# Patient Record
Sex: Female | Born: 1935 | Race: White | Hispanic: No | Smoking: Never smoker
Health system: Southern US, Community
[De-identification: ages and names within clinical notes are randomized; demographics above are authoritative.]

## PROBLEM LIST (undated history)

## (undated) DIAGNOSIS — I219 Acute myocardial infarction, unspecified: Secondary | ICD-10-CM

## (undated) DIAGNOSIS — C8583 Other specified types of non-Hodgkin lymphoma, intra-abdominal lymph nodes: Secondary | ICD-10-CM

## (undated) DIAGNOSIS — M1990 Unspecified osteoarthritis, unspecified site: Secondary | ICD-10-CM

## (undated) DIAGNOSIS — R011 Cardiac murmur, unspecified: Secondary | ICD-10-CM

## (undated) DIAGNOSIS — I499 Cardiac arrhythmia, unspecified: Secondary | ICD-10-CM

## (undated) DIAGNOSIS — I10 Essential (primary) hypertension: Secondary | ICD-10-CM

## (undated) HISTORY — PX: EYE SURGERY: SHX253

## (undated) HISTORY — DX: Other specified types of non-hodgkin lymphoma, intra-abdominal lymph nodes: C85.83

## (undated) HISTORY — PX: CARDIAC CATHETERIZATION: SHX172

## (undated) HISTORY — PX: TONSILLECTOMY: SUR1361

---

## 2012-10-22 DIAGNOSIS — K5790 Diverticulosis of intestine, part unspecified, without perforation or abscess without bleeding: Secondary | ICD-10-CM | POA: Insufficient documentation

## 2012-10-22 DIAGNOSIS — D509 Iron deficiency anemia, unspecified: Secondary | ICD-10-CM | POA: Insufficient documentation

## 2012-10-22 DIAGNOSIS — I4891 Unspecified atrial fibrillation: Secondary | ICD-10-CM | POA: Insufficient documentation

## 2012-10-22 DIAGNOSIS — C4491 Basal cell carcinoma of skin, unspecified: Secondary | ICD-10-CM | POA: Insufficient documentation

## 2012-10-22 DIAGNOSIS — I4819 Other persistent atrial fibrillation: Secondary | ICD-10-CM | POA: Insufficient documentation

## 2013-12-09 ENCOUNTER — Encounter: Payer: Self-pay | Admitting: Hematology & Oncology

## 2013-12-11 ENCOUNTER — Ambulatory Visit (HOSPITAL_BASED_OUTPATIENT_CLINIC_OR_DEPARTMENT_OTHER): Payer: PRIVATE HEALTH INSURANCE | Admitting: Family

## 2013-12-11 ENCOUNTER — Other Ambulatory Visit (HOSPITAL_BASED_OUTPATIENT_CLINIC_OR_DEPARTMENT_OTHER): Payer: PRIVATE HEALTH INSURANCE | Admitting: Lab

## 2013-12-11 ENCOUNTER — Encounter: Payer: Self-pay | Admitting: Family

## 2013-12-11 ENCOUNTER — Telehealth: Payer: Self-pay | Admitting: Hematology & Oncology

## 2013-12-11 ENCOUNTER — Ambulatory Visit: Payer: PRIVATE HEALTH INSURANCE

## 2013-12-11 VITALS — BP 101/66 | HR 97 | Temp 97.9°F | Resp 14 | Ht 60.0 in | Wt 109.0 lb

## 2013-12-11 DIAGNOSIS — C8590 Non-Hodgkin lymphoma, unspecified, unspecified site: Secondary | ICD-10-CM

## 2013-12-11 DIAGNOSIS — C8583 Other specified types of non-Hodgkin lymphoma, intra-abdominal lymph nodes: Secondary | ICD-10-CM

## 2013-12-11 DIAGNOSIS — C884 Extranodal marginal zone B-cell lymphoma of mucosa-associated lymphoid tissue [MALT-lymphoma]: Secondary | ICD-10-CM

## 2013-12-11 DIAGNOSIS — R599 Enlarged lymph nodes, unspecified: Secondary | ICD-10-CM

## 2013-12-11 HISTORY — DX: Other specified types of non-hodgkin lymphoma, intra-abdominal lymph nodes: C85.83

## 2013-12-11 LAB — CBC WITH DIFFERENTIAL (CANCER CENTER ONLY)
BASO#: 0 10*3/uL (ref 0.0–0.2)
BASO%: 0.5 % (ref 0.0–2.0)
EOS%: 5.1 % (ref 0.0–7.0)
Eosinophils Absolute: 0.3 10*3/uL (ref 0.0–0.5)
HEMATOCRIT: 45.7 % (ref 34.8–46.6)
HEMOGLOBIN: 14.9 g/dL (ref 11.6–15.9)
LYMPH#: 1.7 10*3/uL (ref 0.9–3.3)
LYMPH%: 27.1 % (ref 14.0–48.0)
MCH: 29.1 pg (ref 26.0–34.0)
MCHC: 32.6 g/dL (ref 32.0–36.0)
MCV: 89 fL (ref 81–101)
MONO#: 0.5 10*3/uL (ref 0.1–0.9)
MONO%: 7.4 % (ref 0.0–13.0)
NEUT#: 3.8 10*3/uL (ref 1.5–6.5)
NEUT%: 59.9 % (ref 39.6–80.0)
Platelets: 308 10*3/uL (ref 145–400)
RBC: 5.12 10*6/uL (ref 3.70–5.32)
RDW: 14.9 % (ref 11.1–15.7)
WBC: 6.3 10*3/uL (ref 3.9–10.0)

## 2013-12-11 LAB — CMP (CANCER CENTER ONLY)
ALBUMIN: 2.8 g/dL — AB (ref 3.3–5.5)
ALT(SGPT): 8 U/L — ABNORMAL LOW (ref 10–47)
AST: 30 U/L (ref 11–38)
Alkaline Phosphatase: 103 U/L — ABNORMAL HIGH (ref 26–84)
BILIRUBIN TOTAL: 0.6 mg/dL (ref 0.20–1.60)
BUN, Bld: 16 mg/dL (ref 7–22)
CO2: 27 mEq/L (ref 18–33)
Calcium: 8.7 mg/dL (ref 8.0–10.3)
Chloride: 100 mEq/L (ref 98–108)
Creat: 0.9 mg/dl (ref 0.6–1.2)
GLUCOSE: 93 mg/dL (ref 73–118)
Potassium: 4 mEq/L (ref 3.3–4.7)
SODIUM: 142 meq/L (ref 128–145)
TOTAL PROTEIN: 6.1 g/dL — AB (ref 6.4–8.1)

## 2013-12-11 MED ORDER — FAMCICLOVIR 500 MG PO TABS: 500.0000 mg | ORAL_TABLET | Freq: Every day | ORAL | Status: DC

## 2013-12-11 MED ORDER — ALLOPURINOL 100 MG PO TABS: 100.0000 mg | ORAL_TABLET | Freq: Every day | ORAL | Status: DC

## 2013-12-11 NOTE — Patient Instructions (Signed)
Rituximab injection What is this medicine? RITUXIMAB (ri TUX i mab) is a monoclonal antibody. This medicine changes the way the body's immune system works. It is used commonly to treat non-Hodgkin's lymphoma and other conditions. In cancer cells, this drug targets a specific protein within cancer cells and stops the cancer cells from growing. It is also used to treat rhuematoid arthritis (RA). In RA, this medicine slow the inflammatory process and help reduce joint pain and swelling. This medicine is often used with other cancer or arthritis medications. This medicine may be used for other purposes; ask your health care provider or pharmacist if you have questions. COMMON BRAND NAME(S): Rituxan What should I tell my health care provider before I take this medicine? They need to know if you have any of these conditions: -blood disorders -heart disease -history of hepatitis B -infection (especially a virus infection such as chickenpox, cold sores, or herpes) -irregular heartbeat -kidney disease -lung or breathing disease, like asthma -lupus -an unusual or allergic reaction to rituximab, mouse proteins, other medicines, foods, dyes, or preservatives -pregnant or trying to get pregnant -breast-feeding How should I use this medicine? This medicine is for infusion into a vein. It is administered in a hospital or clinic by a specially trained health care professional. A special MedGuide will be given to you by the pharmacist with each prescription and refill. Be sure to read this information carefully each time. Talk to your pediatrician regarding the use of this medicine in children. This medicine is not approved for use in children. Overdosage: If you think you have taken too much of this medicine contact a poison control center or emergency room at once. NOTE: This medicine is only for you. Do not share this medicine with others. What if I miss a dose? It is important not to miss a dose. Call  your doctor or health care professional if you are unable to keep an appointment. What may interact with this medicine? -cisplatin -medicines for blood pressure -some other medicines for arthritis -vaccines This list may not describe all possible interactions. Give your health care provider a list of all the medicines, herbs, non-prescription drugs, or dietary supplements you use. Also tell them if you smoke, drink alcohol, or use illegal drugs. Some items may interact with your medicine. What should I watch for while using this medicine? Report any side effects that you notice during your treatment right away, such as changes in your breathing, fever, chills, dizziness or lightheadedness. These effects are more common with the first dose. Visit your prescriber or health care professional for checks on your progress. You will need to have regular blood work. Report any other side effects. The side effects of this medicine can continue after you finish your treatment. Continue your course of treatment even though you feel ill unless your doctor tells you to stop. Call your doctor or health care professional for advice if you get a fever, chills or sore throat, or other symptoms of a cold or flu. Do not treat yourself. This drug decreases your body's ability to fight infections. Try to avoid being around people who are sick. This medicine may increase your risk to bruise or bleed. Call your doctor or health care professional if you notice any unusual bleeding. Be careful brushing and flossing your teeth or using a toothpick because you may get an infection or bleed more easily. If you have any dental work done, tell your dentist you are receiving this medicine. Avoid taking products   that contain aspirin, acetaminophen, ibuprofen, naproxen, or ketoprofen unless instructed by your doctor. These medicines may hide a fever. Do not become pregnant while taking this medicine. Women should inform their doctor  if they wish to become pregnant or think they might be pregnant. There is a potential for serious side effects to an unborn child. Talk to your health care professional or pharmacist for more information. Do not breast-feed an infant while taking this medicine. What side effects may I notice from receiving this medicine? Side effects that you should report to your doctor or health care professional as soon as possible: -allergic reactions like skin rash, itching or hives, swelling of the face, lips, or tongue -low blood counts - this medicine may decrease the number of white blood cells, red blood cells and platelets. You may be at increased risk for infections and bleeding. -signs of infection - fever or chills, cough, sore throat, pain or difficulty passing urine -signs of decreased platelets or bleeding - bruising, pinpoint red spots on the skin, black, tarry stools, blood in the urine -signs of decreased red blood cells - unusually weak or tired, fainting spells, lightheadedness -breathing problems -confused, not responsive -chest pain -fast, irregular heartbeat -feeling faint or lightheaded, falls -mouth sores -redness, blistering, peeling or loosening of the skin, including inside the mouth -stomach pain -swelling of the ankles, feet, or hands -trouble passing urine or change in the amount of urine Side effects that usually do not require medical attention (report to your doctor or other health care professional if they continue or are bothersome): -anxiety -headache -loss of appetite -muscle aches -nausea -night sweats This list may not describe all possible side effects. Call your doctor for medical advice about side effects. You may report side effects to FDA at 1-800-FDA-1088. Where should I keep my medicine? This drug is given in a hospital or clinic and will not be stored at home. NOTE: This sheet is a summary. It may not cover all possible information. If you have questions  about this medicine, talk to your doctor, pharmacist, or health care provider.  2015, Elsevier/Gold Standard. (2007-09-30 14:04:59) Bendamustine Injection What is this medicine? BENDAMUSTINE (BEN da MUS teen) is a chemotherapy drug. It is used to treat chronic lymphocytic leukemia and non-Hodgkin lymphoma. This medicine may be used for other purposes; ask your health care provider or pharmacist if you have questions. COMMON BRAND NAME(S): Treanda What should I tell my health care provider before I take this medicine? They need to know if you have any of these conditions: -kidney disease -liver disease -an unusual or allergic reaction to bendamustine, mannitol, other medicines, foods, dyes, or preservatives -pregnant or trying to get pregnant -breast-feeding How should I use this medicine? This medicine is for infusion into a vein. It is given by a health care professional in a hospital or clinic setting. Talk to your pediatrician regarding the use of this medicine in children. Special care may be needed. Overdosage: If you think you have taken too much of this medicine contact a poison control center or emergency room at once. NOTE: This medicine is only for you. Do not share this medicine with others. What if I miss a dose? It is important not to miss your dose. Call your doctor or health care professional if you are unable to keep an appointment. What may interact with this medicine? Do not take this medicine with any of the following medications: -clozapine This medicine may also interact with the following medications: -  atazanavir -cimetidine -ciprofloxacin -enoxacin -fluvoxamine -medicines for seizures like carbamazepine and phenobarbital -mexiletine -rifampin -tacrine -thiabendazole -zileuton This list may not describe all possible interactions. Give your health care provider a list of all the medicines, herbs, non-prescription drugs, or dietary supplements you use. Also  tell them if you smoke, drink alcohol, or use illegal drugs. Some items may interact with your medicine. What should I watch for while using this medicine? Your condition will be monitored carefully while you are receiving this medicine. This drug may make you feel generally unwell. This is not uncommon, as chemotherapy can affect healthy cells as well as cancer cells. Report any side effects. Continue your course of treatment even though you feel ill unless your doctor tells you to stop. Call your doctor or health care professional for advice if you get a fever, chills or sore throat, or other symptoms of a cold or flu. Do not treat yourself. This drug decreases your body's ability to fight infections. Try to avoid being around people who are sick. This medicine may increase your risk to bruise or bleed. Call your doctor or health care professional if you notice any unusual bleeding. Be careful brushing and flossing your teeth or using a toothpick because you may get an infection or bleed more easily. If you have any dental work done, tell your dentist you are receiving this medicine. Avoid taking products that contain aspirin, acetaminophen, ibuprofen, naproxen, or ketoprofen unless instructed by your doctor. These medicines may hide a fever. Do not become pregnant while taking this medicine. Women should inform their doctor if they wish to become pregnant or think they might be pregnant. There is a potential for serious side effects to an unborn child. Men should inform their doctors if they wish to father a child. This medicine may lower sperm counts. Talk to your health care professional or pharmacist for more information. Do not breast-feed an infant while taking this medicine. What side effects may I notice from receiving this medicine? Side effects that you should report to your doctor or health care professional as soon as possible: -allergic reactions like skin rash, itching or hives, swelling  of the face, lips, or tongue -low blood counts - this medicine may decrease the number of white blood cells, red blood cells and platelets. You may be at increased risk for infections and bleeding. -signs of infection - fever or chills, cough, sore throat, pain or difficulty passing urine -signs of decreased platelets or bleeding - bruising, pinpoint red spots on the skin, black, tarry stools, blood in the urine -signs of decreased red blood cells - unusually weak or tired, fainting spells, lightheadedness -trouble passing urine or change in the amount of urine Side effects that usually do not require medical attention (report to your doctor or health care professional if they continue or are bothersome): -diarrhea This list may not describe all possible side effects. Call your doctor for medical advice about side effects. You may report side effects to FDA at 1-800-FDA-1088. Where should I keep my medicine? This drug is given in a hospital or clinic and will not be stored at home. NOTE: This sheet is a summary. It may not cover all possible information. If you have questions about this medicine, talk to your doctor, pharmacist, or health care provider.  2015, Elsevier/Gold Standard. (2011-04-28 14:15:47)  

## 2013-12-11 NOTE — Telephone Encounter (Addendum)
I spoke w NEW PATIENT today to remind them of their appointment with Dr. Ennever. Also, advised them to bring all medication bottles and insurance card information. ° °

## 2013-12-11 NOTE — Progress Notes (Signed)
Hematology/Oncology Consultation   Name: Kathy Massey      MRN: [id number]    Location: Room/bed info not found  Date: 12/11/2013 Time:3:47 PM   REFERRING PHYSICIAN:  Raelene Bott  REASON FOR CONSULT:  Marginal Zone Lymphoma  DIAGNOSIS: Marginal Zone Non-Hodgkin Lymphoma  HISTORY OF PRESENT ILLNESS: Kathy Massey is a very pleasant 80 yo female with the resent diagnosis of lymphoma. She was at a regular appointment with her PCP and told him she had noticed a little mid abdominal pain. She had an ultrasound and then aCT to confirm abdominal and pelvic adenopathy and right sided hydronephrosis. She had a PET scan and we are waiting on images and report for this from the imaging center. She had a biopsy after this that confirmed the CD20+B Cell non-Hodgkin lymphoma. 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   She denies fever, chills, n/v, cough, rash, headache, SOB, chest pain, palpitations, abdominal pain, constipation, diarrhea, problems urinating, blood in urine or stool. She has hypertension and has dizziness at times related to that. Cardiology is following her for this. She swelling in her feet periodically and takes Lasix PRN which does help. She has no other swelling, pain, numbness or tingling in her extremities. Her appetite is good and she stays hydrated. Her weight is stable. She states that she is up to date on her mammograms and that they have been fine. She does not smoke. She has atrial fib and is on Eliquis for this. 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 She lives in [place]. Her husband passed away 2 years ago. She is here today with two of her children. They are all very sweet.    ROS: All other 10 point review of systems is negative.   PAST MEDICAL HISTORY:   Past Medical History  Diagnosis Date  . 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   No current facility-administered medications on file prior to visit.   PAST SURGICAL HISTORY No past surgical history on file.  FAMILY HISTORY: No family history on file.  SOCIAL HISTORY:  reports that she has never smoked. She has never used smokeless tobacco. Her alcohol and drug histories are not on file.  PERFORMANCE STATUS: The patient's performance status is 0 - Asymptomatic  PHYSICAL EXAM: Most Recent Vital Signs: Blood pressure 101/66, pulse 97, temperature 97.9 F (36.6 C), temperature source Oral, resp. rate 14, height 5' (1.524 m), weight 109 lb (49.442 kg). BP 101/66  Pulse 97  Temp(Src) 97.9 F (36.6 C) (Oral)  Resp 14  Ht 5' (1.524 m)  Wt 109 lb (49.442 kg)  BMI 21.29 kg/m2  General Appearance:    Alert, cooperative, no distress, appears stated age  Head:    Normocephalic, without obvious abnormality, atraumatic  Eyes:    PERRL, conjunctiva/corneas clear, EOM's intact, fundi    benign, both eyes        Throat:   Lips, mucosa, and tongue normal; teeth and gums normal  Neck:   Supple, symmetrical, trachea midline, no adenopathy;    thyroid:  no enlargement/tenderness/nodules; no carotid   bruit or JVD  Back:     Symmetric, no curvature, ROM normal, no CVA tenderness  Lungs:     Clear to auscultation bilaterally, respirations unlabored  Chest Wall:    No tenderness or deformity   Heart:  Regular rate and rhythm, S1 and S2 normal, no murmur, rub   or gallop     Abdomen:     Soft, non-tender, bowel sounds active all four quadrants,    no masses, no organomegaly        Extremities:   Extremities normal, atraumatic, no cyanosis or edema  Pulses:   2+ and symmetric all extremities  Skin:   Skin color, texture, turgor normal, no rashes or lesions  Lymph nodes:   Cervical, supraclavicular nodes normal, my have slight lymphedema of left axillary node  Neurologic:   CNII-XII intact, normal strength, sensation and reflexes    throughout    LABORATORY DATA:  Results for orders placed in visit on 12/11/13 (from the past 48 hour(s))  CBC WITH DIFFERENTIAL ([facility])     Status: None   Collection Time    12/11/13  1:12 PM      Result Value Ref Range   WBC 6.3  3.9 - 10.0 10e3/uL   RBC 5.12  3.70 - 5.32 10e6/uL   HGB 14.9  11.6 - 15.9 g/dL   HCT 45.7  34.8 - 46.6 %   MCV 89  81 - 101 fL   MCH 29.1  26.0 - 34.0 pg   MCHC 32.6  32.0 - 36.0 g/dL   RDW 14.9  11.1 - 15.7 %   Platelets 308  145 - 400 10e3/uL   NEUT# 3.8  1.5 - 6.5 10e3/uL   LYMPH# 1.7  0.9 - 3.3 10e3/uL   MONO# 0.5  0.1 - 0.9 10e3/uL   Eosinophils Absolute 0.3  0.0 - 0.5 10e3/uL   BASO# 0.0  0.0 - 0.2 10e3/uL   NEUT% 59.9  39.6 - 80.0 %   LYMPH% 27.1  14.0 - 48.0 %   MONO% 7.4  0.0 - 13.0 %   EOS% 5.1  0.0 - 7.0 %   BASO% 0.5  0.0 - 2.0 %  COMPREHENSIVE METABOLIC PANEL ([facility] ONLY)     Status: Abnormal   Collection Time    12/11/13  1:12 PM      Result Value Ref Range   Sodium 142  128 - 145 mEq/L   Potassium 4.0  3.3 - 4.7 mEq/L   Chloride 100  98 - 108 mEq/L   CO2 27  18 - 33 mEq/L   Glucose, Bld 93  73 - 118 mg/dL   BUN, Bld 16  7 - 22 mg/dL   Creat 0.9  0.6 - 1.2 mg/dl   Total Bilirubin 0.60  0.20 - 1.60 mg/dl   Alkaline Phosphatase 103 (*) 26 - 84 U/L   AST 30  11 - 38 U/L   ALT(SGPT) 8 (*) 10 - 47 U/L   Total Protein 6.1 (*) 6.4 - 8.1 g/dL   Albumin 2.8 (*) 3.3 - 5.5 g/dL   Calcium 8.7  8.0 - 10.3 mg/dL      RADIOGRAPHY: No results found.     PATHOLOGY: CD20+B Cell Non-Hodgkin Lymphoma (actual report in scanned documents 12/09/2013).   ASSESSMENT/PLAN: MS. Hord is a very pleasant 80 yo female with the resent diagnosis of CD20+B Cell non-Hodgkin lymphoma. She is asymptomatic at this time. She is no longer having the mid abdominal pain.  Her CBC today was normal.  We will set her up to start Treanda/Rituxan next week. She will have a total of 6-8 treatments with a repeat PET scan after her second treatment.    She will  be scheduled for  chemo class.  We will go ahead and start her on Famvir and Allopurinol today. She will get her treatment and appointment schedule.   All questions were answered. She knows to call the clinic with any problems, questions or concerns. We can certainly see her much sooner if necessary. The patient was discussed with and also seen by Dr. Marin Olp and he is in agreement with the aforementioned.   [facility]    Addendum:   I saw and examined the patient with Kierah Goatley. We spent about an hour with them. She came in with her son and daughter. I explained to them her diagnosis. She has marginal zone lymphoma. She has fairly bulky disease in the abdomen. This is a little unusual for marginal zone lymphoma. She apparently had a PET scan done. We do not have these results. From what the family says, there was low-level involvement.  Being that this is a low-grade lymphoma, I think that we can treat her very well with Rituxan/Treanda. I'll the she will need full dose Treanda given her age and the histology of the lymphoma.  I don't think a bone marrow biopsy is necessary as this really will not change our prognosis or management plans.  I explained the side effects of chemotherapy. I went over the hospital the of fatigue, nausea, diarrhea, risk of infection. Her blood counts may go down a little bit. However, I don't think that they should drop significantly that would increase her risk of bacterial infection.  I think that Rituxan/Treanda she have a 90% effectiveness rate. After her second cycle of treatment, I would do a PET scan to see what type of response she has had.  She definitely needs the allopurinol and Famvir. Output are on 100 mg a day of allopurinol for 21 days. I would put her on 500 mg a day of Famvir.  I looked at her blood smear. I do not see anything that looked unusual. There were no nuclear red cells. There were no immature myeloid cells. There were no atypical  lymphocytes.  She has great peripheral veins so she does not need a Port-A-Cath.  On her exam, she has some fullness in the left axilla. There is no cervical or supraclavicular lymph nodes. I cannot palpate any lymph nodes in the right axilla. She had clear lungs. Cardiac exam is regular rate and rhythm. I cannot detect atrial fibrillation. Her abdominal exam was soft. There is no splenomegaly..I cannot palpate her liver tip. Extremities shows some mild, chronic nonpitting edema.  We spent an hour with Mrs. Angus in her family. I answered all their questions. We gave her a prayer blanket.  We will get started with treatment next week.

## 2013-12-12 ENCOUNTER — Encounter: Payer: Self-pay | Admitting: Hematology & Oncology

## 2013-12-15 ENCOUNTER — Other Ambulatory Visit: Payer: PRIVATE HEALTH INSURANCE

## 2013-12-15 LAB — BETA 2 MICROGLOBULIN, SERUM: Beta-2 Microglobulin: 4.25 mg/L — ABNORMAL HIGH (ref <=2.51)

## 2013-12-15 LAB — LACTATE DEHYDROGENASE: LDH: 174 U/L (ref 94–250)

## 2013-12-17 ENCOUNTER — Ambulatory Visit (HOSPITAL_BASED_OUTPATIENT_CLINIC_OR_DEPARTMENT_OTHER): Payer: PRIVATE HEALTH INSURANCE

## 2013-12-17 DIAGNOSIS — Z5111 Encounter for antineoplastic chemotherapy: Secondary | ICD-10-CM

## 2013-12-17 DIAGNOSIS — C8583 Other specified types of non-Hodgkin lymphoma, intra-abdominal lymph nodes: Secondary | ICD-10-CM

## 2013-12-17 DIAGNOSIS — Z5112 Encounter for antineoplastic immunotherapy: Secondary | ICD-10-CM

## 2013-12-17 DIAGNOSIS — C8518 Unspecified B-cell lymphoma, lymph nodes of multiple sites: Secondary | ICD-10-CM

## 2013-12-17 MED ORDER — ACETAMINOPHEN 325 MG PO TABS
ORAL_TABLET | ORAL | Status: AC
  Filled 2013-12-17: qty 2

## 2013-12-17 MED ORDER — SODIUM CHLORIDE 0.9 % IV SOLN
375.0000 mg/m2 | Freq: Once | INTRAVENOUS | Status: AC
  Administered 2013-12-17: 500 mg via INTRAVENOUS
  Filled 2013-12-17: qty 50

## 2013-12-17 MED ORDER — PROCHLORPERAZINE MALEATE 10 MG PO TABS: 10.0000 mg | ORAL_TABLET | Freq: Four times a day (QID) | ORAL | Status: DC | PRN

## 2013-12-17 MED ORDER — DEXAMETHASONE 4 MG PO TABS: 8.0000 mg | ORAL_TABLET | Freq: Two times a day (BID) | ORAL | Status: DC

## 2013-12-17 MED ORDER — DEXAMETHASONE SODIUM PHOSPHATE 10 MG/ML IJ SOLN
INTRAMUSCULAR | Status: AC
  Filled 2013-12-17: qty 1

## 2013-12-17 MED ORDER — DIPHENHYDRAMINE HCL 25 MG PO CAPS
50.0000 mg | ORAL_CAPSULE | Freq: Once | ORAL | Status: AC
  Administered 2013-12-17: 50 mg via ORAL

## 2013-12-17 MED ORDER — SODIUM CHLORIDE 0.9 % IV SOLN
70.0000 mg/m2 | Freq: Once | INTRAVENOUS | Status: AC
  Administered 2013-12-17: 99 mg via INTRAVENOUS
  Filled 2013-12-17: qty 1.1

## 2013-12-17 MED ORDER — DEXAMETHASONE SODIUM PHOSPHATE 10 MG/ML IJ SOLN
10.0000 mg | Freq: Once | INTRAMUSCULAR | Status: AC
  Administered 2013-12-17: 10 mg via INTRAVENOUS

## 2013-12-17 MED ORDER — ONDANSETRON HCL 8 MG PO TABS: 8.0000 mg | ORAL_TABLET | Freq: Two times a day (BID) | ORAL | Status: DC

## 2013-12-17 MED ORDER — ONDANSETRON 8 MG/50ML IVPB (CHCC)
8.0000 mg | Freq: Once | INTRAVENOUS | Status: AC
  Administered 2013-12-17: 8 mg via INTRAVENOUS

## 2013-12-17 MED ORDER — ACETAMINOPHEN 325 MG PO TABS
650.0000 mg | ORAL_TABLET | Freq: Once | ORAL | Status: AC
  Administered 2013-12-17: 650 mg via ORAL

## 2013-12-17 MED ORDER — DIPHENHYDRAMINE HCL 25 MG PO CAPS
ORAL_CAPSULE | ORAL | Status: AC
  Filled 2013-12-17: qty 2

## 2013-12-17 MED ORDER — SODIUM CHLORIDE 0.9 % IV SOLN
Freq: Once | INTRAVENOUS | Status: AC
  Administered 2013-12-17: 09:00:00 via INTRAVENOUS

## 2013-12-17 NOTE — Patient Instructions (Signed)
[facility] Discharge Instructions for Patients Receiving Chemotherapy  Today you received the following chemotherapy agents Rituxan and Treanda.   To help prevent nausea and vomiting after your treatment, we encourage you to take your nausea medications as directed.   If you develop nausea and vomiting that is not controlled by your nausea medication, call the clinic.   BELOW ARE SYMPTOMS THAT SHOULD BE REPORTED IMMEDIATELY:  *FEVER GREATER THAN 100.5 F  *CHILLS WITH OR WITHOUT FEVER  NAUSEA AND VOMITING THAT IS NOT CONTROLLED WITH YOUR NAUSEA MEDICATION  *UNUSUAL SHORTNESS OF BREATH  *UNUSUAL BRUISING OR BLEEDING  TENDERNESS IN MOUTH AND THROAT WITH OR WITHOUT PRESENCE OF ULCERS  *URINARY PROBLEMS  *BOWEL PROBLEMS  UNUSUAL RASH Items with * indicate a potential emergency and should be followed up as soon as possible.  Feel free to call the clinic you have any questions or concerns. The clinic phone number is [phone] Bendamustine Injection What is this medicine? BENDAMUSTINE (BEN da MUS teen) is a chemotherapy drug. It is used to treat chronic lymphocytic leukemia and non-Hodgkin lymphoma. This medicine may be used for other purposes; ask your health care provider or pharmacist if you have questions. COMMON BRAND NAME(S): Treanda What should I tell my health care provider before I take this medicine? They need to know if you have any of these conditions: -kidney disease -liver disease -an unusual or allergic reaction to bendamustine, mannitol, other medicines, foods, dyes, or preservatives -pregnant or trying to get pregnant -breast-feeding How should I use this medicine? This medicine is for infusion into a vein. It is given by a health care professional in a hospital or clinic setting. Talk to your pediatrician regarding the use of this medicine in children. Special care may be needed. Overdosage: If you think you have taken too much of this  medicine contact a poison control center or emergency room at once. NOTE: This medicine is only for you. Do not share this medicine with others. What if I miss a dose? It is important not to miss your dose. Call your doctor or health care professional if you are unable to keep an appointment. What may interact with this medicine? Do not take this medicine with any of the following medications: -clozapine This medicine may also interact with the following medications: -atazanavir -cimetidine -ciprofloxacin -enoxacin -fluvoxamine -medicines for seizures like carbamazepine and phenobarbital -mexiletine -rifampin -tacrine -thiabendazole -zileuton This list may not describe all possible interactions. Give your health care provider a list of all the medicines, herbs, non-prescription drugs, or dietary supplements you use. Also tell them if you smoke, drink alcohol, or use illegal drugs. Some items may interact with your medicine. What should I watch for while using this medicine? Your condition will be monitored carefully while you are receiving this medicine. This drug may make you feel generally unwell. This is not uncommon, as chemotherapy can affect healthy cells as well as cancer cells. Report any side effects. Continue your course of treatment even though you feel ill unless your doctor tells you to stop. Call your doctor or health care professional for advice if you get a fever, chills or sore throat, or other symptoms of a cold or flu. Do not treat yourself. This drug decreases your body's ability to fight infections. Try to avoid being around people who are sick. This medicine may increase your risk to bruise or bleed. Call your doctor or health care professional if you notice any unusual bleeding. Be careful brushing  and flossing your teeth or using a toothpick because you may get an infection or bleed more easily. If you have any dental work done, tell your dentist you are receiving  this medicine. Avoid taking products that contain aspirin, acetaminophen, ibuprofen, naproxen, or ketoprofen unless instructed by your doctor. These medicines may hide a fever. Do not become pregnant while taking this medicine. Women should inform their doctor if they wish to become pregnant or think they might be pregnant. There is a potential for serious side effects to an unborn child. Men should inform their doctors if they wish to father a child. This medicine may lower sperm counts. Talk to your health care professional or pharmacist for more information. Do not breast-feed an infant while taking this medicine. What side effects may I notice from receiving this medicine? Side effects that you should report to your doctor or health care professional as soon as possible: -allergic reactions like skin rash, itching or hives, swelling of the face, lips, or tongue -low blood counts - this medicine may decrease the number of white blood cells, red blood cells and platelets. You may be at increased risk for infections and bleeding. -signs of infection - fever or chills, cough, sore throat, pain or difficulty passing urine -signs of decreased platelets or bleeding - bruising, pinpoint red spots on the skin, black, tarry stools, blood in the urine -signs of decreased red blood cells - unusually weak or tired, fainting spells, lightheadedness -trouble passing urine or change in the amount of urine Side effects that usually do not require medical attention (report to your doctor or health care professional if they continue or are bothersome): -diarrhea This list may not describe all possible side effects. Call your doctor for medical advice about side effects. You may report side effects to FDA at 1-800-FDA-1088. Where should I keep my medicine? This drug is given in a hospital or clinic and will not be stored at home. NOTE: This sheet is a summary. It may not cover all possible information. If you have  questions about this medicine, talk to your doctor, pharmacist, or health care provider.  2015, Elsevier/Gold Standard. (2011-04-28 14:15:47) Rituximab injection What is this medicine? RITUXIMAB (ri TUX i mab) is a monoclonal antibody. This medicine changes the way the body's immune system works. It is used commonly to treat non-Hodgkin's lymphoma and other conditions. In cancer cells, this drug targets a specific protein within cancer cells and stops the cancer cells from growing. It is also used to treat rhuematoid arthritis (RA). In RA, this medicine slow the inflammatory process and help reduce joint pain and swelling. This medicine is often used with other cancer or arthritis medications. This medicine may be used for other purposes; ask your health care provider or pharmacist if you have questions. COMMON BRAND NAME(S): Rituxan What should I tell my health care provider before I take this medicine? They need to know if you have any of these conditions: -blood disorders -heart disease -history of hepatitis B -infection (especially a virus infection such as chickenpox, cold sores, or herpes) -irregular heartbeat -kidney disease -lung or breathing disease, like asthma -lupus -an unusual or allergic reaction to rituximab, mouse proteins, other medicines, foods, dyes, or preservatives -pregnant or trying to get pregnant -breast-feeding How should I use this medicine? This medicine is for infusion into a vein. It is administered in a hospital or clinic by a specially trained health care professional. A special MedGuide will be given to you by the  pharmacist with each prescription and refill. Be sure to read this information carefully each time. Talk to your pediatrician regarding the use of this medicine in children. This medicine is not approved for use in children. Overdosage: If you think you have taken too much of this medicine contact a poison control center or emergency room at  once. NOTE: This medicine is only for you. Do not share this medicine with others. What if I miss a dose? It is important not to miss a dose. Call your doctor or health care professional if you are unable to keep an appointment. What may interact with this medicine? -cisplatin -medicines for blood pressure -some other medicines for arthritis -vaccines This list may not describe all possible interactions. Give your health care provider a list of all the medicines, herbs, non-prescription drugs, or dietary supplements you use. Also tell them if you smoke, drink alcohol, or use illegal drugs. Some items may interact with your medicine. What should I watch for while using this medicine? Report any side effects that you notice during your treatment right away, such as changes in your breathing, fever, chills, dizziness or lightheadedness. These effects are more common with the first dose. Visit your prescriber or health care professional for checks on your progress. You will need to have regular blood work. Report any other side effects. The side effects of this medicine can continue after you finish your treatment. Continue your course of treatment even though you feel ill unless your doctor tells you to stop. Call your doctor or health care professional for advice if you get a fever, chills or sore throat, or other symptoms of a cold or flu. Do not treat yourself. This drug decreases your body's ability to fight infections. Try to avoid being around people who are sick. This medicine may increase your risk to bruise or bleed. Call your doctor or health care professional if you notice any unusual bleeding. Be careful brushing and flossing your teeth or using a toothpick because you may get an infection or bleed more easily. If you have any dental work done, tell your dentist you are receiving this medicine. Avoid taking products that contain aspirin, acetaminophen, ibuprofen, naproxen, or ketoprofen  unless instructed by your doctor. These medicines may hide a fever. Do not become pregnant while taking this medicine. Women should inform their doctor if they wish to become pregnant or think they might be pregnant. There is a potential for serious side effects to an unborn child. Talk to your health care professional or pharmacist for more information. Do not breast-feed an infant while taking this medicine. What side effects may I notice from receiving this medicine? Side effects that you should report to your doctor or health care professional as soon as possible: -allergic reactions like skin rash, itching or hives, swelling of the face, lips, or tongue -low blood counts - this medicine may decrease the number of white blood cells, red blood cells and platelets. You may be at increased risk for infections and bleeding. -signs of infection - fever or chills, cough, sore throat, pain or difficulty passing urine -signs of decreased platelets or bleeding - bruising, pinpoint red spots on the skin, black, tarry stools, blood in the urine -signs of decreased red blood cells - unusually weak or tired, fainting spells, lightheadedness -breathing problems -confused, not responsive -chest pain -fast, irregular heartbeat -feeling faint or lightheaded, falls -mouth sores -redness, blistering, peeling or loosening of the skin, including inside the mouth -stomach pain -  swelling of the ankles, feet, or hands -trouble passing urine or change in the amount of urine Side effects that usually do not require medical attention (report to your doctor or other health care professional if they continue or are bothersome): -anxiety -headache -loss of appetite -muscle aches -nausea -night sweats This list may not describe all possible side effects. Call your doctor for medical advice about side effects. You may report side effects to FDA at 1-800-FDA-1088. Where should I keep my medicine? This drug is given  in a hospital or clinic and will not be stored at home. NOTE: This sheet is a summary. It may not cover all possible information. If you have questions about this medicine, talk to your doctor, pharmacist, or health care provider.  2015, Elsevier/Gold Standard. (2007-09-30 14:04:59) .

## 2013-12-18 ENCOUNTER — Encounter: Payer: Self-pay | Admitting: Hematology & Oncology

## 2013-12-18 ENCOUNTER — Other Ambulatory Visit: Payer: Self-pay | Admitting: *Deleted

## 2013-12-18 ENCOUNTER — Ambulatory Visit (HOSPITAL_BASED_OUTPATIENT_CLINIC_OR_DEPARTMENT_OTHER): Payer: PRIVATE HEALTH INSURANCE

## 2013-12-18 DIAGNOSIS — C8518 Unspecified B-cell lymphoma, lymph nodes of multiple sites: Secondary | ICD-10-CM

## 2013-12-18 DIAGNOSIS — Z5111 Encounter for antineoplastic chemotherapy: Secondary | ICD-10-CM

## 2013-12-18 DIAGNOSIS — C8583 Other specified types of non-Hodgkin lymphoma, intra-abdominal lymph nodes: Secondary | ICD-10-CM

## 2013-12-18 MED ORDER — DEXAMETHASONE SODIUM PHOSPHATE 10 MG/ML IJ SOLN
10.0000 mg | Freq: Once | INTRAMUSCULAR | Status: AC
  Administered 2013-12-18: 10 mg via INTRAVENOUS

## 2013-12-18 MED ORDER — MIRTAZAPINE 30 MG PO TABS: 15.0000 mg | ORAL_TABLET | Freq: Every evening | ORAL | Status: DC | PRN

## 2013-12-18 MED ORDER — SODIUM CHLORIDE 0.9 % IV SOLN
70.0000 mg/m2 | Freq: Once | INTRAVENOUS | Status: AC
  Administered 2013-12-18: 99 mg via INTRAVENOUS
  Filled 2013-12-18: qty 1.1

## 2013-12-18 MED ORDER — SODIUM CHLORIDE 0.9 % IV SOLN
Freq: Once | INTRAVENOUS | Status: AC
  Administered 2013-12-18: 11:00:00 via INTRAVENOUS

## 2013-12-18 MED ORDER — ONDANSETRON 8 MG/50ML IVPB (CHCC)
8.0000 mg | Freq: Once | INTRAVENOUS | Status: AC
  Administered 2013-12-18: 8 mg via INTRAVENOUS

## 2013-12-18 MED ORDER — DEXAMETHASONE SODIUM PHOSPHATE 10 MG/ML IJ SOLN
INTRAMUSCULAR | Status: AC
  Filled 2013-12-18: qty 1

## 2013-12-18 NOTE — Patient Instructions (Signed)
[facility] Discharge Instructions for Patients Receiving Chemotherapy  Today you received the following chemotherapy agents Treanda.  To help prevent nausea and vomiting after your treatment, we encourage you to take your nausea medication.   If you develop nausea and vomiting that is not controlled by your nausea medication, call the clinic.   BELOW ARE SYMPTOMS THAT SHOULD BE REPORTED IMMEDIATELY:  *FEVER GREATER THAN 100.5 F  *CHILLS WITH OR WITHOUT FEVER  NAUSEA AND VOMITING THAT IS NOT CONTROLLED WITH YOUR NAUSEA MEDICATION  *UNUSUAL SHORTNESS OF BREATH  *UNUSUAL BRUISING OR BLEEDING  TENDERNESS IN MOUTH AND THROAT WITH OR WITHOUT PRESENCE OF ULCERS  *URINARY PROBLEMS  *BOWEL PROBLEMS  UNUSUAL RASH Items with * indicate a potential emergency and should be followed up as soon as possible.  Feel free to call the clinic you have any questions or concerns. The clinic phone number is [phone].    

## 2014-01-07 ENCOUNTER — Other Ambulatory Visit: Payer: Self-pay | Admitting: *Deleted

## 2014-01-14 ENCOUNTER — Encounter: Payer: Self-pay | Admitting: Hematology & Oncology

## 2014-01-14 ENCOUNTER — Telehealth: Payer: Self-pay | Admitting: Hematology & Oncology

## 2014-01-14 ENCOUNTER — Ambulatory Visit (HOSPITAL_BASED_OUTPATIENT_CLINIC_OR_DEPARTMENT_OTHER): Payer: PRIVATE HEALTH INSURANCE | Admitting: Hematology & Oncology

## 2014-01-14 ENCOUNTER — Ambulatory Visit (HOSPITAL_BASED_OUTPATIENT_CLINIC_OR_DEPARTMENT_OTHER): Payer: PRIVATE HEALTH INSURANCE

## 2014-01-14 ENCOUNTER — Other Ambulatory Visit (HOSPITAL_BASED_OUTPATIENT_CLINIC_OR_DEPARTMENT_OTHER): Payer: PRIVATE HEALTH INSURANCE | Admitting: Lab

## 2014-01-14 VITALS — BP 120/91 | HR 111 | Temp 97.5°F | Resp 14 | Ht 60.0 in | Wt 110.0 lb

## 2014-01-14 DIAGNOSIS — Z5111 Encounter for antineoplastic chemotherapy: Secondary | ICD-10-CM

## 2014-01-14 DIAGNOSIS — C8583 Other specified types of non-Hodgkin lymphoma, intra-abdominal lymph nodes: Secondary | ICD-10-CM

## 2014-01-14 DIAGNOSIS — C8590 Non-Hodgkin lymphoma, unspecified, unspecified site: Secondary | ICD-10-CM

## 2014-01-14 DIAGNOSIS — Z5112 Encounter for antineoplastic immunotherapy: Secondary | ICD-10-CM

## 2014-01-14 LAB — CMP (CANCER CENTER ONLY)
ALT(SGPT): 9 U/L — ABNORMAL LOW (ref 10–47)
AST: 26 U/L (ref 11–38)
Albumin: 3 g/dL — ABNORMAL LOW (ref 3.3–5.5)
Alkaline Phosphatase: 86 U/L — ABNORMAL HIGH (ref 26–84)
BUN, Bld: 18 mg/dL (ref 7–22)
CHLORIDE: 108 meq/L (ref 98–108)
CO2: 27 meq/L (ref 18–33)
CREATININE: 1.1 mg/dL (ref 0.6–1.2)
Calcium: 8.9 mg/dL (ref 8.0–10.3)
Glucose, Bld: 109 mg/dL (ref 73–118)
Potassium: 4.2 mEq/L (ref 3.3–4.7)
Sodium: 143 mEq/L (ref 128–145)
Total Bilirubin: 0.5 mg/dl (ref 0.20–1.60)
Total Protein: 6.5 g/dL (ref 6.4–8.1)

## 2014-01-14 LAB — CBC WITH DIFFERENTIAL (CANCER CENTER ONLY)
BASO#: 0.1 10*3/uL (ref 0.0–0.2)
BASO%: 1.5 % (ref 0.0–2.0)
EOS ABS: 0.2 10*3/uL (ref 0.0–0.5)
EOS%: 5.2 % (ref 0.0–7.0)
HCT: 44.1 % (ref 34.8–46.6)
HGB: 14.6 g/dL (ref 11.6–15.9)
LYMPH#: 0.8 10*3/uL — ABNORMAL LOW (ref 0.9–3.3)
LYMPH%: 20.1 % (ref 14.0–48.0)
MCH: 29.6 pg (ref 26.0–34.0)
MCHC: 33.1 g/dL (ref 32.0–36.0)
MCV: 90 fL (ref 81–101)
MONO#: 0.4 10*3/uL (ref 0.1–0.9)
MONO%: 9.1 % (ref 0.0–13.0)
NEUT#: 2.6 10*3/uL (ref 1.5–6.5)
NEUT%: 64.1 % (ref 39.6–80.0)
PLATELETS: 299 10*3/uL (ref 145–400)
RBC: 4.93 10*6/uL (ref 3.70–5.32)
RDW: 14.9 % (ref 11.1–15.7)
WBC: 4.1 10*3/uL (ref 3.9–10.0)

## 2014-01-14 LAB — LACTATE DEHYDROGENASE: LDH: 150 U/L (ref 94–250)

## 2014-01-14 LAB — URIC ACID: Uric Acid, Serum: 5.3 mg/dL (ref 2.4–7.0)

## 2014-01-14 MED ORDER — DIPHENHYDRAMINE HCL 25 MG PO CAPS
ORAL_CAPSULE | ORAL | Status: AC
  Filled 2014-01-14: qty 2

## 2014-01-14 MED ORDER — DIPHENHYDRAMINE HCL 25 MG PO CAPS
50.0000 mg | ORAL_CAPSULE | Freq: Once | ORAL | Status: AC
  Administered 2014-01-14: 50 mg via ORAL

## 2014-01-14 MED ORDER — DEXAMETHASONE SODIUM PHOSPHATE 10 MG/ML IJ SOLN
10.0000 mg | Freq: Once | INTRAMUSCULAR | Status: AC
  Administered 2014-01-14: 10 mg via INTRAVENOUS

## 2014-01-14 MED ORDER — ACETAMINOPHEN 325 MG PO TABS
650.0000 mg | ORAL_TABLET | Freq: Once | ORAL | Status: AC
  Administered 2014-01-14: 650 mg via ORAL

## 2014-01-14 MED ORDER — SODIUM CHLORIDE 0.9 % IV SOLN
375.0000 mg/m2 | Freq: Once | INTRAVENOUS | Status: AC
  Administered 2014-01-14: 500 mg via INTRAVENOUS
  Filled 2014-01-14: qty 50

## 2014-01-14 MED ORDER — ACETAMINOPHEN 325 MG PO TABS
ORAL_TABLET | ORAL | Status: AC
  Filled 2014-01-14: qty 2

## 2014-01-14 MED ORDER — SODIUM CHLORIDE 0.9 % IV SOLN
70.0000 mg/m2 | Freq: Once | INTRAVENOUS | Status: AC
  Administered 2014-01-14: 99 mg via INTRAVENOUS
  Filled 2014-01-14: qty 1.1

## 2014-01-14 MED ORDER — ONDANSETRON 8 MG/50ML IVPB (CHCC)
8.0000 mg | Freq: Once | INTRAVENOUS | Status: AC
  Administered 2014-01-14: 8 mg via INTRAVENOUS

## 2014-01-14 MED ORDER — DEXAMETHASONE SODIUM PHOSPHATE 10 MG/ML IJ SOLN
INTRAMUSCULAR | Status: AC
  Filled 2014-01-14: qty 1

## 2014-01-14 MED ORDER — SODIUM CHLORIDE 0.9 % IV SOLN
Freq: Once | INTRAVENOUS | Status: AC
  Administered 2014-01-14: 10:00:00 via INTRAVENOUS

## 2014-01-14 NOTE — Progress Notes (Signed)
Hematology and Oncology Follow Up Visit  Kyisha Fowle [id number] April 24, 1927 80 y.o. 01/14/2014   Principle Diagnosis:   Marginal zone lymphoma  Current Therapy:    Status post cycle 1 of Rituxan/Treanda     Interim History:  Ms.  Liddicoat is back for follow-up. She had he tolerated her first cycle quite well. She still has some abdominal discomfort. I think this might be more related to the treatment then the actual lymphoma. I suspect that her lymphoma should be improving nicely.  She's had very little, if any, of nausea vomiting. She does have atrial fibrillation. She is on ELIQUIS for this. There has been no bleeding. She's had no diarrhea. She's had no leg swelling. She has numerous skin lesions which look like squamous cell or basal cell carcinomas. I told her that she can have these taken off at any time.  There's been no fever. She's had no cough. There is no increased shortness of breath.  Medications: Current outpatient prescriptions: apixaban (ELIQUIS) 2.5 MG TABS tablet, Take 2.5 mg by mouth 2 (two) times daily. , Disp: , Rfl: ;  dexamethasone (DECADRON) 4 MG tablet, Take 2 tablets (8 mg total) by mouth 2 (two) times daily with a meal. Start the day after chemotherapy for 2 days. Take with food., Disp: 30 tablet, Rfl: 1;  famciclovir (FAMVIR) 500 MG tablet, Take 1 tablet (500 mg total) by mouth daily., Disp: 30 tablet, Rfl: 6 metoprolol succinate (TOPROL-XL) 25 MG 24 hr tablet, Take 25 mg by mouth 2 (two) times daily. , Disp: , Rfl: ;  mirtazapine (REMERON) 30 MG tablet, Take 0.5 tablets (15 mg total) by mouth at bedtime as needed., Disp: 5 tablet, Rfl: 0;  Multiple Vitamin (MULTI-VITAMINS) TABS, Take by mouth every morning. , Disp: , Rfl: ;  omeprazole (PRILOSEC) 40 MG capsule, Take 40 mg by mouth daily. PRN, Disp: , Rfl:  ondansetron (ZOFRAN) 8 MG tablet, Take 1 tablet (8 mg total) by mouth 2 (two) times daily. Start the day after chemo for 2 days. Then take as needed for nausea or  vomiting., Disp: 30 tablet, Rfl: 1;  prochlorperazine (COMPAZINE) 10 MG tablet, Take 1 tablet (10 mg total) by mouth every 6 (six) hours as needed (Nausea or vomiting)., Disp: 30 tablet, Rfl: 1 allopurinol (ZYLOPRIM) 100 MG tablet, Take 1 tablet (100 mg total) by mouth daily. (Patient not taking: Reported on 01/14/2014), Disp: 21 tablet, Rfl: 0;  ferrous sulfate 325 (65 FE) MG tablet, Take 325 mg by mouth daily with breakfast. , Disp: , Rfl:   Allergies: No Known Allergies  Past Medical History, Surgical history, Social history, and Family History were reviewed and updated.  Review of Systems: As above  Physical Exam:  height is 5' (1.524 m) and weight is 110 lb (49.896 kg). Her oral temperature is 97.5 F (36.4 C). Her blood pressure is 120/91 and her pulse is 111. Her respiration is 14.   Elderly, petite white female in no obvious distress. Head and neck exam shows no ocular or oral lesions. There are no palpable cervical or supraclavicular lymph nodes. Lungs are clear. Cardiac exam tachycardic and irregular, consistent with atrophic fibrillation. There are no murmurs, rubs or bruits. Abdomen is soft. She has good bowel sounds. There is no fluid wave. There is no palpable abdominal mass. There is no palpable liver or spleen tip. Back exam shows no tenderness over the spine, ribs or hips. She has some slight kyphosis. Extremities shows no clubbing, cyanosis or edema. She  has age related osteo-arthritic changes in her joints. She has decent strength in her extremities. Skin exam shows scattered areas that appear to be consistent with squamous cell or basal cell carcinomas. Neurological exam is nonfocal.  Lab Results  Component Value Date   WBC 4.1 01/14/2014   HGB 14.6 01/14/2014   HCT 44.1 01/14/2014   MCV 90 01/14/2014   PLT 299 01/14/2014     Chemistry      Component Value Date/Time   NA 142 12/11/2013 1312   K 4.0 12/11/2013 1312   CL 100 12/11/2013 1312   CO2 27 12/11/2013 1312    BUN 16 12/11/2013 1312   CREATININE 0.9 12/11/2013 1312      Component Value Date/Time   CALCIUM 8.7 12/11/2013 1312   ALKPHOS 103* 12/11/2013 1312   AST 30 12/11/2013 1312   ALT 8* 12/11/2013 1312   BILITOT 0.60 12/11/2013 1312         Impression and Plan: Ms. Bucks is 80 year old white female. She has marginal zone lymphoma. She has abdominal disease that is fairly extensive.  We will proceed with her second cycle of treatment. I will then set her up with a PET scan. We will do this right after Christmas.  One of her granddaughters came in. She was very nice and very helpful.  We'll have this Haupt come back after New Year for treatment.   I will like to think that if this scan was good, then we can just go with 6 cycles of treatment. She may need maintenance Rituxan but we will decide this later.  [name], MD 12/2/20159:33 AM

## 2014-01-14 NOTE — Telephone Encounter (Signed)
Schedule 12-29 PET at [facility] with Della at 10 am. Pt aware, and to be NPO 6 hrs

## 2014-01-14 NOTE — Patient Instructions (Signed)
[facility] Discharge Instructions for Patients Receiving Chemotherapy  Today you received the following chemotherapy agents Rituxan and Treanda. To help prevent nausea and vomiting after your treatment, we encourage you to take your nausea medication as prescribed.   If you develop nausea and vomiting that is not controlled by your nausea medication, call the clinic.   BELOW ARE SYMPTOMS THAT SHOULD BE REPORTED IMMEDIATELY:  *FEVER GREATER THAN 100.5 F  *CHILLS WITH OR WITHOUT FEVER  NAUSEA AND VOMITING THAT IS NOT CONTROLLED WITH YOUR NAUSEA MEDICATION  *UNUSUAL SHORTNESS OF BREATH  *UNUSUAL BRUISING OR BLEEDING  TENDERNESS IN MOUTH AND THROAT WITH OR WITHOUT PRESENCE OF ULCERS  *URINARY PROBLEMS  *BOWEL PROBLEMS  UNUSUAL RASH Items with * indicate a potential emergency and should be followed up as soon as possible.  Feel free to call the clinic you have any questions or concerns. The clinic phone number is [phone].    

## 2014-01-15 ENCOUNTER — Ambulatory Visit (HOSPITAL_BASED_OUTPATIENT_CLINIC_OR_DEPARTMENT_OTHER): Payer: PRIVATE HEALTH INSURANCE

## 2014-01-15 ENCOUNTER — Ambulatory Visit: Payer: PRIVATE HEALTH INSURANCE

## 2014-01-15 DIAGNOSIS — Z5111 Encounter for antineoplastic chemotherapy: Secondary | ICD-10-CM

## 2014-01-15 DIAGNOSIS — C8518 Unspecified B-cell lymphoma, lymph nodes of multiple sites: Secondary | ICD-10-CM

## 2014-01-15 DIAGNOSIS — C8583 Other specified types of non-Hodgkin lymphoma, intra-abdominal lymph nodes: Secondary | ICD-10-CM

## 2014-01-15 MED ORDER — ONDANSETRON 8 MG/50ML IVPB (CHCC)
8.0000 mg | Freq: Once | INTRAVENOUS | Status: AC
  Administered 2014-01-15: 8 mg via INTRAVENOUS

## 2014-01-15 MED ORDER — SODIUM CHLORIDE 0.9 % IV SOLN
Freq: Once | INTRAVENOUS | Status: AC
  Administered 2014-01-15: 11:00:00 via INTRAVENOUS

## 2014-01-15 MED ORDER — DEXAMETHASONE SODIUM PHOSPHATE 10 MG/ML IJ SOLN
INTRAMUSCULAR | Status: AC
  Filled 2014-01-15: qty 1

## 2014-01-15 MED ORDER — DEXAMETHASONE SODIUM PHOSPHATE 10 MG/ML IJ SOLN
10.0000 mg | Freq: Once | INTRAMUSCULAR | Status: AC
  Administered 2014-01-15: 10 mg via INTRAVENOUS

## 2014-01-15 MED ORDER — SODIUM CHLORIDE 0.9 % IV SOLN
70.0000 mg/m2 | Freq: Once | INTRAVENOUS | Status: AC
  Administered 2014-01-15: 99 mg via INTRAVENOUS
  Filled 2014-01-15: qty 1.1

## 2014-01-15 NOTE — Patient Instructions (Signed)
Bendamustine Injection What is this medicine? BENDAMUSTINE (BEN da MUS teen) is a chemotherapy drug. It is used to treat chronic lymphocytic leukemia and non-Hodgkin lymphoma. This medicine may be used for other purposes; ask your health care provider or pharmacist if you have questions. COMMON BRAND NAME(S): Treanda What should I tell my health care provider before I take this medicine? They need to know if you have any of these conditions: -kidney disease -liver disease -an unusual or allergic reaction to bendamustine, mannitol, other medicines, foods, dyes, or preservatives -pregnant or trying to get pregnant -breast-feeding How should I use this medicine? This medicine is for infusion into a vein. It is given by a health care professional in a hospital or clinic setting. Talk to your pediatrician regarding the use of this medicine in children. Special care may be needed. Overdosage: If you think you have taken too much of this medicine contact a poison control center or emergency room at once. NOTE: This medicine is only for you. Do not share this medicine with others. What if I miss a dose? It is important not to miss your dose. Call your doctor or health care professional if you are unable to keep an appointment. What may interact with this medicine? Do not take this medicine with any of the following medications: -clozapine This medicine may also interact with the following medications: -atazanavir -cimetidine -ciprofloxacin -enoxacin -fluvoxamine -medicines for seizures like carbamazepine and phenobarbital -mexiletine -rifampin -tacrine -thiabendazole -zileuton This list may not describe all possible interactions. Give your health care provider a list of all the medicines, herbs, non-prescription drugs, or dietary supplements you use. Also tell them if you smoke, drink alcohol, or use illegal drugs. Some items may interact with your medicine. What should I watch for while  using this medicine? Your condition will be monitored carefully while you are receiving this medicine. This drug may make you feel generally unwell. This is not uncommon, as chemotherapy can affect healthy cells as well as cancer cells. Report any side effects. Continue your course of treatment even though you feel ill unless your doctor tells you to stop. Call your doctor or health care professional for advice if you get a fever, chills or sore throat, or other symptoms of a cold or flu. Do not treat yourself. This drug decreases your body's ability to fight infections. Try to avoid being around people who are sick. This medicine may increase your risk to bruise or bleed. Call your doctor or health care professional if you notice any unusual bleeding. Be careful brushing and flossing your teeth or using a toothpick because you may get an infection or bleed more easily. If you have any dental work done, tell your dentist you are receiving this medicine. Avoid taking products that contain aspirin, acetaminophen, ibuprofen, naproxen, or ketoprofen unless instructed by your doctor. These medicines may hide a fever. Do not become pregnant while taking this medicine. Women should inform their doctor if they wish to become pregnant or think they might be pregnant. There is a potential for serious side effects to an unborn child. Men should inform their doctors if they wish to father a child. This medicine may lower sperm counts. Talk to your health care professional or pharmacist for more information. Do not breast-feed an infant while taking this medicine. What side effects may I notice from receiving this medicine? Side effects that you should report to your doctor or health care professional as soon as possible: -allergic reactions like skin rash,   itching or hives, swelling of the face, lips, or tongue -low blood counts - this medicine may decrease the number of white blood cells, red blood cells and  platelets. You may be at increased risk for infections and bleeding. -signs of infection - fever or chills, cough, sore throat, pain or difficulty passing urine -signs of decreased platelets or bleeding - bruising, pinpoint red spots on the skin, black, tarry stools, blood in the urine -signs of decreased red blood cells - unusually weak or tired, fainting spells, lightheadedness -trouble passing urine or change in the amount of urine Side effects that usually do not require medical attention (report to your doctor or health care professional if they continue or are bothersome): -diarrhea This list may not describe all possible side effects. Call your doctor for medical advice about side effects. You may report side effects to FDA at 1-800-FDA-1088. Where should I keep my medicine? This drug is given in a hospital or clinic and will not be stored at home. NOTE: This sheet is a summary. It may not cover all possible information. If you have questions about this medicine, talk to your doctor, pharmacist, or health care provider.  2015, Elsevier/Gold Standard. (2011-04-28 14:15:47)  

## 2014-01-16 ENCOUNTER — Ambulatory Visit: Payer: PRIVATE HEALTH INSURANCE

## 2014-01-28 ENCOUNTER — Telehealth: Payer: Self-pay | Admitting: *Deleted

## 2014-01-28 DIAGNOSIS — C8583 Other specified types of non-Hodgkin lymphoma, intra-abdominal lymph nodes: Secondary | ICD-10-CM

## 2014-01-28 MED ORDER — CLINDAMYCIN PHOSPHATE 1 % EX LOTN: TOPICAL_LOTION | Freq: Two times a day (BID) | CUTANEOUS | Status: DC

## 2014-01-28 NOTE — Telephone Encounter (Signed)
Patient's daughter called stating that patient had bright red rash that started on nose, but is now spreading to the entire face. It's raised but not blistered, painful or itchy. Of note, the daughter is relaying this info from what the patient called and told her. She has not seen it. She just received Rituxan and Treanda last week for the first time. Dr Marin Olp notified and he would like patient to come in for an assessment. Told daughter, but she is unsure this would be possible. Patient states she cannot come in today, but she is able tomorrow. Appointment made with [name] NP for 12/17.

## 2014-01-28 NOTE — Telephone Encounter (Signed)
Received picture from patient's cell phone to more clearly show rash. Showed Dr Marin Olp and spoke with Pharmacy. There is a possibility this is a result of the Treanda. Dr Marin Olp gave order to call in cleocin ointment for patient to apply to rash BID. Called patient and let her know. Appointment for tomorrow cancelled. Medication called into pharmacy and patient aware. Told her to call back if rash worsened or spread to more parts of the body.

## 2014-01-29 ENCOUNTER — Ambulatory Visit: Payer: PRIVATE HEALTH INSURANCE | Admitting: Family

## 2014-02-11 ENCOUNTER — Ambulatory Visit: Payer: PRIVATE HEALTH INSURANCE | Admitting: Hematology & Oncology

## 2014-02-11 ENCOUNTER — Ambulatory Visit: Payer: PRIVATE HEALTH INSURANCE

## 2014-02-11 ENCOUNTER — Other Ambulatory Visit: Payer: PRIVATE HEALTH INSURANCE | Admitting: Lab

## 2014-02-12 ENCOUNTER — Encounter: Payer: Self-pay | Admitting: Hematology & Oncology

## 2014-02-12 ENCOUNTER — Ambulatory Visit: Payer: PRIVATE HEALTH INSURANCE

## 2014-02-13 HISTORY — PX: ELBOW BURSA SURGERY: SHX615

## 2014-02-18 ENCOUNTER — Ambulatory Visit (HOSPITAL_BASED_OUTPATIENT_CLINIC_OR_DEPARTMENT_OTHER): Payer: 59 | Admitting: Hematology & Oncology

## 2014-02-18 ENCOUNTER — Telehealth: Payer: Self-pay | Admitting: Hematology & Oncology

## 2014-02-18 ENCOUNTER — Ambulatory Visit (HOSPITAL_BASED_OUTPATIENT_CLINIC_OR_DEPARTMENT_OTHER): Payer: 59

## 2014-02-18 ENCOUNTER — Other Ambulatory Visit (HOSPITAL_BASED_OUTPATIENT_CLINIC_OR_DEPARTMENT_OTHER): Payer: 59 | Admitting: Lab

## 2014-02-18 ENCOUNTER — Encounter: Payer: Self-pay | Admitting: Hematology & Oncology

## 2014-02-18 VITALS — BP 132/63 | HR 80 | Temp 97.4°F | Resp 14 | Ht 60.0 in | Wt 110.0 lb

## 2014-02-18 DIAGNOSIS — C8583 Other specified types of non-Hodgkin lymphoma, intra-abdominal lymph nodes: Secondary | ICD-10-CM

## 2014-02-18 DIAGNOSIS — Z5111 Encounter for antineoplastic chemotherapy: Secondary | ICD-10-CM

## 2014-02-18 DIAGNOSIS — Z5112 Encounter for antineoplastic immunotherapy: Secondary | ICD-10-CM

## 2014-02-18 LAB — CMP (CANCER CENTER ONLY)
ALBUMIN: 3.2 g/dL — AB (ref 3.3–5.5)
ALK PHOS: 89 U/L — AB (ref 26–84)
ALT(SGPT): 6 U/L — ABNORMAL LOW (ref 10–47)
AST: 32 U/L (ref 11–38)
BUN, Bld: 18 mg/dL (ref 7–22)
CALCIUM: 9.5 mg/dL (ref 8.0–10.3)
CHLORIDE: 104 meq/L (ref 98–108)
CO2: 29 mEq/L (ref 18–33)
Creat: 0.8 mg/dl (ref 0.6–1.2)
GLUCOSE: 98 mg/dL (ref 73–118)
Potassium: 4.1 mEq/L (ref 3.3–4.7)
Sodium: 144 mEq/L (ref 128–145)
TOTAL PROTEIN: 6.4 g/dL (ref 6.4–8.1)
Total Bilirubin: 0.6 mg/dl (ref 0.20–1.60)

## 2014-02-18 LAB — CBC WITH DIFFERENTIAL (CANCER CENTER ONLY)
BASO#: 0.1 10*3/uL (ref 0.0–0.2)
BASO%: 1.9 % (ref 0.0–2.0)
EOS%: 7.5 % — ABNORMAL HIGH (ref 0.0–7.0)
Eosinophils Absolute: 0.2 10*3/uL (ref 0.0–0.5)
HCT: 39.8 % (ref 34.8–46.6)
HGB: 12.8 g/dL (ref 11.6–15.9)
LYMPH#: 0.5 10*3/uL — ABNORMAL LOW (ref 0.9–3.3)
LYMPH%: 15 % (ref 14.0–48.0)
MCH: 29.7 pg (ref 26.0–34.0)
MCHC: 32.2 g/dL (ref 32.0–36.0)
MCV: 92 fL (ref 81–101)
MONO#: 0.3 10*3/uL (ref 0.1–0.9)
MONO%: 10 % (ref 0.0–13.0)
NEUT#: 2.1 10*3/uL (ref 1.5–6.5)
NEUT%: 65.6 % (ref 39.6–80.0)
PLATELETS: 245 10*3/uL (ref 145–400)
RBC: 4.31 10*6/uL (ref 3.70–5.32)
RDW: 14.9 % (ref 11.1–15.7)
WBC: 3.2 10*3/uL — AB (ref 3.9–10.0)

## 2014-02-18 MED ORDER — ONDANSETRON 8 MG/50ML IVPB (CHCC)
8.0000 mg | Freq: Once | INTRAVENOUS | Status: AC
  Administered 2014-02-18: 8 mg via INTRAVENOUS

## 2014-02-18 MED ORDER — SODIUM CHLORIDE 0.9 % IV SOLN
Freq: Once | INTRAVENOUS | Status: AC
  Administered 2014-02-18: 09:00:00 via INTRAVENOUS

## 2014-02-18 MED ORDER — ACETAMINOPHEN 325 MG PO TABS
650.0000 mg | ORAL_TABLET | Freq: Once | ORAL | Status: AC
  Administered 2014-02-18: 650 mg via ORAL

## 2014-02-18 MED ORDER — RITUXIMAB CHEMO INJECTION 500 MG/50ML
375.0000 mg/m2 | Freq: Once | INTRAVENOUS | Status: AC
  Administered 2014-02-18: 500 mg via INTRAVENOUS
  Filled 2014-02-18: qty 50

## 2014-02-18 MED ORDER — SODIUM CHLORIDE 0.9 % IV SOLN
70.0000 mg/m2 | Freq: Once | INTRAVENOUS | Status: AC
  Administered 2014-02-18: 99 mg via INTRAVENOUS
  Filled 2014-02-18: qty 1.1

## 2014-02-18 MED ORDER — DEXAMETHASONE SODIUM PHOSPHATE 10 MG/ML IJ SOLN
10.0000 mg | Freq: Once | INTRAMUSCULAR | Status: AC
  Administered 2014-02-18: 10 mg via INTRAVENOUS

## 2014-02-18 MED ORDER — DEXAMETHASONE SODIUM PHOSPHATE 10 MG/ML IJ SOLN
INTRAMUSCULAR | Status: AC
  Filled 2014-02-18: qty 1

## 2014-02-18 MED ORDER — ACETAMINOPHEN 325 MG PO TABS
ORAL_TABLET | ORAL | Status: AC
  Filled 2014-02-18: qty 2

## 2014-02-18 MED ORDER — DIPHENHYDRAMINE HCL 25 MG PO CAPS
ORAL_CAPSULE | ORAL | Status: AC
  Filled 2014-02-18: qty 2

## 2014-02-18 MED ORDER — DIPHENHYDRAMINE HCL 25 MG PO CAPS
50.0000 mg | ORAL_CAPSULE | Freq: Once | ORAL | Status: AC
  Administered 2014-02-18: 50 mg via ORAL

## 2014-02-18 NOTE — Patient Instructions (Signed)
[name] Discharge Instructions for Patients Receiving Chemotherapy  Today you received the following chemotherapy agents Rituxan and Treanda. To help prevent nausea and vomiting after your treatment, we encourage you to take your nausea medication as prescribed.   If you develop nausea and vomiting that is not controlled by your nausea medication, call the clinic.   BELOW ARE SYMPTOMS THAT SHOULD BE REPORTED IMMEDIATELY:  *FEVER GREATER THAN 100.5 F  *CHILLS WITH OR WITHOUT FEVER  NAUSEA AND VOMITING THAT IS NOT CONTROLLED WITH YOUR NAUSEA MEDICATION  *UNUSUAL SHORTNESS OF BREATH  *UNUSUAL BRUISING OR BLEEDING  TENDERNESS IN MOUTH AND THROAT WITH OR WITHOUT PRESENCE OF ULCERS  *URINARY PROBLEMS  *BOWEL PROBLEMS  UNUSUAL RASH Items with * indicate a potential emergency and should be followed up as soon as possible.  Feel free to call the clinic you have any questions or concerns. The clinic phone number is [phone].    

## 2014-02-18 NOTE — Progress Notes (Signed)
Hematology and Oncology Follow Up Visit  Kadija Cruzen [id number] [name] 06, 1928 81 y.o. 02/18/2014   Principle Diagnosis:   Marginal zone lymphoma  Current Therapy:    Status post cycle 2 of Rituxan/Treanda     Interim History:  Ms.  Passe is back for follow-up. She had he tolerated her chemotherapy quite well. She had a good Christmas. She ate well. She had no problems with nausea or vomiting.  We did go ahead and repeat a PET scan on her. She has had a very nice response. She's had marked decrease in lymphadenopathy. Her mesenteric nodes now measure only 3.9 x 1 cm. Previously it measured 2.6 x 7.7 cm. Again there is very little uptake noted. A number area of adenopathy which measured 1.5 x 2.4 cm now only measures 6 x 7 mm.  No new disease is noted in the scans..  She's had very little, if any, of nausea vomiting. She does have atrial fibrillation. She is on ELIQUIS for this. There has been no bleeding.   She's had no diarrhea. She's had no leg swelling. She has numerous skin lesions which look like squamous cell or basal cell carcinomas. I told her that she can have these taken off at any time.  There's been no fever. She's had no cough. There is no increased shortness of breath.  Medications: Current outpatient prescriptions: apixaban (ELIQUIS) 2.5 MG TABS tablet, Take 2.5 mg by mouth 2 (two) times daily. , Disp: , Rfl: ;  clindamycin (CLEOCIN-T) 1 % lotion, Apply topically 2 (two) times daily., Disp: 60 mL, Rfl: 3;  dexamethasone (DECADRON) 4 MG tablet, Take 2 tablets (8 mg total) by mouth 2 (two) times daily with a meal. Start the day after chemotherapy for 2 days. Take with food., Disp: 30 tablet, Rfl: 1 famciclovir (FAMVIR) 500 MG tablet, Take 1 tablet (500 mg total) by mouth daily., Disp: 30 tablet, Rfl: 6;  metoprolol succinate (TOPROL-XL) 25 MG 24 hr tablet, Take 25 mg by mouth 2 (two) times daily. , Disp: , Rfl: ;  mirtazapine (REMERON) 30 MG tablet, Take 0.5 tablets (15 mg  total) by mouth at bedtime as needed., Disp: 5 tablet, Rfl: 0;  Multiple Vitamin (MULTI-VITAMINS) TABS, Take by mouth every morning. , Disp: , Rfl:  ondansetron (ZOFRAN) 8 MG tablet, Take 1 tablet (8 mg total) by mouth 2 (two) times daily. Start the day after chemo for 2 days. Then take as needed for nausea or vomiting., Disp: 30 tablet, Rfl: 1;  omeprazole (PRILOSEC) 40 MG capsule, Take 40 mg by mouth daily. PRN, Disp: , Rfl:  prochlorperazine (COMPAZINE) 10 MG tablet, Take 1 tablet (10 mg total) by mouth every 6 (six) hours as needed (Nausea or vomiting). (Patient not taking: Reported on 02/18/2014), Disp: 30 tablet, Rfl: 1  Allergies: No Known Allergies  Past Medical History, Surgical history, Social history, and Family History were reviewed and updated.  Review of Systems: As above  Physical Exam:  height is 5' (1.524 m) and weight is 110 lb (49.896 kg). Her oral temperature is 97.4 F (36.3 C). Her blood pressure is 132/63 and her pulse is 80. Her respiration is 14.   Elderly, petite white female in no obvious distress. Head and neck exam shows no ocular or oral lesions. There are no palpable cervical or supraclavicular lymph nodes. Lungs are clear. Cardiac exam tachycardic and irregular, consistent with atrial fibrillation. There are no murmurs, rubs or bruits. Abdomen is soft. She has good bowel sounds. There is no  fluid wave. There is no palpable abdominal mass. There is no palpable liver or spleen tip. Back exam shows no tenderness over the spine, ribs or hips. She has some slight kyphosis. Extremities shows no clubbing, cyanosis or edema. She has age related osteo-arthritic changes in her joints. She has decent strength in her extremities. Skin exam shows scattered areas that appear to be consistent with squamous cell or basal cell carcinomas. Neurological exam is nonfocal.  Lab Results  Component Value Date   WBC 3.2* 02/18/2014   HGB 12.8 02/18/2014   HCT 39.8 02/18/2014   MCV 92  02/18/2014   PLT 245 02/18/2014     Chemistry      Component Value Date/Time   NA 144 02/18/2014 0813   K 4.1 02/18/2014 0813   CL 104 02/18/2014 0813   CO2 29 02/18/2014 0813   BUN 18 02/18/2014 0813   CREATININE 0.8 02/18/2014 0813      Component Value Date/Time   CALCIUM 9.5 02/18/2014 0813   ALKPHOS 89* 02/18/2014 0813   AST 32 02/18/2014 0813   ALT 6* 02/18/2014 0813   BILITOT 0.60 02/18/2014 0813         Impression and Plan: Ms. Glaus is 81 year old white female. She has marginal zone lymphoma. She has abdominal disease that is fairly extensive.  She has had a very nice response to treatment. As such, I think only 6 cycles will be necessary.  I reviewed the scans with she and her daughter..  We will plan to have her come back in 4 weeks.   I suspect that we might be able to utilize Rituxan in a maintenance setting after chemotherapy. [name], MD 1/6/20168:55 AM

## 2014-02-18 NOTE — Telephone Encounter (Signed)
This patient does not require prior authorization for outpatient injectable chemotherapy w [employer] commercial.   Dx: C85.83   [id number] B9038 [id number] B3383 J1100 J9310 [id number]

## 2014-02-19 ENCOUNTER — Ambulatory Visit: Payer: 59

## 2014-02-19 ENCOUNTER — Ambulatory Visit (HOSPITAL_BASED_OUTPATIENT_CLINIC_OR_DEPARTMENT_OTHER): Payer: 59

## 2014-02-19 DIAGNOSIS — Z5111 Encounter for antineoplastic chemotherapy: Secondary | ICD-10-CM

## 2014-02-19 DIAGNOSIS — C8583 Other specified types of non-Hodgkin lymphoma, intra-abdominal lymph nodes: Secondary | ICD-10-CM

## 2014-02-19 MED ORDER — SODIUM CHLORIDE 0.9 % IV SOLN
Freq: Once | INTRAVENOUS | Status: AC
  Administered 2014-02-19: 12:00:00 via INTRAVENOUS

## 2014-02-19 MED ORDER — SODIUM CHLORIDE 0.9 % IV SOLN
70.0000 mg/m2 | Freq: Once | INTRAVENOUS | Status: AC
  Administered 2014-02-19: 99 mg via INTRAVENOUS
  Filled 2014-02-19: qty 1.1

## 2014-02-19 MED ORDER — DEXAMETHASONE SODIUM PHOSPHATE 10 MG/ML IJ SOLN
10.0000 mg | Freq: Once | INTRAMUSCULAR | Status: AC
  Administered 2014-02-19: 10 mg via INTRAVENOUS

## 2014-02-19 MED ORDER — ONDANSETRON 8 MG/50ML IVPB (CHCC)
8.0000 mg | Freq: Once | INTRAVENOUS | Status: AC
  Administered 2014-02-19: 8 mg via INTRAVENOUS

## 2014-02-19 MED ORDER — DEXAMETHASONE SODIUM PHOSPHATE 10 MG/ML IJ SOLN
INTRAMUSCULAR | Status: AC
  Filled 2014-02-19: qty 1

## 2014-02-19 NOTE — Patient Instructions (Signed)
[facility] Discharge Instructions for Patients Receiving Chemotherapy  Today you received the following chemotherapy agents Treanda.  To help prevent nausea and vomiting after your treatment, we encourage you to take your nausea medication.   If you develop nausea and vomiting that is not controlled by your nausea medication, call the clinic.   BELOW ARE SYMPTOMS THAT SHOULD BE REPORTED IMMEDIATELY:  *FEVER GREATER THAN 100.5 F  *CHILLS WITH OR WITHOUT FEVER  NAUSEA AND VOMITING THAT IS NOT CONTROLLED WITH YOUR NAUSEA MEDICATION  *UNUSUAL SHORTNESS OF BREATH  *UNUSUAL BRUISING OR BLEEDING  TENDERNESS IN MOUTH AND THROAT WITH OR WITHOUT PRESENCE OF ULCERS  *URINARY PROBLEMS  *BOWEL PROBLEMS  UNUSUAL RASH Items with * indicate a potential emergency and should be followed up as soon as possible.  Feel free to call the clinic you have any questions or concerns. The clinic phone number is [phone].    

## 2014-02-20 LAB — BETA 2 MICROGLOBULIN, SERUM: Beta-2 Microglobulin: 3.21 mg/L — ABNORMAL HIGH (ref <=2.51)

## 2014-02-20 LAB — LACTATE DEHYDROGENASE: LDH: 162 U/L (ref 94–250)

## 2014-03-18 ENCOUNTER — Ambulatory Visit (HOSPITAL_BASED_OUTPATIENT_CLINIC_OR_DEPARTMENT_OTHER): Payer: 59

## 2014-03-18 ENCOUNTER — Ambulatory Visit (HOSPITAL_BASED_OUTPATIENT_CLINIC_OR_DEPARTMENT_OTHER): Payer: 59 | Admitting: Family

## 2014-03-18 ENCOUNTER — Other Ambulatory Visit: Payer: Self-pay | Admitting: Nurse Practitioner

## 2014-03-18 ENCOUNTER — Other Ambulatory Visit (HOSPITAL_BASED_OUTPATIENT_CLINIC_OR_DEPARTMENT_OTHER): Payer: 59 | Admitting: Lab

## 2014-03-18 VITALS — BP 119/68 | HR 52 | Temp 97.4°F | Resp 18 | Ht 60.0 in | Wt 113.6 lb

## 2014-03-18 DIAGNOSIS — C8583 Other specified types of non-Hodgkin lymphoma, intra-abdominal lymph nodes: Secondary | ICD-10-CM

## 2014-03-18 DIAGNOSIS — Z5112 Encounter for antineoplastic immunotherapy: Secondary | ICD-10-CM

## 2014-03-18 DIAGNOSIS — Z5111 Encounter for antineoplastic chemotherapy: Secondary | ICD-10-CM

## 2014-03-18 LAB — CBC WITH DIFFERENTIAL (CANCER CENTER ONLY)
BASO#: 0 10*3/uL (ref 0.0–0.2)
BASO%: 1.1 % (ref 0.0–2.0)
EOS%: 8.6 % — ABNORMAL HIGH (ref 0.0–7.0)
Eosinophils Absolute: 0.2 10*3/uL (ref 0.0–0.5)
HEMATOCRIT: 40.4 % (ref 34.8–46.6)
HGB: 13 g/dL (ref 11.6–15.9)
LYMPH#: 0.3 10*3/uL — ABNORMAL LOW (ref 0.9–3.3)
LYMPH%: 11.9 % — AB (ref 14.0–48.0)
MCH: 30.1 pg (ref 26.0–34.0)
MCHC: 32.2 g/dL (ref 32.0–36.0)
MCV: 94 fL (ref 81–101)
MONO#: 0.3 10*3/uL (ref 0.1–0.9)
MONO%: 10.4 % (ref 0.0–13.0)
NEUT%: 68 % (ref 39.6–80.0)
NEUTROS ABS: 1.8 10*3/uL (ref 1.5–6.5)
Platelets: 223 10*3/uL (ref 145–400)
RBC: 4.32 10*6/uL (ref 3.70–5.32)
RDW: 14.9 % (ref 11.1–15.7)
WBC: 2.7 10*3/uL — ABNORMAL LOW (ref 3.9–10.0)

## 2014-03-18 LAB — CMP (CANCER CENTER ONLY)
ALBUMIN: 3.2 g/dL — AB (ref 3.3–5.5)
ALT: 11 U/L (ref 10–47)
AST: 25 U/L (ref 11–38)
Alkaline Phosphatase: 72 U/L (ref 26–84)
BILIRUBIN TOTAL: 0.7 mg/dL (ref 0.20–1.60)
BUN, Bld: 16 mg/dL (ref 7–22)
CHLORIDE: 105 meq/L (ref 98–108)
CO2: 27 meq/L (ref 18–33)
Calcium: 9.3 mg/dL (ref 8.0–10.3)
Creat: 0.7 mg/dl (ref 0.6–1.2)
Glucose, Bld: 82 mg/dL (ref 73–118)
Potassium: 4 mEq/L (ref 3.3–4.7)
SODIUM: 142 meq/L (ref 128–145)
Total Protein: 6.1 g/dL — ABNORMAL LOW (ref 6.4–8.1)

## 2014-03-18 LAB — LACTATE DEHYDROGENASE: LDH: 157 U/L (ref 94–250)

## 2014-03-18 MED ORDER — DEXAMETHASONE SODIUM PHOSPHATE 10 MG/ML IJ SOLN
INTRAMUSCULAR | Status: AC
  Filled 2014-03-18: qty 1

## 2014-03-18 MED ORDER — SODIUM CHLORIDE 0.9 % IV SOLN
Freq: Once | INTRAVENOUS | Status: AC
  Administered 2014-03-18: 10:00:00 via INTRAVENOUS

## 2014-03-18 MED ORDER — ONDANSETRON 8 MG/50ML IVPB (CHCC)
8.0000 mg | Freq: Once | INTRAVENOUS | Status: AC
Start: 2014-03-18 — End: 2014-03-18
  Administered 2014-03-18: 8 mg via INTRAVENOUS

## 2014-03-18 MED ORDER — DIPHENHYDRAMINE HCL 25 MG PO CAPS
ORAL_CAPSULE | ORAL | Status: AC
  Filled 2014-03-18: qty 2

## 2014-03-18 MED ORDER — DEXAMETHASONE 4 MG PO TABS: 8.0000 mg | ORAL_TABLET | Freq: Two times a day (BID) | ORAL | Status: DC

## 2014-03-18 MED ORDER — SODIUM CHLORIDE 0.9 % IV SOLN
375.0000 mg/m2 | Freq: Once | INTRAVENOUS | Status: AC
  Administered 2014-03-18: 500 mg via INTRAVENOUS
  Filled 2014-03-18: qty 50

## 2014-03-18 MED ORDER — DEXAMETHASONE SODIUM PHOSPHATE 10 MG/ML IJ SOLN
10.0000 mg | Freq: Once | INTRAMUSCULAR | Status: AC
  Administered 2014-03-18: 10 mg via INTRAVENOUS

## 2014-03-18 MED ORDER — FAMCICLOVIR 500 MG PO TABS: 500.0000 mg | ORAL_TABLET | Freq: Every day | ORAL | Status: DC

## 2014-03-18 MED ORDER — ONDANSETRON HCL 8 MG PO TABS: 8.0000 mg | ORAL_TABLET | Freq: Two times a day (BID) | ORAL | Status: DC

## 2014-03-18 MED ORDER — ACETAMINOPHEN 325 MG PO TABS
650.0000 mg | ORAL_TABLET | Freq: Once | ORAL | Status: AC
  Administered 2014-03-18: 650 mg via ORAL

## 2014-03-18 MED ORDER — SODIUM CHLORIDE 0.9 % IV SOLN
70.0000 mg/m2 | Freq: Once | INTRAVENOUS | Status: AC
  Administered 2014-03-18: 99 mg via INTRAVENOUS
  Filled 2014-03-18: qty 1.1

## 2014-03-18 MED ORDER — DIPHENHYDRAMINE HCL 25 MG PO CAPS
50.0000 mg | ORAL_CAPSULE | Freq: Once | ORAL | Status: AC
Start: 2014-03-18 — End: 2014-03-18
  Administered 2014-03-18: 50 mg via ORAL

## 2014-03-18 MED ORDER — ACETAMINOPHEN 325 MG PO TABS
ORAL_TABLET | ORAL | Status: AC
  Filled 2014-03-18: qty 2

## 2014-03-18 NOTE — Patient Instructions (Signed)
[facility] Discharge Instructions for Patients Receiving Chemotherapy  Today you received the following chemotherapy agents Rituxan and Treanda. To help prevent nausea and vomiting after your treatment, we encourage you to take your nausea medication as prescribed.   If you develop nausea and vomiting that is not controlled by your nausea medication, call the clinic.   BELOW ARE SYMPTOMS THAT SHOULD BE REPORTED IMMEDIATELY:  *FEVER GREATER THAN 100.5 F  *CHILLS WITH OR WITHOUT FEVER  NAUSEA AND VOMITING THAT IS NOT CONTROLLED WITH YOUR NAUSEA MEDICATION  *UNUSUAL SHORTNESS OF BREATH  *UNUSUAL BRUISING OR BLEEDING  TENDERNESS IN MOUTH AND THROAT WITH OR WITHOUT PRESENCE OF ULCERS  *URINARY PROBLEMS  *BOWEL PROBLEMS  UNUSUAL RASH Items with * indicate a potential emergency and should be followed up as soon as possible.  Feel free to call the clinic you have any questions or concerns. The clinic phone number is [phone].    

## 2014-03-18 NOTE — Progress Notes (Signed)
[place]  [phone] [phone]  ID: Kathy Massey OB: 19-Aug-1927 MR#: [id number]#:[id number] Patient Care Team: Raelene Bott, MD as PCP - General (Internal Medicine)  DIAGNOSIS: Marginal zone lymphoma  INTERVAL HISTORY: KathyMassey is here today for a follow-up. She is doing well and has tolerated her treatments with minimal side effects. She did have one episode of nausea and vomiting after her last treatment. She has also noticed that she has urinary frequency/urgency for several day after treatment. She is also tired a good bit of the time.  The lesions she had come out on her face have cleared up.  Her PET scan in December showed a nice response to treatment. No new disease. She denies fever, chills, n/v, cough, rash, headaches, dizziness, SOB, chest pain, abdominal pain, constipation, diarrhea, blood in urine or stool.  No swelling, tenderness, numbness or tingling in her extremities.  Her appetite is good and she is staying hydrated. Her weight is stable at 113 lbs.  She had a bout with her atrial fib last month and had some adjustments made to her medications. Her cardiologist is following her closely for this.   CURRENT TREATMENT: Status post cycle 2 of Rituxan/Treanda  REVIEW OF SYSTEMS: All other 10 point review of systems is negative.   PAST MEDICAL HISTORY: Past Medical History  Diagnosis Date  . 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  FAMILY HISTORY No family history on file.  GYNECOLOGIC HISTORY:  No LMP recorded. Patient is postmenopausal.   SOCIAL HISTORY: History   Social History  . Marital Status: Widowed    Spouse Name: N/A    Number of Children: N/A  . Years of Education: N/A   Occupational History  . Not on file.   Social History Main Topics  . Smoking status: Never Smoker   . Smokeless tobacco: Never Used     Comment:  never used tobacco  . Alcohol Use: Not on file  . Drug Use: Not on file  . Sexual Activity: Not on file   Other Topics Concern  . Not on file   Social History Narrative    ADVANCED DIRECTIVES:  <no information>  HEALTH MAINTENANCE: History  Substance Use Topics  . Smoking status: Never Smoker   . Smokeless tobacco: Never Used     Comment: never used tobacco  . Alcohol Use: Not on file   Colonoscopy: PAP: Bone density: Lipid panel:  No Known Allergies  Current Outpatient Prescriptions  Medication Sig Dispense Refill  . apixaban (ELIQUIS) 2.5 MG TABS tablet Take 2.5 mg by mouth 2 (two) times daily.     . clindamycin (CLEOCIN-T) 1 % lotion Apply topically 2 (two) times daily. 60 mL 3  . dexamethasone (DECADRON) 4 MG tablet Take 2 tablets (8 mg total) by mouth 2 (two) times daily with a meal. Start the day after chemotherapy for 2 days. Take with food. 30 tablet 3  . famciclovir (FAMVIR) 500 MG tablet Take 1 tablet (500 mg total) by mouth daily. 30 tablet 6  . metoprolol succinate (TOPROL-XL) 25 MG 24 hr tablet Take 25 mg by mouth 2 (two) times daily.     . mirtazapine (REMERON) 30 MG tablet Take 0.5 tablets (15 mg total) by mouth at bedtime as needed. 5 tablet 0  . Multiple Vitamin (MULTI-VITAMINS) TABS Take by mouth every morning.     Marland Kitchen omeprazole (PRILOSEC) 40 MG capsule Take  40 mg by mouth daily. PRN    . ondansetron (ZOFRAN) 8 MG tablet Take 1 tablet (8 mg total) by mouth 2 (two) times daily. Start the day after chemo for 2 days. Then take as needed for nausea or vomiting. 30 tablet 3  . prochlorperazine (COMPAZINE) 10 MG tablet Take 1 tablet (10 mg total) by mouth every 6 (six) hours as needed (Nausea or vomiting). (Patient not taking: Reported on 02/18/2014) 30 tablet 1   No current facility-administered medications for this visit.    OBJECTIVE: Filed Vitals:   03/18/14 0921  BP: 119/68  Pulse: 52  Temp: 97.4 F (36.3 C)  Resp: 18    Filed Weights   03/18/14  0921  Weight: 113 lb 9.6 oz (51.529 kg)   ECOG FS:1 - Symptomatic but completely ambulatory Ocular: Sclerae unicteric, pupils equal, round and reactive to light Ear-nose-throat: Oropharynx clear, dentition fair Lymphatic: No cervical or supraclavicular adenopathy Lungs no rales or rhonchi, good excursion bilaterally Heart regular rate and rhythm, no murmur appreciated Abd soft, nontender, positive bowel sounds MSK no focal spinal tenderness, no joint edema Neuro: non-focal, well-oriented, appropriate affect Breasts: Deferred  LAB RESULTS: CMP     Component Value Date/Time   NA 144 02/18/2014 0813   K 4.1 02/18/2014 0813   CL 104 02/18/2014 0813   CO2 29 02/18/2014 0813   GLUCOSE 98 02/18/2014 0813   BUN 18 02/18/2014 0813   CREATININE 0.8 02/18/2014 0813   CALCIUM 9.5 02/18/2014 0813   PROT 6.4 02/18/2014 0813   AST 32 02/18/2014 0813   ALT 6* 02/18/2014 0813   ALKPHOS 89* 02/18/2014 0813   BILITOT 0.60 02/18/2014 0813   INo results found for: SPEP, UPEP Lab Results  Component Value Date   WBC 2.7* 03/18/2014   NEUTROABS 1.8 03/18/2014   HGB 13.0 03/18/2014   HCT 40.4 03/18/2014   MCV 94 03/18/2014   PLT 223 03/18/2014   No results found for: LABCA2 No components found for: OIBBC488 No results for input(s): INR in the last 168 hours. Urinalysis No results found for: COLORURINE, APPEARANCEUR, LABSPEC, PHURINE, GLUCOSEU, HGBUR, BILIRUBINUR, KETONESUR, PROTEINUR, UROBILINOGEN, NITRITE, LEUKOCYTESUR STUDIES: No results found.  ASSESSMENT/PLAN: Kathy Massey is 81 year old white female with marginal zone lymphoma. She has abdominal disease that is fairly extensive. She is feeling better today but is still tired.  Her PET scan in December showed a good response to treatment and no new disease.  She will get cycle 4 of Rituxan/Treanda today. We plan on completing 6 cycles of chemotherapy and then using Rituxan as maintenance.  We will give her a new treatment schedule today.   She knows to call here with any questions or concerns and to go to the ED in the event of an emergency. We can certainly see her sooner if needed.   Eliezer Bottom, NP 03/18/2014 9:52 AM

## 2014-03-19 ENCOUNTER — Ambulatory Visit (HOSPITAL_BASED_OUTPATIENT_CLINIC_OR_DEPARTMENT_OTHER): Payer: 59

## 2014-03-19 DIAGNOSIS — C8583 Other specified types of non-Hodgkin lymphoma, intra-abdominal lymph nodes: Secondary | ICD-10-CM

## 2014-03-19 DIAGNOSIS — Z5111 Encounter for antineoplastic chemotherapy: Secondary | ICD-10-CM

## 2014-03-19 MED ORDER — SODIUM CHLORIDE 0.9 % IV SOLN
Freq: Once | INTRAVENOUS | Status: AC
  Administered 2014-03-19: 11:00:00 via INTRAVENOUS

## 2014-03-19 MED ORDER — DEXAMETHASONE SODIUM PHOSPHATE 10 MG/ML IJ SOLN
INTRAMUSCULAR | Status: AC
  Filled 2014-03-19: qty 1

## 2014-03-19 MED ORDER — SODIUM CHLORIDE 0.9 % IV SOLN
70.0000 mg/m2 | Freq: Once | INTRAVENOUS | Status: AC
  Administered 2014-03-19: 99 mg via INTRAVENOUS
  Filled 2014-03-19: qty 1.1

## 2014-03-19 MED ORDER — DEXAMETHASONE SODIUM PHOSPHATE 10 MG/ML IJ SOLN
10.0000 mg | Freq: Once | INTRAMUSCULAR | Status: AC
  Administered 2014-03-19: 10 mg via INTRAVENOUS

## 2014-03-19 MED ORDER — ONDANSETRON 8 MG/50ML IVPB (CHCC)
8.0000 mg | Freq: Once | INTRAVENOUS | Status: AC
  Administered 2014-03-19: 8 mg via INTRAVENOUS

## 2014-03-19 NOTE — Patient Instructions (Signed)
Bendamustine Injection What is this medicine? BENDAMUSTINE (BEN da MUS teen) is a chemotherapy drug. It is used to treat chronic lymphocytic leukemia and non-Hodgkin lymphoma. This medicine may be used for other purposes; ask your health care provider or pharmacist if you have questions. COMMON BRAND NAME(S): Treanda What should I tell my health care provider before I take this medicine? They need to know if you have any of these conditions: -kidney disease -liver disease -an unusual or allergic reaction to bendamustine, mannitol, other medicines, foods, dyes, or preservatives -pregnant or trying to get pregnant -breast-feeding How should I use this medicine? This medicine is for infusion into a vein. It is given by a health care professional in a hospital or clinic setting. Talk to your pediatrician regarding the use of this medicine in children. Special care may be needed. Overdosage: If you think you have taken too much of this medicine contact a poison control center or emergency room at once. NOTE: This medicine is only for you. Do not share this medicine with others. What if I miss a dose? It is important not to miss your dose. Call your doctor or health care professional if you are unable to keep an appointment. What may interact with this medicine? Do not take this medicine with any of the following medications: -clozapine This medicine may also interact with the following medications: -atazanavir -cimetidine -ciprofloxacin -enoxacin -fluvoxamine -medicines for seizures like carbamazepine and phenobarbital -mexiletine -rifampin -tacrine -thiabendazole -zileuton This list may not describe all possible interactions. Give your health care provider a list of all the medicines, herbs, non-prescription drugs, or dietary supplements you use. Also tell them if you smoke, drink alcohol, or use illegal drugs. Some items may interact with your medicine. What should I watch for while  using this medicine? Your condition will be monitored carefully while you are receiving this medicine. This drug may make you feel generally unwell. This is not uncommon, as chemotherapy can affect healthy cells as well as cancer cells. Report any side effects. Continue your course of treatment even though you feel ill unless your doctor tells you to stop. Call your doctor or health care professional for advice if you get a fever, chills or sore throat, or other symptoms of a cold or flu. Do not treat yourself. This drug decreases your body's ability to fight infections. Try to avoid being around people who are sick. This medicine may increase your risk to bruise or bleed. Call your doctor or health care professional if you notice any unusual bleeding. Be careful brushing and flossing your teeth or using a toothpick because you may get an infection or bleed more easily. If you have any dental work done, tell your dentist you are receiving this medicine. Avoid taking products that contain aspirin, acetaminophen, ibuprofen, naproxen, or ketoprofen unless instructed by your doctor. These medicines may hide a fever. Do not become pregnant while taking this medicine. Women should inform their doctor if they wish to become pregnant or think they might be pregnant. There is a potential for serious side effects to an unborn child. Men should inform their doctors if they wish to father a child. This medicine may lower sperm counts. Talk to your health care professional or pharmacist for more information. Do not breast-feed an infant while taking this medicine. What side effects may I notice from receiving this medicine? Side effects that you should report to your doctor or health care professional as soon as possible: -allergic reactions like skin rash,   itching or hives, swelling of the face, lips, or tongue -low blood counts - this medicine may decrease the number of white blood cells, red blood cells and  platelets. You may be at increased risk for infections and bleeding. -signs of infection - fever or chills, cough, sore throat, pain or difficulty passing urine -signs of decreased platelets or bleeding - bruising, pinpoint red spots on the skin, black, tarry stools, blood in the urine -signs of decreased red blood cells - unusually weak or tired, fainting spells, lightheadedness -trouble passing urine or change in the amount of urine Side effects that usually do not require medical attention (report to your doctor or health care professional if they continue or are bothersome): -diarrhea This list may not describe all possible side effects. Call your doctor for medical advice about side effects. You may report side effects to FDA at 1-800-FDA-1088. Where should I keep my medicine? This drug is given in a hospital or clinic and will not be stored at home. NOTE: This sheet is a summary. It may not cover all possible information. If you have questions about this medicine, talk to your doctor, pharmacist, or health care provider.  2015, Elsevier/Gold Standard. (2011-04-28 14:15:47)  

## 2014-04-15 ENCOUNTER — Other Ambulatory Visit: Payer: Self-pay | Admitting: Hematology & Oncology

## 2014-04-15 ENCOUNTER — Encounter: Payer: Self-pay | Admitting: Family

## 2014-04-15 ENCOUNTER — Ambulatory Visit (HOSPITAL_BASED_OUTPATIENT_CLINIC_OR_DEPARTMENT_OTHER): Payer: 59

## 2014-04-15 ENCOUNTER — Ambulatory Visit (HOSPITAL_BASED_OUTPATIENT_CLINIC_OR_DEPARTMENT_OTHER): Payer: 59 | Admitting: Family

## 2014-04-15 ENCOUNTER — Other Ambulatory Visit (HOSPITAL_BASED_OUTPATIENT_CLINIC_OR_DEPARTMENT_OTHER): Payer: 59 | Admitting: Lab

## 2014-04-15 VITALS — Wt 114.0 lb

## 2014-04-15 DIAGNOSIS — Z5112 Encounter for antineoplastic immunotherapy: Secondary | ICD-10-CM

## 2014-04-15 DIAGNOSIS — C8583 Other specified types of non-Hodgkin lymphoma, intra-abdominal lymph nodes: Secondary | ICD-10-CM

## 2014-04-15 DIAGNOSIS — Z5111 Encounter for antineoplastic chemotherapy: Secondary | ICD-10-CM

## 2014-04-15 DIAGNOSIS — I4891 Unspecified atrial fibrillation: Secondary | ICD-10-CM

## 2014-04-15 LAB — CMP (CANCER CENTER ONLY)
ALT(SGPT): 10 U/L (ref 10–47)
AST: 30 U/L (ref 11–38)
Albumin: 3.3 g/dL (ref 3.3–5.5)
Alkaline Phosphatase: 85 U/L — ABNORMAL HIGH (ref 26–84)
BUN: 14 mg/dL (ref 7–22)
CALCIUM: 9.5 mg/dL (ref 8.0–10.3)
CO2: 29 mEq/L (ref 18–33)
Chloride: 103 mEq/L (ref 98–108)
Creat: 1.1 mg/dl (ref 0.6–1.2)
Glucose, Bld: 64 mg/dL — ABNORMAL LOW (ref 73–118)
Potassium: 4.7 mEq/L (ref 3.3–4.7)
Sodium: 146 mEq/L — ABNORMAL HIGH (ref 128–145)
Total Bilirubin: 0.9 mg/dl (ref 0.20–1.60)
Total Protein: 6.3 g/dL — ABNORMAL LOW (ref 6.4–8.1)

## 2014-04-15 LAB — CBC WITH DIFFERENTIAL (CANCER CENTER ONLY)
BASO#: 0 10*3/uL (ref 0.0–0.2)
BASO%: 1.4 % (ref 0.0–2.0)
EOS%: 5.8 % (ref 0.0–7.0)
Eosinophils Absolute: 0.2 10*3/uL (ref 0.0–0.5)
HCT: 41.3 % (ref 34.8–46.6)
HGB: 13.4 g/dL (ref 11.6–15.9)
LYMPH#: 0.4 10*3/uL — AB (ref 0.9–3.3)
LYMPH%: 13 % — ABNORMAL LOW (ref 14.0–48.0)
MCH: 30.5 pg (ref 26.0–34.0)
MCHC: 32.4 g/dL (ref 32.0–36.0)
MCV: 94 fL (ref 81–101)
MONO#: 0.4 10*3/uL (ref 0.1–0.9)
MONO%: 13.7 % — AB (ref 0.0–13.0)
NEUT%: 66.1 % (ref 39.6–80.0)
NEUTROS ABS: 1.8 10*3/uL (ref 1.5–6.5)
Platelets: 235 10*3/uL (ref 145–400)
RBC: 4.39 10*6/uL (ref 3.70–5.32)
RDW: 14.6 % (ref 11.1–15.7)
WBC: 2.8 10*3/uL — ABNORMAL LOW (ref 3.9–10.0)

## 2014-04-15 LAB — LACTATE DEHYDROGENASE: LDH: 150 U/L (ref 94–250)

## 2014-04-15 MED ORDER — SODIUM CHLORIDE 0.9 % IV SOLN
375.0000 mg/m2 | Freq: Once | INTRAVENOUS | Status: AC
  Administered 2014-04-15: 500 mg via INTRAVENOUS
  Filled 2014-04-15: qty 50

## 2014-04-15 MED ORDER — DIPHENHYDRAMINE HCL 25 MG PO CAPS
50.0000 mg | ORAL_CAPSULE | Freq: Once | ORAL | Status: AC
  Administered 2014-04-15: 50 mg via ORAL

## 2014-04-15 MED ORDER — ACETAMINOPHEN 325 MG PO TABS
ORAL_TABLET | ORAL | Status: AC
  Filled 2014-04-15: qty 2

## 2014-04-15 MED ORDER — ACETAMINOPHEN 325 MG PO TABS
650.0000 mg | ORAL_TABLET | Freq: Once | ORAL | Status: AC
  Administered 2014-04-15: 650 mg via ORAL

## 2014-04-15 MED ORDER — DEXAMETHASONE SODIUM PHOSPHATE 10 MG/ML IJ SOLN
10.0000 mg | Freq: Once | INTRAMUSCULAR | Status: AC
  Administered 2014-04-15: 10 mg via INTRAVENOUS

## 2014-04-15 MED ORDER — DIPHENHYDRAMINE HCL 25 MG PO CAPS
ORAL_CAPSULE | ORAL | Status: AC
  Filled 2014-04-15: qty 1

## 2014-04-15 MED ORDER — SODIUM CHLORIDE 0.9 % IV SOLN
Freq: Once | INTRAVENOUS | Status: AC
  Administered 2014-04-15: 10:00:00 via INTRAVENOUS

## 2014-04-15 MED ORDER — DEXAMETHASONE SODIUM PHOSPHATE 10 MG/ML IJ SOLN
INTRAMUSCULAR | Status: AC
  Filled 2014-04-15: qty 1

## 2014-04-15 MED ORDER — SODIUM CHLORIDE 0.9 % IV SOLN
70.0000 mg/m2 | Freq: Once | INTRAVENOUS | Status: AC
  Administered 2014-04-15: 99 mg via INTRAVENOUS
  Filled 2014-04-15: qty 1.1

## 2014-04-15 MED ORDER — ONDANSETRON 8 MG/50ML IVPB (CHCC)
8.0000 mg | Freq: Once | INTRAVENOUS | Status: AC
  Administered 2014-04-15: 8 mg via INTRAVENOUS

## 2014-04-15 MED ORDER — HEPARIN SOD (PORK) LOCK FLUSH 100 UNIT/ML IV SOLN
500.0000 [IU] | Freq: Once | INTRAVENOUS | Status: DC | PRN
  Filled 2014-04-15: qty 5

## 2014-04-15 MED ORDER — SODIUM CHLORIDE 0.9 % IJ SOLN
10.0000 mL | INTRAMUSCULAR | Status: DC | PRN
  Filled 2014-04-15: qty 10

## 2014-04-15 NOTE — Patient Instructions (Signed)
[facility] Discharge Instructions for Patients Receiving Chemotherapy  Today you received the following chemotherapy agents Rituxan and Treanda. To help prevent nausea and vomiting after your treatment, we encourage you to take your nausea medication as prescribed.   If you develop nausea and vomiting that is not controlled by your nausea medication, call the clinic.   BELOW ARE SYMPTOMS THAT SHOULD BE REPORTED IMMEDIATELY:  *FEVER GREATER THAN 100.5 F  *CHILLS WITH OR WITHOUT FEVER  NAUSEA AND VOMITING THAT IS NOT CONTROLLED WITH YOUR NAUSEA MEDICATION  *UNUSUAL SHORTNESS OF BREATH  *UNUSUAL BRUISING OR BLEEDING  TENDERNESS IN MOUTH AND THROAT WITH OR WITHOUT PRESENCE OF ULCERS  *URINARY PROBLEMS  *BOWEL PROBLEMS  UNUSUAL RASH Items with * indicate a potential emergency and should be followed up as soon as possible.  Feel free to call the clinic you have any questions or concerns. The clinic phone number is [phone].    

## 2014-04-15 NOTE — Progress Notes (Signed)
[place]  [phone] [phone]) [phone]  ID: Kathy Massey OB: [name] 19, 1928 MR#: [id number]#:[id number] Patient Care Team: Raelene Bott, MD as PCP - General (Internal Medicine)  DIAGNOSIS: Marginal zone lymphoma  INTERVAL HISTORY: Kathy Massey is here today for a follow-up. She is doing fairly well. She is still having some issues with her atrial fib. Her PCP has increased her Toprolol. Hopefully that will help. She did have one episode of nausea and vomiting on Sunday after her last treatment. She vomited once and it passed.  Her PET scan in December showed a nice response to treatment. No new disease. She denies fever, chills, n/v, cough, rash, headaches, dizziness, SOB, chest pain, abdominal pain, constipation, diarrhea, blood in urine or stool. No episodes of bleeding.  No swelling, tenderness, numbness or tingling in her extremities. No new aches or pains.  Her appetite is good and she is staying hydrated. Her weight is stable at 114 lbs.   CURRENT TREATMENT: Status post cycle 4 of Rituxan/Treanda  REVIEW OF SYSTEMS: All other 10 point review of systems is negative.   PAST MEDICAL HISTORY: Past Medical History  Diagnosis Date  . 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  FAMILY HISTORY No family history on file.  GYNECOLOGIC HISTORY:  No LMP recorded. Patient is postmenopausal.   SOCIAL HISTORY: History   Social History  . Marital Status: Widowed    Spouse Name: N/A  . Number of Children: N/A  . Years of Education: N/A   Occupational History  . Not on file.   Social History Main Topics  . Smoking status: Never Smoker   . Smokeless tobacco: Never Used     Comment: never used tobacco  . Alcohol Use: Not on file  . Drug Use: Not on file  . Sexual Activity: Not on file   Other Topics Concern  . Not on file   Social History Narrative    ADVANCED  DIRECTIVES:  <no information>  HEALTH MAINTENANCE: History  Substance Use Topics  . Smoking status: Never Smoker   . Smokeless tobacco: Never Used     Comment: never used tobacco  . Alcohol Use: Not on file   Colonoscopy: PAP: Bone density: Lipid panel:  No Known Allergies  Current Outpatient Prescriptions  Medication Sig Dispense Refill  . apixaban (ELIQUIS) 2.5 MG TABS tablet Take 2.5 mg by mouth 2 (two) times daily.     . clindamycin (CLEOCIN-T) 1 % lotion Apply topically 2 (two) times daily. 60 mL 3  . dexamethasone (DECADRON) 4 MG tablet Take 2 tablets (8 mg total) by mouth 2 (two) times daily with a meal. Start the day after chemotherapy for 2 days. Take with food. 30 tablet 3  . famciclovir (FAMVIR) 500 MG tablet Take 1 tablet (500 mg total) by mouth daily. 30 tablet 6  . metoprolol succinate (TOPROL-XL) 25 MG 24 hr tablet Take 25 mg by mouth 2 (two) times daily.     . mirtazapine (REMERON) 30 MG tablet Take 0.5 tablets (15 mg total) by mouth at bedtime as needed. 5 tablet 0  . Multiple Vitamin (MULTI-VITAMINS) TABS Take by mouth every morning.     Marland Kitchen omeprazole (PRILOSEC) 40 MG capsule Take 40 mg by mouth daily. PRN    . ondansetron (ZOFRAN) 8 MG tablet Take 1 tablet (8 mg total) by mouth 2 (two) times daily. Start the day after chemo for 2 days. Then  take as needed for nausea or vomiting. 30 tablet 3  . prochlorperazine (COMPAZINE) 10 MG tablet Take 1 tablet (10 mg total) by mouth every 6 (six) hours as needed (Nausea or vomiting). (Patient not taking: Reported on 02/18/2014) 30 tablet 1   No current facility-administered medications for this visit.    OBJECTIVE: There were no vitals filed for this visit.  There were no vitals filed for this visit. ECOG FS:1 - Symptomatic but completely ambulatory Ocular: Sclerae unicteric, pupils equal, round and reactive to light Ear-nose-throat: Oropharynx clear, dentition fair Lymphatic: No cervical or supraclavicular  adenopathy Lungs no rales or rhonchi, good excursion bilaterally Heart regular rate and rhythm, no murmur appreciated Abd soft, nontender, positive bowel sounds MSK no focal spinal tenderness, no joint edema Neuro: non-focal, well-oriented, appropriate affect Breasts: Deferred  LAB RESULTS: CMP     Component Value Date/Time   NA 142 03/18/2014 0853   K 4.0 03/18/2014 0853   CL 105 03/18/2014 0853   CO2 27 03/18/2014 0853   GLUCOSE 82 03/18/2014 0853   BUN 16 03/18/2014 0853   CREATININE 0.7 03/18/2014 0853   CALCIUM 9.3 03/18/2014 0853   PROT 6.1* 03/18/2014 0853   AST 25 03/18/2014 0853   ALT 11 03/18/2014 0853   ALKPHOS 72 03/18/2014 0853   BILITOT 0.70 03/18/2014 0853   INo results found for: SPEP, UPEP Lab Results  Component Value Date   WBC 2.7* 03/18/2014   NEUTROABS 1.8 03/18/2014   HGB 13.0 03/18/2014   HCT 40.4 03/18/2014   MCV 94 03/18/2014   PLT 223 03/18/2014   No results found for: LABCA2 No components found for: MWUXL244 No results for input(s): INR in the last 168 hours. Urinalysis No results found for: COLORURINE, APPEARANCEUR, LABSPEC, PHURINE, GLUCOSEU, HGBUR, BILIRUBINUR, KETONESUR, PROTEINUR, UROBILINOGEN, NITRITE, LEUKOCYTESUR STUDIES: No results found.  ASSESSMENT/PLAN: Kathy Massey is 81 year old white female with marginal zone lymphoma. She has abdominal disease that is fairly extensive. She is feeling ok today and has no complaints.  Her PET scan in December showed a good response to treatment and no new disease. We will repeat her scans after her 6th cycle.  She will get cycle 5 of Rituxan/Treanda today. We plan on completing 6 cycles of chemotherapy and then using Rituxan as maintenance.  She has her appointment and treatment schedule.  She knows to call here with any questions or concerns and to go to the ED in the event of an emergency. We can certainly see her sooner if needed.   Eliezer Bottom, NP 04/15/2014 8:57 AM

## 2014-04-16 ENCOUNTER — Ambulatory Visit (HOSPITAL_BASED_OUTPATIENT_CLINIC_OR_DEPARTMENT_OTHER): Payer: 59

## 2014-04-16 DIAGNOSIS — C8583 Other specified types of non-Hodgkin lymphoma, intra-abdominal lymph nodes: Secondary | ICD-10-CM

## 2014-04-16 DIAGNOSIS — Z5111 Encounter for antineoplastic chemotherapy: Secondary | ICD-10-CM

## 2014-04-16 MED ORDER — ONDANSETRON 8 MG/50ML IVPB (CHCC)
8.0000 mg | Freq: Once | INTRAVENOUS | Status: AC
  Administered 2014-04-16: 8 mg via INTRAVENOUS

## 2014-04-16 MED ORDER — DEXAMETHASONE SODIUM PHOSPHATE 10 MG/ML IJ SOLN
10.0000 mg | Freq: Once | INTRAMUSCULAR | Status: AC
Start: 2014-04-16 — End: 2014-04-16
  Administered 2014-04-16: 10 mg via INTRAVENOUS

## 2014-04-16 MED ORDER — DEXAMETHASONE SODIUM PHOSPHATE 10 MG/ML IJ SOLN
INTRAMUSCULAR | Status: AC
  Filled 2014-04-16: qty 1

## 2014-04-16 MED ORDER — SODIUM CHLORIDE 0.9 % IV SOLN
Freq: Once | INTRAVENOUS | Status: AC
  Administered 2014-04-16: 11:00:00 via INTRAVENOUS

## 2014-04-16 MED ORDER — BENDAMUSTINE HCL CHEMO INJECTION 180 MG/2ML
70.0000 mg/m2 | Freq: Once | INTRAVENOUS | Status: AC
  Administered 2014-04-16: 99 mg via INTRAVENOUS
  Filled 2014-04-16: qty 1.1

## 2014-04-16 NOTE — Patient Instructions (Signed)
[facility] Discharge Instructions for Patients Receiving Chemotherapy  Today you received the following chemotherapy agents: Treanda.  To help prevent nausea and vomiting after your treatment, we encourage you to take your nausea medication as prescribed.   If you develop nausea and vomiting that is not controlled by your nausea medication, call the clinic.   BELOW ARE SYMPTOMS THAT SHOULD BE REPORTED IMMEDIATELY:  *FEVER GREATER THAN 100.5 F  *CHILLS WITH OR WITHOUT FEVER  NAUSEA AND VOMITING THAT IS NOT CONTROLLED WITH YOUR NAUSEA MEDICATION  *UNUSUAL SHORTNESS OF BREATH  *UNUSUAL BRUISING OR BLEEDING  TENDERNESS IN MOUTH AND THROAT WITH OR WITHOUT PRESENCE OF ULCERS  *URINARY PROBLEMS  *BOWEL PROBLEMS  UNUSUAL RASH Items with * indicate a potential emergency and should be followed up as soon as possible.  Feel free to call the clinic you have any questions or concerns. The clinic phone number is [phone].    

## 2014-05-13 ENCOUNTER — Ambulatory Visit: Payer: 59

## 2014-05-13 ENCOUNTER — Other Ambulatory Visit: Payer: 59 | Admitting: Lab

## 2014-05-13 ENCOUNTER — Ambulatory Visit: Payer: 59 | Admitting: Family

## 2014-05-14 ENCOUNTER — Ambulatory Visit: Payer: 59

## 2014-05-18 ENCOUNTER — Other Ambulatory Visit: Payer: Self-pay | Admitting: Hematology & Oncology

## 2014-05-20 ENCOUNTER — Encounter: Payer: Self-pay | Admitting: Family

## 2014-05-20 ENCOUNTER — Other Ambulatory Visit (HOSPITAL_BASED_OUTPATIENT_CLINIC_OR_DEPARTMENT_OTHER): Payer: Medicare Other

## 2014-05-20 ENCOUNTER — Ambulatory Visit (HOSPITAL_BASED_OUTPATIENT_CLINIC_OR_DEPARTMENT_OTHER): Payer: Medicare Other | Admitting: Family

## 2014-05-20 ENCOUNTER — Ambulatory Visit (HOSPITAL_BASED_OUTPATIENT_CLINIC_OR_DEPARTMENT_OTHER): Payer: Medicare Other

## 2014-05-20 VITALS — BP 122/65 | HR 52 | Temp 97.3°F | Resp 14 | Ht 61.0 in | Wt 118.0 lb

## 2014-05-20 DIAGNOSIS — Z5112 Encounter for antineoplastic immunotherapy: Secondary | ICD-10-CM | POA: Diagnosis not present

## 2014-05-20 DIAGNOSIS — C8583 Other specified types of non-Hodgkin lymphoma, intra-abdominal lymph nodes: Secondary | ICD-10-CM

## 2014-05-20 DIAGNOSIS — Z5111 Encounter for antineoplastic chemotherapy: Secondary | ICD-10-CM

## 2014-05-20 LAB — CMP (CANCER CENTER ONLY)
ALT(SGPT): 12 U/L (ref 10–47)
AST: 31 U/L (ref 11–38)
Albumin: 3.3 g/dL (ref 3.3–5.5)
Alkaline Phosphatase: 69 U/L (ref 26–84)
BUN, Bld: 20 mg/dL (ref 7–22)
CO2: 26 meq/L (ref 18–33)
Calcium: 9.2 mg/dL (ref 8.0–10.3)
Chloride: 107 meq/L (ref 98–108)
Creat: 0.8 mg/dL (ref 0.6–1.2)
Glucose, Bld: 108 mg/dL (ref 73–118)
Potassium: 4.4 meq/L (ref 3.3–4.7)
Sodium: 147 meq/L — ABNORMAL HIGH (ref 128–145)
Total Bilirubin: 0.8 mg/dL (ref 0.20–1.60)
Total Protein: 6 g/dL — ABNORMAL LOW (ref 6.4–8.1)

## 2014-05-20 LAB — LACTATE DEHYDROGENASE: LDH: 179 U/L (ref 94–250)

## 2014-05-20 LAB — CBC WITH DIFFERENTIAL (CANCER CENTER ONLY)
BASO#: 0.1 10e3/uL (ref 0.0–0.2)
BASO%: 1.8 % (ref 0.0–2.0)
EOS%: 7.1 % — ABNORMAL HIGH (ref 0.0–7.0)
Eosinophils Absolute: 0.2 10e3/uL (ref 0.0–0.5)
HCT: 37.5 % (ref 34.8–46.6)
HGB: 12.2 g/dL (ref 11.6–15.9)
LYMPH#: 0.4 10e3/uL — ABNORMAL LOW (ref 0.9–3.3)
LYMPH%: 15.6 % (ref 14.0–48.0)
MCH: 31.2 pg (ref 26.0–34.0)
MCHC: 32.5 g/dL (ref 32.0–36.0)
MCV: 96 fL (ref 81–101)
MONO#: 0.3 10e3/uL (ref 0.1–0.9)
MONO%: 10.6 % (ref 0.0–13.0)
NEUT#: 1.8 10e3/uL (ref 1.5–6.5)
NEUT%: 64.9 % (ref 39.6–80.0)
Platelets: 156 10e3/uL (ref 145–400)
RBC: 3.91 10e6/uL (ref 3.70–5.32)
RDW: 14.5 % (ref 11.1–15.7)
WBC: 2.8 10e3/uL — ABNORMAL LOW (ref 3.9–10.0)

## 2014-05-20 MED ORDER — ACETAMINOPHEN 325 MG PO TABS
ORAL_TABLET | ORAL | Status: AC
  Filled 2014-05-20: qty 2

## 2014-05-20 MED ORDER — DIPHENHYDRAMINE HCL 25 MG PO CAPS
ORAL_CAPSULE | ORAL | Status: AC
Start: 2014-05-20 — End: 2014-05-20
  Filled 2014-05-20: qty 2

## 2014-05-20 MED ORDER — DIPHENHYDRAMINE HCL 25 MG PO CAPS
50.0000 mg | ORAL_CAPSULE | Freq: Once | ORAL | Status: AC
  Administered 2014-05-20: 50 mg via ORAL

## 2014-05-20 MED ORDER — SODIUM CHLORIDE 0.9 % IV SOLN
380.0000 mg/m2 | Freq: Once | INTRAVENOUS | Status: AC
  Administered 2014-05-20: 600 mg via INTRAVENOUS
  Filled 2014-05-20: qty 60

## 2014-05-20 MED ORDER — SODIUM CHLORIDE 0.9 % IV SOLN
70.0000 mg/m2 | Freq: Once | INTRAVENOUS | Status: AC
  Administered 2014-05-20: 100 mg via INTRAVENOUS
  Filled 2014-05-20: qty 4

## 2014-05-20 MED ORDER — SODIUM CHLORIDE 0.9 % IV SOLN
Freq: Once | INTRAVENOUS | Status: AC
  Administered 2014-05-20: 10:00:00 via INTRAVENOUS
  Filled 2014-05-20: qty 4

## 2014-05-20 MED ORDER — ACETAMINOPHEN 325 MG PO TABS
650.0000 mg | ORAL_TABLET | Freq: Once | ORAL | Status: AC
  Administered 2014-05-20: 650 mg via ORAL

## 2014-05-20 MED ORDER — SODIUM CHLORIDE 0.9 % IV SOLN
Freq: Once | INTRAVENOUS | Status: AC
  Administered 2014-05-20: 09:00:00 via INTRAVENOUS

## 2014-05-20 NOTE — Progress Notes (Signed)
Hematology and Oncology Follow Up Visit  Malayja Freund [id number] 02-06-1928 81 y.o. 05/20/2014   Principle Diagnosis:  Marginal zone lymphoma  Current Therapy:   Status post cycle 4 of Rituxan/Treanda    Interim History: Ms.Heiberger is here today for a follow-up. She is feeling better. She still has some fatigue at times.  Her PCP increased her Toprol and she has not had any more bouts with atrial fib.  Her PET scan in December showed a nice response to treatment. No new disease. We will schedule another within the next week or so to restage after cycle 6.  No issue with infections. She denies fever, chills, n/v, cough, rash, headaches, dizziness, SOB, chest pain, palpitations, abdominal pain, constipation, diarrhea, blood in urine or stool. No episodes of bleeding.  No swelling, tenderness, numbness or tingling in her extremities. No new aches or pains.  Her appetite is good and she is staying hydrated. Her weight is up 4 lbs since her last visit.    Medications:    Medication List       This list is accurate as of: 05/20/14  9:01 AM.  Always use your most recent med list.               clindamycin 1 % lotion  Commonly known as:  CLEOCIN-T  Apply topically 2 (two) times daily.     dexamethasone 4 MG tablet  Commonly known as:  DECADRON  Take 2 tablets (8 mg total) by mouth 2 (two) times daily with a meal. Start the day after chemotherapy for 2 days. Take with food.     ELIQUIS 2.5 MG Tabs tablet  Generic drug:  apixaban  Take 2.5 mg by mouth 2 (two) times daily.     famciclovir 500 MG tablet  Commonly known as:  FAMVIR  Take 1 tablet (500 mg total) by mouth daily.     metoprolol succinate 25 MG 24 hr tablet  Commonly known as:  TOPROL-XL  Take 25 mg by mouth 2 (two) times daily.     mirtazapine 30 MG tablet  Commonly known as:  REMERON  Take 0.5 tablets (15 mg total) by mouth at bedtime as needed.     MULTI-VITAMINS Tabs  Take by mouth every morning.     ondansetron 8 MG tablet  Commonly known as:  ZOFRAN  Take 1 tablet (8 mg total) by mouth 2 (two) times daily. Start the day after chemo for 2 days. Then take as needed for nausea or vomiting.     PRILOSEC 40 MG capsule  Generic drug:  omeprazole  Take 40 mg by mouth daily. PRN        Allergies: No Known Allergies  Past Medical History, Surgical history, Social history, and Family History were reviewed and updated.  Review of Systems: All other 10 point review of systems is negative.   Physical Exam:  height is 5\' 1"  (1.549 m) and weight is 118 lb (53.524 kg). Her oral temperature is 97.3 F (36.3 C). Her blood pressure is 122/65 and her pulse is 52. Her respiration is 14.   Wt Readings from Last 3 Encounters:  05/20/14 118 lb (53.524 kg)  04/15/14 114 lb (51.71 kg)  03/18/14 113 lb 9.6 oz (51.529 kg)    Ocular: Sclerae unicteric, pupils equal, round and reactive to light Ear-nose-throat: Oropharynx clear, dentition fair Lymphatic: No cervical or supraclavicular adenopathy Lungs no rales or rhonchi, good excursion bilaterally Heart regular rate and rhythm, no murmur appreciated Abd  soft, nontender, positive bowel sounds MSK no focal spinal tenderness, no joint edema Neuro: non-focal, well-oriented, appropriate affect Breasts: Deferred  Lab Results  Component Value Date   WBC 2.8* 05/20/2014   HGB 12.2 05/20/2014   HCT 37.5 05/20/2014   MCV 96 05/20/2014   PLT 156 05/20/2014   No results found for: FERRITIN, IRON, TIBC, UIBC, IRONPCTSAT Lab Results  Component Value Date   RBC 3.91 05/20/2014   No results found for: KPAFRELGTCHN, LAMBDASER, KAPLAMBRATIO No results found for: IGGSERUM, IGA, IGMSERUM No results found for: Odetta Pink, SPEI   Chemistry      Component Value Date/Time   NA 147* 05/20/2014 0814   K 4.4 05/20/2014 0814   CL 107 05/20/2014 0814   CO2 26 05/20/2014 0814   BUN 20 05/20/2014 0814    CREATININE 0.8 05/20/2014 0814      Component Value Date/Time   CALCIUM 9.2 05/20/2014 0814   ALKPHOS 69 05/20/2014 0814   AST 31 05/20/2014 0814   ALT 12 05/20/2014 0814   BILITOT 0.80 05/20/2014 0814     Impression and Plan: Ms. Ruud is 81 year old white female with marginal zone lymphoma. She has abdominal disease that is fairly extensive. She is feeling better today and is asymptomatic at this time.  Her PET scan in December showed a good response to treatment and no new disease. We will repeat her scan in the next week or so to restage.  She will get cycle 6 of Rituxan/Treanda today. We plan on completing 6 cycles of chemotherapy and then using Rituxan as maintenance. We will give her a new appointment schedule today.  She knows to call here with any questions or concerns and to go to the ED in the event of an emergency. We can certainly see her sooner if needed.    Eliezer Bottom, NP 4/6/20169:01 AM

## 2014-05-20 NOTE — Patient Instructions (Signed)
[facility] Discharge Instructions for Patients Receiving Chemotherapy  Today you received the following chemotherapy agents Rituxan and Treanda.  To help prevent nausea and vomiting after your treatment, we encourage you to take your nausea medication as prescribed.    If you develop nausea and vomiting that is not controlled by your nausea medication, call the clinic.   BELOW ARE SYMPTOMS THAT SHOULD BE REPORTED IMMEDIATELY:  *FEVER GREATER THAN 100.5 F  *CHILLS WITH OR WITHOUT FEVER  NAUSEA AND VOMITING THAT IS NOT CONTROLLED WITH YOUR NAUSEA MEDICATION  *UNUSUAL SHORTNESS OF BREATH  *UNUSUAL BRUISING OR BLEEDING  TENDERNESS IN MOUTH AND THROAT WITH OR WITHOUT PRESENCE OF ULCERS  *URINARY PROBLEMS  *BOWEL PROBLEMS  UNUSUAL RASH Items with * indicate a potential emergency and should be followed up as soon as possible.  Feel free to call the clinic you have any questions or concerns. The clinic phone number is [phone].

## 2014-05-21 ENCOUNTER — Ambulatory Visit (HOSPITAL_BASED_OUTPATIENT_CLINIC_OR_DEPARTMENT_OTHER): Payer: Medicare Other

## 2014-05-21 DIAGNOSIS — Z5111 Encounter for antineoplastic chemotherapy: Secondary | ICD-10-CM | POA: Diagnosis not present

## 2014-05-21 DIAGNOSIS — C8583 Other specified types of non-Hodgkin lymphoma, intra-abdominal lymph nodes: Secondary | ICD-10-CM

## 2014-05-21 MED ORDER — SODIUM CHLORIDE 0.9 % IV SOLN
70.0000 mg/m2 | Freq: Once | INTRAVENOUS | Status: AC
  Administered 2014-05-21: 100 mg via INTRAVENOUS
  Filled 2014-05-21: qty 4

## 2014-05-21 MED ORDER — SODIUM CHLORIDE 0.9 % IV SOLN
Freq: Once | INTRAVENOUS | Status: AC
  Administered 2014-05-21: 11:00:00 via INTRAVENOUS
  Filled 2014-05-21: qty 4

## 2014-05-21 MED ORDER — SODIUM CHLORIDE 0.9 % IV SOLN: 70.0000 mg/m2 | Freq: Once | INTRAVENOUS | Status: DC

## 2014-05-21 MED ORDER — SODIUM CHLORIDE 0.9 % IV SOLN
Freq: Once | INTRAVENOUS | Status: AC
  Administered 2014-05-21: 10:00:00 via INTRAVENOUS

## 2014-05-21 NOTE — Patient Instructions (Signed)
Bendamustine Injection What is this medicine? BENDAMUSTINE (BEN da MUS teen) is a chemotherapy drug. It is used to treat chronic lymphocytic leukemia and non-Hodgkin lymphoma. This medicine may be used for other purposes; ask your health care provider or pharmacist if you have questions. COMMON BRAND NAME(S): Treanda What should I tell my health care provider before I take this medicine? They need to know if you have any of these conditions: -kidney disease -liver disease -an unusual or allergic reaction to bendamustine, mannitol, other medicines, foods, dyes, or preservatives -pregnant or trying to get pregnant -breast-feeding How should I use this medicine? This medicine is for infusion into a vein. It is given by a health care professional in a hospital or clinic setting. Talk to your pediatrician regarding the use of this medicine in children. Special care may be needed. Overdosage: If you think you have taken too much of this medicine contact a poison control center or emergency room at once. NOTE: This medicine is only for you. Do not share this medicine with others. What if I miss a dose? It is important not to miss your dose. Call your doctor or health care professional if you are unable to keep an appointment. What may interact with this medicine? Do not take this medicine with any of the following medications: -clozapine This medicine may also interact with the following medications: -atazanavir -cimetidine -ciprofloxacin -enoxacin -fluvoxamine -medicines for seizures like carbamazepine and phenobarbital -mexiletine -rifampin -tacrine -thiabendazole -zileuton This list may not describe all possible interactions. Give your health care provider a list of all the medicines, herbs, non-prescription drugs, or dietary supplements you use. Also tell them if you smoke, drink alcohol, or use illegal drugs. Some items may interact with your medicine. What should I watch for while  using this medicine? Your condition will be monitored carefully while you are receiving this medicine. This drug may make you feel generally unwell. This is not uncommon, as chemotherapy can affect healthy cells as well as cancer cells. Report any side effects. Continue your course of treatment even though you feel ill unless your doctor tells you to stop. Call your doctor or health care professional for advice if you get a fever, chills or sore throat, or other symptoms of a cold or flu. Do not treat yourself. This drug decreases your body's ability to fight infections. Try to avoid being around people who are sick. This medicine may increase your risk to bruise or bleed. Call your doctor or health care professional if you notice any unusual bleeding. Be careful brushing and flossing your teeth or using a toothpick because you may get an infection or bleed more easily. If you have any dental work done, tell your dentist you are receiving this medicine. Avoid taking products that contain aspirin, acetaminophen, ibuprofen, naproxen, or ketoprofen unless instructed by your doctor. These medicines may hide a fever. Do not become pregnant while taking this medicine. Women should inform their doctor if they wish to become pregnant or think they might be pregnant. There is a potential for serious side effects to an unborn child. Men should inform their doctors if they wish to father a child. This medicine may lower sperm counts. Talk to your health care professional or pharmacist for more information. Do not breast-feed an infant while taking this medicine. What side effects may I notice from receiving this medicine? Side effects that you should report to your doctor or health care professional as soon as possible: -allergic reactions like skin rash,   itching or hives, swelling of the face, lips, or tongue -low blood counts - this medicine may decrease the number of white blood cells, red blood cells and  platelets. You may be at increased risk for infections and bleeding. -signs of infection - fever or chills, cough, sore throat, pain or difficulty passing urine -signs of decreased platelets or bleeding - bruising, pinpoint red spots on the skin, black, tarry stools, blood in the urine -signs of decreased red blood cells - unusually weak or tired, fainting spells, lightheadedness -trouble passing urine or change in the amount of urine Side effects that usually do not require medical attention (report to your doctor or health care professional if they continue or are bothersome): -diarrhea This list may not describe all possible side effects. Call your doctor for medical advice about side effects. You may report side effects to FDA at 1-800-FDA-1088. Where should I keep my medicine? This drug is given in a hospital or clinic and will not be stored at home. NOTE: This sheet is a summary. It may not cover all possible information. If you have questions about this medicine, talk to your doctor, pharmacist, or health care provider.  2015, Elsevier/Gold Standard. (2011-04-28 14:15:47)  

## 2014-06-01 ENCOUNTER — Ambulatory Visit (HOSPITAL_COMMUNITY): Payer: Medicare Other

## 2014-06-02 ENCOUNTER — Ambulatory Visit (HOSPITAL_COMMUNITY)
Admission: RE | Admit: 2014-06-02 | Discharge: 2014-06-02 | Disposition: A | Discharge status: Home / Self Care | Payer: Medicare Other | Source: Ambulatory Visit | Attending: Family | Admitting: Family

## 2014-06-02 DIAGNOSIS — C8583 Other specified types of non-Hodgkin lymphoma, intra-abdominal lymph nodes: Secondary | ICD-10-CM | POA: Insufficient documentation

## 2014-06-02 LAB — GLUCOSE, CAPILLARY: Glucose-Capillary: 82 mg/dL (ref 70–99)

## 2014-06-02 MED ORDER — FLUDEOXYGLUCOSE F - 18 (FDG) INJECTION
5.8300 | Freq: Once | INTRAVENOUS | Status: AC | PRN
  Administered 2014-06-02: 5.83 via INTRAVENOUS

## 2014-06-04 ENCOUNTER — Telehealth: Payer: Self-pay

## 2014-06-04 NOTE — Telephone Encounter (Deleted)
Re: previous note -   Spoke to Dr Marin Olp about [name]'s questions. After reviewing pt's notes, Dr Marin Olp states pt can being maintenance Rituxan at next appt in May. [name] verbalizes appreciation for the call back. dph

## 2014-06-04 NOTE — Telephone Encounter (Addendum)
-----   Message from [name], MD sent at 06/03/2014  7:02 AM EDT ----- Please call and tell her that the PET scan is normal now. There is no obvious active lymphoma thanks  Above message given to pt's daughter [name]. [name] questions if pt will have to continue with chemo as scheduled if scan was normal.   Per Dr Marin Olp, pt will begin maintenance Rituxan at next appt. dph

## 2014-06-17 ENCOUNTER — Encounter: Payer: Self-pay | Admitting: Hematology & Oncology

## 2014-06-17 ENCOUNTER — Ambulatory Visit (HOSPITAL_BASED_OUTPATIENT_CLINIC_OR_DEPARTMENT_OTHER): Payer: Medicare Other

## 2014-06-17 ENCOUNTER — Other Ambulatory Visit (HOSPITAL_BASED_OUTPATIENT_CLINIC_OR_DEPARTMENT_OTHER): Payer: Medicare Other

## 2014-06-17 ENCOUNTER — Ambulatory Visit (HOSPITAL_BASED_OUTPATIENT_CLINIC_OR_DEPARTMENT_OTHER): Payer: Medicare Other | Admitting: Hematology & Oncology

## 2014-06-17 VITALS — BP 131/92 | HR 76 | Temp 98.2°F | Resp 18

## 2014-06-17 VITALS — BP 132/80 | HR 90 | Temp 97.4°F | Resp 16 | Ht 61.0 in | Wt 113.0 lb

## 2014-06-17 DIAGNOSIS — Z5112 Encounter for antineoplastic immunotherapy: Secondary | ICD-10-CM

## 2014-06-17 DIAGNOSIS — I4891 Unspecified atrial fibrillation: Secondary | ICD-10-CM

## 2014-06-17 DIAGNOSIS — C8583 Other specified types of non-Hodgkin lymphoma, intra-abdominal lymph nodes: Secondary | ICD-10-CM | POA: Diagnosis not present

## 2014-06-17 LAB — CBC WITH DIFFERENTIAL (CANCER CENTER ONLY)
BASO#: 0 10*3/uL (ref 0.0–0.2)
BASO%: 1.1 % (ref 0.0–2.0)
EOS%: 7.8 % — AB (ref 0.0–7.0)
Eosinophils Absolute: 0.3 10*3/uL (ref 0.0–0.5)
HCT: 43.2 % (ref 34.8–46.6)
HGB: 14 g/dL (ref 11.6–15.9)
LYMPH#: 0.5 10*3/uL — AB (ref 0.9–3.3)
LYMPH%: 14 % (ref 14.0–48.0)
MCH: 31 pg (ref 26.0–34.0)
MCHC: 32.4 g/dL (ref 32.0–36.0)
MCV: 96 fL (ref 81–101)
MONO#: 0.4 10*3/uL (ref 0.1–0.9)
MONO%: 12.3 % (ref 0.0–13.0)
NEUT%: 64.8 % (ref 39.6–80.0)
NEUTROS ABS: 2.3 10*3/uL (ref 1.5–6.5)
PLATELETS: 204 10*3/uL (ref 145–400)
RBC: 4.52 10*6/uL (ref 3.70–5.32)
RDW: 14 % (ref 11.1–15.7)
WBC: 3.6 10*3/uL — ABNORMAL LOW (ref 3.9–10.0)

## 2014-06-17 LAB — CMP (CANCER CENTER ONLY)
ALBUMIN: 3.4 g/dL (ref 3.3–5.5)
ALK PHOS: 92 U/L — AB (ref 26–84)
ALT: 15 U/L (ref 10–47)
AST: 32 U/L (ref 11–38)
BILIRUBIN TOTAL: 0.9 mg/dL (ref 0.20–1.60)
BUN: 15 mg/dL (ref 7–22)
CO2: 28 meq/L (ref 18–33)
Calcium: 9.7 mg/dL (ref 8.0–10.3)
Chloride: 108 mEq/L (ref 98–108)
Creat: 1 mg/dl (ref 0.6–1.2)
GLUCOSE: 96 mg/dL (ref 73–118)
Potassium: 4.2 mEq/L (ref 3.3–4.7)
SODIUM: 143 meq/L (ref 128–145)
TOTAL PROTEIN: 6 g/dL — AB (ref 6.4–8.1)

## 2014-06-17 LAB — LACTATE DEHYDROGENASE: LDH: 165 U/L (ref 94–250)

## 2014-06-17 MED ORDER — SODIUM CHLORIDE 0.9 % IV SOLN
375.0000 mg/m2 | Freq: Once | INTRAVENOUS | Status: AC
Start: 2014-06-17 — End: 2014-06-17
  Administered 2014-06-17: 600 mg via INTRAVENOUS
  Filled 2014-06-17: qty 60

## 2014-06-17 MED ORDER — ACETAMINOPHEN 325 MG PO TABS
ORAL_TABLET | ORAL | Status: AC
  Filled 2014-06-17: qty 2

## 2014-06-17 MED ORDER — ACETAMINOPHEN 325 MG PO TABS
650.0000 mg | ORAL_TABLET | Freq: Once | ORAL | Status: AC
  Administered 2014-06-17: 650 mg via ORAL

## 2014-06-17 MED ORDER — SODIUM CHLORIDE 0.9 % IV SOLN
375.0000 mg/m2 | Freq: Once | INTRAVENOUS | Status: DC
  Filled 2014-06-17: qty 60

## 2014-06-17 MED ORDER — SODIUM CHLORIDE 0.9 % IV SOLN
Freq: Once | INTRAVENOUS | Status: AC
  Administered 2014-06-17: 09:00:00 via INTRAVENOUS

## 2014-06-17 MED ORDER — DIPHENHYDRAMINE HCL 25 MG PO CAPS
ORAL_CAPSULE | ORAL | Status: AC
  Filled 2014-06-17: qty 2

## 2014-06-17 MED ORDER — DIPHENHYDRAMINE HCL 25 MG PO CAPS
50.0000 mg | ORAL_CAPSULE | Freq: Once | ORAL | Status: AC
Start: 2014-06-17 — End: 2014-06-17
  Administered 2014-06-17: 50 mg via ORAL

## 2014-06-17 NOTE — Patient Instructions (Signed)

## 2014-06-17 NOTE — Progress Notes (Signed)
Hematology and Oncology Follow Up Visit  Kathy Massey [id number] 05-04-1927 81 y.o. 06/17/2014   Principle Diagnosis:   Marginal zone lymphoma  Current Therapy:    Status post cycle 6 of Rituxan/Treanda  Patient to start maintenance Rituxan every 3 months     Interim History:  [name] is back for follow-up. She had he tolerated her chemotherapy quite well. She has done quite well with treatment. Her last scans which were done recently, showed no evidence of residual lymphoma.  Her main problem has been her heart. She has atrial fibrillation. She is on ELIQUIS for this.  There's been no bleeding.  She feels well. Her appetite is good. She's had no problems with bowels or bladder. She's had no leg swelling. She's had no rashes.   Overall, her performance status is ECOG 1.   Medications:  Current outpatient prescriptions:  .  apixaban (ELIQUIS) 2.5 MG TABS tablet, Take 2.5 mg by mouth 2 (two) times daily. , Disp: , Rfl:  .  clindamycin (CLEOCIN-T) 1 % lotion, Apply topically 2 (two) times daily., Disp: 60 mL, Rfl: 3 .  metoprolol succinate (TOPROL-XL) 25 MG 24 hr tablet, Take 25 mg by mouth 2 (two) times daily. , Disp: , Rfl:  .  mirtazapine (REMERON) 30 MG tablet, Take 0.5 tablets (15 mg total) by mouth at bedtime as needed., Disp: 5 tablet, Rfl: 0 .  Multiple Vitamin (MULTI-VITAMINS) TABS, Take by mouth every morning. , Disp: , Rfl:  .  omeprazole (PRILOSEC) 40 MG capsule, Take 40 mg by mouth daily. PRN, Disp: , Rfl:  .  dexamethasone (DECADRON) 4 MG tablet, Take 2 tablets (8 mg total) by mouth 2 (two) times daily with a meal. Start the day after chemotherapy for 2 days. Take with food. (Patient not taking: Reported on 06/17/2014), Disp: 30 tablet, Rfl: 3 .  famciclovir (FAMVIR) 500 MG tablet, Take 1 tablet (500 mg total) by mouth daily. (Patient not taking: Reported on 06/17/2014), Disp: 30 tablet, Rfl: 6 .  ondansetron (ZOFRAN) 8 MG tablet, Take 1 tablet (8 mg total) by mouth 2  (two) times daily. Start the day after chemo for 2 days. Then take as needed for nausea or vomiting. (Patient not taking: Reported on 06/17/2014), Disp: 30 tablet, Rfl: 3  Allergies: No Known Allergies  Past Medical History, Surgical history, Social history, and Family History were reviewed and updated.  Review of Systems: As above  Physical Exam:  height is 5\' 1"  (1.549 m) and weight is 113 lb (51.256 kg). Her oral temperature is 97.4 F (36.3 C). Her blood pressure is 132/80 and her pulse is 90. Her respiration is 16.   Elderly, petite white female in no obvious distress. Head and neck exam shows no ocular or oral lesions. There are no palpable cervical or supraclavicular lymph nodes. Lungs are clear. Cardiac exam tachycardic and irregular, consistent with atrial fibrillation. There are no murmurs, rubs or bruits. Abdomen is soft. She has good bowel sounds. There is no fluid wave. There is no palpable abdominal mass. There is no palpable liver or spleen tip. Back exam shows no tenderness over the spine, ribs or hips. She has some slight kyphosis. Extremities shows no clubbing, cyanosis or edema. She has age related osteo-arthritic changes in her joints. She has decent strength in her extremities. Skin exam shows scattered areas that appear to be consistent with squamous cell or basal cell carcinomas. Neurological exam is nonfocal.  Lab Results  Component Value Date  WBC 3.6* 06/17/2014   HGB 14.0 06/17/2014   HCT 43.2 06/17/2014   MCV 96 06/17/2014   PLT 204 06/17/2014     Chemistry      Component Value Date/Time   NA 147* 05/20/2014 0814   K 4.4 05/20/2014 0814   CL 107 05/20/2014 0814   CO2 26 05/20/2014 0814   BUN 20 05/20/2014 0814   CREATININE 0.8 05/20/2014 0814      Component Value Date/Time   CALCIUM 9.2 05/20/2014 0814   ALKPHOS 69 05/20/2014 0814   AST 31 05/20/2014 0814   ALT 12 05/20/2014 0814   BILITOT 0.80 05/20/2014 0814         Impression and Plan: [name] is 81 year old white female. She has marginal zone lymphoma. She has abdominal disease that is fairly extensive.  She has had a very nice response to treatment. As such, I think only 6 cycles would be needed.  We now will draw maintenance Rituxan. I think this will be Idea for her. She will be able to tolerate this. Should not affect her age or fibrillation.  We will plan for management [place] every 3 months. F we will plan to get her back in 3 months.  Cassell Smiles, MD 5/4/20168:58 AM

## 2014-06-18 ENCOUNTER — Ambulatory Visit: Payer: Medicare Other

## 2014-09-09 ENCOUNTER — Other Ambulatory Visit (HOSPITAL_BASED_OUTPATIENT_CLINIC_OR_DEPARTMENT_OTHER): Payer: Medicare Other

## 2014-09-09 ENCOUNTER — Encounter: Payer: Self-pay | Admitting: Hematology & Oncology

## 2014-09-09 ENCOUNTER — Ambulatory Visit (HOSPITAL_BASED_OUTPATIENT_CLINIC_OR_DEPARTMENT_OTHER): Payer: Medicare Other | Admitting: Hematology & Oncology

## 2014-09-09 ENCOUNTER — Ambulatory Visit (HOSPITAL_BASED_OUTPATIENT_CLINIC_OR_DEPARTMENT_OTHER): Payer: Medicare Other

## 2014-09-09 VITALS — BP 112/80 | HR 76 | Temp 98.4°F | Resp 18

## 2014-09-09 VITALS — BP 118/70 | HR 81 | Resp 16 | Ht 61.0 in | Wt 114.0 lb

## 2014-09-09 DIAGNOSIS — C8583 Other specified types of non-Hodgkin lymphoma, intra-abdominal lymph nodes: Secondary | ICD-10-CM | POA: Diagnosis not present

## 2014-09-09 DIAGNOSIS — Z5112 Encounter for antineoplastic immunotherapy: Secondary | ICD-10-CM

## 2014-09-09 DIAGNOSIS — M109 Gout, unspecified: Secondary | ICD-10-CM | POA: Insufficient documentation

## 2014-09-09 LAB — CMP (CANCER CENTER ONLY)
ALT(SGPT): 11 U/L (ref 10–47)
AST: 29 U/L (ref 11–38)
Albumin: 3.7 g/dL (ref 3.3–5.5)
Alkaline Phosphatase: 102 U/L — ABNORMAL HIGH (ref 26–84)
BUN, Bld: 18 mg/dL (ref 7–22)
CO2: 25 mEq/L (ref 18–33)
Calcium: 9.5 mg/dL (ref 8.0–10.3)
Chloride: 109 mEq/L — ABNORMAL HIGH (ref 98–108)
Creat: 1.1 mg/dl (ref 0.6–1.2)
Glucose, Bld: 96 mg/dL (ref 73–118)
POTASSIUM: 4.2 meq/L (ref 3.3–4.7)
Sodium: 142 mEq/L (ref 128–145)
TOTAL PROTEIN: 6.9 g/dL (ref 6.4–8.1)
Total Bilirubin: 1.1 mg/dl (ref 0.20–1.60)

## 2014-09-09 LAB — CBC WITH DIFFERENTIAL (CANCER CENTER ONLY)
BASO#: 0 10*3/uL (ref 0.0–0.2)
BASO%: 0.7 % (ref 0.0–2.0)
EOS ABS: 0.2 10*3/uL (ref 0.0–0.5)
EOS%: 4.3 % (ref 0.0–7.0)
HEMATOCRIT: 45.2 % (ref 34.8–46.6)
HGB: 14.9 g/dL (ref 11.6–15.9)
LYMPH#: 0.4 10*3/uL — ABNORMAL LOW (ref 0.9–3.3)
LYMPH%: 8.9 % — ABNORMAL LOW (ref 14.0–48.0)
MCH: 30.5 pg (ref 26.0–34.0)
MCHC: 33 g/dL (ref 32.0–36.0)
MCV: 93 fL (ref 81–101)
MONO#: 0.3 10*3/uL (ref 0.1–0.9)
MONO%: 7.4 % (ref 0.0–13.0)
NEUT#: 3.3 10*3/uL (ref 1.5–6.5)
NEUT%: 78.7 % (ref 39.6–80.0)
Platelets: 196 10*3/uL (ref 145–400)
RBC: 4.88 10*6/uL (ref 3.70–5.32)
RDW: 13.6 % (ref 11.1–15.7)
WBC: 4.2 10*3/uL (ref 3.9–10.0)

## 2014-09-09 LAB — LACTATE DEHYDROGENASE: LDH: 159 U/L (ref 94–250)

## 2014-09-09 MED ORDER — DIPHENHYDRAMINE HCL 25 MG PO CAPS
ORAL_CAPSULE | ORAL | Status: AC
  Filled 2014-09-09: qty 2

## 2014-09-09 MED ORDER — ACETAMINOPHEN 325 MG PO TABS
650.0000 mg | ORAL_TABLET | Freq: Once | ORAL | Status: AC
  Administered 2014-09-09: 650 mg via ORAL

## 2014-09-09 MED ORDER — DIPHENHYDRAMINE HCL 25 MG PO CAPS
50.0000 mg | ORAL_CAPSULE | Freq: Once | ORAL | Status: AC
  Administered 2014-09-09: 50 mg via ORAL

## 2014-09-09 MED ORDER — HEPARIN SOD (PORK) LOCK FLUSH 100 UNIT/ML IV SOLN
500.0000 [IU] | Freq: Once | INTRAVENOUS | Status: DC | PRN
  Filled 2014-09-09: qty 5

## 2014-09-09 MED ORDER — SODIUM CHLORIDE 0.9 % IJ SOLN
10.0000 mL | INTRAMUSCULAR | Status: DC | PRN
  Filled 2014-09-09: qty 10

## 2014-09-09 MED ORDER — SODIUM CHLORIDE 0.9 % IV SOLN
Freq: Once | INTRAVENOUS | Status: AC
  Administered 2014-09-09: 10:00:00 via INTRAVENOUS

## 2014-09-09 MED ORDER — ACETAMINOPHEN 325 MG PO TABS
ORAL_TABLET | ORAL | Status: AC
  Filled 2014-09-09: qty 2

## 2014-09-09 MED ORDER — SODIUM CHLORIDE 0.9 % IV SOLN
375.0000 mg/m2 | Freq: Once | INTRAVENOUS | Status: AC
  Administered 2014-09-09: 600 mg via INTRAVENOUS
  Filled 2014-09-09: qty 60

## 2014-09-09 NOTE — Patient Instructions (Signed)

## 2014-09-09 NOTE — Progress Notes (Signed)
Hematology and Oncology Follow Up Visit  Kathy Massey [id number] 21-Sep-1927 81 y.o. 09/09/2014   Principle Diagnosis:   Marginal zone lymphoma  Current Therapy:    Status post cycle 6 of Rituxan/Treanda  Patient status post cycle 1 of maintenance Rituxan every 3 months     Interim History:  [name] is back for follow-up. She had tolerated the Rituxan well.  The problem that she now has is that she has basal cell carcinomas have been taken off. She has a wrap on her left lower leg. She apparently had a Mohs surgery for a basal cell carcinoma that became infected. This was a week ago.  She gets around a little bit more feebly. Patient also has what looks like a cyst on the left olecranon bursa. We did try to drain this but was not successful.  Otherwise, she is doing okay. She is eating okay. She's had no nausea or vomiting.  Her atrial fibrillation has not been out of control. She is on low-dose ELIQUIS for this.    Overall, her performance status is ECOG 1.   Medications:  Current outpatient prescriptions:  .  apixaban (ELIQUIS) 2.5 MG TABS tablet, Take 2.5 mg by mouth 2 (two) times daily. , Disp: , Rfl:  .  clindamycin (CLEOCIN T) 1 % lotion, Apply topically., Disp: , Rfl:  .  metoprolol succinate (TOPROL XL) 25 MG 24 hr tablet, TAKE 1 TO 2 TABLETS TWICE A DAY, Disp: , Rfl:  .  mirtazapine (REMERON) 30 MG tablet, Take 0.5 tablets (15 mg total) by mouth at bedtime as needed., Disp: 5 tablet, Rfl: 0 .  Multiple Vitamin (MULTI-VITAMINS) TABS, Take by mouth every morning. , Disp: , Rfl:  .  omeprazole (PRILOSEC) 40 MG capsule, Take 40 mg by mouth daily. PRN, Disp: , Rfl:  No current facility-administered medications for this visit.  Facility-Administered Medications Ordered in Other Visits:  .  0.9 %  sodium chloride infusion, , Intravenous, Once, [name], MD .  heparin lock flush 100 unit/mL, 500 Units, Intracatheter, Once PRN, [name], MD .  riTUXimab  (RITUXAN) 600 mg in sodium chloride 0.9 % 190 mL chemo infusion, 375 mg/m2 (Treatment Plan Actual), Intravenous, Once, [name], MD .  sodium chloride 0.9 % injection 10 mL, 10 mL, Intracatheter, PRN, [name], MD  Allergies: No Known Allergies  Past Medical History, Surgical history, Social history, and Family History were reviewed and updated.  Review of Systems: As above  Physical Exam:  height is 5\' 1"  (1.549 m) and weight is 114 lb (51.71 kg). Her blood pressure is 118/70 and her pulse is 81. Her respiration is 16.   Elderly, petite white female in no obvious distress. Head and neck exam shows no ocular or oral lesions. There are no palpable cervical or supraclavicular lymph nodes. Lungs are clear. Cardiac exam tachycardic and irregular, consistent with atrial fibrillation. There are no murmurs, rubs or bruits. Abdomen is soft. She has good bowel sounds. There is no fluid wave. There is no palpable abdominal mass. There is no palpable liver or spleen tip. Back exam shows no tenderness over the spine, ribs or hips. She has some slight kyphosis. Extremities shows no clubbing, cyanosis or edema. She has a dressing on the left lower leg. She does have a cyst on the left olecranon bursa. This is soft. It is fluctuant. It is non-tender. She has age related osteo-arthritic changes in her joints. She has decent strength in  her extremities. Skin exam shows scattered areas that appear to be consistent with squamous cell or basal cell carcinomas. Neurological exam is nonfocal.  Lab Results  Component Value Date   WBC 4.2 09/09/2014   HGB 14.9 09/09/2014   HCT 45.2 09/09/2014   MCV 93 09/09/2014   PLT 196 09/09/2014     Chemistry      Component Value Date/Time   NA 142 09/09/2014 0835   K 4.2 09/09/2014 0835   CL 109* 09/09/2014 0835   CO2 25 09/09/2014 0835   BUN 18 09/09/2014 0835   CREATININE 1.1 09/09/2014 0835      Component Value Date/Time   CALCIUM 9.5 09/09/2014 0835     ALKPHOS 102* 09/09/2014 0835   AST 29 09/09/2014 0835   ALT 11 09/09/2014 0835   BILITOT 1.10 09/09/2014 0835         Impression and Plan: Kathy Massey is 81 year old white female. She has marginal zone lymphoma. She had abdominal disease that is fairly extensive.  She has had a very nice response to treatment. As such, I thought that only 6 cycles would be needed.  We will continue her on maintenance Rituxan. I think this will be Idea for her. She has tolerated this well. This has not affected her atrial fibrillation   We will plan for follow-up in  3 months.  Cassell Smiles, MD 7/27/20169:51 AM

## 2014-09-16 ENCOUNTER — Other Ambulatory Visit: Payer: Medicare Other

## 2014-09-16 ENCOUNTER — Ambulatory Visit: Payer: Medicare Other

## 2014-09-16 ENCOUNTER — Ambulatory Visit: Payer: Medicare Other | Admitting: Hematology & Oncology

## 2014-12-07 ENCOUNTER — Other Ambulatory Visit: Payer: Medicare Other

## 2014-12-07 ENCOUNTER — Ambulatory Visit: Payer: Medicare Other | Admitting: Hematology & Oncology

## 2014-12-07 ENCOUNTER — Ambulatory Visit: Payer: Medicare Other

## 2014-12-14 ENCOUNTER — Other Ambulatory Visit: Payer: Medicare Other

## 2014-12-14 ENCOUNTER — Ambulatory Visit: Payer: Medicare Other

## 2014-12-14 ENCOUNTER — Ambulatory Visit: Payer: Medicare Other | Admitting: Hematology & Oncology

## 2014-12-15 ENCOUNTER — Ambulatory Visit (HOSPITAL_BASED_OUTPATIENT_CLINIC_OR_DEPARTMENT_OTHER): Payer: Medicare Other

## 2014-12-15 ENCOUNTER — Other Ambulatory Visit (HOSPITAL_BASED_OUTPATIENT_CLINIC_OR_DEPARTMENT_OTHER): Payer: Medicare Other

## 2014-12-15 ENCOUNTER — Encounter: Payer: Self-pay | Admitting: Hematology & Oncology

## 2014-12-15 ENCOUNTER — Ambulatory Visit (HOSPITAL_BASED_OUTPATIENT_CLINIC_OR_DEPARTMENT_OTHER): Payer: Medicare Other | Admitting: Hematology & Oncology

## 2014-12-15 VITALS — BP 115/76 | HR 82 | Temp 98.2°F | Resp 18

## 2014-12-15 VITALS — BP 95/58 | HR 81 | Temp 97.3°F | Resp 16 | Ht 61.0 in | Wt 110.0 lb

## 2014-12-15 DIAGNOSIS — C8580 Other specified types of non-Hodgkin lymphoma, unspecified site: Secondary | ICD-10-CM | POA: Diagnosis not present

## 2014-12-15 DIAGNOSIS — Z5112 Encounter for antineoplastic immunotherapy: Secondary | ICD-10-CM

## 2014-12-15 DIAGNOSIS — C8583 Other specified types of non-Hodgkin lymphoma, intra-abdominal lymph nodes: Secondary | ICD-10-CM

## 2014-12-15 DIAGNOSIS — E559 Vitamin D deficiency, unspecified: Secondary | ICD-10-CM

## 2014-12-15 LAB — CMP (CANCER CENTER ONLY)
ALT(SGPT): 8 U/L — ABNORMAL LOW (ref 10–47)
AST: 26 U/L (ref 11–38)
Albumin: 3.1 g/dL — ABNORMAL LOW (ref 3.3–5.5)
Alkaline Phosphatase: 130 U/L — ABNORMAL HIGH (ref 26–84)
BUN: 18 mg/dL (ref 7–22)
CHLORIDE: 106 meq/L (ref 98–108)
CO2: 26 mEq/L (ref 18–33)
CREATININE: 1.1 mg/dL (ref 0.6–1.2)
Calcium: 9.6 mg/dL (ref 8.0–10.3)
GLUCOSE: 95 mg/dL (ref 73–118)
Potassium: 4.1 mEq/L (ref 3.3–4.7)
SODIUM: 145 meq/L (ref 128–145)
Total Bilirubin: 0.6 mg/dl (ref 0.20–1.60)
Total Protein: 6.9 g/dL (ref 6.4–8.1)

## 2014-12-15 LAB — CBC WITH DIFFERENTIAL (CANCER CENTER ONLY)
BASO#: 0.1 10*3/uL (ref 0.0–0.2)
BASO%: 2 % (ref 0.0–2.0)
EOS ABS: 0.2 10*3/uL (ref 0.0–0.5)
EOS%: 5.2 % (ref 0.0–7.0)
HCT: 40 % (ref 34.8–46.6)
HEMOGLOBIN: 12.5 g/dL (ref 11.6–15.9)
LYMPH#: 0.4 10*3/uL — ABNORMAL LOW (ref 0.9–3.3)
LYMPH%: 10.1 % — AB (ref 14.0–48.0)
MCH: 29.1 pg (ref 26.0–34.0)
MCHC: 31.3 g/dL — ABNORMAL LOW (ref 32.0–36.0)
MCV: 93 fL (ref 81–101)
MONO#: 0.3 10*3/uL (ref 0.1–0.9)
MONO%: 9 % (ref 0.0–13.0)
NEUT%: 73.7 % (ref 39.6–80.0)
NEUTROS ABS: 2.6 10*3/uL (ref 1.5–6.5)
PLATELETS: 418 10*3/uL — AB (ref 145–400)
RBC: 4.3 10*6/uL (ref 3.70–5.32)
RDW: 14.3 % (ref 11.1–15.7)
WBC: 3.5 10*3/uL — ABNORMAL LOW (ref 3.9–10.0)

## 2014-12-15 LAB — LACTATE DEHYDROGENASE: LDH: 124 U/L (ref 94–250)

## 2014-12-15 MED ORDER — SODIUM CHLORIDE 0.9 % IV SOLN
Freq: Once | INTRAVENOUS | Status: AC
  Administered 2014-12-15: 12:00:00 via INTRAVENOUS

## 2014-12-15 MED ORDER — DIPHENHYDRAMINE HCL 25 MG PO CAPS
50.0000 mg | ORAL_CAPSULE | Freq: Once | ORAL | Status: AC
  Administered 2014-12-15: 50 mg via ORAL

## 2014-12-15 MED ORDER — ACETAMINOPHEN 325 MG PO TABS
ORAL_TABLET | ORAL | Status: AC
  Filled 2014-12-15: qty 2

## 2014-12-15 MED ORDER — ACETAMINOPHEN 325 MG PO TABS
650.0000 mg | ORAL_TABLET | Freq: Once | ORAL | Status: AC
  Administered 2014-12-15: 650 mg via ORAL

## 2014-12-15 MED ORDER — SODIUM CHLORIDE 0.9 % IV SOLN
375.0000 mg/m2 | Freq: Once | INTRAVENOUS | Status: AC
  Administered 2014-12-15: 600 mg via INTRAVENOUS
  Filled 2014-12-15: qty 10

## 2014-12-15 MED ORDER — DIPHENHYDRAMINE HCL 25 MG PO CAPS
ORAL_CAPSULE | ORAL | Status: AC
  Filled 2014-12-15: qty 2

## 2014-12-15 NOTE — Patient Instructions (Signed)

## 2014-12-15 NOTE — Progress Notes (Signed)
Hematology and Oncology Follow Up Visit  Kanani Mowbray [id number] May 29, 1927 81 y.o. 12/15/2014   Principle Diagnosis:   Marginal zone lymphoma  Current Therapy:    Status post cycle 6 of Rituxan/Treanda  Patient status post cycle 2 of maintenance Rituxan every 3 months     Interim History:  Ms.  Knoche is back for follow-up. She has tolerated the Rituxan well.  She had a good summer. Thank you, nothing adverse happened to her. Surprisingly enough, her daughter had breast cancer. I think that this may have been either ductal carcinoma in situ or a very small invasive cancer. She did not require any chemotherapy. She was put on Arimidex and had radiation.  She still is on blood thinner for the atrial fibrillation. Her atrial fibrillation has not caused any problems for her. She's not been hospitalized.  There is not been any issues with bleeding.  She's had no leg swelling.  His been no change in bowel or bladder habits.  She's had no cough.  His been no fever.   Overall, her performance status is ECOG 1.   Medications:  Current outpatient prescriptions:  .  apixaban (ELIQUIS) 2.5 MG TABS tablet, Take 2.5 mg by mouth 2 (two) times daily. , Disp: , Rfl:  .  clindamycin (CLEOCIN T) 1 % lotion, Apply topically., Disp: , Rfl:  .  escitalopram (LEXAPRO) 10 MG tablet, Take 10 mg by mouth., Disp: , Rfl:  .  metoprolol succinate (TOPROL XL) 25 MG 24 hr tablet, TAKE 1 TO 2 TABLETS TWICE A DAY, Disp: , Rfl:  .  mirtazapine (REMERON) 30 MG tablet, Take 0.5 tablets (15 mg total) by mouth at bedtime as needed., Disp: 5 tablet, Rfl: 0 .  Multiple Vitamin (MULTI-VITAMINS) TABS, Take by mouth every morning. , Disp: , Rfl:  .  omeprazole (PRILOSEC) 40 MG capsule, Take 40 mg by mouth daily. PRN, Disp: , Rfl:   Allergies: No Known Allergies  Past Medical History, Surgical history, Social history, and Family History were reviewed and updated.  Review of Systems: As above  Physical  Exam:  height is 5\' 1"  (1.549 m) and weight is 110 lb (49.896 kg). Her oral temperature is 97.3 F (36.3 C). Her blood pressure is 95/58 and her pulse is 81. Her respiration is 16.   Elderly, petite white female in no obvious distress. Head and neck exam shows no ocular or oral lesions. There are no palpable cervical or supraclavicular lymph nodes. Lungs are clear. Cardiac exam tachycardic and irregular, consistent with atrial fibrillation. There are no murmurs, rubs or bruits. Abdomen is soft. She has good bowel sounds. There is no fluid wave. There is no palpable abdominal mass. There is no palpable liver or spleen tip. Back exam shows no tenderness over the spine, ribs or hips. She has some slight kyphosis. Extremities shows no clubbing, cyanosis or edema. She has a dressing on the left lower leg. She does have a cyst on the left olecranon bursa. This is soft. It is fluctuant. It is non-tender. She has age related osteo-arthritic changes in her joints. She has decent strength in her extremities. Skin exam shows scattered areas that appear to be consistent with squamous cell or basal cell carcinomas. Neurological exam is nonfocal.  Lab Results  Component Value Date   WBC 3.5* 12/15/2014   HGB 12.5 12/15/2014   HCT 40.0 12/15/2014   MCV 93 12/15/2014   PLT 418* 12/15/2014     Chemistry  Component Value Date/Time   NA 145 12/15/2014 1000   K 4.1 12/15/2014 1000   CL 106 12/15/2014 1000   CO2 26 12/15/2014 1000   BUN 18 12/15/2014 1000   CREATININE 1.1 12/15/2014 1000      Component Value Date/Time   CALCIUM 9.6 12/15/2014 1000   ALKPHOS 130* 12/15/2014 1000   AST 26 12/15/2014 1000   ALT 8* 12/15/2014 1000   BILITOT 0.60 12/15/2014 1000         Impression and Plan: Ms. Stallings is 81 year old white female. She has marginal zone lymphoma. She had abdominal disease that is fairly extensive.  She has had a very nice response to treatment. As such, I thought that only 6 cycles  would be needed.  We will continue her on maintenance Rituxan. I think this will be i deal for her. She has tolerated this well. This has not affected her atrial fibrillation   I'm little worried about her liver function test below bit elevated. As such, I will get her set up with a PET scan before we see her back. We'll get this set up in December.  We will plan for follow-up in  3 months.  I [name], MD 11/1/201611:13 AM

## 2015-02-18 ENCOUNTER — Ambulatory Visit (HOSPITAL_COMMUNITY): Payer: Medicare Other

## 2015-02-18 ENCOUNTER — Encounter (HOSPITAL_COMMUNITY)
Admission: RE | Admit: 2015-02-18 | Discharge: 2015-02-18 | Disposition: A | Discharge status: Home / Self Care | Payer: Medicare Other | Source: Ambulatory Visit | Attending: Hematology & Oncology | Admitting: Hematology & Oncology

## 2015-02-18 DIAGNOSIS — E559 Vitamin D deficiency, unspecified: Secondary | ICD-10-CM | POA: Diagnosis present

## 2015-02-18 DIAGNOSIS — C8583 Other specified types of non-Hodgkin lymphoma, intra-abdominal lymph nodes: Secondary | ICD-10-CM | POA: Insufficient documentation

## 2015-02-18 LAB — GLUCOSE, CAPILLARY: Glucose-Capillary: 83 mg/dL (ref 65–99)

## 2015-02-18 MED ORDER — FLUDEOXYGLUCOSE F - 18 (FDG) INJECTION
5.4500 | Freq: Once | INTRAVENOUS | Status: AC | PRN
  Administered 2015-02-18: 5.45 via INTRAVENOUS

## 2015-02-19 ENCOUNTER — Telehealth: Payer: Self-pay | Admitting: *Deleted

## 2015-02-19 NOTE — Telephone Encounter (Signed)
-----   Message from [name], MD sent at 02/18/2015  8:00 PM EST ----- Call - No active lymphoma !!!  Happy New Year!!! pete

## 2015-02-22 ENCOUNTER — Other Ambulatory Visit: Payer: Medicare Other

## 2015-02-22 ENCOUNTER — Ambulatory Visit: Payer: Medicare Other | Admitting: Hematology & Oncology

## 2015-02-22 ENCOUNTER — Ambulatory Visit: Payer: Medicare Other

## 2015-03-15 ENCOUNTER — Other Ambulatory Visit: Payer: Medicare Other

## 2015-03-15 ENCOUNTER — Ambulatory Visit: Payer: Medicare Other | Admitting: Hematology & Oncology

## 2015-03-15 ENCOUNTER — Ambulatory Visit: Payer: Medicare Other

## 2015-03-16 ENCOUNTER — Ambulatory Visit (HOSPITAL_BASED_OUTPATIENT_CLINIC_OR_DEPARTMENT_OTHER): Payer: Medicare Other

## 2015-03-16 ENCOUNTER — Ambulatory Visit (HOSPITAL_BASED_OUTPATIENT_CLINIC_OR_DEPARTMENT_OTHER): Payer: Medicare Other | Admitting: Hematology & Oncology

## 2015-03-16 ENCOUNTER — Encounter: Payer: Self-pay | Admitting: Hematology & Oncology

## 2015-03-16 ENCOUNTER — Other Ambulatory Visit (HOSPITAL_BASED_OUTPATIENT_CLINIC_OR_DEPARTMENT_OTHER): Payer: Medicare Other

## 2015-03-16 VITALS — BP 98/49 | HR 63 | Temp 97.8°F | Resp 16

## 2015-03-16 VITALS — BP 106/66 | HR 90 | Temp 97.3°F | Resp 16 | Ht 61.0 in | Wt 115.0 lb

## 2015-03-16 DIAGNOSIS — E559 Vitamin D deficiency, unspecified: Secondary | ICD-10-CM

## 2015-03-16 DIAGNOSIS — Z5112 Encounter for antineoplastic immunotherapy: Secondary | ICD-10-CM | POA: Diagnosis not present

## 2015-03-16 DIAGNOSIS — C8583 Other specified types of non-Hodgkin lymphoma, intra-abdominal lymph nodes: Secondary | ICD-10-CM

## 2015-03-16 DIAGNOSIS — I4891 Unspecified atrial fibrillation: Secondary | ICD-10-CM | POA: Diagnosis not present

## 2015-03-16 DIAGNOSIS — M71322 Other bursal cyst, left elbow: Secondary | ICD-10-CM | POA: Diagnosis not present

## 2015-03-16 LAB — CMP (CANCER CENTER ONLY)
ALK PHOS: 99 U/L — AB (ref 26–84)
ALT(SGPT): 14 U/L (ref 10–47)
AST: 32 U/L (ref 11–38)
Albumin: 3.5 g/dL (ref 3.3–5.5)
BUN: 24 mg/dL — AB (ref 7–22)
CALCIUM: 9.5 mg/dL (ref 8.0–10.3)
CHLORIDE: 106 meq/L (ref 98–108)
CO2: 28 mEq/L (ref 18–33)
Creat: 1.1 mg/dl (ref 0.6–1.2)
Glucose, Bld: 83 mg/dL (ref 73–118)
POTASSIUM: 4.1 meq/L (ref 3.3–4.7)
Sodium: 146 mEq/L — ABNORMAL HIGH (ref 128–145)
TOTAL PROTEIN: 6.5 g/dL (ref 6.4–8.1)
Total Bilirubin: 0.7 mg/dl (ref 0.20–1.60)

## 2015-03-16 LAB — CBC WITH DIFFERENTIAL (CANCER CENTER ONLY)
BASO#: 0 10*3/uL (ref 0.0–0.2)
BASO%: 1.1 % (ref 0.0–2.0)
EOS ABS: 0.3 10*3/uL (ref 0.0–0.5)
EOS%: 6.9 % (ref 0.0–7.0)
HCT: 41.2 % (ref 34.8–46.6)
HEMOGLOBIN: 13.3 g/dL (ref 11.6–15.9)
LYMPH#: 0.4 10*3/uL — ABNORMAL LOW (ref 0.9–3.3)
LYMPH%: 11.7 % — AB (ref 14.0–48.0)
MCH: 28.7 pg (ref 26.0–34.0)
MCHC: 32.3 g/dL (ref 32.0–36.0)
MCV: 89 fL (ref 81–101)
MONO#: 0.3 10*3/uL (ref 0.1–0.9)
MONO%: 8.9 % (ref 0.0–13.0)
NEUT#: 2.6 10*3/uL (ref 1.5–6.5)
NEUT%: 71.4 % (ref 39.6–80.0)
Platelets: 187 10*3/uL (ref 145–400)
RBC: 4.63 10*6/uL (ref 3.70–5.32)
RDW: 15.8 % — ABNORMAL HIGH (ref 11.1–15.7)
WBC: 3.6 10*3/uL — ABNORMAL LOW (ref 3.9–10.0)

## 2015-03-16 LAB — LACTATE DEHYDROGENASE: LDH: 159 U/L (ref 125–245)

## 2015-03-16 MED ORDER — DIPHENHYDRAMINE HCL 25 MG PO CAPS
50.0000 mg | ORAL_CAPSULE | Freq: Once | ORAL | Status: AC
  Administered 2015-03-16: 50 mg via ORAL

## 2015-03-16 MED ORDER — DIPHENHYDRAMINE HCL 25 MG PO CAPS
ORAL_CAPSULE | ORAL | Status: AC
  Filled 2015-03-16: qty 2

## 2015-03-16 MED ORDER — SODIUM CHLORIDE 0.9 % IV SOLN
Freq: Once | INTRAVENOUS | Status: AC
  Administered 2015-03-16: 10:00:00 via INTRAVENOUS

## 2015-03-16 MED ORDER — ACETAMINOPHEN 325 MG PO TABS
ORAL_TABLET | ORAL | Status: AC
  Filled 2015-03-16: qty 2

## 2015-03-16 MED ORDER — SODIUM CHLORIDE 0.9 % IV SOLN
375.0000 mg/m2 | Freq: Once | INTRAVENOUS | Status: AC
  Administered 2015-03-16: 600 mg via INTRAVENOUS
  Filled 2015-03-16: qty 50

## 2015-03-16 MED ORDER — ACETAMINOPHEN 325 MG PO TABS
650.0000 mg | ORAL_TABLET | Freq: Once | ORAL | Status: AC
  Administered 2015-03-16: 650 mg via ORAL

## 2015-03-16 NOTE — Patient Instructions (Signed)

## 2015-03-16 NOTE — Progress Notes (Addendum)
Hematology and Oncology Follow Up Visit  Kathy Massey [id number] November 26, 1927 82 y.o. 03/16/2015   Principle Diagnosis:   Marginal zone lymphoma  Current Therapy:    Status post cycle 6 of Rituxan/Treanda  Patient status post cycle 3 of maintenance Rituxan every 3 months     Interim History:  [name] is back for follow-up. She has tolerated the Rituxan well.  She is in well over the Christmas holiday. She had no problems at all. She ate well. She had no change in bowel or bladder habits. She's had no rashes. She's had no further skin lesions taken off.  There's been no cough or shortness of breath. She's had no mouth sores. She's had no headache.  She is on ELIQUIS. She's a well with ELIQUIS. She has atrial fibrillation. is not been any issues with bleeding.  Overall, her performance status is ECOG 2   Medications:  Current outpatient prescriptions:  .  apixaban (ELIQUIS) 2.5 MG TABS tablet, Take 2.5 mg by mouth 2 (two) times daily. , Disp: , Rfl:  .  clindamycin (CLEOCIN T) 1 % lotion, Apply topically., Disp: , Rfl:  .  mirtazapine (REMERON) 30 MG tablet, Take 0.5 tablets (15 mg total) by mouth at bedtime as needed., Disp: 5 tablet, Rfl: 0 .  metoprolol succinate (TOPROL XL) 25 MG 24 hr tablet, TAKE 1 TO 2 TABLETS TWICE A DAY, Disp: , Rfl:  .  omeprazole (PRILOSEC) 40 MG capsule, Take 40 mg by mouth daily. PRN, Disp: , Rfl:   Allergies: No Known Allergies  Past Medical History, Surgical history, Social history, and Family History were reviewed and updated.  Review of Systems: As above  Physical Exam:  height is 5\' 1"  (1.549 m) and weight is 115 lb (52.164 kg). Her oral temperature is 97.3 F (36.3 C). Her blood pressure is 106/66 and her pulse is 90. Her respiration is 16.   Elderly, petite white female in no obvious distress. Head and neck exam shows no ocular or oral lesions. There are no palpable cervical or supraclavicular lymph nodes. Lungs are clear. Cardiac  exam tachycardic and irregular, consistent with atrial fibrillation. There are no murmurs, rubs or bruits. Abdomen is soft. She has good bowel sounds. There is no fluid wave. There is no palpable abdominal mass. There is no palpable liver or spleen tip. Back exam shows no tenderness over the spine, ribs or hips. She has some slight kyphosis. Extremities shows no clubbing, cyanosis or edema. She has a dressing on the left lower leg. She does have a cyst on the left olecranon bursa. This is soft. It is fluctuant. It is non-tender. She has age related osteo-arthritic changes in her joints. She has decent strength in her extremities. Skin exam shows scattered areas that appear to be consistent with squamous cell or basal cell carcinomas. Neurological exam is nonfocal.  Lab Results  Component Value Date   WBC 3.6* 03/16/2015   HGB 13.3 03/16/2015   HCT 41.2 03/16/2015   MCV 89 03/16/2015   PLT 187 03/16/2015     Chemistry      Component Value Date/Time   NA 146* 03/16/2015 0842   K 4.1 03/16/2015 0842   CL 106 03/16/2015 0842   CO2 28 03/16/2015 0842   BUN 24* 03/16/2015 0842   CREATININE 1.1 03/16/2015 0842      Component Value Date/Time   CALCIUM 9.5 03/16/2015 0842   ALKPHOS 99* 03/16/2015 0842   AST 32 03/16/2015 [id number]  ALT 14 03/16/2015 0842   BILITOT 0.70 03/16/2015 0842         Impression and Plan: Kathy Massey is 82 year old white female. She has marginal zone lymphoma. She had abdominal disease that is fairly extensive.  She has had a very nice response to treatment. As such, I thought that only 6 cycles would be needed.  We will continue her on maintenance Rituxan. I think this will be a good idea for her. She has tolerated this well. This has not affected her atrial fibrillation   We will see if with the surgery can't see her for this left elbow cyst. I don't know has to be removed or just drained. It has been drained previously and seems to have recurred.  I told her  daughter that there is no indication that we have to do routine PET scans. I would only do a PET scan if she had changes in her physical exam or if her labs looked abnormal.  We will plan for follow-up in  3 months.  Cassell Smiles, MD 1/31/20179:26 AM

## 2015-03-17 LAB — VITAMIN D 25 HYDROXY (VIT D DEFICIENCY, FRACTURES): VIT D 25 HYDROXY: 34.6 ng/mL (ref 30.0–100.0)

## 2015-04-07 ENCOUNTER — Other Ambulatory Visit: Payer: Self-pay | Admitting: *Deleted

## 2015-04-07 DIAGNOSIS — C8583 Other specified types of non-Hodgkin lymphoma, intra-abdominal lymph nodes: Secondary | ICD-10-CM

## 2015-04-07 MED ORDER — CLINDAMYCIN PHOSPHATE 1 % EX LOTN: TOPICAL_LOTION | Freq: Two times a day (BID) | CUTANEOUS | Status: DC

## 2015-06-09 ENCOUNTER — Other Ambulatory Visit: Payer: Medicare Other

## 2015-06-09 ENCOUNTER — Ambulatory Visit: Payer: Medicare Other

## 2015-06-09 ENCOUNTER — Ambulatory Visit: Payer: Medicare Other | Admitting: Family

## 2015-06-11 ENCOUNTER — Ambulatory Visit: Payer: Medicare Other | Admitting: Family

## 2015-06-11 ENCOUNTER — Ambulatory Visit: Payer: Medicare Other

## 2015-06-11 ENCOUNTER — Other Ambulatory Visit: Payer: Medicare Other

## 2015-06-15 ENCOUNTER — Telehealth: Payer: Self-pay | Admitting: *Deleted

## 2015-06-15 NOTE — Telephone Encounter (Signed)
Patient daughter notifying office that patient has been admitted to [facility] for an MI and will not be at her scheduled appointment this Thursday. She would also like to know how to go about rescheduling the missed appointment.   Dr Marin Olp notified of events and states patient can reschedule for 6 weeks from now. [name] aware. Message sent to scheduler.

## 2015-06-17 ENCOUNTER — Other Ambulatory Visit: Payer: Medicare Other

## 2015-06-17 ENCOUNTER — Ambulatory Visit: Payer: Medicare Other | Admitting: Family

## 2015-06-17 ENCOUNTER — Ambulatory Visit: Payer: Medicare Other

## 2015-06-30 ENCOUNTER — Telehealth: Payer: Self-pay | Admitting: *Deleted

## 2015-06-30 NOTE — Telephone Encounter (Signed)
Patient's daughter notifying office that patient DID NOT have an MI like previously thought. She had a cardiac cath which showed open vessels. The patient is now at home. [name] wants to know whether or not they should still wait the 6 weeks to be seen in this office, or if she should come in sooner.   Spkoe to Dr Marin Olp and he wants patient to come in three weeks sooner than scheduled. [name], daughter, aware and message sent to scheduler to adjust appointments.

## 2015-07-14 ENCOUNTER — Ambulatory Visit (HOSPITAL_BASED_OUTPATIENT_CLINIC_OR_DEPARTMENT_OTHER): Payer: Medicare Other | Admitting: Family

## 2015-07-14 ENCOUNTER — Ambulatory Visit (HOSPITAL_BASED_OUTPATIENT_CLINIC_OR_DEPARTMENT_OTHER): Payer: Medicare Other

## 2015-07-14 ENCOUNTER — Encounter: Payer: Self-pay | Admitting: Family

## 2015-07-14 ENCOUNTER — Other Ambulatory Visit (HOSPITAL_BASED_OUTPATIENT_CLINIC_OR_DEPARTMENT_OTHER): Payer: Medicare Other

## 2015-07-14 VITALS — BP 106/63 | HR 72 | Temp 97.3°F | Resp 16 | Ht 61.0 in | Wt 115.0 lb

## 2015-07-14 DIAGNOSIS — C8583 Other specified types of non-Hodgkin lymphoma, intra-abdominal lymph nodes: Secondary | ICD-10-CM | POA: Diagnosis not present

## 2015-07-14 DIAGNOSIS — R5383 Other fatigue: Secondary | ICD-10-CM

## 2015-07-14 DIAGNOSIS — R0609 Other forms of dyspnea: Secondary | ICD-10-CM

## 2015-07-14 DIAGNOSIS — Z5112 Encounter for antineoplastic immunotherapy: Secondary | ICD-10-CM | POA: Diagnosis not present

## 2015-07-14 LAB — CMP (CANCER CENTER ONLY)
ALK PHOS: 102 U/L — AB (ref 26–84)
ALT: 25 U/L (ref 10–47)
AST: 44 U/L — AB (ref 11–38)
Albumin: 3.5 g/dL (ref 3.3–5.5)
BILIRUBIN TOTAL: 1 mg/dL (ref 0.20–1.60)
BUN, Bld: 17 mg/dL (ref 7–22)
CALCIUM: 9.2 mg/dL (ref 8.0–10.3)
CO2: 26 meq/L (ref 18–33)
CREATININE: 1.1 mg/dL (ref 0.6–1.2)
Chloride: 108 mEq/L (ref 98–108)
GLUCOSE: 93 mg/dL (ref 73–118)
Potassium: 4.2 mEq/L (ref 3.3–4.7)
SODIUM: 143 meq/L (ref 128–145)
Total Protein: 6.2 g/dL — ABNORMAL LOW (ref 6.4–8.1)

## 2015-07-14 LAB — CBC WITH DIFFERENTIAL (CANCER CENTER ONLY)
BASO#: 0.1 10*3/uL (ref 0.0–0.2)
BASO%: 1.4 % (ref 0.0–2.0)
EOS ABS: 0.2 10*3/uL (ref 0.0–0.5)
EOS%: 5.1 % (ref 0.0–7.0)
HCT: 40.8 % (ref 34.8–46.6)
HEMOGLOBIN: 13.6 g/dL (ref 11.6–15.9)
LYMPH#: 0.4 10*3/uL — ABNORMAL LOW (ref 0.9–3.3)
LYMPH%: 10 % — ABNORMAL LOW (ref 14.0–48.0)
MCH: 31.5 pg (ref 26.0–34.0)
MCHC: 33.3 g/dL (ref 32.0–36.0)
MCV: 94 fL (ref 81–101)
MONO#: 0.4 10*3/uL (ref 0.1–0.9)
MONO%: 8.2 % (ref 0.0–13.0)
NEUT#: 3.2 10*3/uL (ref 1.5–6.5)
NEUT%: 75.3 % (ref 39.6–80.0)
Platelets: 197 10*3/uL (ref 145–400)
RBC: 4.32 10*6/uL (ref 3.70–5.32)
RDW: 14.1 % (ref 11.1–15.7)
WBC: 4.3 10*3/uL (ref 3.9–10.0)

## 2015-07-14 LAB — LACTATE DEHYDROGENASE: LDH: 176 U/L (ref 125–245)

## 2015-07-14 LAB — CHCC SATELLITE - SMEAR

## 2015-07-14 MED ORDER — ACETAMINOPHEN 325 MG PO TABS
ORAL_TABLET | ORAL | Status: AC
  Filled 2015-07-14: qty 2

## 2015-07-14 MED ORDER — DIPHENHYDRAMINE HCL 25 MG PO CAPS
50.0000 mg | ORAL_CAPSULE | Freq: Once | ORAL | Status: AC
  Administered 2015-07-14: 50 mg via ORAL

## 2015-07-14 MED ORDER — DIPHENHYDRAMINE HCL 25 MG PO CAPS
ORAL_CAPSULE | ORAL | Status: AC
  Filled 2015-07-14: qty 2

## 2015-07-14 MED ORDER — SODIUM CHLORIDE 0.9 % IV SOLN
Freq: Once | INTRAVENOUS | Status: AC
  Administered 2015-07-14: 12:00:00 via INTRAVENOUS

## 2015-07-14 MED ORDER — RITUXIMAB CHEMO INJECTION 500 MG/50ML
375.0000 mg/m2 | Freq: Once | INTRAVENOUS | Status: AC
  Administered 2015-07-14: 600 mg via INTRAVENOUS
  Filled 2015-07-14: qty 50

## 2015-07-14 MED ORDER — ACETAMINOPHEN 325 MG PO TABS
650.0000 mg | ORAL_TABLET | Freq: Once | ORAL | Status: AC
  Administered 2015-07-14: 650 mg via ORAL

## 2015-07-14 NOTE — Patient Instructions (Signed)

## 2015-07-14 NOTE — Progress Notes (Signed)
Hematology and Oncology Follow Up Visit  Kathy Massey [id number] [birth year] 82 y.o. 07/14/2015   Principle Diagnosis:  Marginal zone lymphoma  Current Therapy:   Status post 6 cycles of Rituxan/Treanda Maintenance Rituxan every 3 months    Interim History: Kathy Massey is here today for a follow-up. She is feeling much better. She had been hospitalized at [facility] earlier this month for what was originally thought to be an MI. She ended up actually having Tokatsubo syndrome. She still has some fatigue and SOB with exertion. She is trying to stay active. She is walking 1 mile each day. She will also be starting cardiac rehab in the next week or so.  She follows up with cardiology on June 5th.  No fever, chills, n/v, cough, rash, headaches, dizziness, SOB, chest pain, palpitations, abdominal pain or changes in bowel or bladder habits. She is doing well on Eliquis and has had no episodes of bleeding. No bruising or petechiae.  No swelling, tenderness, numbness or tingling in her extremities. No c/o joint aches or bone pain.  Her appetite is good and she is staying hydrated. She supplements with Ensure as needed. Her weight is up 4 lbs since her last visit.    Medications:    Medication List       This list is accurate as of: 07/14/15 10:45 AM.  Always use your most recent med list.               clindamycin 1 % lotion  Commonly known as:  CLEOCIN-T  Apply topically 2 (two) times daily.     ELIQUIS 2.5 MG Tabs tablet  Generic drug:  apixaban  Take 2.5 mg by mouth 2 (two) times daily.     mirtazapine 30 MG tablet  Commonly known as:  REMERON  Take 0.5 tablets (15 mg total) by mouth at bedtime as needed.     PRILOSEC 40 MG capsule  Generic drug:  omeprazole  Take 40 mg by mouth daily. PRN     TOPROL XL 25 MG 24 hr tablet  Generic drug:  metoprolol succinate  TAKE 1 TO 2 TABLETS TWICE A DAY        Allergies: No Known Allergies  Past Medical History, Surgical  history, Social history, and Family History were reviewed and updated.  Review of Systems: All other 10 point review of systems is negative.   Physical Exam:  vitals were not taken for this visit.  Wt Readings from Last 3 Encounters:  03/16/15 115 lb (52.164 kg)  12/15/14 110 lb (49.896 kg)  09/09/14 114 lb (51.71 kg)    Ocular: Sclerae unicteric, pupils equal, round and reactive to light Ear-nose-throat: Oropharynx clear, dentition fair Lymphatic: No cervical supraclavicular or axillary adenopathy Lungs no rales or rhonchi, good excursion bilaterally Heart controlled atrial fib Abd soft, nontender, positive bowel sounds, no liver or spleen tip palpated on exam, no fluid wave MSK no focal spinal tenderness, age related kyphosis, no joint edema Neuro: non-focal, well-oriented, appropriate affect Breasts: Deferred  Lab Results  Component Value Date   WBC 3.6* 03/16/2015   HGB 13.3 03/16/2015   HCT 41.2 03/16/2015   MCV 89 03/16/2015   PLT 187 03/16/2015   No results found for: FERRITIN, IRON, TIBC, UIBC, IRONPCTSAT Lab Results  Component Value Date   RBC 4.63 03/16/2015   No results found for: KPAFRELGTCHN, LAMBDASER, KAPLAMBRATIO No results found for: IGGSERUM, IGA, IGMSERUM No results found for: TOTALPROTELP, ALBUMINELP, A1GS, A2GS, BETS, BETA2SER, [place], MSPIKE, SPEI  Chemistry      Component Value Date/Time   NA 146* 03/16/2015 0842   K 4.1 03/16/2015 0842   CL 106 03/16/2015 0842   CO2 28 03/16/2015 0842   BUN 24* 03/16/2015 0842   CREATININE 1.1 03/16/2015 0842      Component Value Date/Time   CALCIUM 9.5 03/16/2015 0842   ALKPHOS 99* 03/16/2015 0842   AST 32 03/16/2015 0842   ALT 14 03/16/2015 0842   BILITOT 0.70 03/16/2015 0842     Impression and Plan: Ms. Kay is 82 year old white female with marginal zone lymphoma. So far, she has done well receiving maintenance Rituxan. She has mild fatigue and SOB with exertion at times.  She has recuperated  nicely from her Tokatsubo syndrome earlier this month. She will follow-up with cardiology next week and start cardiac rehab in the next week or so.  Her exam today was negative. CBC and LDH look good today as well. No scan needed at this time.  We will plan to see her back in 3 months for repeat lab work, follow-up and treatment.  She will contact us with any questions or concerns. We can certainly see her sooner if needed.   Eliezer Bottom, NP 5/31/201710:45 AM

## 2015-07-29 ENCOUNTER — Ambulatory Visit: Payer: Medicare Other

## 2015-07-29 ENCOUNTER — Other Ambulatory Visit: Payer: Medicare Other

## 2015-07-29 ENCOUNTER — Ambulatory Visit: Payer: Medicare Other | Admitting: Family

## 2015-08-16 ENCOUNTER — Other Ambulatory Visit: Payer: Self-pay | Admitting: Nurse Practitioner

## 2015-09-24 ENCOUNTER — Ambulatory Visit (HOSPITAL_BASED_OUTPATIENT_CLINIC_OR_DEPARTMENT_OTHER): Payer: Medicare Other | Admitting: Family

## 2015-09-24 ENCOUNTER — Other Ambulatory Visit (HOSPITAL_BASED_OUTPATIENT_CLINIC_OR_DEPARTMENT_OTHER): Payer: Medicare Other

## 2015-09-24 ENCOUNTER — Ambulatory Visit (HOSPITAL_BASED_OUTPATIENT_CLINIC_OR_DEPARTMENT_OTHER): Payer: Medicare Other

## 2015-09-24 ENCOUNTER — Encounter: Payer: Self-pay | Admitting: Family

## 2015-09-24 VITALS — BP 124/60 | HR 69 | Temp 97.2°F | Resp 20

## 2015-09-24 VITALS — BP 105/63 | HR 78 | Temp 97.2°F | Resp 16 | Ht 61.0 in | Wt 111.0 lb

## 2015-09-24 DIAGNOSIS — Z5112 Encounter for antineoplastic immunotherapy: Secondary | ICD-10-CM | POA: Diagnosis not present

## 2015-09-24 DIAGNOSIS — C8583 Other specified types of non-Hodgkin lymphoma, intra-abdominal lymph nodes: Secondary | ICD-10-CM

## 2015-09-24 LAB — CBC WITH DIFFERENTIAL (CANCER CENTER ONLY)
BASO#: 0.1 10*3/uL (ref 0.0–0.2)
BASO%: 1.5 % (ref 0.0–2.0)
EOS ABS: 0.2 10*3/uL (ref 0.0–0.5)
EOS%: 5.3 % (ref 0.0–7.0)
HEMATOCRIT: 44.7 % (ref 34.8–46.6)
HEMOGLOBIN: 14.8 g/dL (ref 11.6–15.9)
LYMPH#: 0.5 10*3/uL — AB (ref 0.9–3.3)
LYMPH%: 12.8 % — ABNORMAL LOW (ref 14.0–48.0)
MCH: 31 pg (ref 26.0–34.0)
MCHC: 33.1 g/dL (ref 32.0–36.0)
MCV: 94 fL (ref 81–101)
MONO#: 0.3 10*3/uL (ref 0.1–0.9)
MONO%: 7 % (ref 0.0–13.0)
NEUT%: 73.4 % (ref 39.6–80.0)
NEUTROS ABS: 2.9 10*3/uL (ref 1.5–6.5)
PLATELETS: 234 10*3/uL (ref 145–400)
RBC: 4.77 10*6/uL (ref 3.70–5.32)
RDW: 13.4 % (ref 11.1–15.7)
WBC: 4 10*3/uL (ref 3.9–10.0)

## 2015-09-24 LAB — CMP (CANCER CENTER ONLY)
ALBUMIN: 3.9 g/dL (ref 3.3–5.5)
ALK PHOS: 105 U/L — AB (ref 26–84)
ALT: 12 U/L (ref 10–47)
AST: 35 U/L (ref 11–38)
BILIRUBIN TOTAL: 1.2 mg/dL (ref 0.20–1.60)
BUN, Bld: 16 mg/dL (ref 7–22)
CO2: 29 meq/L (ref 18–33)
CREATININE: 1.1 mg/dL (ref 0.6–1.2)
Calcium: 9.8 mg/dL (ref 8.0–10.3)
Chloride: 109 mEq/L — ABNORMAL HIGH (ref 98–108)
Glucose, Bld: 95 mg/dL (ref 73–118)
Potassium: 4.1 mEq/L (ref 3.3–4.7)
Sodium: 139 mEq/L (ref 128–145)
TOTAL PROTEIN: 6.9 g/dL (ref 6.4–8.1)

## 2015-09-24 LAB — LACTATE DEHYDROGENASE: LDH: 176 U/L (ref 125–245)

## 2015-09-24 LAB — CHCC SATELLITE - SMEAR

## 2015-09-24 MED ORDER — ACETAMINOPHEN 325 MG PO TABS
650.0000 mg | ORAL_TABLET | Freq: Once | ORAL | Status: AC
  Administered 2015-09-24: 650 mg via ORAL

## 2015-09-24 MED ORDER — DIPHENHYDRAMINE HCL 25 MG PO CAPS
ORAL_CAPSULE | ORAL | Status: AC
  Filled 2015-09-24: qty 2

## 2015-09-24 MED ORDER — SODIUM CHLORIDE 0.9 % IV SOLN
375.0000 mg/m2 | Freq: Once | INTRAVENOUS | Status: AC
  Administered 2015-09-24: 600 mg via INTRAVENOUS
  Filled 2015-09-24: qty 50

## 2015-09-24 MED ORDER — SODIUM CHLORIDE 0.9 % IV SOLN
Freq: Once | INTRAVENOUS | Status: AC
  Administered 2015-09-24: 10:00:00 via INTRAVENOUS

## 2015-09-24 MED ORDER — DIPHENHYDRAMINE HCL 25 MG PO CAPS
50.0000 mg | ORAL_CAPSULE | Freq: Once | ORAL | Status: AC
  Administered 2015-09-24: 50 mg via ORAL

## 2015-09-24 MED ORDER — ACETAMINOPHEN 325 MG PO TABS
ORAL_TABLET | ORAL | Status: AC
  Filled 2015-09-24: qty 2

## 2015-09-24 NOTE — Patient Instructions (Signed)
Rituximab injection What is this medicine? RITUXIMAB (ri TUX i mab) is a monoclonal antibody. It is used commonly to treat non-Hodgkin lymphoma and other conditions. It is also used to treat rheumatoid arthritis (RA). In RA, this medicine slows the inflammatory process and help reduce joint pain and swelling. This medicine is often used with other cancer or arthritis medications. This medicine may be used for other purposes; ask your health care provider or pharmacist if you have questions. What should I tell my health care provider before I take this medicine? They need to know if you have any of these conditions: -blood disorders -heart disease -history of hepatitis B -infection (especially a virus infection such as chickenpox, cold sores, or herpes) -irregular heartbeat -kidney disease -lung or breathing disease, like asthma -lupus -an unusual or allergic reaction to rituximab, mouse proteins, other medicines, foods, dyes, or preservatives -pregnant or trying to get pregnant -breast-feeding How should I use this medicine? This medicine is for infusion into a vein. It is administered in a hospital or clinic by a specially trained health care professional. A special MedGuide will be given to you by the pharmacist with each prescription and refill. Be sure to read this information carefully each time. Talk to your pediatrician regarding the use of this medicine in children. This medicine is not approved for use in children. Overdosage: If you think you have taken too much of this medicine contact a poison control center or emergency room at once. NOTE: This medicine is only for you. Do not share this medicine with others. What if I miss a dose? It is important not to miss a dose. Call your doctor or health care professional if you are unable to keep an appointment. What may interact with this medicine? -cisplatin -medicines for blood pressure -some other medicines for  arthritis -vaccines This list may not describe all possible interactions. Give your health care provider a list of all the medicines, herbs, non-prescription drugs, or dietary supplements you use. Also tell them if you smoke, drink alcohol, or use illegal drugs. Some items may interact with your medicine. What should I watch for while using this medicine? Report any side effects that you notice during your treatment right away, such as changes in your breathing, fever, chills, dizziness or lightheadedness. These effects are more common with the first dose. Visit your prescriber or health care professional for checks on your progress. You will need to have regular blood work. Report any other side effects. The side effects of this medicine can continue after you finish your treatment. Continue your course of treatment even though you feel ill unless your doctor tells you to stop. Call your doctor or health care professional for advice if you get a fever, chills or sore throat, or other symptoms of a cold or flu. Do not treat yourself. This drug decreases your body's ability to fight infections. Try to avoid being around people who are sick. This medicine may increase your risk to bruise or bleed. Call your doctor or health care professional if you notice any unusual bleeding. Be careful brushing and flossing your teeth or using a toothpick because you may get an infection or bleed more easily. If you have any dental work done, tell your dentist you are receiving this medicine. Avoid taking products that contain aspirin, acetaminophen, ibuprofen, naproxen, or ketoprofen unless instructed by your doctor. These medicines may hide a fever. Do not become pregnant while taking this medicine. Women should inform their doctor if   they wish to become pregnant or think they might be pregnant. There is a potential for serious side effects to an unborn child. Talk to your health care professional or pharmacist for more  information. Do not breast-feed an infant while taking this medicine. What side effects may I notice from receiving this medicine? Side effects that you should report to your doctor or health care professional as soon as possible: -allergic reactions like skin rash, itching or hives, swelling of the face, lips, or tongue -low blood counts - this medicine may decrease the number of white blood cells, red blood cells and platelets. You may be at increased risk for infections and bleeding. -signs of infection - fever or chills, cough, sore throat, pain or difficulty passing urine -signs of decreased platelets or bleeding - bruising, pinpoint red spots on the skin, black, tarry stools, blood in the urine -signs of decreased red blood cells - unusually weak or tired, fainting spells, lightheadedness -breathing problems -confused, not responsive -chest pain -fast, irregular heartbeat -feeling faint or lightheaded, falls -mouth sores -redness, blistering, peeling or loosening of the skin, including inside the mouth -stomach pain -swelling of the ankles, feet, or hands -trouble passing urine or change in the amount of urine Side effects that usually do not require medical attention (report to your doctor or other health care professional if they continue or are bothersome): -anxiety -headache -loss of appetite -muscle aches -nausea -night sweats This list may not describe all possible side effects. Call your doctor for medical advice about side effects. You may report side effects to FDA at 1-800-FDA-1088. Where should I keep my medicine? This drug is given in a hospital or clinic and will not be stored at home. NOTE: This sheet is a summary. It may not cover all possible information. If you have questions about this medicine, talk to your doctor, pharmacist, or health care provider.    2016, Elsevier/Gold Standard. (2014-04-08 22:30:56)  

## 2015-09-24 NOTE — Progress Notes (Signed)
Hematology and Oncology Follow Up Visit  Kathy Massey [id number] May 31, 1927 82 y.o. 09/24/2015   Principle Diagnosis:  Marginal zone lymphoma  Current Therapy:   Status post 6 cycles of Rituxan/Treanda Maintenance Rituxan every 3 months    Interim History: Kathy Massey is here today for a follow-up. She is doing fairly well. She has had some issues with bradycardia and tachycardia and is being followed closely by cardiology. She has had some adjustments made to her medications and is trying to find the right regimen. She is currently in cardiac rehab and enjoying the activities. She is also walking her dog in the evening.  She has occasional SOB with exertion and will take a moment to rest until this resolves.  She has also had some dizziness with her BP fluctuating but thankfully has had no syncopal episodes or falls.  No fever, chills, n/v, cough, rash, headaches, chest pain, abdominal pain or changes in bowel or bladder habits. 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 The incision site has healed nicely. No s/s of infection. She also has a spot on her head that her dermatologist plans to remove soon.  She is doing well on Eliquis and has had no episodes of bleeding. No bruising or petechiae.  No swelling, tenderness, numbness or tingling in her extremities. No c/o joint aches or bone pain.  She has maintained a good appetite and is staying well hydrated. She still supplements with Ensure as needed. Her weight is unchanged.   Medications:    Medication List       Accurate as of 09/24/15  8:54 AM. Always use your most recent med list.          clindamycin 1 % lotion Commonly known as:  CLEOCIN-T Apply topically 2 (two) times daily.   ELIQUIS 2.5 MG Tabs tablet Generic drug:  apixaban Take 2.5 mg by mouth 2 (two) times daily.   lisinopril 5 MG tablet Commonly known as:  PRINIVIL,ZESTRIL Take 5 mg by mouth.   mirtazapine 30 MG tablet Commonly  known as:  REMERON Take 0.5 tablets (15 mg total) by mouth at bedtime as needed.   PRILOSEC 40 MG capsule Generic drug:  omeprazole Take 40 mg by mouth daily. PRN   TOPROL XL 25 MG 24 hr tablet Generic drug:  metoprolol succinate TAKE 1 TO 2 TABLETS TWICE A DAY       Allergies: No Known Allergies  Past Medical History, Surgical history, Social history, and Family History were reviewed and updated.  Review of Systems: All other 10 point review of systems is negative.   Physical Exam:  vitals were not taken for this visit.  Wt Readings from Last 3 Encounters:  07/14/15 115 lb (52.2 kg)  03/16/15 115 lb (52.2 kg)  12/15/14 110 lb (49.9 kg)    Ocular: Sclerae unicteric, pupils equal, round and reactive to light Ear-nose-throat: Oropharynx clear, dentition fair Lymphatic: No cervical supraclavicular or axillary adenopathy Lungs no rales or rhonchi, good excursion bilaterally Heart controlled atrial fib Abd soft, nontender, positive bowel sounds, no liver or spleen tip palpated on exam, no fluid wave MSK no focal spinal tenderness, age related kyphosis, no joint edema Neuro: non-focal, well-oriented, appropriate affect Breasts: Deferred  Lab Results  Component Value Date   WBC 4.3 07/14/2015   HGB 13.6 07/14/2015   HCT 40.8 07/14/2015   MCV 94 07/14/2015   PLT 197 07/14/2015   No results found for: FERRITIN, IRON, TIBC, UIBC, IRONPCTSAT Lab Results  Component Value Date   RBC 4.32 07/14/2015   No results found for: KPAFRELGTCHN, LAMBDASER, KAPLAMBRATIO No results found for: IGGSERUM, IGA, IGMSERUM No results found for: Kathy Massey, SPEI   Chemistry      Component Value Date/Time   NA 143 07/14/2015 1036   K 4.2 07/14/2015 1036   CL 108 07/14/2015 1036   CO2 26 07/14/2015 1036   BUN 17 07/14/2015 1036   CREATININE 1.1 07/14/2015 1036      Component Value Date/Time   CALCIUM 9.2 07/14/2015 1036    ALKPHOS 102 (H) 07/14/2015 1036   AST 44 (H) 07/14/2015 1036   ALT 25 07/14/2015 1036   BILITOT 1.00 07/14/2015 1036     Impression and Plan: Kathy Massey is 82 year old white female with marginal zone lymphoma. So far, she has done well receiving maintenance Rituxan. He main issue at this time appears to be heart related and she is working with her cardiologist to find the correct medication regimen for her. She is enjoying cardiac rehab and has no complaints at this time.  We will proceed with Rituxan treatment at this time per Dr. Marin Olp.  We will plan to see her back in 3 months for repeat lab work, follow-up and treatment.  Both she and her daughter know to contact our office with any questions or concerns. We can certainly see her sooner if needed.   Eliezer Bottom, NP 8/11/20178:54 AM

## 2015-09-29 ENCOUNTER — Other Ambulatory Visit: Payer: Medicare Other

## 2015-09-29 ENCOUNTER — Ambulatory Visit: Payer: Medicare Other

## 2015-09-29 ENCOUNTER — Ambulatory Visit: Payer: Medicare Other | Admitting: Family

## 2015-09-30 ENCOUNTER — Other Ambulatory Visit: Payer: Medicare Other

## 2015-09-30 ENCOUNTER — Ambulatory Visit: Payer: Medicare Other | Admitting: Family

## 2015-09-30 ENCOUNTER — Ambulatory Visit: Payer: Medicare Other

## 2015-10-08 ENCOUNTER — Ambulatory Visit: Payer: Medicare Other | Admitting: Family

## 2015-10-08 ENCOUNTER — Other Ambulatory Visit: Payer: Medicare Other

## 2015-10-08 ENCOUNTER — Ambulatory Visit: Payer: Medicare Other

## 2015-12-24 ENCOUNTER — Ambulatory Visit (HOSPITAL_BASED_OUTPATIENT_CLINIC_OR_DEPARTMENT_OTHER): Payer: Medicare Other

## 2015-12-24 ENCOUNTER — Other Ambulatory Visit (HOSPITAL_BASED_OUTPATIENT_CLINIC_OR_DEPARTMENT_OTHER): Payer: Medicare Other

## 2015-12-24 ENCOUNTER — Ambulatory Visit (HOSPITAL_BASED_OUTPATIENT_CLINIC_OR_DEPARTMENT_OTHER): Payer: Medicare Other | Admitting: Family

## 2015-12-24 ENCOUNTER — Encounter: Payer: Self-pay | Admitting: Family

## 2015-12-24 VITALS — BP 129/78 | HR 72 | Temp 98.0°F | Wt 112.8 lb

## 2015-12-24 DIAGNOSIS — C8583 Other specified types of non-Hodgkin lymphoma, intra-abdominal lymph nodes: Secondary | ICD-10-CM

## 2015-12-24 DIAGNOSIS — Z5112 Encounter for antineoplastic immunotherapy: Secondary | ICD-10-CM

## 2015-12-24 LAB — CMP (CANCER CENTER ONLY)
ALK PHOS: 108 U/L — AB (ref 26–84)
ALT: 26 U/L (ref 10–47)
AST: 47 U/L — ABNORMAL HIGH (ref 11–38)
Albumin: 3.9 g/dL (ref 3.3–5.5)
BUN: 17 mg/dL (ref 7–22)
CALCIUM: 9.4 mg/dL (ref 8.0–10.3)
CO2: 28 meq/L (ref 18–33)
Chloride: 106 mEq/L (ref 98–108)
Creat: 1.2 mg/dl (ref 0.6–1.2)
GLUCOSE: 90 mg/dL (ref 73–118)
POTASSIUM: 4.4 meq/L (ref 3.3–4.7)
Sodium: 145 mEq/L (ref 128–145)
Total Bilirubin: 1 mg/dl (ref 0.20–1.60)
Total Protein: 6.7 g/dL (ref 6.4–8.1)

## 2015-12-24 LAB — CBC WITH DIFFERENTIAL (CANCER CENTER ONLY)
BASO#: 0.1 10*3/uL (ref 0.0–0.2)
BASO%: 1.4 % (ref 0.0–2.0)
EOS%: 5.4 % (ref 0.0–7.0)
Eosinophils Absolute: 0.2 10*3/uL (ref 0.0–0.5)
HCT: 41.8 % (ref 34.8–46.6)
HGB: 13.8 g/dL (ref 11.6–15.9)
LYMPH#: 0.5 10*3/uL — ABNORMAL LOW (ref 0.9–3.3)
LYMPH%: 14.7 % (ref 14.0–48.0)
MCH: 31.3 pg (ref 26.0–34.0)
MCHC: 33 g/dL (ref 32.0–36.0)
MCV: 95 fL (ref 81–101)
MONO#: 0.3 10*3/uL (ref 0.1–0.9)
MONO%: 6.8 % (ref 0.0–13.0)
NEUT#: 2.6 10*3/uL (ref 1.5–6.5)
NEUT%: 71.7 % (ref 39.6–80.0)
PLATELETS: 221 10*3/uL (ref 145–400)
RBC: 4.41 10*6/uL (ref 3.70–5.32)
RDW: 13.7 % (ref 11.1–15.7)
WBC: 3.7 10*3/uL — ABNORMAL LOW (ref 3.9–10.0)

## 2015-12-24 LAB — LACTATE DEHYDROGENASE: LDH: 186 U/L (ref 125–245)

## 2015-12-24 LAB — CHCC SATELLITE - SMEAR

## 2015-12-24 MED ORDER — DIPHENHYDRAMINE HCL 25 MG PO CAPS
50.0000 mg | ORAL_CAPSULE | Freq: Once | ORAL | Status: AC
  Administered 2015-12-24: 25 mg via ORAL

## 2015-12-24 MED ORDER — SODIUM CHLORIDE 0.9 % IV SOLN
Freq: Once | INTRAVENOUS | Status: AC
  Administered 2015-12-24: 10:00:00 via INTRAVENOUS

## 2015-12-24 MED ORDER — ACETAMINOPHEN 325 MG PO TABS
650.0000 mg | ORAL_TABLET | Freq: Once | ORAL | Status: AC
  Administered 2015-12-24: 650 mg via ORAL

## 2015-12-24 MED ORDER — DIPHENHYDRAMINE HCL 25 MG PO CAPS
ORAL_CAPSULE | ORAL | Status: AC
  Filled 2015-12-24: qty 1

## 2015-12-24 MED ORDER — ACETAMINOPHEN 325 MG PO TABS
ORAL_TABLET | ORAL | Status: AC
  Filled 2015-12-24: qty 2

## 2015-12-24 MED ORDER — SODIUM CHLORIDE 0.9 % IV SOLN
375.0000 mg/m2 | Freq: Once | INTRAVENOUS | Status: AC
  Administered 2015-12-24: 600 mg via INTRAVENOUS
  Filled 2015-12-24: qty 10

## 2015-12-24 NOTE — Progress Notes (Signed)
Hematology and Oncology Follow Up Visit  Kathy Massey [id number] July 26, 1927 82 y.o. 12/24/2015   Principle Diagnosis:  Marginal zone lymphoma  Current Therapy:   Status post 6 cycles of Rituxan/Treanda Maintenance Rituxan every 3 months    Interim History: Kathy Massey is here today for a follow-up. She is doing well but having some fatigue with exertion. She has finished cardiac rehab and would like to get back to the fitness center with her silver sneakers group. She is still walking her little dog several times a day.  She is contemplating moving closer to her children and has been looking retirement communities in [place]. She has found several that she likes but is not quite ready to make the commitment.  She still has occasional SOB with exertion and will take a moment to rest until this resolves.  No issue with infections. No fever, chills, n/v, cough, rash, headaches, chest pain, palpitations, abdominal pain or changes in bowel or bladder habits. She still has occasional dizziness if she gets up too quickly. This resolves on its own. She is followed closely by cardiology and takes her Toprol XL once daily. She has had 3 Mohs procedures done, one to the back one the left leg and one to the top of her head. She states that these were all squamous cell carcinoma. No melanoma.  She is doing well on Eliquis and has had no episodes of bleeding. No bruising or petechiae.  No swelling, tenderness, numbness or tingling in her extremities. No c/o joint aches or bone pain.  She is ambulating well and has had no falls or syncope.  She has maintained a good appetite and is staying well hydrated. She still supplements with Ensure as needed. Her weight is unchanged.   Medications:    Medication List       Accurate as of 12/24/15 10:54 AM. Always use your most recent med list.          clindamycin 1 % lotion Commonly known as:  CLEOCIN-T Apply topically 2 (two) times daily.     dronedarone 400 MG tablet Commonly known as:  MULTAQ Take 400 mg by mouth 2 (two) times daily.   ELIQUIS 2.5 MG Tabs tablet Generic drug:  apixaban Take 2.5 mg by mouth 2 (two) times daily.   mirtazapine 30 MG tablet Commonly known as:  REMERON Take 0.5 tablets (15 mg total) by mouth at bedtime as needed.   PRILOSEC 40 MG capsule Generic drug:  omeprazole Take 40 mg by mouth daily. PRN   THERA Tabs Take 1 tablet by mouth every morning.   TOPROL XL 25 MG 24 hr tablet Generic drug:  metoprolol succinate TAKE 1 TO 2 TABLETS TWICE A DAY       Allergies: No Known Allergies  Past Medical History, Surgical history, Social history, and Family History were reviewed and updated.  Review of Systems: All other 10 point review of systems is negative.   Physical Exam:  weight is 112 lb 12.8 oz (51.2 kg). Her oral temperature is 98 F (36.7 C). Her blood pressure is 129/78 and her pulse is 72.   Wt Readings from Last 3 Encounters:  12/24/15 112 lb 12.8 oz (51.2 kg)  09/24/15 111 lb (50.3 kg)  07/14/15 115 lb (52.2 kg)    Ocular: Sclerae unicteric, pupils equal, round and reactive to light Ear-nose-throat: Oropharynx clear, dentition fair Lymphatic: No cervical supraclavicular or axillary adenopathy Lungs no rales or rhonchi, good excursion bilaterally Heart controlled atrial fib Abd  soft, nontender, positive bowel sounds, no liver or spleen tip palpated on exam, no fluid wave MSK no focal spinal tenderness, age related kyphosis, no joint edema Neuro: non-focal, well-oriented, appropriate affect Breasts: Deferred  Lab Results  Component Value Date   WBC 3.7 (L) 12/24/2015   HGB 13.8 12/24/2015   HCT 41.8 12/24/2015   MCV 95 12/24/2015   PLT 221 12/24/2015   No results found for: FERRITIN, IRON, TIBC, UIBC, IRONPCTSAT Lab Results  Component Value Date   RBC 4.41 12/24/2015   No results found for: KPAFRELGTCHN, LAMBDASER, KAPLAMBRATIO No results found for: IGGSERUM,  IGA, IGMSERUM No results found for: Odetta Pink, SPEI   Chemistry      Component Value Date/Time   NA 145 12/24/2015 0837   K 4.4 12/24/2015 0837   CL 106 12/24/2015 0837   CO2 28 12/24/2015 0837   BUN 17 12/24/2015 0837   CREATININE 1.2 12/24/2015 0837      Component Value Date/Time   CALCIUM 9.4 12/24/2015 0837   ALKPHOS 108 (H) 12/24/2015 0837   AST 47 (H) 12/24/2015 0837   ALT 26 12/24/2015 0837   BILITOT 1.00 12/24/2015 0837     Impression and Plan: Kathy Massey is 82 yo white female with marginal zone lymphoma. So far, she has done well on maintenance Rituxan. She has intermittent fatigue and dizziness but these are mild and tolerable at this time. She continues to see cardiology regularly.  CBC and CMP are stable.  We will proceed with Rituxan today as planned per Dr. Marin Olp.  We will plan to see her back in 3 months for repeat lab work, follow-up and treatment.  Both she and her daughter know to contact our office with any questions or concerns. We can certainly see her sooner if needed.   Eliezer Bottom, NP 11/10/201710:54 AM

## 2015-12-24 NOTE — Patient Instructions (Signed)
[facility] Discharge Instructions for Patients Receiving Chemotherapy  Today you received the following chemotherapy agents Rituxan.  To help prevent nausea and vomiting after your treatment, we encourage you to take your nausea medication as prescribesd.   If you develop nausea and vomiting that is not controlled by your nausea medication, call the clinic.   BELOW ARE SYMPTOMS THAT SHOULD BE REPORTED IMMEDIATELY:  *FEVER GREATER THAN 100.5 F  *CHILLS WITH OR WITHOUT FEVER  NAUSEA AND VOMITING THAT IS NOT CONTROLLED WITH YOUR NAUSEA MEDICATION  *UNUSUAL SHORTNESS OF BREATH  *UNUSUAL BRUISING OR BLEEDING  TENDERNESS IN MOUTH AND THROAT WITH OR WITHOUT PRESENCE OF ULCERS  *URINARY PROBLEMS  *BOWEL PROBLEMS  UNUSUAL RASH Items with * indicate a potential emergency and should be followed up as soon as possible.  Feel free to call the clinic you have any questions or concerns. The clinic phone number is [phone].  Please show the [place] at check-in to the Emergency Department and triage nurse.

## 2016-03-22 ENCOUNTER — Ambulatory Visit (HOSPITAL_BASED_OUTPATIENT_CLINIC_OR_DEPARTMENT_OTHER): Payer: Medicare Other

## 2016-03-22 ENCOUNTER — Other Ambulatory Visit (HOSPITAL_BASED_OUTPATIENT_CLINIC_OR_DEPARTMENT_OTHER): Payer: Medicare Other

## 2016-03-22 ENCOUNTER — Ambulatory Visit (HOSPITAL_BASED_OUTPATIENT_CLINIC_OR_DEPARTMENT_OTHER): Payer: Medicare Other | Admitting: Hematology & Oncology

## 2016-03-22 VITALS — BP 127/67 | HR 68 | Temp 97.4°F | Resp 20 | Wt 113.0 lb

## 2016-03-22 VITALS — BP 111/72 | HR 69 | Temp 98.1°F | Resp 17

## 2016-03-22 DIAGNOSIS — I4891 Unspecified atrial fibrillation: Secondary | ICD-10-CM | POA: Diagnosis not present

## 2016-03-22 DIAGNOSIS — C8583 Other specified types of non-Hodgkin lymphoma, intra-abdominal lymph nodes: Secondary | ICD-10-CM

## 2016-03-22 DIAGNOSIS — Z5112 Encounter for antineoplastic immunotherapy: Secondary | ICD-10-CM | POA: Diagnosis not present

## 2016-03-22 LAB — CMP (CANCER CENTER ONLY)
ALT(SGPT): 29 U/L (ref 10–47)
AST: 48 U/L — ABNORMAL HIGH (ref 11–38)
Albumin: 3.7 g/dL (ref 3.3–5.5)
Alkaline Phosphatase: 141 U/L — ABNORMAL HIGH (ref 26–84)
BUN: 18 mg/dL (ref 7–22)
CHLORIDE: 105 meq/L (ref 98–108)
CO2: 28 meq/L (ref 18–33)
CREATININE: 1.2 mg/dL (ref 0.6–1.2)
Calcium: 9.6 mg/dL (ref 8.0–10.3)
GLUCOSE: 97 mg/dL (ref 73–118)
POTASSIUM: 4 meq/L (ref 3.3–4.7)
SODIUM: 145 meq/L (ref 128–145)
Total Bilirubin: 1 mg/dl (ref 0.20–1.60)
Total Protein: 6.5 g/dL (ref 6.4–8.1)

## 2016-03-22 LAB — CBC WITH DIFFERENTIAL (CANCER CENTER ONLY)
BASO#: 0 10*3/uL (ref 0.0–0.2)
BASO%: 0.8 % (ref 0.0–2.0)
EOS ABS: 0.2 10*3/uL (ref 0.0–0.5)
EOS%: 6.6 % (ref 0.0–7.0)
HCT: 42.2 % (ref 34.8–46.6)
HGB: 13.9 g/dL (ref 11.6–15.9)
LYMPH#: 0.5 10*3/uL — ABNORMAL LOW (ref 0.9–3.3)
LYMPH%: 13.4 % — AB (ref 14.0–48.0)
MCH: 30.8 pg (ref 26.0–34.0)
MCHC: 32.9 g/dL (ref 32.0–36.0)
MCV: 94 fL (ref 81–101)
MONO#: 0.2 10*3/uL (ref 0.1–0.9)
MONO%: 6.3 % (ref 0.0–13.0)
NEUT#: 2.7 10*3/uL (ref 1.5–6.5)
NEUT%: 72.9 % (ref 39.6–80.0)
PLATELETS: 189 10*3/uL (ref 145–400)
RBC: 4.51 10*6/uL (ref 3.70–5.32)
RDW: 14 % (ref 11.1–15.7)
WBC: 3.7 10*3/uL — AB (ref 3.9–10.0)

## 2016-03-22 LAB — LACTATE DEHYDROGENASE: LDH: 168 U/L (ref 125–245)

## 2016-03-22 LAB — CHCC SATELLITE - SMEAR

## 2016-03-22 MED ORDER — SODIUM CHLORIDE 0.9 % IV SOLN
375.0000 mg/m2 | Freq: Once | INTRAVENOUS | Status: AC
  Administered 2016-03-22: 600 mg via INTRAVENOUS
  Filled 2016-03-22: qty 50

## 2016-03-22 MED ORDER — SODIUM CHLORIDE 0.9 % IV SOLN
Freq: Once | INTRAVENOUS | Status: AC
Start: 2016-03-22 — End: 2016-03-22
  Administered 2016-03-22: 10:00:00 via INTRAVENOUS

## 2016-03-22 MED ORDER — DIPHENHYDRAMINE HCL 25 MG PO CAPS
ORAL_CAPSULE | ORAL | Status: AC
  Filled 2016-03-22: qty 2

## 2016-03-22 MED ORDER — ACETAMINOPHEN 325 MG PO TABS
ORAL_TABLET | ORAL | Status: AC
  Filled 2016-03-22: qty 2

## 2016-03-22 MED ORDER — DIPHENHYDRAMINE HCL 25 MG PO CAPS
50.0000 mg | ORAL_CAPSULE | Freq: Once | ORAL | Status: AC
  Administered 2016-03-22: 50 mg via ORAL

## 2016-03-22 MED ORDER — ACETAMINOPHEN 325 MG PO TABS
650.0000 mg | ORAL_TABLET | Freq: Once | ORAL | Status: AC
  Administered 2016-03-22: 650 mg via ORAL

## 2016-03-22 NOTE — Patient Instructions (Signed)
[facility] Discharge Instructions for Patients Receiving Chemotherapy  Today you received the following chemotherapy agents Rituxan.  To help prevent nausea and vomiting after your treatment, we encourage you to take your nausea medication as prescribesd.   If you develop nausea and vomiting that is not controlled by your nausea medication, call the clinic.   BELOW ARE SYMPTOMS THAT SHOULD BE REPORTED IMMEDIATELY:  *FEVER GREATER THAN 100.5 F  *CHILLS WITH OR WITHOUT FEVER  NAUSEA AND VOMITING THAT IS NOT CONTROLLED WITH YOUR NAUSEA MEDICATION  *UNUSUAL SHORTNESS OF BREATH  *UNUSUAL BRUISING OR BLEEDING  TENDERNESS IN MOUTH AND THROAT WITH OR WITHOUT PRESENCE OF ULCERS  *URINARY PROBLEMS  *BOWEL PROBLEMS  UNUSUAL RASH Items with * indicate a potential emergency and should be followed up as soon as possible.  Feel free to call the clinic you have any questions or concerns. The clinic phone number is [phone].  Please show the [place] at check-in to the Emergency Department and triage nurse.

## 2016-03-22 NOTE — Progress Notes (Signed)
Hematology and Oncology Follow Up Visit  Nilynn Ariza [id number] May 07, 1927 83 y.o. 03/22/2016   Principle Diagnosis:   Marginal zone lymphoma  Current Therapy:    Status post cycle 6 of Rituxan/Treanda  Patient to complete maintenance Rituxan - every 3 months -  today     Interim History:  Ms.  Fertitta is back for follow-up. She has tolerated the Rituxan well. This will be her last dose of Rituxan. It has been about 2 years that she has received Rituxan.   Her biggest issues have been with her heart. She's had atrial fibrillation. She went to [facility] earlier last year and this was taken care of. She's had some adjustments with her medications. She does not feel as fatigued.   She's had no problem with infections. She's got through the flu season without any problems.   She's had no problems with nausea or vomiting. She's had no bleeding. She is on ELIQUIS.   Overall, I see her performance status is ECOG 1-2.   Medications:  Current Outpatient Prescriptions:  .  apixaban (ELIQUIS) 2.5 MG TABS tablet, Take 2.5 mg by mouth 2 (two) times daily. , Disp: , Rfl:  .  clindamycin (CLEOCIN-T) 1 % lotion, Apply topically 2 (two) times daily., Disp: 60 mL, Rfl: 3 .  dronedarone (MULTAQ) 400 MG tablet, Take 400 mg by mouth 2 (two) times daily., Disp: , Rfl:  .  metoprolol succinate (TOPROL-XL) 25 MG 24 hr tablet, Take 25 mg by mouth daily., Disp: , Rfl:  .  mirtazapine (REMERON) 30 MG tablet, Take 0.5 tablets (15 mg total) by mouth at bedtime as needed., Disp: 5 tablet, Rfl: 0 .  Multiple Vitamin (THERA) TABS, Take 1 tablet by mouth every morning., Disp: , Rfl:  .  omeprazole (PRILOSEC) 40 MG capsule, Take 40 mg by mouth daily. PRN, Disp: , Rfl:   Allergies: No Known Allergies  Past Medical History, Surgical history, Social history, and Family History were reviewed and updated.  Review of Systems: As above  Physical Exam:  weight is 113 lb (51.3 kg). Her oral temperature is 97.4  F (36.3 C). Her blood pressure is 127/67 and her pulse is 68. Her respiration is 20.   Elderly, petite white female in no obvious distress. Head and neck exam shows no ocular or oral lesions. There are no palpable cervical or supraclavicular lymph nodes. Lungs are clear. Cardiac exam tachycardic and irregular, consistent with atrial fibrillation. There are no murmurs, rubs or bruits. Abdomen is soft. She has good bowel sounds. There is no fluid wave. There is no palpable abdominal mass. There is no palpable liver or spleen tip. Back exam shows no tenderness over the spine, ribs or hips. She has some slight kyphosis. Extremities shows no clubbing, cyanosis or edema. She has a dressing on the left lower leg. She does have a cyst on the left olecranon bursa. This is soft. It is fluctuant. It is non-tender. She has age related osteo-arthritic changes in her joints. She has decent strength in her extremities. Skin exam shows scattered areas that appear to be consistent with squamous cell or basal cell carcinomas. Neurological exam is nonfocal.  Lab Results  Component Value Date   WBC 3.7 (L) 03/22/2016   HGB 13.9 03/22/2016   HCT 42.2 03/22/2016   MCV 94 03/22/2016   PLT 189 03/22/2016     Chemistry      Component Value Date/Time   NA 145 12/24/2015 0837   K 4.4 12/24/2015  0837   CL 106 12/24/2015 0837   CO2 28 12/24/2015 0837   BUN 17 12/24/2015 0837   CREATININE 1.2 12/24/2015 0837      Component Value Date/Time   CALCIUM 9.4 12/24/2015 0837   ALKPHOS 108 (H) 12/24/2015 0837   AST 47 (H) 12/24/2015 0837   ALT 26 12/24/2015 0837   BILITOT 1.00 12/24/2015 0837         Impression and Plan: Ms. Alabi is 83 year old white female. She has marginal zone lymphoma. She had abdominal disease that is fairly extensive.  She has had a very nice response to treatment. As such, I thought that only 6 cycles of Rituxan/Treanda would be needed.  Again, we'll finish up her Rituxan today. We will  not need any kind of follow-up scans.  We will follow her up in 3 months. I think this would be reasonable. I think if he get her through this year with every 3 month follow-up, then we probably can go every 6 months.   I spent about 25 minutes with she and her daughter. I'm just very happy that she is done so well.    [name], MD 2/7/20189:26 AM

## 2016-06-21 ENCOUNTER — Other Ambulatory Visit: Payer: Medicare Other

## 2016-06-21 ENCOUNTER — Ambulatory Visit: Payer: Medicare Other | Admitting: Hematology & Oncology

## 2016-06-26 ENCOUNTER — Ambulatory Visit: Payer: Medicare Other | Admitting: Hematology & Oncology

## 2016-06-26 ENCOUNTER — Other Ambulatory Visit: Payer: Medicare Other

## 2016-07-14 ENCOUNTER — Ambulatory Visit (HOSPITAL_BASED_OUTPATIENT_CLINIC_OR_DEPARTMENT_OTHER): Payer: Medicare Other | Admitting: Hematology & Oncology

## 2016-07-14 ENCOUNTER — Other Ambulatory Visit (HOSPITAL_BASED_OUTPATIENT_CLINIC_OR_DEPARTMENT_OTHER): Payer: Medicare Other

## 2016-07-14 VITALS — BP 116/63 | HR 48 | Temp 97.4°F | Resp 16 | Wt 112.5 lb

## 2016-07-14 DIAGNOSIS — C8583 Other specified types of non-Hodgkin lymphoma, intra-abdominal lymph nodes: Secondary | ICD-10-CM

## 2016-07-14 LAB — CBC WITH DIFFERENTIAL (CANCER CENTER ONLY)
BASO#: 0.1 10*3/uL (ref 0.0–0.2)
BASO%: 1.2 % (ref 0.0–2.0)
EOS ABS: 0.2 10*3/uL (ref 0.0–0.5)
EOS%: 5.6 % (ref 0.0–7.0)
HEMATOCRIT: 42.7 % (ref 34.8–46.6)
HGB: 14 g/dL (ref 11.6–15.9)
LYMPH#: 0.6 10*3/uL — AB (ref 0.9–3.3)
LYMPH%: 14.9 % (ref 14.0–48.0)
MCH: 31.1 pg (ref 26.0–34.0)
MCHC: 32.8 g/dL (ref 32.0–36.0)
MCV: 95 fL (ref 81–101)
MONO#: 0.3 10*3/uL (ref 0.1–0.9)
MONO%: 7 % (ref 0.0–13.0)
NEUT#: 3.1 10*3/uL (ref 1.5–6.5)
NEUT%: 71.3 % (ref 39.6–80.0)
PLATELETS: 224 10*3/uL (ref 145–400)
RBC: 4.5 10*6/uL (ref 3.70–5.32)
RDW: 14 % (ref 11.1–15.7)
WBC: 4.3 10*3/uL (ref 3.9–10.0)

## 2016-07-14 LAB — CMP (CANCER CENTER ONLY)
ALK PHOS: 110 U/L — AB (ref 26–84)
ALT: 22 U/L (ref 10–47)
AST: 39 U/L — ABNORMAL HIGH (ref 11–38)
Albumin: 3.7 g/dL (ref 3.3–5.5)
BUN, Bld: 16 mg/dL (ref 7–22)
CALCIUM: 9.7 mg/dL (ref 8.0–10.3)
CHLORIDE: 107 meq/L (ref 98–108)
CO2: 30 mEq/L (ref 18–33)
Creat: 1.2 mg/dl (ref 0.6–1.2)
GLUCOSE: 94 mg/dL (ref 73–118)
POTASSIUM: 4.1 meq/L (ref 3.3–4.7)
Sodium: 144 mEq/L (ref 128–145)
Total Bilirubin: 1.1 mg/dl (ref 0.20–1.60)
Total Protein: 6.8 g/dL (ref 6.4–8.1)

## 2016-07-14 LAB — LACTATE DEHYDROGENASE: LDH: 170 U/L (ref 125–245)

## 2016-07-14 NOTE — Progress Notes (Signed)
Hematology and Oncology Follow Up Visit  Kathy Massey [id number] 1927/08/16 83 y.o. 07/14/2016   Principle Diagnosis:   Marginal zone lymphoma  Current Therapy:    Status post cycle 6 of Rituxan/Treanda  Patient to complete maintenance Rituxan - every 3 months -  today     Interim History:  [name] is back for follow-up.  She is doing quite well. She did have some skin lesions removed. One was from her forehead. The other was from her right lower leg. Her daughter thinks that one was basal cell and the other was squamous cell. She sees a very good dermatologist.  It sounds like she is now on the waiting list for a retirement center in town. Hopefully she will be able to move in later this year.  She is on ELIQUIS. She does have a heart monitor on. She has not had any cardiac issues.  There's been no fever. She's had no cough. She's had no shortness of breath.  There's been no change in bowel or bladder habits.   Overall, I see her performance status is ECOG 1-2.   Medications:  Current Outpatient Prescriptions:  .  metoprolol succinate (TOPROL-XL) 25 MG 24 hr tablet, Take 25 mg by mouth., Disp: , Rfl:  .  apixaban (ELIQUIS) 2.5 MG TABS tablet, Take 2.5 mg by mouth 2 (two) times daily. , Disp: , Rfl:  .  clindamycin (CLEOCIN-T) 1 % lotion, Apply topically 2 (two) times daily., Disp: 60 mL, Rfl: 3 .  dronedarone (MULTAQ) 400 MG tablet, Take 400 mg by mouth 2 (two) times daily., Disp: , Rfl:  .  mirtazapine (REMERON) 30 MG tablet, Take 0.5 tablets (15 mg total) by mouth at bedtime as needed., Disp: 5 tablet, Rfl: 0 .  Multiple Vitamin (THERA) TABS, Take 1 tablet by mouth every morning., Disp: , Rfl:  .  omeprazole (PRILOSEC) 40 MG capsule, Take 40 mg by mouth daily. PRN, Disp: , Rfl:   Allergies: No Known Allergies  Past Medical History, Surgical history, Social history, and Family History were reviewed and updated.  Review of Systems: As above  Physical Exam:  weight  is 112 lb 8 oz (51 kg). Her oral temperature is 97.4 F (36.3 C). Her blood pressure is 116/63 and her pulse is 48 (abnormal). Her respiration is 16 and oxygen saturation is 99%.   Elderly, petite white female in no obvious distress. Head and neck exam shows no ocular or oral lesions. There are no palpable cervical or supraclavicular lymph nodes. Lungs are clear. Cardiac exam tachycardic and irregular, consistent with atrial fibrillation. There are no murmurs, rubs or bruits. Abdomen is soft. She has good bowel sounds. There is no fluid wave. There is no palpable abdominal mass. There is no palpable liver or spleen tip. Back exam shows no tenderness over the spine, ribs or hips. She has some slight kyphosis. Extremities shows no clubbing, cyanosis or edema. She has a dressing on the left lower leg. She does have a cyst on the left olecranon bursa. This is soft. It is fluctuant. It is non-tender. She has age related osteo-arthritic changes in her joints. She has decent strength in her extremities. Skin exam shows scattered areas that appear to be consistent with squamous cell or basal cell carcinomas. Neurological exam is nonfocal.  Lab Results  Component Value Date   WBC 4.3 07/14/2016   HGB 14.0 07/14/2016   HCT 42.7 07/14/2016   MCV 95 07/14/2016   PLT 224 07/14/2016  Chemistry      Component Value Date/Time   NA 145 03/22/2016 0831   K 4.0 03/22/2016 0831   CL 105 03/22/2016 0831   CO2 28 03/22/2016 0831   BUN 18 03/22/2016 0831   CREATININE 1.2 03/22/2016 0831      Component Value Date/Time   CALCIUM 9.6 03/22/2016 0831   ALKPHOS 141 (H) 03/22/2016 0831   AST 48 (H) 03/22/2016 0831   ALT 29 03/22/2016 0831   BILITOT 1.00 03/22/2016 0831         Impression and Plan: Kathy Massey is 83 year old white female. She has marginal zone lymphoma. She had abdominal disease that is fairly extensive.  At this point, we will just follow her along. She is doing great. I don't see any  problems with respect to recurrent disease.  I think that her prognosis is clearly will be dictated by non--oncologic issues.   We will plan to get her back in 3 months.    [name], MD 6/1/201811:21 AM

## 2016-07-17 LAB — BETA 2 MICROGLOBULIN, SERUM: Beta-2: 2.5 mg/L — ABNORMAL HIGH (ref 0.6–2.4)

## 2016-09-06 ENCOUNTER — Other Ambulatory Visit: Payer: Self-pay | Admitting: Hematology & Oncology

## 2016-09-06 DIAGNOSIS — C8583 Other specified types of non-Hodgkin lymphoma, intra-abdominal lymph nodes: Secondary | ICD-10-CM

## 2016-10-17 ENCOUNTER — Other Ambulatory Visit (HOSPITAL_BASED_OUTPATIENT_CLINIC_OR_DEPARTMENT_OTHER): Payer: Medicare Other

## 2016-10-17 ENCOUNTER — Ambulatory Visit (HOSPITAL_BASED_OUTPATIENT_CLINIC_OR_DEPARTMENT_OTHER): Payer: Medicare Other | Admitting: Hematology & Oncology

## 2016-10-17 VITALS — BP 115/62 | HR 54 | Temp 97.9°F | Resp 15 | Wt 113.0 lb

## 2016-10-17 DIAGNOSIS — C8583 Other specified types of non-Hodgkin lymphoma, intra-abdominal lymph nodes: Secondary | ICD-10-CM | POA: Diagnosis not present

## 2016-10-17 LAB — CMP (CANCER CENTER ONLY)
ALT(SGPT): 21 U/L (ref 10–47)
AST: 44 U/L — ABNORMAL HIGH (ref 11–38)
Albumin: 3.7 g/dL (ref 3.3–5.5)
Alkaline Phosphatase: 101 U/L — ABNORMAL HIGH (ref 26–84)
BILIRUBIN TOTAL: 1 mg/dL (ref 0.20–1.60)
BUN, Bld: 18 mg/dL (ref 7–22)
CO2: 30 mEq/L (ref 18–33)
CREATININE: 1.6 mg/dL — AB (ref 0.6–1.2)
Calcium: 9.4 mg/dL (ref 8.0–10.3)
Chloride: 106 mEq/L (ref 98–108)
Glucose, Bld: 135 mg/dL — ABNORMAL HIGH (ref 73–118)
Potassium: 4.5 mEq/L (ref 3.3–4.7)
SODIUM: 144 meq/L (ref 128–145)
TOTAL PROTEIN: 6.8 g/dL (ref 6.4–8.1)

## 2016-10-17 LAB — CBC WITH DIFFERENTIAL (CANCER CENTER ONLY)
BASO#: 0.1 10*3/uL (ref 0.0–0.2)
BASO%: 1 % (ref 0.0–2.0)
EOS ABS: 0.2 10*3/uL (ref 0.0–0.5)
EOS%: 4.9 % (ref 0.0–7.0)
HEMATOCRIT: 42.2 % (ref 34.8–46.6)
HEMOGLOBIN: 13.9 g/dL (ref 11.6–15.9)
LYMPH#: 0.7 10*3/uL — AB (ref 0.9–3.3)
LYMPH%: 15 % (ref 14.0–48.0)
MCH: 31.5 pg (ref 26.0–34.0)
MCHC: 32.9 g/dL (ref 32.0–36.0)
MCV: 96 fL (ref 81–101)
MONO#: 0.3 10*3/uL (ref 0.1–0.9)
MONO%: 6.3 % (ref 0.0–13.0)
NEUT%: 72.8 % (ref 39.6–80.0)
NEUTROS ABS: 3.6 10*3/uL (ref 1.5–6.5)
Platelets: 240 10*3/uL (ref 145–400)
RBC: 4.41 10*6/uL (ref 3.70–5.32)
RDW: 13.6 % (ref 11.1–15.7)
WBC: 4.9 10*3/uL (ref 3.9–10.0)

## 2016-10-17 LAB — LACTATE DEHYDROGENASE: LDH: 176 U/L (ref 125–245)

## 2016-10-17 NOTE — Progress Notes (Signed)
Hematology and Oncology Follow Up Visit  Kathy Massey [id number] [birth year] 83 y.o. 10/17/2016   Principle Diagnosis:   Marginal zone lymphoma  Current Therapy:    Status post cycle 6 of Rituxan/Treanda  Patient to complete maintenance Rituxan - every 3 months -  today     Interim History:  [name] is back for follow-up.  She is doing okay. She's had a pretty decent summer. The big news is that she will be moving to assisted living. This probably will be within the month or so. She is a little apprehensive about this.  She's had no nausea or vomiting. She's had no cough. She's had no cardiac issues. She does have atrial fibrillation. She is on ELIQUIS. There's been no bleeding.  She's had no change in bowel or bladder habits.  She does have bad skin issues. She has had basal cell carcinomas removed.  She and Kathy Massey as she just got back from the beach. There were at [facility]. They had a wonderful time.  She's had no headache. There is been no leg swelling.  Overall, Kathy performance status is ECOG 2.   Medications:  Current Outpatient Prescriptions:  .  metoprolol tartrate (LOPRESSOR) 25 MG tablet, Take 25 mg by mouth., Disp: , Rfl:  .  apixaban (ELIQUIS) 2.5 MG TABS tablet, Take 2.5 mg by mouth 2 (two) times daily. , Disp: , Rfl:  .  clindamycin (CLEOCIN T) 1 % lotion, APPLY TOPICALLY 2 (TWO) TIMES DAILY., Disp: 60 mL, Rfl: 1 .  dronedarone (MULTAQ) 400 MG tablet, Take 400 mg by mouth 2 (two) times daily., Disp: , Rfl:  .  mirtazapine (REMERON) 30 MG tablet, Take 0.5 tablets (15 mg total) by mouth at bedtime as needed., Disp: 5 tablet, Rfl: 0 .  Multiple Vitamin (THERA) TABS, Take 1 tablet by mouth every morning., Disp: , Rfl:  .  omeprazole (PRILOSEC) 40 MG capsule, Take 40 mg by mouth daily. PRN, Disp: , Rfl:   Allergies: No Known Allergies  Past Medical History, Surgical history, Social history, and Family History were reviewed and updated.  Review of  Systems: As stated in the interim history  Physical Exam:  weight is 113 lb (51.3 kg). Kathy oral temperature is 97.9 F (36.6 C). Kathy blood pressure is 115/62 and Kathy pulse is 54 (abnormal). Kathy respiration is 15 and oxygen saturation is 99%.   Physical Exam  Constitutional: She is oriented to person, place, and time.  HENT:  Head: Normocephalic and atraumatic.  Mouth/Throat: Oropharynx is clear and moist.  Eyes: Pupils are equal, round, and reactive to light. EOM are normal.  Neck: Normal range of motion.  Cardiovascular:  Irregular rate and rhythm consistent with atrial fibrillation. The rate is very well controlled. I hear no murmurs rubs or bruits.  Pulmonary/Chest: Effort normal and breath sounds normal.  Abdominal: Soft. Bowel sounds are normal.  Musculoskeletal: Normal range of motion. She exhibits no edema, tenderness or deformity.  Lymphadenopathy:    She has no cervical adenopathy.  Neurological: She is alert and oriented to person, place, and time.  Skin: Skin is warm and dry. No rash noted. No erythema.  Psychiatric: She has a normal mood and affect. Kathy behavior is normal. Judgment and thought content normal.  Vitals reviewed.  cal.  Lab Results  Component Value Date   WBC 4.9 10/17/2016   HGB 13.9 10/17/2016   HCT 42.2 10/17/2016   MCV 96 10/17/2016   PLT 240 10/17/2016  Chemistry      Component Value Date/Time   NA 144 10/17/2016 1000   K 4.5 10/17/2016 1000   CL 106 10/17/2016 1000   CO2 30 10/17/2016 1000   BUN 18 10/17/2016 1000   CREATININE 1.6 (H) 10/17/2016 1000      Component Value Date/Time   CALCIUM 9.4 10/17/2016 1000   ALKPHOS 101 (H) 10/17/2016 1000   AST 44 (H) 10/17/2016 1000   ALT 21 10/17/2016 1000   BILITOT 1.00 10/17/2016 1000         Impression and Plan: Kathy Massey is 83 year old white female. She has marginal zone lymphoma. She had abdominal disease that is fairly extensive.  From my point, everything looks okay. I  still believe that Kathy prognosis will be based upon Kathy heart issues and not from Kathy marginal zone lymphoma.  She is going to a nursing home. She is a little apprehensive about this. I think that this probably will be a very good idea for Kathy. I know that she will enjoy this and have comfort and knowing that there will be assistance for Kathy 24 hours a day.  I will see Kathy back in 3 more months.  I did recommend to Kathy Massey that Kathy Massey see one of the new cardiologist in our building. This be a lot easier for Kathy than having to go to [facility] or [facility]. She appreciated my suggestion.   [name], MD 9/4/201810:42 AM

## 2016-10-18 LAB — BETA 2 MICROGLOBULIN, SERUM: BETA 2: 2.2 mg/L (ref 0.6–2.4)

## 2016-10-20 ENCOUNTER — Other Ambulatory Visit: Payer: Medicare Other

## 2016-10-20 ENCOUNTER — Ambulatory Visit: Payer: Medicare Other | Admitting: Hematology & Oncology

## 2016-11-08 ENCOUNTER — Telehealth: Payer: Self-pay | Admitting: *Deleted

## 2016-11-08 NOTE — Telephone Encounter (Signed)
NOTES SENT TO SCHEDULING.  °

## 2016-11-10 ENCOUNTER — Encounter: Payer: Self-pay | Admitting: Cardiology

## 2016-11-21 ENCOUNTER — Institutional Professional Consult (permissible substitution): Payer: Medicare Other | Admitting: Cardiology

## 2016-12-04 ENCOUNTER — Encounter: Payer: Self-pay | Admitting: *Deleted

## 2016-12-04 ENCOUNTER — Encounter: Payer: Self-pay | Admitting: Cardiology

## 2016-12-04 ENCOUNTER — Ambulatory Visit (INDEPENDENT_AMBULATORY_CARE_PROVIDER_SITE_OTHER): Payer: Medicare Other | Admitting: Cardiology

## 2016-12-04 VITALS — BP 114/82 | HR 66 | Ht 60.0 in | Wt 113.2 lb

## 2016-12-04 DIAGNOSIS — I5181 Takotsubo syndrome: Secondary | ICD-10-CM

## 2016-12-04 DIAGNOSIS — I480 Paroxysmal atrial fibrillation: Secondary | ICD-10-CM

## 2016-12-04 NOTE — Progress Notes (Signed)
Electrophysiology Office Note   Date:  12/04/2016   ID:  Kathy Massey, DOB 1928/02/03, MRN [id number]  PCP:  Raelene Bott, MD  Cardiologist:   Primary Electrophysiologist:  Adonias Demore Meredith Leeds, MD    Chief Complaint  Patient presents with  . Advice Only    PAF     History of Present Illness: Kathy Massey is a 83 y.o. female who is being seen today for the evaluation of atrial fibrillation at the request of Raelene Bott, MD. Presenting today for electrophysiology evaluation. She has a history of paroxysmal atrial fibrillation on Eliquis as well as stress-induced cardiomyopathy. She currently takes Multaq and amiodarone. Occasionally, she has heart rates in the low 40s. 2-D is awake she has taken as needed metoprolol for palpitations. She feels fatigued but is improved by the late morning and afternoon.    Today, she denies symptoms of palpitations, chest pain, shortness of breath, orthopnea, PND, lower extremity edema, claudication, presyncope, syncope, bleeding, or neurologic sequela. The patient is tolerating medications without difficulties. She does feel dizzy today. She also has occasional dyspnea on exertion and weakness. There are some days where she feels just fine. She feels like the time that she feels poorly are due to her atrial fibrillation.   Past Medical History:  Diagnosis Date  . 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 ([place]) 12/11/2013   History reviewed. No pertinent surgical history.   Current Outpatient Prescriptions  Medication Sig Dispense Refill  . apixaban (ELIQUIS) 2.5 MG TABS tablet Take 2.5 mg by mouth 2 (two) times daily.     . clindamycin (CLEOCIN T) 1 % lotion APPLY TOPICALLY 2 (TWO) TIMES DAILY. 60 mL 1  . dronedarone (MULTAQ) 400 MG tablet Take 400 mg by mouth 2 (two) times daily.    . metoprolol tartrate (LOPRESSOR) 25 MG tablet Take 25 mg by mouth.    . mirtazapine (REMERON) 30 MG tablet Take 0.5 tablets (15 mg total) by  mouth at bedtime as needed. 5 tablet 0  . Multiple Vitamin (THERA) TABS Take 1 tablet by mouth every morning.     No current facility-administered medications for this visit.     Allergies:   Patient has no known allergies.   Social History:  The patient  reports that she has never smoked. She has never used smokeless tobacco. She reports that she does not drink alcohol or use drugs.   Family History:  The patient's family history includes Heart disease in her father and mother.    ROS:  Please see the history of present illness.   Otherwise, review of systems is positive for dyspnea on exertion, diarrhea, back pain, balance problems, dizziness, easy bruising.   All other systems are reviewed and negative.    PHYSICAL EXAM: VS:  BP 114/82   Pulse 66   Ht 5' (1.524 m)   Wt 113 lb 3.2 oz (51.3 kg)   SpO2 98%   BMI 22.11 kg/m  , BMI Body mass index is 22.11 kg/m. GEN: Well nourished, well developed, in no acute distress  HEENT: normal  Neck: no JVD, carotid bruits, or masses Cardiac: RRR; no murmurs, rubs, or gallops,no edema  Respiratory:  clear to auscultation bilaterally, normal work of breathing GI: soft, nontender, nondistended, + BS MS: no deformity or atrophy  Skin: warm and dry Neuro:  Strength and sensation are intact Psych: euthymic mood, full affect  EKG:  EKG is ordered today. Personal review of the ekg ordered shows atrial fibrillation, rate  66  Recent Labs: 10/17/2016: ALT(SGPT) 21; BUN, Bld 18; Creat 1.6; HGB 13.9; Platelets 240; Potassium 4.5; Sodium 144    Lipid Panel  No results found for: CHOL, TRIG, HDL, CHOLHDL, VLDL, LDLCALC, LDLDIRECT   Wt Readings from Last 3 Encounters:  12/04/16 113 lb 3.2 oz (51.3 kg)  10/17/16 113 lb (51.3 kg)  07/14/16 112 lb 8 oz (51 kg)      Other studies Reviewed: Additional studies/ records that were reviewed today include: TTE 06/16/15  Review of the above records today demonstrates:   Normal left ventricular  systolic function, ejection fraction > 63%  Diastolic dysfunction - grade I (normal filling pressures)  Dilated left atrium - mild  Degenerative mitral valve disease  Tricuspid regurgitation - mild  Pulmonic regurgitation - mild  Normal right ventricular systolic function  Dilated right atrium - mild  Cardiac cath 06/15/15 1. No angiographically apparent coronary artery disease 2. Normal LV filling pressures  ASSESSMENT AND PLAN:  1.  Paroxysmal atrial fibrillation: Rate controlled with metoprolol. Currently on Eliquis. She also takes Multaq. She feels very well controlled with Multaq, though she does not know how often she is in atrial fibrillation. I have asked her to keep track of her atrial fibrillation burden, and we Quianna Avery potentially make medication changes based on that.  This patients CHA2DS2-VASc Score and unadjusted Ischemic Stroke Rate (% per year) is equal to 3.2 % stroke rate/year from a score of 3  Above score calculated as 1 point each if present [CHF, HTN, DM, Vascular=MI/PAD/Aortic Plaque, Age if 65-74, or Female] Above score calculated as 2 points each if present [Age > 75, or Stroke/TIA/TE]  2. Stress induced myopathy: Ejection fraction has apparently normalized. Continue metoprolol.    Current medicines are reviewed at length with the patient today.   The patient does not have concerns regarding her medicines.  The following changes were made today:  none  Labs/ tests ordered today include:  Orders Placed This Encounter  Procedures  . EKG 12-Lead     Disposition:   FU with Sachiko Methot 1 month  Signed, Imran Nuon Meredith Leeds, MD  12/04/2016 2:47 PM     [address] [place] [address] [phone] (office) [phone] (fax)

## 2016-12-04 NOTE — Patient Instructions (Addendum)
Medication Instructions:  Your physician recommends that you continue on your current medications as directed. Please refer to the Current Medication list given to you today.  Labwork: None ordered  Testing/Procedures: None ordered  Follow-Up: Your physician recommends that you schedule a follow-up appointment in: 1 month, in [facility], with Dr. Curt Bears  -- If you need a refill on your cardiac medications before your next appointment, please call your pharmacy. --  Thank you for choosing [facility]!!   Trinidad Curet, RN [phone]

## 2017-01-02 ENCOUNTER — Ambulatory Visit: Payer: Medicare Other | Admitting: Cardiology

## 2017-01-09 ENCOUNTER — Encounter: Payer: Self-pay | Admitting: Cardiology

## 2017-01-09 ENCOUNTER — Encounter (INDEPENDENT_AMBULATORY_CARE_PROVIDER_SITE_OTHER): Payer: Self-pay

## 2017-01-09 ENCOUNTER — Ambulatory Visit: Payer: Medicare Other | Admitting: Cardiology

## 2017-01-09 VITALS — BP 120/66 | HR 47 | Ht 61.0 in | Wt 114.2 lb

## 2017-01-09 DIAGNOSIS — I5181 Takotsubo syndrome: Secondary | ICD-10-CM

## 2017-01-09 DIAGNOSIS — I480 Paroxysmal atrial fibrillation: Secondary | ICD-10-CM

## 2017-01-09 NOTE — Patient Instructions (Addendum)
Medication Instructions:  Your physician has recommended you make the following change in your medication: 1. STOP METOPROLOL TARTRATE (LOPRESSOR)  * If you need a refill on your cardiac medications before your next appointment, please call your pharmacy. *  Labwork: None ordered  Testing/Procedures: Your physician has recommended that you wear a 48 hour holter monitor. Holter monitors are medical devices that record the heart's electrical activity. Doctors most often use these monitors to diagnose arrhythmias. Arrhythmias are problems with the speed or rhythm of the heartbeat. The monitor is a small, portable device. You can wear one while you do your normal daily activities. This is usually used to diagnose what is causing palpitations/syncope (passing out).  Follow-Up: Your physician recommends that you schedule a follow-up appointment in: 3 months with Dr. Curt Bears.  Thank you for choosing [facility]!!   Trinidad Curet, RN [phone]  Any Other Special Instructions Will Be Listed Below (If Applicable).   Holter Monitoring A Holter monitor is a small device that is used to detect abnormal heart rhythms. It clips to your clothing and is connected by wires to flat, sticky disks (electrodes) that attach to your chest. It is worn continuously for 24-48 hours. Follow these instructions at home:  Wear your Holter monitor at all times, even while exercising and sleeping, for as long as directed by your health care provider.  Make sure that the Holter monitor is safely clipped to your clothing or close to your body as recommended by your health care provider.  Do not get the monitor or wires wet.  Do not put body lotion or moisturizer on your chest.  Keep your skin clean.  Keep a diary of your daily activities, such as walking and doing chores. If you feel that your heartbeat is abnormal or that your heart is fluttering or skipping a beat: ? Record what you are doing when it  happens. ? Record what time of day the symptoms occur.  Return your Holter monitor as directed by your health care provider.  Keep all follow-up visits as directed by your health care provider. This is important. Get help right away if:  You feel lightheaded or you faint.  You have trouble breathing.  You feel pain in your chest, upper arm, or jaw.  You feel sick to your stomach and your skin is pale, cool, or damp.  You heartbeat feels unusual or abnormal. This information is not intended to replace advice given to you by your health care provider. Make sure you discuss any questions you have with your health care provider. Document Released: 10/29/2003 Document Revised: 07/08/2015 Document Reviewed: 09/08/2013 Elsevier Interactive Patient Education  [employer].

## 2017-01-09 NOTE — Progress Notes (Signed)
Electrophysiology Office Note   Date:  01/09/2017   ID:  Kathy Massey, DOB September 09, 1927, MRN [id number]  PCP:  Kathy Bott, MD  Cardiologist:   Primary Electrophysiologist:  Kathy Kennard Meredith Leeds, MD    Chief Complaint  Patient presents with  . Follow-up    PAF     History of Present Illness: Kathy Massey is a 83 y.o. female who is being seen today for the evaluation of atrial fibrillation at the request of Kathy Bott, MD. Presenting today for electrophysiology evaluation. She has a history of paroxysmal atrial fibrillation on Eliquis as well as stress-induced cardiomyopathy. She currently takes Multaq and amiodarone. Occasionally, she has heart rates in the low 40s.   Today, denies symptoms of palpitations, chest pain, shortness of breath, orthopnea, PND, lower extremity edema, claudication, dizziness, presyncope, syncope, bleeding, or neurologic sequela. The patient is tolerating medications without difficulties. She continues to have good days and bad days. Her main symptoms are of fatigue and weakness. She otherwise has felt well.     Past Medical History:  Diagnosis Date  . 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   Current Outpatient Medications  Medication Sig Dispense Refill  . apixaban (ELIQUIS) 2.5 MG TABS tablet Take 2.5 mg by mouth 2 (two) times daily.     . clindamycin (CLEOCIN T) 1 % lotion APPLY TOPICALLY 2 (TWO) TIMES DAILY. 60 mL 1  . dronedarone (MULTAQ) 400 MG tablet Take 400 mg by mouth 2 (two) times daily.    . mirtazapine (REMERON) 30 MG tablet Take 0.5 tablets (15 mg total) by mouth at bedtime as needed. 5 tablet 0  . Multiple Vitamin (THERA) TABS Take 1 tablet by mouth every morning.     No current facility-administered medications for this visit.     Allergies:   Patient has no known allergies.   Social History:  The patient  reports that  has never smoked. she has never used  smokeless tobacco. She reports that she does not drink alcohol or use drugs.   Family History:  The patient's family history includes Heart disease in her father and mother.    ROS:  Please see the history of present illness.   Otherwise, review of systems is positive for palpitations, DOE, hearing loss, back pain, easy bruising.   All other systems are reviewed and negative .   PHYSICAL EXAM: VS:  BP 120/66   Pulse (!) 47   Ht 5\' 1"  (1.549 m)   Wt 114 lb 3.2 oz (51.8 kg)   SpO2 99%   BMI 21.58 kg/m  , BMI Body mass index is 21.58 kg/m. GEN: Well nourished, well developed, in no acute distress  HEENT: normal  Neck: no JVD, carotid bruits, or masses Cardiac: RRR; no murmurs, rubs, or gallops,no edema  Respiratory:  clear to auscultation bilaterally, normal work of breathing GI: soft, nontender, nondistended, + BS MS: no deformity or atrophy  Skin: warm and dry Neuro:  Strength and sensation are intact Psych: euthymic mood, full affect  EKG:  EKG is not ordered today. Personal review of the ekg ordered 12/04/16 shows atrial fibrillation, rate 66  Recent Labs: 10/17/2016: ALT(SGPT) 21; BUN, Bld 18; Creat 1.6; HGB 13.9; Platelets 240; Potassium 4.5; Sodium 144    Lipid Panel  No results found for: CHOL, TRIG, HDL, CHOLHDL, VLDL, LDLCALC, LDLDIRECT   Wt Readings from Last 3 Encounters:  01/09/17 114 lb 3.2 oz (51.8 kg)  12/04/16 113 lb 3.2 oz (51.3 kg)  10/17/16 113 lb (51.3 kg)      Other studies Reviewed: Additional studies/ records that were reviewed today include: TTE 06/16/15  Review of the above records today demonstrates:   Normal left ventricular systolic function, ejection fraction > 57%  Diastolic dysfunction - grade I (normal filling pressures)  Dilated left atrium - mild  Degenerative mitral valve disease  Tricuspid regurgitation - mild  Pulmonic regurgitation - mild  Normal right ventricular systolic function  Dilated right atrium - mild  Cardiac  cath 06/15/15 1. No angiographically apparent coronary artery disease 2. Normal LV filling pressures  ASSESSMENT AND PLAN:  1.  Paroxysmal atrial fibrillation: On Eliquis, Multaq, and metoprolol. HR is slow today and she is feeling fatigued and dizzy. Kathy Massey stop metoprolol. To get a better idea of her AF burden, Kathy Massey order a 48 hour monitor. She may require pacemaker implant if her fatigue continues for sick sinus syndrome.   This patients CHA2DS2-VASc Score and unadjusted Ischemic Stroke Rate (% per year) is equal to 3.2 % stroke rate/year from a score of 3  Above score calculated as 1 point each if present [CHF, HTN, DM, Vascular=MI/PAD/Aortic Plaque, Age if 65-74, or Female] Above score calculated as 2 points each if present [Age > 75, or Stroke/TIA/TE]   2. Stress induced myopathy: EF has normalized per prior report. No changes.    Current medicines are reviewed at length with the patient today.   The patient does not have concerns regarding her medicines.  The following changes were made today:  none  Labs/ tests ordered today include:  Orders Placed This Encounter  Procedures  . Holter monitor - 48 hour     Disposition:   FU with Kathy Massey 3 month  Signed, Kathy Towson Meredith Leeds, MD  01/09/2017 1:21 PM     [place] [address] [phone] (office) [phone] (fax)

## 2017-01-16 ENCOUNTER — Ambulatory Visit: Payer: Medicare Other | Admitting: Hematology & Oncology

## 2017-01-16 ENCOUNTER — Other Ambulatory Visit: Payer: Medicare Other

## 2017-01-22 ENCOUNTER — Other Ambulatory Visit: Payer: Medicare Other

## 2017-01-22 ENCOUNTER — Ambulatory Visit: Payer: Medicare Other | Admitting: Hematology & Oncology

## 2017-02-05 ENCOUNTER — Ambulatory Visit (INDEPENDENT_AMBULATORY_CARE_PROVIDER_SITE_OTHER): Payer: Medicare Other

## 2017-02-05 DIAGNOSIS — I480 Paroxysmal atrial fibrillation: Secondary | ICD-10-CM | POA: Diagnosis not present

## 2017-02-15 ENCOUNTER — Encounter: Payer: Self-pay | Admitting: Cardiology

## 2017-02-15 NOTE — Telephone Encounter (Signed)
Dtr tells me that she really isn't sure if pt is different/improved.  "Mom has been like this for so long she isn't sure if this isn't the way its supposed to be". Reports pt still has no energy, especially "when HR goes real low". Dtr reports having a hard time judging if mom is the same/better.  Mom will tell her one thing and tell doctor something different. Pt currently still walks the dog 1 mile/day and still goes on with normal life.   Pt next in line to move into [facility] in [place].  Dtr would like to know the risks involved with someone her age having so much AFib. Which is best course of action for pt -- PPM or DCCV/antiarrythmics? She understands I will discuss w/ Dr. Ileana Ladd next week, when he returns to the office, and call her w/ recommendation/s.

## 2017-02-22 ENCOUNTER — Encounter: Payer: Self-pay | Admitting: *Deleted

## 2017-02-26 ENCOUNTER — Ambulatory Visit: Payer: Medicare Other | Admitting: Hematology & Oncology

## 2017-02-26 ENCOUNTER — Other Ambulatory Visit: Payer: Medicare Other

## 2017-02-27 ENCOUNTER — Inpatient Hospital Stay (HOSPITAL_BASED_OUTPATIENT_CLINIC_OR_DEPARTMENT_OTHER): Payer: Medicare Other | Admitting: Hematology & Oncology

## 2017-02-27 ENCOUNTER — Other Ambulatory Visit: Payer: Self-pay

## 2017-02-27 ENCOUNTER — Inpatient Hospital Stay: Payer: Medicare Other | Attending: Hematology & Oncology

## 2017-02-27 VITALS — BP 118/70 | HR 62 | Temp 97.4°F | Resp 20 | Wt 114.5 lb

## 2017-02-27 DIAGNOSIS — I4891 Unspecified atrial fibrillation: Secondary | ICD-10-CM | POA: Insufficient documentation

## 2017-02-27 DIAGNOSIS — Z9221 Personal history of antineoplastic chemotherapy: Secondary | ICD-10-CM

## 2017-02-27 DIAGNOSIS — M7989 Other specified soft tissue disorders: Secondary | ICD-10-CM | POA: Diagnosis not present

## 2017-02-27 DIAGNOSIS — Z7901 Long term (current) use of anticoagulants: Secondary | ICD-10-CM | POA: Diagnosis not present

## 2017-02-27 DIAGNOSIS — R197 Diarrhea, unspecified: Secondary | ICD-10-CM | POA: Diagnosis not present

## 2017-02-27 DIAGNOSIS — C8583 Other specified types of non-Hodgkin lymphoma, intra-abdominal lymph nodes: Secondary | ICD-10-CM

## 2017-02-27 DIAGNOSIS — C8300 Small cell B-cell lymphoma, unspecified site: Secondary | ICD-10-CM | POA: Diagnosis not present

## 2017-02-27 DIAGNOSIS — R0602 Shortness of breath: Secondary | ICD-10-CM

## 2017-02-27 DIAGNOSIS — K5900 Constipation, unspecified: Secondary | ICD-10-CM | POA: Insufficient documentation

## 2017-02-27 DIAGNOSIS — Z79899 Other long term (current) drug therapy: Secondary | ICD-10-CM | POA: Insufficient documentation

## 2017-02-27 LAB — CBC WITH DIFFERENTIAL (CANCER CENTER ONLY)
BASOS PCT: 1 %
Basophils Absolute: 0.1 10*3/uL (ref 0.0–0.1)
EOS ABS: 0.1 10*3/uL (ref 0.0–0.5)
EOS PCT: 3 %
HCT: 42.9 % (ref 34.8–46.6)
HEMOGLOBIN: 13.8 g/dL (ref 11.6–15.9)
LYMPHS ABS: 0.7 10*3/uL — AB (ref 0.9–3.3)
Lymphocytes Relative: 12 %
MCH: 31 pg (ref 26.0–34.0)
MCHC: 32.2 g/dL (ref 32.0–36.0)
MCV: 96.4 fL (ref 81.0–101.0)
Monocytes Absolute: 0.4 10*3/uL (ref 0.1–0.9)
Monocytes Relative: 7 %
NEUTROS PCT: 77 %
Neutro Abs: 4.3 10*3/uL (ref 1.5–6.5)
PLATELETS: 252 10*3/uL (ref 145–400)
RBC: 4.45 MIL/uL (ref 3.70–5.32)
RDW: 13.6 % (ref 11.1–15.7)
WBC: 5.6 10*3/uL (ref 3.9–10.3)

## 2017-02-27 LAB — CMP (CANCER CENTER ONLY)
ALK PHOS: 127 U/L (ref 40–150)
ALT: 17 U/L (ref 0–55)
AST: 30 U/L (ref 5–34)
Albumin: 3.9 g/dL (ref 3.5–5.0)
Anion gap: 7 (ref 3–11)
BUN: 23 mg/dL (ref 7–26)
CALCIUM: 9.6 mg/dL (ref 8.4–10.4)
CHLORIDE: 108 mmol/L (ref 98–109)
CO2: 27 mmol/L (ref 22–29)
CREATININE: 1.37 mg/dL — AB (ref 0.60–1.10)
GFR, Est AFR Am: 39 mL/min — ABNORMAL LOW (ref >60)
GFR, Estimated: 34 mL/min — ABNORMAL LOW (ref >60)
Glucose, Bld: 100 mg/dL (ref 70–140)
Potassium: 4.6 mmol/L (ref 3.3–4.7)
SODIUM: 142 mmol/L (ref 136–145)
Total Bilirubin: 0.7 mg/dL (ref 0.2–1.2)
Total Protein: 6.6 g/dL (ref 6.4–8.3)

## 2017-02-27 NOTE — Progress Notes (Signed)
Hematology and Oncology Follow Up Visit  Kathy Massey [id number] 01/25/1928 84 y.o. 02/27/2017   Principle Diagnosis:   Marginal zone lymphoma  Current Therapy:    Status post cycle 6 of Rituxan/Treanda  Patient to complete maintenance Rituxan - every 3 months -  today     Interim History:  Kathy Massey is back for follow-up.  The big news now is that she may need to have a change in her medications for her atrial fibrillation.  She has seen a new cardiologist.  He wants to put her on amiodarone.  She has had a Holter monitor.  This is only for 3 days.  It sounds like, according to her daughter, that this might need to be done for 2 or 3 weeks.  She has had no bleeding.  She has had no fever.  She has occasional shortness of breath.  Currently, she is on Multaq.  She has had no problems with bowels or bladder.  She has occasional diarrhea and also occasional constipation.  She has had no headache.  She has had no blurred vision.  She has had occasional leg swelling.  She still not be able to move into assisted living.  She is still waiting for an apartment to open up.  Overall, her performance status is ECOG 2.   Medications:  Current Outpatient Medications:  .  apixaban (ELIQUIS) 2.5 MG TABS tablet, Take 2.5 mg by mouth 2 (two) times daily. , Disp: , Rfl:  .  clindamycin (CLEOCIN T) 1 % lotion, APPLY TOPICALLY 2 (TWO) TIMES DAILY., Disp: 60 mL, Rfl: 1 .  dronedarone (MULTAQ) 400 MG tablet, Take 400 mg by mouth 2 (two) times daily., Disp: , Rfl:  .  mirtazapine (REMERON) 30 MG tablet, Take 0.5 tablets (15 mg total) by mouth at bedtime as needed., Disp: 5 tablet, Rfl: 0 .  Multiple Vitamin (THERA) TABS, Take 1 tablet by mouth every morning., Disp: , Rfl:   Allergies: No Known Allergies  Past Medical History, Surgical history, Social history, and Family History were reviewed and updated.  Review of Systems: As stated in the interim history  Physical Exam:  weight is  114 lb 8 oz (51.9 kg). Her oral temperature is 97.4 F (36.3 C) (abnormal). Her blood pressure is 118/70 and her pulse is 62. Her respiration is 20 and oxygen saturation is 100%.   Physical Exam  Constitutional: She is oriented to person, place, and time.  HENT:  Head: Normocephalic and atraumatic.  Mouth/Throat: Oropharynx is clear and moist.  Eyes: EOM are normal. Pupils are equal, round, and reactive to light.  Neck: Normal range of motion.  Cardiovascular:  Irregular rate and rhythm consistent with atrial fibrillation. The rate is very well controlled. I hear no murmurs rubs or bruits.  Pulmonary/Chest: Effort normal and breath sounds normal.  Abdominal: Soft. Bowel sounds are normal.  Musculoskeletal: Normal range of motion. She exhibits no edema, tenderness or deformity.  Lymphadenopathy:    She has no cervical adenopathy.  Neurological: She is alert and oriented to person, place, and time.  Skin: Skin is warm and dry. No rash noted. No erythema.  Psychiatric: She has a normal mood and affect. Her behavior is normal. Judgment and thought content normal.  Vitals reviewed.  cal.  Lab Results  Component Value Date   WBC 4.9 10/17/2016   HGB 13.9 10/17/2016   HCT 42.9 02/27/2017   MCV 96.4 02/27/2017   PLT 240 10/17/2016     Chemistry  Component Value Date/Time   NA 144 10/17/2016 1000   K 4.5 10/17/2016 1000   CL 106 10/17/2016 1000   CO2 30 10/17/2016 1000   BUN 18 10/17/2016 1000   CREATININE 1.6 (H) 10/17/2016 1000      Component Value Date/Time   CALCIUM 9.4 10/17/2016 1000   ALKPHOS 101 (H) 10/17/2016 1000   AST 44 (H) 10/17/2016 1000   ALT 21 10/17/2016 1000   BILITOT 1.00 10/17/2016 1000         Impression and Plan: Kathy Massey is 84 year old white female. She has marginal zone lymphoma. She had abdominal disease that is fairly extensive.  From my point, everything looks okay. I still believe that her prognosis will be based upon her heart issues  and not from her marginal zone lymphoma.  From my perspective, I do not see any problem with her being placed onto amiodarone.  I know that we do have to watch out for blood counts.  I would like to see her back in 4 more months.  We will have to be cautious and watch her blood counts closely.   [name], MD 1/15/201912:42 PM

## 2017-02-28 LAB — LACTATE DEHYDROGENASE: LDH: 173 U/L (ref 125–245)

## 2017-02-28 LAB — BETA 2 MICROGLOBULIN, SERUM: Beta-2 Microglobulin: 2.1 mg/L (ref 0.6–2.4)

## 2017-03-02 NOTE — Telephone Encounter (Signed)
dtr and pt would like to discuss with Dr. Curt Bears further/more detail. Made aware office will call next week to arrange sooner OV. [name] is agreeable and thanks me for calling.

## 2017-03-21 ENCOUNTER — Ambulatory Visit: Payer: Medicare Other | Admitting: Cardiology

## 2017-03-21 ENCOUNTER — Encounter: Payer: Self-pay | Admitting: Cardiology

## 2017-03-21 VITALS — BP 107/71 | HR 75 | Ht 61.5 in | Wt 114.0 lb

## 2017-03-21 DIAGNOSIS — I5181 Takotsubo syndrome: Secondary | ICD-10-CM | POA: Diagnosis not present

## 2017-03-21 DIAGNOSIS — I4819 Other persistent atrial fibrillation: Secondary | ICD-10-CM

## 2017-03-21 DIAGNOSIS — I481 Persistent atrial fibrillation: Secondary | ICD-10-CM | POA: Diagnosis not present

## 2017-03-21 MED ORDER — DILTIAZEM HCL ER COATED BEADS 120 MG PO CP24: 120.0000 mg | ORAL_CAPSULE | Freq: Every day | ORAL | 6 refills | Status: DC

## 2017-03-21 NOTE — Progress Notes (Signed)
Electrophysiology Office Note   Date:  03/21/2017   ID:  Kathy Massey, DOB 1927-10-02, MRN [id number]  PCP:  Raelene Bott, MD  Cardiologist:   Primary Electrophysiologist:  Soleia Badolato Meredith Leeds, MD    Chief Complaint  Patient presents with  . Follow-up    PAF     History of Present Illness: Kathy Massey is a 84 y.o. female who is being seen today for the evaluation of atrial fibrillation at the request of Raelene Bott, MD. Presenting today for electrophysiology evaluation. She has a history of paroxysmal atrial fibrillation on Eliquis as well as stress-induced cardiomyopathy. She currently takes Multaq and amiodarone. Occasionally, she has heart rates in the low 40s.   Today, denies symptoms of palpitations, chest pain, shortness of breath, orthopnea, PND, lower extremity edema, claudication, dizziness, presyncope, syncope, bleeding, or neurologic sequela. The patient is tolerating medications without difficulties.  Currently she is feeling well.  She at times feels fatigued and swimmy headed.  She has not noted excess symptoms due to atrial fibrillation.   Past Medical History:  Diagnosis Date  . 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 No pertinent surgical history.   Current Outpatient Medications  Medication Sig Dispense Refill  . apixaban (ELIQUIS) 2.5 MG TABS tablet Take 2.5 mg by mouth 2 (two) times daily.     . clindamycin (CLEOCIN T) 1 % lotion APPLY TOPICALLY 2 (TWO) TIMES DAILY. 60 mL 1  . dronedarone (MULTAQ) 400 MG tablet Take 400 mg by mouth 2 (two) times daily.    . mirtazapine (REMERON) 30 MG tablet Take 0.5 tablets (15 mg total) by mouth at bedtime as needed. 5 tablet 0  . Multiple Vitamin (THERA) TABS Take 1 tablet by mouth every morning.     No current facility-administered medications for this visit.     Allergies:   Patient has no known allergies.   Social History:  The patient  reports that  has  never smoked. she has never used smokeless tobacco. She reports that she does not drink alcohol or use drugs.   Family History:  The patient's family history includes Heart disease in her father and mother.   ROS:  Please see the history of present illness.   Otherwise, review of systems is positive for fatigue.   All other systems are reviewed and negative.   PHYSICAL EXAM: VS:  BP 107/71   Pulse 75   Ht 5' 1.5" (1.562 m)   Wt 114 lb (51.7 kg)   BMI 21.19 kg/m  , BMI Body mass index is 21.19 kg/m. GEN: Well nourished, well developed, in no acute distress  HEENT: normal  Neck: no JVD, carotid bruits, or masses Cardiac: iRRR; no murmurs, rubs, or gallops,no edema  Respiratory:  clear to auscultation bilaterally, normal work of breathing GI: soft, nontender, nondistended, + BS MS: no deformity or atrophy  Skin: warm and dry Neuro:  Strength and sensation are intact Psych: euthymic mood, full affect  EKG:  EKG is ordered today. Personal review of the ekg ordered shows atrial fibrillation, rate 75  Recent Labs: 10/17/2016: HGB 13.9 02/27/2017: ALT 17; BUN 23; Creatinine 1.37; Platelet Count 252; Potassium 4.6; Sodium 142    Lipid Panel  No results found for: CHOL, TRIG, HDL, CHOLHDL, VLDL, LDLCALC, LDLDIRECT   Wt Readings from Last 3 Encounters:  03/21/17 114 lb (51.7 kg)  02/27/17 114 lb 8 oz (51.9 kg)  01/09/17 114 lb 3.2 oz (51.8 kg)  Other studies Reviewed: Additional studies/ records that were reviewed today include: TTE 06/16/15  Review of the above records today demonstrates:   Normal left ventricular systolic function, ejection fraction > 30%  Diastolic dysfunction - grade I (normal filling pressures)  Dilated left atrium - mild  Degenerative mitral valve disease  Tricuspid regurgitation - mild  Pulmonic regurgitation - mild  Normal right ventricular systolic function  Dilated right atrium - mild  Cardiac cath 06/15/15 1. No angiographically apparent  coronary artery disease 2. Normal LV filling pressures  Holter 02/09/17 - personally reviewed Minimum HR: 48 BPM at 4:09:12 PM Maximum HR: 144 BPM at 9:53:16 PM(2) Average HR: 75 BPM <1% PVCs Predominant rhythm atrial fibrillation  ASSESSMENT AND PLAN:  1.  Persistent atrial fibrillation: Currently on Eliquis and Multitak.  She has been in atrial fibrillation persistently.  She is having minimal symptoms.  We Nickole Adamek therefore plan for a rate control strategy.  We Jennie Bolar stop Multitak today and start diltiazem 120 mg.  This patients CHA2DS2-VASc Score and unadjusted Ischemic Stroke Rate (% per year) is equal to 3.2 % stroke rate/year from a score of 3  Above score calculated as 1 point each if present [CHF, HTN, DM, Vascular=MI/PAD/Aortic Plaque, Age if 65-74, or Female] Above score calculated as 2 points each if present [Age > 75, or Stroke/TIA/TE]   2. Stress induced myopathy: EF normalized.  No changes.  Current medicines are reviewed at length with the patient today.   The patient does not have concerns regarding her medicines.  The following changes were made today: Stop Multitak, start diltiazem  Labs/ tests ordered today include:  Orders Placed This Encounter  Procedures  . EKG 12-Lead     Disposition:   FU with Francy Mcilvaine 6 month  Signed, Serene Kopf Meredith Leeds, MD  03/21/2017 2:23 PM     [place] [place] [address] [phone] (office) [phone] (fax)

## 2017-03-21 NOTE — Addendum Note (Signed)
Addended by: Stanton Kidney on: 03/21/2017 02:26 PM   Modules accepted: Orders

## 2017-03-21 NOTE — Patient Instructions (Addendum)
Medication Instructions:  Your physician has recommended you make the following change in your medication:  1. STOP Multaq 2. START Diltiazem 120 mg once daily  * If you need a refill on your cardiac medications before your next appointment, please call your pharmacy. *  Labwork: None ordered  Testing/Procedures: None ordered  Follow-Up: Your physician wants you to follow-up in: 6 months with Dr. Curt Bears.  You will receive a reminder letter in the mail two months in advance. If you don't receive a letter, please call our office to schedule the follow-up appointment.  Thank you for choosing [facility]!!   Trinidad Curet, RN [phone]  Any Other Special Instructions Will Be Listed Below (If Applicable).  Diltiazem extended-release capsules or tablets What is this medicine? DILTIAZEM (dil TYE a zem) is a calcium-channel blocker. It affects the amount of calcium found in your heart and muscle cells. This relaxes your blood vessels, which can reduce the amount of work the heart has to do. This medicine is used to treat high blood pressure and chest pain caused by angina. This medicine may be used for other purposes; ask your health care provider or pharmacist if you have questions. COMMON BRAND NAME(S): Cardizem CD, Cardizem LA, Cardizem SR, Cartia XT, Dilacor XR, Dilt-CD, Diltia XT, Diltzac, Matzim LA, Rema Fendt, Tiamate, Tiazac What should I tell my health care provider before I take this medicine? They need to know if you have any of these conditions: -heart problems, low blood pressure, irregular heartbeat -liver disease -previous heart attack -an unusual or allergic reaction to diltiazem, other medicines, foods, dyes, or preservatives -pregnant or trying to get pregnant -breast-feeding How should I use this medicine? Take this medicine by mouth with a glass of water. Follow the directions on the prescription label. Swallow whole, do not crush or chew. Ask your doctor or  pharmacist if your should take this medicine with food. Take your doses at regular intervals. Do not take your medicine more often then directed. Do not stop taking except on the advice of your doctor or health care professional. Ask your doctor or health care professional how to gradually reduce the dose. Talk to your pediatrician regarding the use of this medicine in children. Special care may be needed. Overdosage: If you think you have taken too much of this medicine contact a poison control center or emergency room at once. NOTE: This medicine is only for you. Do not share this medicine with others. What if I miss a dose? If you miss a dose, take it as soon as you can. If it is almost time for your next dose, take only that dose. Do not take double or extra doses. What may interact with this medicine? Do not take this medicine with any of the following medications: -cisapride -hawthorn -pimozide -ranolazine -red yeast rice This medicine may also interact with the following medications: -buspirone -carbamazepine -cimetidine -cyclosporine -digoxin -local anesthetics or general anesthetics -lovastatin -medicines for anxiety or difficulty sleeping like midazolam and triazolam -medicines for high blood pressure or heart problems -quinidine -rifampin, rifabutin, or rifapentine This list may not describe all possible interactions. Give your health care provider a list of all the medicines, herbs, non-prescription drugs, or dietary supplements you use. Also tell them if you smoke, drink alcohol, or use illegal drugs. Some items may interact with your medicine. What should I watch for while using this medicine? Check your blood pressure and pulse rate regularly. Ask your doctor or health care professional what  your blood pressure and pulse rate should be and when you should contact him or her. You may feel dizzy or lightheaded. Do not drive, use machinery, or do anything that needs mental  alertness until you know how this medicine affects you. To reduce the risk of dizzy or fainting spells, do not sit or stand up quickly, especially if you are an older patient. Alcohol can make you more dizzy or increase flushing and rapid heartbeats. Avoid alcoholic drinks. What side effects may I notice from receiving this medicine? Side effects that you should report to your doctor or health care professional as soon as possible: -allergic reactions like skin rash, itching or hives, swelling of the face, lips, or tongue -confusion, mental depression -feeling faint or lightheaded, falls -redness, blistering, peeling or loosening of the skin, including inside the mouth -slow, irregular heartbeat -swelling of the feet and ankles -unusual bleeding or bruising, pinpoint red spots on the skin Side effects that usually do not require medical attention (report to your doctor or health care professional if they continue or are bothersome): -constipation or diarrhea -difficulty sleeping -facial flushing -headache -nausea, vomiting -sexual dysfunction -weak or tired This list may not describe all possible side effects. Call your doctor for medical advice about side effects. You may report side effects to FDA at 1-800-FDA-1088. Where should I keep my medicine? Keep out of the reach of children. Store at room temperature between 15 and 30 degrees C (59 and 86 degrees F). Protect from humidity. Throw away any unused medicine after the expiration date. NOTE: This sheet is a summary. It may not cover all possible information. If you have questions about this medicine, talk to your doctor, pharmacist, or health care provider.  2018 Elsevier/Gold Standard (2007-05-23 14:35:47)

## 2017-03-28 DIAGNOSIS — D044 Carcinoma in situ of skin of scalp and neck: Secondary | ICD-10-CM | POA: Insufficient documentation

## 2017-04-18 ENCOUNTER — Ambulatory Visit: Payer: Medicare Other | Admitting: Cardiology

## 2017-06-26 ENCOUNTER — Ambulatory Visit: Payer: Medicare Other | Admitting: Hematology & Oncology

## 2017-06-26 ENCOUNTER — Other Ambulatory Visit: Payer: Medicare Other

## 2017-07-04 DIAGNOSIS — D6869 Other thrombophilia: Secondary | ICD-10-CM | POA: Insufficient documentation

## 2017-07-16 ENCOUNTER — Ambulatory Visit: Payer: Medicare Other | Admitting: Cardiology

## 2017-07-18 ENCOUNTER — Encounter: Payer: Self-pay | Admitting: Cardiology

## 2017-07-18 ENCOUNTER — Encounter: Payer: Self-pay | Admitting: Family

## 2017-07-18 ENCOUNTER — Ambulatory Visit: Payer: Medicare Other | Admitting: Cardiology

## 2017-07-18 ENCOUNTER — Inpatient Hospital Stay: Payer: Medicare Other | Attending: Hematology & Oncology

## 2017-07-18 ENCOUNTER — Inpatient Hospital Stay: Payer: Medicare Other | Admitting: Family

## 2017-07-18 ENCOUNTER — Other Ambulatory Visit: Payer: Self-pay

## 2017-07-18 VITALS — BP 98/66 | HR 78 | Ht 61.5 in | Wt 114.0 lb

## 2017-07-18 VITALS — BP 113/65 | HR 84 | Temp 97.7°F | Resp 20 | Wt 114.4 lb

## 2017-07-18 DIAGNOSIS — I5181 Takotsubo syndrome: Secondary | ICD-10-CM

## 2017-07-18 DIAGNOSIS — C8583 Other specified types of non-Hodgkin lymphoma, intra-abdominal lymph nodes: Secondary | ICD-10-CM

## 2017-07-18 DIAGNOSIS — I481 Persistent atrial fibrillation: Secondary | ICD-10-CM | POA: Diagnosis not present

## 2017-07-18 DIAGNOSIS — R5383 Other fatigue: Secondary | ICD-10-CM | POA: Diagnosis not present

## 2017-07-18 DIAGNOSIS — C8300 Small cell B-cell lymphoma, unspecified site: Secondary | ICD-10-CM

## 2017-07-18 DIAGNOSIS — Z79899 Other long term (current) drug therapy: Secondary | ICD-10-CM

## 2017-07-18 DIAGNOSIS — R42 Dizziness and giddiness: Secondary | ICD-10-CM

## 2017-07-18 DIAGNOSIS — D508 Other iron deficiency anemias: Secondary | ICD-10-CM

## 2017-07-18 DIAGNOSIS — I4819 Other persistent atrial fibrillation: Secondary | ICD-10-CM

## 2017-07-18 LAB — CBC WITH DIFFERENTIAL (CANCER CENTER ONLY)
Basophils Absolute: 0.1 10*3/uL (ref 0.0–0.1)
Basophils Relative: 1 %
EOS ABS: 0.2 10*3/uL (ref 0.0–0.5)
EOS PCT: 3 %
HCT: 44 % (ref 34.8–46.6)
Hemoglobin: 14.5 g/dL (ref 11.6–15.9)
LYMPHS ABS: 0.8 10*3/uL — AB (ref 0.9–3.3)
LYMPHS PCT: 14 %
MCH: 31.1 pg (ref 26.0–34.0)
MCHC: 33 g/dL (ref 32.0–36.0)
MCV: 94.4 fL (ref 81.0–101.0)
MONO ABS: 0.3 10*3/uL (ref 0.1–0.9)
Monocytes Relative: 5 %
Neutro Abs: 4.6 10*3/uL (ref 1.5–6.5)
Neutrophils Relative %: 77 %
Platelet Count: 242 10*3/uL (ref 145–400)
RBC: 4.66 MIL/uL (ref 3.70–5.32)
RDW: 13.7 % (ref 11.1–15.7)
WBC Count: 6 10*3/uL (ref 3.9–10.0)

## 2017-07-18 LAB — CMP (CANCER CENTER ONLY)
ALT: 7 U/L (ref 0–55)
ANION GAP: 10 (ref 3–11)
AST: 28 U/L (ref 5–34)
Albumin: 4.2 g/dL (ref 3.5–5.0)
Alkaline Phosphatase: 127 U/L (ref 40–150)
BUN: 21 mg/dL (ref 7–26)
CHLORIDE: 102 mmol/L (ref 98–109)
CO2: 28 mmol/L (ref 22–29)
CREATININE: 1.24 mg/dL — AB (ref 0.60–1.10)
Calcium: 9.7 mg/dL (ref 8.4–10.4)
GFR, EST AFRICAN AMERICAN: 44 mL/min — AB (ref >60)
GFR, EST NON AFRICAN AMERICAN: 38 mL/min — AB (ref >60)
Glucose, Bld: 169 mg/dL — ABNORMAL HIGH (ref 70–140)
POTASSIUM: 4.5 mmol/L (ref 3.5–5.1)
SODIUM: 140 mmol/L (ref 136–145)
Total Bilirubin: 0.7 mg/dL (ref 0.2–1.2)
Total Protein: 7 g/dL (ref 6.4–8.3)

## 2017-07-18 LAB — TECHNOLOGIST SMEAR REVIEW

## 2017-07-18 NOTE — Progress Notes (Signed)
Electrophysiology Office Note   Date:  07/18/2017   ID:  Kathy Massey, DOB November 23, 1927, MRN [id number]  PCP:  Kathy Massey., MD  Cardiologist:   Primary Electrophysiologist:  Kathy Widener Meredith Leeds, MD    Chief Complaint  Patient presents with  . Follow-up    Persistent Afib  . Dizziness     History of Present Illness: Kathy Massey is a 84 y.o. female who is being seen today for the evaluation of atrial fibrillation at the request of Kathy Massey.,*. Presenting today for electrophysiology evaluation. She has a history of paroxysmal atrial fibrillation on Eliquis as well as stress-induced cardiomyopathy.  Her last visit, she was persistently in atrial fibrillation.  A rate control strategy was adopted.     Today, denies symptoms of palpitations, chest pain, shortness of breath, orthopnea, PND, lower extremity edema, claudication, presyncope, syncope, bleeding, or neurologic sequela. The patient is tolerating medications without difficulties.  Her main complaint today is of dizziness.  It occurs mainly in the mornings when she wakes up.  It lasts a few hours.  It does not occur on a daily basis.  It is not associated with room spinning or changing position.  Past Medical History:  Diagnosis Date  . 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   Current Outpatient Medications  Medication Sig Dispense Refill  . apixaban (ELIQUIS) 2.5 MG TABS tablet Take 2.5 mg by mouth 2 (two) times daily.     . clindamycin (CLEOCIN T) 1 % lotion APPLY TOPICALLY 2 (TWO) TIMES DAILY. 60 mL 1  . diltiazem (CARDIZEM CD) 120 MG 24 hr capsule Take 1 capsule (120 mg total) by mouth daily. 30 capsule 6  . mirtazapine (REMERON) 30 MG tablet Take 0.5 tablets (15 mg total) by mouth at bedtime as needed. 5 tablet 0  . Multiple Vitamin (THERA) TABS Take 1 tablet by mouth every morning.     No current facility-administered medications  for this visit.     Allergies:   Patient has no known allergies.   Social History:  The patient  reports that she has never smoked. She has never used smokeless tobacco. She reports that she does not drink alcohol or use drugs.   Family History:  The patient's family history includes Heart disease in her father and mother.   ROS:  Please see the history of present illness.   Otherwise, review of systems is positive for none.   All other systems are reviewed and negative.   PHYSICAL EXAM: VS:  BP 98/66   Pulse 78   Ht 5' 1.5" (1.562 m)   Wt 114 lb (51.7 kg)   SpO2 96%   BMI 21.19 kg/m  , BMI Body mass index is 21.19 kg/m. GEN: Well nourished, well developed, in no acute distress  HEENT: normal  Neck: no JVD, carotid bruits, or masses Cardiac: RRR; no murmurs, rubs, or gallops,no edema  Respiratory:  clear to auscultation bilaterally, normal work of breathing GI: soft, nontender, nondistended, + BS MS: no deformity or atrophy  Skin: warm and dry Neuro:  Strength and sensation are intact Psych: euthymic mood, full affect  EKG:  EKG is ordered today. Personal review of the ekg ordered shows atrial fibrillation, rate 78  Recent Labs: 02/27/2017: ALT 17; BUN 23; Creatinine 1.37; Hemoglobin 13.8; Platelet Count 252; Potassium 4.6; Sodium 142    Lipid Panel  No results found for: CHOL, TRIG, HDL, CHOLHDL, VLDL,  [place], LDLDIRECT   Wt Readings from Last 3 Encounters:  07/18/17 114 lb (51.7 kg)  03/21/17 114 lb (51.7 kg)  02/27/17 114 lb 8 oz (51.9 kg)      Other studies Reviewed: Additional studies/ records that were reviewed today include: TTE 06/16/15  Review of the above records today demonstrates:   Normal left ventricular systolic function, ejection fraction > 41%  Diastolic dysfunction - grade I (normal filling pressures)  Dilated left atrium - mild  Degenerative mitral valve disease  Tricuspid regurgitation - mild  Pulmonic regurgitation - mild  Normal  right ventricular systolic function  Dilated right atrium - mild  Cardiac cath 06/15/15 1. No angiographically apparent coronary artery disease 2. Normal LV filling pressures  Holter 02/09/17 - personally reviewed Minimum HR: 48 BPM at 4:09:12 PM Maximum HR: 144 BPM at 9:53:16 PM(2) Average HR: 75 BPM <1% PVCs Predominant rhythm atrial fibrillation  ASSESSMENT AND PLAN:  1.  Persistent atrial fibrillation: Currently on Eliquis.  We have decided on a rate control strategy with diltiazem 120 mg.  She does present complaining of dizziness.  The dizziness is not associated with any positional change.  She is not feeling like she is going to blackout.  Her blood pressure is mildly low, but it is not to the point where I would expect this level of dizziness to occur.  I Kathy Massey continue her diltiazem.  She has plans to meet with a primary physician coming up.  We Kathy Massey await that discussion.  I did tell them that if she continues to have dizziness, weakness, or fatigue, she would potentially benefit from cardioversion.  This patients CHA2DS2-VASc Score and unadjusted Ischemic Stroke Rate (% per year) is equal to 3.2 % stroke rate/year from a score of 3  Above score calculated as 1 point each if present [CHF, HTN, DM, Vascular=MI/PAD/Aortic Plaque, Age if 65-74, or Female] Above score calculated as 2 points each if present [Age > 75, or Stroke/TIA/TE]   2. Stress induced myopathy: EF normalized.  No changes.  Current medicines are reviewed at length with the patient today.   The patient does not have concerns regarding her medicines.  The following changes were made today: None  Labs/ tests ordered today include:  Orders Placed This Encounter  Procedures  . EKG 12-Lead     Disposition:   FU with Kathy Massey 3 month  Signed, Kathy Christopher Meredith Leeds, MD  07/18/2017 11:41 AM     [facility] [address] [place] [address] [phone] (office) [phone]  (fax)

## 2017-07-18 NOTE — Progress Notes (Signed)
Hematology and Oncology Follow Up Visit  Kathy Massey [id number] [birth year] 84 y.o. 07/18/2017   Principle Diagnosis:  Marginal zone lymphoma  Past Therapy:   Status post cycle 6 of Rituxan/Treanda Maintenance Rituxan every 3 months - completed February 2018  Current Therapy: Observation   Interim History:  Kathy Massey is here today with her daughter for follow-up. She is doing fairly well but is still having fatigue at times and episodes of dizziness. This typically occurs in the morning and sometimes lasts into the afternoon. She saw her cardiologist this morning for a routine follow-up.  She has had no syncopal episodes or falls.  No lymphadenopathy noted on exam.  No fever, chills, n/v, cough, rash, dizziness, chest pain, palpitations, abdominal pain or changes in bowel or bladder habits.  She has SOB with over exertion due to COPD.  She has constipation off and on. She will try Mirilax as needed.  No episodes of bleeding, no bruising or petechiae.  No swelling, tenderness, numbness or tingling in her extremities. No c/o pain.  She has a good appetite and is staying well hydrated. Her weight is stable.   ECOG Performance Status: 1 - Symptomatic but completely ambulatory  Medications:  Allergies as of 07/18/2017   No Known Allergies     Medication List        Accurate as of 07/18/17  2:42 PM. Always use your most recent med list.          clindamycin 1 % lotion Commonly known as:  CLEOCIN T APPLY TOPICALLY 2 (TWO) TIMES DAILY.   diltiazem 120 MG 24 hr capsule Commonly known as:  CARDIZEM CD Take 1 capsule (120 mg total) by mouth daily.   ELIQUIS 2.5 MG Tabs tablet Generic drug:  apixaban Take 2.5 mg by mouth 2 (two) times daily.   mirtazapine 30 MG tablet Commonly known as:  REMERON Take 0.5 tablets (15 mg total) by mouth at bedtime as needed.   THERA Tabs Take 1 tablet by mouth every morning.       Allergies: No Known Allergies  Past Medical History,  Surgical history, Social history, and Family History were reviewed and updated.  Review of Systems: All other 10 point review of systems is negative.   Physical Exam:  weight is 114 lb 6.4 oz (51.9 kg). Her oral temperature is 97.7 F (36.5 C). Her blood pressure is 113/65 and her pulse is 84. Her respiration is 20 and oxygen saturation is 97%.   Wt Readings from Last 3 Encounters:  07/18/17 114 lb 6.4 oz (51.9 kg)  07/18/17 114 lb (51.7 kg)  03/21/17 114 lb (51.7 kg)    Ocular: Sclerae unicteric, pupils equal, round and reactive to light Ear-nose-throat: Oropharynx clear, dentition fair Lymphatic: No cervical, supraclavicular or axillary adenopathy Lungs no rales or rhonchi, good excursion bilaterally Heart regular rate and rhythm, no murmur appreciated Abd soft, nontender, positive bowel sounds, no liver or spleen tip palpated on exam, no fluid wave  MSK no focal spinal tenderness, no joint edema Neuro: non-focal, well-oriented, appropriate affect Breasts: Deferred   Lab Results  Component Value Date   WBC 6.0 07/18/2017   HGB 14.5 07/18/2017   HCT 44.0 07/18/2017   MCV 94.4 07/18/2017   PLT 242 07/18/2017   No results found for: FERRITIN, IRON, TIBC, UIBC, IRONPCTSAT Lab Results  Component Value Date   RBC 4.66 07/18/2017   No results found for: KPAFRELGTCHN, LAMBDASER, KAPLAMBRATIO No results found for: IGGSERUM, IGA, IGMSERUM No results  found for: Odetta Pink, SPEI   Chemistry      Component Value Date/Time   NA 142 02/27/2017 1148   NA 144 10/17/2016 1000   K 4.6 02/27/2017 1148   K 4.5 10/17/2016 1000   CL 108 02/27/2017 1148   CL 106 10/17/2016 1000   CO2 27 02/27/2017 1148   CO2 30 10/17/2016 1000   BUN 23 02/27/2017 1148   BUN 18 10/17/2016 1000   CREATININE 1.37 (H) 02/27/2017 1148   CREATININE 1.6 (H) 10/17/2016 1000      Component Value Date/Time   CALCIUM 9.6 02/27/2017 1148   CALCIUM 9.4  10/17/2016 1000   ALKPHOS 127 02/27/2017 1148   ALKPHOS 101 (H) 10/17/2016 1000   AST 30 02/27/2017 1148   ALT 17 02/27/2017 1148   ALT 21 10/17/2016 1000   BILITOT 0.7 02/27/2017 1148      Impression and Plan: Kathy Massey is a very pleasant 84 yo caucasian female with marginal zone lymphoma. She is doing well but still having episodes of fatigue and dizziness.  Her CBC and CMP are stable.  We will see what her iron studies show and bring her back in for an infusion if needed.  We will plan to see her back in another 4 months for follow-up.  They will contact our office with any questions or concerns. We can certainly see her sooner if need be.   [name], NP 6/5/20192:42 PM

## 2017-07-18 NOTE — Patient Instructions (Signed)
Medication Instructions:  Your physician recommends that you continue on your current medications as directed. Please refer to the Current Medication list given to you today.  Labwork: None ordered  Testing/Procedures: None ordered  Follow-Up: Your physician recommends that you schedule a follow-up appointment in: 3 months with Dr. Camnitz.  * If you need a refill on your cardiac medications before your next appointment, please call your pharmacy.   *Please note that any paperwork needing to be filled out by the provider will need to be addressed at the front desk prior to seeing the provider. Please note that any FMLA, disability or other documents regarding health condition is subject to a $25.00 charge that must be received prior to completion of paperwork in the form of a money order or check.  Thank you for choosing [facility]!!   Aviraj Kentner, RN [phone]        

## 2017-07-19 ENCOUNTER — Ambulatory Visit: Payer: Medicare Other | Admitting: Hematology & Oncology

## 2017-07-19 ENCOUNTER — Other Ambulatory Visit: Payer: Medicare Other

## 2017-07-19 LAB — FERRITIN: Ferritin: 50 ng/mL (ref 9–269)

## 2017-07-19 LAB — IRON AND TIBC
Iron: 156 ug/dL — ABNORMAL HIGH (ref 41–142)
Saturation Ratios: 48 % (ref 21–57)
TIBC: 328 ug/dL (ref 236–444)
UIBC: 172 ug/dL

## 2017-07-20 DIAGNOSIS — N1831 Chronic kidney disease, stage 3a: Secondary | ICD-10-CM | POA: Insufficient documentation

## 2017-07-20 DIAGNOSIS — N183 Chronic kidney disease, stage 3 (moderate): Secondary | ICD-10-CM

## 2017-07-20 DIAGNOSIS — R42 Dizziness and giddiness: Secondary | ICD-10-CM | POA: Insufficient documentation

## 2017-07-20 DIAGNOSIS — Z7901 Long term (current) use of anticoagulants: Secondary | ICD-10-CM | POA: Insufficient documentation

## 2017-10-17 ENCOUNTER — Ambulatory Visit: Payer: Medicare Other | Admitting: Cardiology

## 2017-10-25 ENCOUNTER — Telehealth (HOSPITAL_COMMUNITY): Payer: Self-pay | Admitting: *Deleted

## 2017-10-25 NOTE — Telephone Encounter (Signed)
Patient's daughter called in concerned her mother was not feeling well today. Pt endorses dizziness and some weakness. Her HR is in the 70s BP 130/73 her oxygen level was in the low 90s. Denies fever or shortness of breath. Daughter states she hooked her mother up to apple watch ekg confirmed afib. Explained to daughter per Dr. Curt Bears last note pt was living in AF rate controlled and weakness/dizziness were symptoms at that visit as well. If weakness is exaggerated today make sure well hydrated if symptoms persistent encouraged PCP visit to ensure no UTI or other infection. Offered appt for early next week if symptoms persist but daughter was going to be out of town starting tomorrow. Encouraged daughter to take a log of her symptoms/HR/BP to upcoming appt with Dr. Curt Bears on the 95th. Daughter verbalizes understanding.

## 2017-11-07 ENCOUNTER — Encounter: Payer: Self-pay | Admitting: Cardiology

## 2017-11-07 ENCOUNTER — Ambulatory Visit: Payer: Medicare Other | Admitting: Cardiology

## 2017-11-07 VITALS — BP 112/62 | HR 93 | Ht 61.5 in | Wt 120.0 lb

## 2017-11-07 DIAGNOSIS — I482 Chronic atrial fibrillation: Secondary | ICD-10-CM

## 2017-11-07 DIAGNOSIS — I4821 Permanent atrial fibrillation: Secondary | ICD-10-CM

## 2017-11-07 NOTE — Patient Instructions (Signed)
Medication Instructions:  Your physician recommends that you continue on your current medications as directed. Please refer to the Current Medication list given to you today.  * If you need a refill on your cardiac medications before your next appointment, please call your pharmacy.   Labwork: None ordered  Testing/Procedures: None ordered  Follow-Up: Your physician wants you to follow-up in: 6 months with Dr. Curt Bears in [facility].  You will receive a reminder letter in the mail two months in advance. If you don't receive a letter, please call our office to schedule the follow-up appointment.  *Please note that any paperwork needing to be filled out by the provider will need to be addressed at the front desk prior to seeing the provider. Please note that any FMLA, disability or other documents regarding health condition is subject to a $25.00 charge that must be received prior to completion of paperwork in the form of a money order or check.  Thank you for choosing [facility]!!   Trinidad Curet, RN [phone]

## 2017-11-07 NOTE — Progress Notes (Signed)
Electrophysiology Office Note   Date:  11/07/2017   ID:  Kathy Massey, DOB October 04, 1927, MRN [id number]  PCP:  [name]., MD  Cardiologist:   Primary Electrophysiologist:  Kathy Fors Meredith Leeds, MD    No chief complaint on file.    History of Present Illness: Kathy Massey is a 84 y.o. female who is being seen today for the evaluation of atrial fibrillation at the request of [name].,*. Presenting today for electrophysiology evaluation. She has a history of paroxysmal atrial fibrillation on Eliquis as well as stress-induced cardiomyopathy.  Her last visit, she was persistently in atrial fibrillation.  A rate control strategy was adopted.     Today, denies symptoms of palpitations, chest pain, shortness of breath, orthopnea, PND, lower extremity edema, claudication, dizziness, presyncope, syncope, bleeding, or neurologic sequela. The patient is tolerating medications without difficulties.  Overall she is feeling well.  She has no complaints today.  2 to 3 weeks ago she had an episode where she was quite dizzy.  She had her heart rate checked and it was persistently in the high 40s to 50s.  It would at times go into the 90s.  This lasted for 4 to 5 days.  Past Medical History:  Diagnosis Date  . 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   Current Outpatient Medications  Medication Sig Dispense Refill  . apixaban (ELIQUIS) 2.5 MG TABS tablet Take 2.5 mg by mouth 2 (two) times daily.     Marland Kitchen diltiazem (CARDIZEM SR) 60 MG 12 hr capsule Take 60 mg by mouth 2 (two) times daily.  3  . mirtazapine (REMERON) 30 MG tablet Take 0.5 tablets (15 mg total) by mouth at bedtime as needed. 5 tablet 0  . Multiple Vitamin (THERA) TABS Take 1 tablet by mouth every morning.     No current facility-administered medications for this visit.     Allergies:   Patient has no known allergies.   Social History:  The  patient  reports that she has never smoked. She has never used smokeless tobacco. She reports that she does not drink alcohol or use drugs.   Family History:  The patient's family history includes Heart disease in her father and mother.   ROS:  Please see the history of present illness.   Otherwise, review of systems is positive for none.   All other systems are reviewed and negative.   PHYSICAL EXAM: VS:  BP 112/62   Pulse 93   Ht 5' 1.5" (1.562 m)   Wt 120 lb (54.4 kg)   SpO2 96%   BMI 22.31 kg/m  , BMI Body mass index is 22.31 kg/m. GEN: Well nourished, well developed, in no acute distress  HEENT: normal  Neck: no JVD, carotid bruits, or masses Cardiac: iRRR; no murmurs, rubs, or gallops,no edema  Respiratory:  clear to auscultation bilaterally, normal work of breathing GI: soft, nontender, nondistended, + BS MS: no deformity or atrophy  Skin: warm and dry Neuro:  Strength and sensation are intact Psych: euthymic mood, full affect  EKG:  EKG is not ordered today. Personal review of the ekg ordered 07/18/17 shows AF, rate 78   Recent Labs: 07/18/2017: ALT 7; BUN 21; Creatinine 1.24; Hemoglobin 14.5; Platelet Count 242; Potassium 4.5; Sodium 140    Lipid Panel  No results found for: CHOL, TRIG, HDL, CHOLHDL, VLDL, LDLCALC, LDLDIRECT   Wt Readings from Last 3 Encounters:  11/07/17  120 lb (54.4 kg)  07/18/17 114 lb 6.4 oz (51.9 kg)  07/18/17 114 lb (51.7 kg)      Other studies Reviewed: Additional studies/ records that were reviewed today include: TTE 06/16/15  Review of the above records today demonstrates:   Normal left ventricular systolic function, ejection fraction > 07%  Diastolic dysfunction - grade I (normal filling pressures)  Dilated left atrium - mild  Degenerative mitral valve disease  Tricuspid regurgitation - mild  Pulmonic regurgitation - mild  Normal right ventricular systolic function  Dilated right atrium - mild  Cardiac cath 06/15/15 1. No  angiographically apparent coronary artery disease 2. Normal LV filling pressures  Holter 02/09/17 - personally reviewed Minimum HR: 48 BPM at 4:09:12 PM Maximum HR: 144 BPM at 9:53:16 PM(2) Average HR: 75 BPM <1% PVCs Predominant rhythm atrial fibrillation  ASSESSMENT AND PLAN:  1.  Persistent atrial fibrillation: Eliquis and diltiazem.  Have elected for a rate control strategy.  She is feeling well today.  A few weeks ago she did have an episode of atrial fibrillation with either fast or slow rates that made her feel weak and dizzy.  This lasted 5 days.  I told her to get in touch with [place] if this happens again.  No changes.  This patients CHA2DS2-VASc Score and unadjusted Ischemic Stroke Rate (% per year) is equal to 3.2 % stroke rate/year from a score of 3  Above score calculated as 1 point each if present [CHF, HTN, DM, Vascular=MI/PAD/Aortic Plaque, Age if 65-74, or Female] Above score calculated as 2 points each if present [Age > 75, or Stroke/TIA/TE]   2. Stress induced myopathy: Ejection fraction normalized.  No changes.  Current medicines are reviewed at length with the patient today.   The patient does not have concerns regarding her medicines.  The following changes were made today: None  Labs/ tests ordered today include:  No orders of the defined types were placed in this encounter.    Disposition:   FU with Marsheila Alejo 6 month  Signed, Carol Theys Meredith Leeds, MD  11/07/2017 2:24 PM     [facility] [address] [place] [address] [phone] (office) [phone] (fax)

## 2017-11-21 ENCOUNTER — Other Ambulatory Visit: Payer: Medicare Other

## 2017-11-21 ENCOUNTER — Ambulatory Visit: Payer: Medicare Other | Admitting: Family

## 2017-11-28 ENCOUNTER — Inpatient Hospital Stay (HOSPITAL_BASED_OUTPATIENT_CLINIC_OR_DEPARTMENT_OTHER): Payer: Medicare Other | Admitting: Family

## 2017-11-28 ENCOUNTER — Other Ambulatory Visit: Payer: Self-pay

## 2017-11-28 ENCOUNTER — Inpatient Hospital Stay: Payer: Medicare Other | Attending: Hematology & Oncology

## 2017-11-28 VITALS — BP 111/72 | HR 77 | Temp 97.6°F | Resp 20 | Wt 120.0 lb

## 2017-11-28 DIAGNOSIS — D508 Other iron deficiency anemias: Secondary | ICD-10-CM

## 2017-11-28 DIAGNOSIS — R350 Frequency of micturition: Secondary | ICD-10-CM | POA: Diagnosis not present

## 2017-11-28 DIAGNOSIS — C8583 Other specified types of non-Hodgkin lymphoma, intra-abdominal lymph nodes: Secondary | ICD-10-CM

## 2017-11-28 DIAGNOSIS — R140 Abdominal distension (gaseous): Secondary | ICD-10-CM

## 2017-11-28 DIAGNOSIS — Z79899 Other long term (current) drug therapy: Secondary | ICD-10-CM | POA: Insufficient documentation

## 2017-11-28 DIAGNOSIS — R635 Abnormal weight gain: Secondary | ICD-10-CM | POA: Diagnosis not present

## 2017-11-28 DIAGNOSIS — Z7901 Long term (current) use of anticoagulants: Secondary | ICD-10-CM | POA: Diagnosis not present

## 2017-11-28 LAB — CBC WITH DIFFERENTIAL (CANCER CENTER ONLY)
ABS IMMATURE GRANULOCYTES: 0.02 10*3/uL (ref 0.00–0.07)
BASOS PCT: 1 %
Basophils Absolute: 0.1 10*3/uL (ref 0.0–0.1)
Eosinophils Absolute: 0.3 10*3/uL (ref 0.0–0.5)
Eosinophils Relative: 5 %
HEMATOCRIT: 45.5 % (ref 36.0–46.0)
Hemoglobin: 14.1 g/dL (ref 12.0–15.0)
IMMATURE GRANULOCYTES: 0 %
LYMPHS ABS: 0.9 10*3/uL (ref 0.7–4.0)
Lymphocytes Relative: 17 %
MCH: 29.1 pg (ref 26.0–34.0)
MCHC: 31 g/dL (ref 30.0–36.0)
MCV: 94 fL (ref 80.0–100.0)
MONO ABS: 0.4 10*3/uL (ref 0.1–1.0)
Monocytes Relative: 8 %
NEUTROS ABS: 3.9 10*3/uL (ref 1.7–7.7)
NEUTROS PCT: 69 %
Platelet Count: 252 10*3/uL (ref 150–400)
RBC: 4.84 MIL/uL (ref 3.87–5.11)
RDW: 13.5 % (ref 11.5–15.5)
WBC Count: 5.6 10*3/uL (ref 4.0–10.5)
nRBC: 0 % (ref 0.0–0.2)

## 2017-11-28 LAB — CMP (CANCER CENTER ONLY)
ALK PHOS: 138 U/L — AB (ref 26–84)
ALT: 13 U/L (ref 10–47)
ANION GAP: 2 — AB (ref 5–15)
AST: 35 U/L (ref 11–38)
Albumin: 3.8 g/dL (ref 3.5–5.0)
BILIRUBIN TOTAL: 0.9 mg/dL (ref 0.2–1.6)
BUN: 20 mg/dL (ref 7–22)
CALCIUM: 9.6 mg/dL (ref 8.0–10.3)
CO2: 31 mmol/L (ref 18–33)
Chloride: 110 mmol/L — ABNORMAL HIGH (ref 98–108)
Creatinine: 1.4 mg/dL — ABNORMAL HIGH (ref 0.60–1.20)
Glucose, Bld: 77 mg/dL (ref 73–118)
POTASSIUM: 5 mmol/L — AB (ref 3.3–4.7)
Sodium: 143 mmol/L (ref 128–145)
TOTAL PROTEIN: 6.8 g/dL (ref 6.4–8.1)

## 2017-11-28 LAB — SAVE SMEAR(SSMR), FOR PROVIDER SLIDE REVIEW

## 2017-11-28 NOTE — Progress Notes (Signed)
Hematology and Oncology Follow Up Visit  Kathy Massey [id number] [birth year] 84 y.o. 11/28/2017   Principle Diagnosis:  Marginal zone lymphoma  Past Therapy:             Status post cycle 6 of Rituxan/Treanda Maintenance Rituxan every 3 months - completed February 2018  Current Therapy:   Observation   Interim History:  Kathy Massey is here today for follow-up. She is doing fairly well but states that she has had some bloating and weight gain. Her weight is up 6 lbs since her last visit.  She also states that she is having urinary frequency. She denies urgency, pain or burning on urination. Her states that she had this same symptom when she was initially diagnosed with lymphoma.   No hot flashes or night sweats.  She has some puffiness in her lower extremities.  On exam, she had one palpable left inguinal lymph node. No other lymphadenopathy noted.   No numbness or tingling in her extremities.  She is staying active exercising and walking regularly.  No fever, chills, n/v, cough, rash, dizziness, SOB, chest pain, palpitations, abdominal pain or changes in bowel habits.  She states that she has diarrhea once ever week or so due to certain foods.  She has had no episodes of bleeding on Eliquis. She does bruise easily but not in excess.  She recently had some issues with atrial fib and hypotension leading to some dizziness. She is followed closely by cardiology.  She is eating well and is staying well hydrated.   ECOG Performance Status: 1 - Symptomatic but completely ambulatory  Medications:  Allergies as of 11/28/2017   No Known Allergies     Medication List        Accurate as of 11/28/17  2:10 PM. Always use your most recent med list.          diltiazem 60 MG 12 hr capsule Commonly known as:  CARDIZEM SR Take 60 mg by mouth 2 (two) times daily.   ELIQUIS 2.5 MG Tabs tablet Generic drug:  apixaban Take 2.5 mg by mouth 2 (two) times daily.   fluocinonide cream 0.05  % Commonly known as:  LIDEX APPLY A THIN LAYER TO THE AFFECTED AREA TWICE A DAY FOR 3 WEEKS, STOP 1 WEEK, REPEAT FOR FLARES.   mirtazapine 30 MG tablet Commonly known as:  REMERON Take 0.5 tablets (15 mg total) by mouth at bedtime as needed.   THERA Tabs Take 1 tablet by mouth every morning.       Allergies: No Known Allergies  Past Medical History, Surgical history, Social history, and Family History were reviewed and updated.  Review of Systems: All other 10 point review of systems is negative.   Physical Exam:  weight is 120 lb (54.4 kg). Her oral temperature is 97.6 F (36.4 C). Her blood pressure is 111/72 and her pulse is 77. Her respiration is 20 and oxygen saturation is 99%.   Wt Readings from Last 3 Encounters:  11/28/17 120 lb (54.4 kg)  11/07/17 120 lb (54.4 kg)  07/18/17 114 lb 6.4 oz (51.9 kg)    Ocular: Sclerae unicteric, pupils equal, round and reactive to light Ear-nose-throat: Oropharynx clear, dentition fair Lymphatic: No cervical, supraclavicular or axillary adenopathy Lungs no rales or rhonchi, good excursion bilaterally Heart regular rate and rhythm, no murmur appreciated Abd soft, nontender, positive bowel sounds, no liver or spleen tip palpated on exam, no fluid wave  MSK no focal spinal tenderness, no joint edema  Neuro: non-focal, well-oriented, appropriate affect Breasts: Deferred   Lab Results  Component Value Date   WBC 6.0 07/18/2017   HGB 14.5 07/18/2017   HCT 44.0 07/18/2017   MCV 94.4 07/18/2017   PLT 242 07/18/2017   Lab Results  Component Value Date   FERRITIN 50 07/18/2017   IRON 156 (H) 07/18/2017   TIBC 328 07/18/2017   UIBC 172 07/18/2017   IRONPCTSAT 48 07/18/2017   Lab Results  Component Value Date   RBC 4.66 07/18/2017   No results found for: KPAFRELGTCHN, LAMBDASER, KAPLAMBRATIO No results found for: IGGSERUM, IGA, IGMSERUM No results found for: Odetta Pink,  SPEI   Chemistry      Component Value Date/Time   NA 140 07/18/2017 1413   NA 144 10/17/2016 1000   K 4.5 07/18/2017 1413   K 4.5 10/17/2016 1000   CL 102 07/18/2017 1413   CL 106 10/17/2016 1000   CO2 28 07/18/2017 1413   CO2 30 10/17/2016 1000   BUN 21 07/18/2017 1413   BUN 18 10/17/2016 1000   CREATININE 1.24 (H) 07/18/2017 1413   CREATININE 1.6 (H) 10/17/2016 1000      Component Value Date/Time   CALCIUM 9.7 07/18/2017 1413   CALCIUM 9.4 10/17/2016 1000   ALKPHOS 127 07/18/2017 1413   ALKPHOS 101 (H) 10/17/2016 1000   AST 28 07/18/2017 1413   ALT 7 07/18/2017 1413   ALT 21 10/17/2016 1000   BILITOT 0.7 07/18/2017 1413      Impression and Plan: Kathy Massey is a very pleasant 84 yo caucasian female with history of marginal zone lymphoma. She is here today with several symptoms as mentioned above along with a palpable left inguinal lymph node.  We will get CT scans in the next few days to further evaluate.  CBC is stable. Iron studies are pending.   We will go ahead and plan to see her back in another 3 months.  They will contact our office with any questions or concerns. We can certainly see her sooner if need be.  [name], NP 10/16/20192:10 PM

## 2017-11-29 LAB — IRON AND TIBC
Iron: 143 ug/dL — ABNORMAL HIGH (ref 41–142)
Saturation Ratios: 43 % (ref 21–57)
TIBC: 331 ug/dL (ref 236–444)
UIBC: 188 ug/dL

## 2017-11-29 LAB — FERRITIN: Ferritin: 61 ng/mL (ref 11–307)

## 2017-12-04 ENCOUNTER — Ambulatory Visit (HOSPITAL_BASED_OUTPATIENT_CLINIC_OR_DEPARTMENT_OTHER)
Admission: RE | Admit: 2017-12-04 | Discharge: 2017-12-04 | Disposition: A | Discharge status: Home / Self Care | Payer: Medicare Other | Source: Ambulatory Visit | Attending: Family | Admitting: Family

## 2017-12-04 ENCOUNTER — Encounter (HOSPITAL_BASED_OUTPATIENT_CLINIC_OR_DEPARTMENT_OTHER): Payer: Self-pay

## 2017-12-04 DIAGNOSIS — C8583 Other specified types of non-Hodgkin lymphoma, intra-abdominal lymph nodes: Secondary | ICD-10-CM

## 2017-12-04 DIAGNOSIS — I700 Atherosclerosis of aorta: Secondary | ICD-10-CM | POA: Insufficient documentation

## 2017-12-04 DIAGNOSIS — I728 Aneurysm of other specified arteries: Secondary | ICD-10-CM | POA: Diagnosis not present

## 2017-12-04 DIAGNOSIS — R590 Localized enlarged lymph nodes: Secondary | ICD-10-CM | POA: Diagnosis not present

## 2017-12-04 MED ORDER — IOPAMIDOL (ISOVUE-300) INJECTION 61%
100.0000 mL | Freq: Once | INTRAVENOUS | Status: AC | PRN
  Administered 2017-12-04: 80 mL via INTRAVENOUS

## 2017-12-10 ENCOUNTER — Other Ambulatory Visit: Payer: Self-pay | Admitting: Family

## 2017-12-10 ENCOUNTER — Telehealth: Payer: Self-pay | Admitting: Family

## 2017-12-10 DIAGNOSIS — C8583 Other specified types of non-Hodgkin lymphoma, intra-abdominal lymph nodes: Secondary | ICD-10-CM

## 2017-12-10 NOTE — Telephone Encounter (Signed)
I spoke with Ms. Weiand's daughter, per her request, and went over her CT scan results in detail. All questions at this time were answered and she verbalized understanding. We will now get her set up for excision and biopsy of the axillary lymph node. She plans to call her mother today and relay this information. We will follow-up once we have results.

## 2017-12-12 ENCOUNTER — Other Ambulatory Visit: Payer: Self-pay | Admitting: Family

## 2017-12-12 DIAGNOSIS — C8583 Other specified types of non-Hodgkin lymphoma, intra-abdominal lymph nodes: Secondary | ICD-10-CM

## 2017-12-21 ENCOUNTER — Other Ambulatory Visit: Payer: Self-pay | Admitting: General Surgery

## 2017-12-24 ENCOUNTER — Telehealth: Payer: Self-pay | Admitting: *Deleted

## 2017-12-24 NOTE — Telephone Encounter (Signed)
Patient with diagnosis of atrial fibrillation on Eliquis for anticoagulation.    Procedure: excision of deep left inguinal lymph node Date of procedure: TBD  CHADS2-VASc score of  3 (, AGE, , AGE, female)  CrCl 23.9 Platelet count 252  Per office protocol, patient can hold Eliquis for 3 days prior to procedure.    Patient should restart Eliquis on the evening of procedure or day after, at discretion of procedure MD

## 2017-12-24 NOTE — Telephone Encounter (Signed)
   [facility] Pre-operative Risk Assessment    Request for surgical clearance:  1. What type of surgery is being performed? EXCISION OF DEEP LEFT INGUINAL LYMPH NODE   2. When is this surgery scheduled? TBD   3. Are there any medications that need to be held prior to surgery and how long? ELIQUIS   4. Practice name and name of physician performing surgery? [facility]; DR. Fanny Skates ; ATT: JACQUELINE HAGGETT, RMA   5. What is your office phone and fax number? PH# [phone]; FAX# [phone]   1. Anesthesia type (None, local, MAC, general) ? GENERAL   Julaine Hua 12/24/2017, 2:43 PM  _________________________________________________________________   (provider comments below)

## 2018-01-01 NOTE — Pre-Procedure Instructions (Signed)
Rhetta Cleek  01/01/2018      [place], [place] [address] Phone: [phone] Fax: [phone]  [id number] [address], [place] [address] Phone: [phone] Fax: [phone]    Your procedure is scheduled on Monday December 2.  Report to [facility] Admitting at 7:30 A.M.  Call this number if you have problems the morning of surgery:  [phone]   Remember:  Do not eat or drink after midnight.    Take these medicines the morning of surgery with A SIP OF WATER: Diltiazem (Cardizem)  7 days prior to surgery STOP taking any Aspirin(unless otherwise instructed by your surgeon), Aleve, Naproxen, Ibuprofen, Motrin, Advil, Goody's, BC's, all herbal medications, fish oil, and all vitamins  FOLLOW YOUR surgeon's instructions on stopping Eliquis (Apizaban)    Do not wear jewelry, make-up or nail polish.  Do not wear lotions, powders, or perfumes, or deodorant.  Do not shave 48 hours prior to surgery.  Men may shave face and neck.  Do not bring valuables to the hospital.  [facility] is not responsible for any belongings or valuables.  Contacts, dentures or bridgework may not be worn into surgery.  Leave your suitcase in the car.  After surgery it may be brought to your room.  For patients admitted to the hospital, discharge time will be determined by your treatment team.  Patients discharged the day of surgery will not be allowed to drive home.   Special instructions:    [facility]- Preparing For Surgery  Before surgery, you can play an important role. Because skin is not sterile, your skin needs to be as free of germs as possible. You can reduce the number of germs on your skin by washing with CHG (chlorahexidine gluconate) Soap before surgery.  CHG is an antiseptic cleaner which kills germs and  bonds with the skin to continue killing germs even after washing.    Oral Hygiene is also important to reduce your risk of infection.  Remember - BRUSH YOUR TEETH THE MORNING OF SURGERY WITH YOUR REGULAR TOOTHPASTE  Please do not use if you have an allergy to CHG or antibacterial soaps. If your skin becomes reddened/irritated stop using the CHG.  Do not shave (including legs and underarms) for at least 48 hours prior to first CHG shower. It is OK to shave your face.  Please follow these instructions carefully.   1. Shower the NIGHT BEFORE SURGERY and the MORNING OF SURGERY with CHG.   2. If you chose to wash your hair, wash your hair first as usual with your normal shampoo.  3. After you shampoo, rinse your hair and body thoroughly to remove the shampoo.  4. Use CHG as you would any other liquid soap. You can apply CHG directly to the skin and wash gently with a scrungie or a clean washcloth.   5. Apply the CHG Soap to your body ONLY FROM THE NECK DOWN.  Do not use on open wounds or open sores. Avoid contact with your eyes, ears, mouth and genitals (private parts). Wash Face and genitals (private parts)  with your normal soap.  6. Wash thoroughly, paying special attention to the area where your surgery will be performed.  7. Thoroughly rinse your body with warm water from the neck down.  8. DO NOT shower/wash with your normal  soap after using and rinsing off the CHG Soap.  9. Pat yourself dry with a CLEAN TOWEL.  10. Wear CLEAN PAJAMAS to bed the night before surgery, wear comfortable clothes the morning of surgery  11. Place CLEAN SHEETS on your bed the night of your first shower and DO NOT SLEEP WITH PETS.    Day of Surgery:  Do not apply any deodorants/lotions.  Please wear clean clothes to the hospital/surgery center.   Remember to brush your teeth WITH YOUR REGULAR TOOTHPASTE.    Please read over the following fact sheets that you were given. Coughing and Deep Breathing  and Surgical Site Infection Prevention

## 2018-01-02 ENCOUNTER — Other Ambulatory Visit: Payer: Self-pay

## 2018-01-02 ENCOUNTER — Encounter (HOSPITAL_COMMUNITY)
Admission: RE | Admit: 2018-01-02 | Discharge: 2018-01-02 | Disposition: A | Discharge status: Home / Self Care | Payer: Medicare Other | Source: Ambulatory Visit | Attending: General Surgery | Admitting: General Surgery

## 2018-01-02 ENCOUNTER — Encounter (HOSPITAL_COMMUNITY): Payer: Self-pay

## 2018-01-02 DIAGNOSIS — Z01812 Encounter for preprocedural laboratory examination: Secondary | ICD-10-CM | POA: Insufficient documentation

## 2018-01-02 DIAGNOSIS — C8515 Unspecified B-cell lymphoma, lymph nodes of inguinal region and lower limb: Secondary | ICD-10-CM | POA: Insufficient documentation

## 2018-01-02 HISTORY — DX: Unspecified osteoarthritis, unspecified site: M19.90

## 2018-01-02 HISTORY — DX: Cardiac murmur, unspecified: R01.1

## 2018-01-02 HISTORY — DX: Essential (primary) hypertension: I10

## 2018-01-02 HISTORY — DX: Acute myocardial infarction, unspecified: I21.9

## 2018-01-02 HISTORY — DX: Cardiac arrhythmia, unspecified: I49.9

## 2018-01-02 LAB — COMPREHENSIVE METABOLIC PANEL
ALBUMIN: 4.1 g/dL (ref 3.5–5.0)
ALK PHOS: 107 U/L (ref 38–126)
ALT: 10 U/L (ref 0–44)
AST: 27 U/L (ref 15–41)
Anion gap: 7 (ref 5–15)
BUN: 20 mg/dL (ref 8–23)
CALCIUM: 9.7 mg/dL (ref 8.9–10.3)
CO2: 28 mmol/L (ref 22–32)
Chloride: 105 mmol/L (ref 98–111)
Creatinine, Ser: 1.09 mg/dL — ABNORMAL HIGH (ref 0.44–1.00)
GFR calc Af Amer: 51 mL/min — ABNORMAL LOW (ref >60)
GFR calc non Af Amer: 44 mL/min — ABNORMAL LOW (ref >60)
GLUCOSE: 82 mg/dL (ref 70–99)
Potassium: 4.3 mmol/L (ref 3.5–5.1)
SODIUM: 140 mmol/L (ref 135–145)
TOTAL PROTEIN: 6.7 g/dL (ref 6.5–8.1)
Total Bilirubin: 0.6 mg/dL (ref 0.3–1.2)

## 2018-01-02 LAB — CBC WITH DIFFERENTIAL/PLATELET
Abs Immature Granulocytes: 0.01 10*3/uL (ref 0.00–0.07)
BASOS ABS: 0.1 10*3/uL (ref 0.0–0.1)
Basophils Relative: 1 %
EOS ABS: 0.2 10*3/uL (ref 0.0–0.5)
EOS PCT: 3 %
HEMATOCRIT: 46.5 % — AB (ref 36.0–46.0)
HEMOGLOBIN: 14.4 g/dL (ref 12.0–15.0)
IMMATURE GRANULOCYTES: 0 %
LYMPHS ABS: 0.9 10*3/uL (ref 0.7–4.0)
LYMPHS PCT: 13 %
MCH: 29.4 pg (ref 26.0–34.0)
MCHC: 31 g/dL (ref 30.0–36.0)
MCV: 95.1 fL (ref 80.0–100.0)
MONO ABS: 0.5 10*3/uL (ref 0.1–1.0)
MONOS PCT: 7 %
NRBC: 0 % (ref 0.0–0.2)
Neutro Abs: 5.3 10*3/uL (ref 1.7–7.7)
Neutrophils Relative %: 76 %
Platelets: 238 10*3/uL (ref 150–400)
RBC: 4.89 MIL/uL (ref 3.87–5.11)
RDW: 13.9 % (ref 11.5–15.5)
WBC: 7 10*3/uL (ref 4.0–10.5)

## 2018-01-02 LAB — PROTIME-INR
INR: 1.01
Prothrombin Time: 13.2 seconds (ref 11.4–15.2)

## 2018-01-02 NOTE — Progress Notes (Signed)
Pt. Reports that she had an event identified as "broken heart" MI, Takotsubo cardiomyelopathy in the past ; L heart cath. at the time showed clear results. Pt. Being followed by Dr. Curt Bears for afib .  Pt. Has not been given instructions on  change  of Eliquis in preparation of surgery. A called was placed to [facility]- spoke with Sunday Spillers & she is aware that after there is clearance & direction on Eliquis that the pt. Should be notified.  Pt.'s daughter is with her today & is equally aware. Pt. Denies chest concerns.

## 2018-01-03 ENCOUNTER — Encounter (HOSPITAL_COMMUNITY): Payer: Self-pay

## 2018-01-03 NOTE — Progress Notes (Signed)
Anesthesia Chart Review:  Case:  [id number] Date/Time:  01/14/18 0915   Procedure:  EXCISION DEEP LEFT INGUINAL LYMPH NODE BIOPSYERAS PATHWAY (Left )   Anesthesia type:  General   Pre-op diagnosis:  lymphoma   Location:  [facility] ROOM 02 / [facility]   Surgeon:  Fanny Skates, MD      DISCUSSION: Patient is an 84 year old female scheduled for the above procedure.   History includes never smoker, marginal zone lymphoma of intra-abdominal (diagnosed 2015, s/p Rituxan/Treanda; palpable left inguinal LN 11/2017), HTN, murmur, afib, MI (NSTEMI 06/15/15 peak troponin 7.5, LHC showed normal coronaries, stress-induced cardiomyopathy, borderline apical akinesis, EF >55% by echo).   She had recent cardiology follow-up. Per [facility] pharmacist, patient can hold Eliquis for three days prior to surgery.  If no acute changes then I would anticipate that she can proceed as planned.    VS: BP 118/88   Pulse 91   Temp (!) 36.4 C   Resp 18   Ht 5' (1.524 m)   Wt 55.4 kg   SpO2 99%   BMI 23.85 kg/m    PROVIDERS: [name]., MD is PCP ([place]) Burney Gauze, MD is HEM-ONC. Last visit with [name], NP on 11/28/17.  Allegra Lai, MD is EP cardiologist. Last visit 11/07/17. Six month follow-up recommended, sooner if further episodes of bradycardia with dizziness. (Previously her cardiologist was with [facility]; Rica Records, MD).   LABS: Labs reviewed: Acceptable for surgery. (all labs ordered are listed, but only abnormal results are displayed)  Labs Reviewed  CBC WITH DIFFERENTIAL/PLATELET - Abnormal; Notable for the following components:      Result Value   HCT 46.5 (*)    All other components within normal limits  COMPREHENSIVE METABOLIC PANEL - Abnormal; Notable for the following components:   Creatinine, Ser 1.09 (*)    GFR calc non Af Amer 44 (*)    GFR calc Af Amer 51 (*)    All other components within normal limits  PROTIME-INR     IMAGES: CT chest/abd/pelvis 12/04/17: IMPRESSION: 1. New left axillary lymph node and enlarging inguinal lymph nodes, worrisome for lymphoma recurrence. 2. 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 3.  Aortic atherosclerosis (ICD10-170.0). 4. Calcified splenic artery aneurysms.   EKG: 07/18/17: Afib at 78 bpm   CV: 48 Hour Holter monitor 02/05/17: Minimum HR: 48 BPM at 4:09:12 PM Maximum HR: 144 BPM at 9:53:16 PM(2) Average HR: 75 BPM <1% PVCs Predominant rhythm atrial fibrillation No pauses  Cardiac cath 06/17/15 ([facility]): Findings: 1. No angiographically apparent coronary artery disease. 2. Normal left ventricular filling pressures (LVEDP = 9 mm Hg). 3. Borderline left ventricular contraction with apical akinesis,  suggestive of stress-induced cardiomyopathy. Recommendations: 1. Cardiovascular risk reduction. 2. Follow up with primary cardiologist.  Echo 06/16/15 ([facility]):  Normal left ventricular systolic function, ejection fraction > 04%  Diastolic dysfunction - grade I (normal filling pressures)  Dilated left atrium - mild  Degenerative mitral valve disease  Tricuspid regurgitation - mild  Pulmonic regurgitation - mild  Normal right ventricular systolic function  Dilated right atrium - mild   Past Medical History:  Diagnosis Date  . Arthritis    hands -oa  . Dysrhythmia    afib  . Heart murmur   . Hypertension   . 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 ([place]) 12/11/2013  . Myocardial infarction ([place])    06/15/15: stress induced cardiomyopathy    Past  Surgical History:  Procedure Laterality Date  . CARDIAC CATHETERIZATION     06/17/15 [facility]): No CAD, LVEDP 9 mmHg, borderline LV contraction with apical akinesis, suggestive of stress-induced CM. EF > 55% by 06/16/15 echo.  . ELBOW BURSA SURGERY Right 2016  . EYE SURGERY     cataracts removed ,both eyes, /w IOL  . TONSILLECTOMY      MEDICATIONS: . apixaban  (ELIQUIS) 2.5 MG TABS tablet  . diltiazem (CARDIZEM SR) 60 MG 12 hr capsule  . mirtazapine (REMERON) 30 MG tablet   No current facility-administered medications for this encounter.     George Hugh [facility] Short Stay Center/Anesthesiology Phone [phone] 01/03/2018 11:13 AM

## 2018-01-12 NOTE — H&P (Signed)
Kathy Massey Location: [facility] Patient #: [id number] DOB: [birth year] Widowed / Language: Cleophus Molt / Race: White Female      History of Present Illness   This is a very pleasant 84 year old female. She is referred by Dr. Burney Gauze for consultation regarding new adenopathy in the setting of known marginal zone lymphoma.    her son and daughter are with her throughout the encounter. Her primary doctor is Dr. Idamae Schuller. Her cardiologist is [facility] [employer]  She is doing well at 84 y.o.  . Lives at [facility] independent living. Drives her own car. Children live nearby. She was treated with Rituxan and Treanda for her marginal zone lymphoma. This was completed February 2018. Recent CT scan shows new left axillary lymph node and enlarging inguinal lymph nodes worrisome for lymphoma recurrence. Chronic right UPJ obstruction. Calcified splenic artery aneurysms. She denies weight loss, fever, night sweats, pulmonary or cardiac or GI symptoms.     Past history significant for stress induced cardiomyopathy but last ultrasound showed normal ejection fraction. Chronic atrial fibrillation on Eliquis. Marginal Zone lymphoma. Family history mother and father had coronary artery disease Socially she lives at [facility] in independent living. Drives her car. Daughter and son are with her today      Exam of the axilla was fairly negative but she has bilateral inguinal lymph nodes. I chose to recommend excisional biopsy of deep left inguinal lymph node. She agrees with this. I discussed the indications, details, techniques, numerous risk of the surgery with her and her children. She is aware the risks of bleeding, infection, nerve damage with chronic pain or numbness, vascular injury, seroma, hematoma and other unforeseen problems. She understands all these issues. All of her questions were answered. She agrees with this plan.  We will ask her cardiologist for  cardiac risk assessment and clearance for surgery and permission to stop her Eliquis 3 days preop   Past Surgical History  Sentinel Lymph Node Biopsy   Diagnostic Studies History  Colonoscopy  >10 years ago Mammogram  >3 years ago Pap Smear  >5 years ago  Allergies  No Known Drug Allergies : Allergies Reconciled   Medication History  Eliquis (2.5MG  Tablet, Oral) Active. Fluocinonide (0.05% Cream, External) Active. Multi-Vitamin (Oral) Active. Medications Reconciled  Pregnancy / Birth History  Gravida  2 Length (months) of breastfeeding  3-6 Maternal age  59-25 Para  3  Other Problems  Atrial Fibrillation  Melanoma     Review of Systems  General Not Present- Appetite Loss, Chills, Fatigue, Fever, Night Sweats, Weight Gain and Weight Loss. Skin Present- Non-Healing Wounds. Not Present- Change in Wart/Mole, Dryness, Hives, Jaundice, New Lesions, Rash and Ulcer. HEENT Not Present- Earache, Hearing Loss, Hoarseness, Nose Bleed, Oral Ulcers, Ringing in the Ears, Seasonal Allergies, Sinus Pain, Sore Throat, Visual Disturbances, Wears glasses/contact lenses and Yellow Eyes. Respiratory Not Present- Bloody sputum, Chronic Cough, Difficulty Breathing, Snoring and Wheezing. Breast Not Present- Breast Mass, Breast Pain, Nipple Discharge and Skin Changes. Cardiovascular Not Present- Chest Pain, Difficulty Breathing Lying Down, Leg Cramps, Palpitations, Rapid Heart Rate, Shortness of Breath and Swelling of Extremities. Gastrointestinal Not Present- Abdominal Pain, Bloating, Bloody Stool, Change in Bowel Habits, Chronic diarrhea, Constipation, Difficulty Swallowing, Excessive gas, Gets full quickly at meals, Hemorrhoids, Indigestion, Nausea, Rectal Pain and Vomiting. Female Genitourinary Present- Urgency. Not Present- Frequency, Nocturia, Painful Urination and Pelvic Pain. Musculoskeletal Not Present- Back Pain, Joint Pain, Joint Stiffness, Muscle Pain, Muscle Weakness and  Swelling of Extremities. Neurological  Present- Decreased Memory. Not Present- Fainting, Headaches, Numbness, Seizures, Tingling, Tremor, Trouble walking and Weakness. Psychiatric Not Present- Anxiety, Bipolar, Change in Sleep Pattern, Depression, Fearful and Frequent crying. Endocrine Not Present- Cold Intolerance, Excessive Hunger, Hair Changes, Heat Intolerance, Hot flashes and New Diabetes. Hematology Present- Blood Thinners and Easy Bruising. Not Present- Excessive bleeding, Gland problems, HIV and Persistent Infections.  Vitals  Weight: 122.6 lb Height: 61in Body Surface Area: 1.53 m Body Mass Index: 23.16 kg/m  Temp.: 96.76F(Temporal)  Pulse: 74 (Regular)  P.OX: 99% (Room air) BP: 120/78 (Sitting, Left Arm, Standard)     Physical Exam General Mental Status-Alert. General Appearance-Consistent with stated age. Hydration-Well hydrated. Voice-Normal.  Head and Neck Head-normocephalic, atraumatic with no lesions or palpable masses. Trachea-midline. Thyroid Gland Characteristics - normal size and consistency.  Eye Eyeball - Bilateral-Extraocular movements intact. Sclera/Conjunctiva - Bilateral-No scleral icterus.  Chest and Lung Exam Chest and lung exam reveals -quiet, even and easy respiratory effort with no use of accessory muscles and on auscultation, normal breath sounds, no adventitious sounds and normal vocal resonance. Inspection Chest Wall - Normal. Back - normal.  Cardiovascular Cardiovascular examination reveals -normal heart sounds, regular rate and rhythm with no murmurs and normal pedal pulses bilaterally.  Abdomen Inspection Inspection of the abdomen reveals - No Hernias. Skin - Scar - no surgical scars. Palpation/Percussion Palpation and Percussion of the abdomen reveal - Soft, Non Tender, No Rebound tenderness, No Rigidity (guarding) and No hepatosplenomegaly. Auscultation Auscultation of the abdomen reveals - Bowel  sounds normal.  Neurologic Neurologic evaluation reveals -alert and oriented x 3 with no impairment of recent or remote memory. Mental Status-Normal.  Musculoskeletal Normal Exam - Left-Upper Extremity Strength Normal and Lower Extremity Strength Normal. Normal Exam - Right-Upper Extremity Strength Normal and Lower Extremity Strength Normal.  Lymphatic Note: Cervical and supraclavicular lymph node areas are negative No pathologic axillary adenopathy, despite the CT finding report Obvious palpable lymph nodes, left greater than right inguinal areas. CT scan it looks like there are 2 nodes on the left and one on the right     Assessment & Plan  INGUINAL ADENOPATHY (R59.0)  You have been under treatment for marginal zone lymphoma Dr. Marin Olp called me to consider lymph node biopsy Recent CT scan shows enlarged bilateral inguinal lymph nodes and small left axillary lymph node.  I do not feel any abnormal lymph nodes in your axilla You have palpable lymph nodes in both groins You'll be scheduled for excisional biopsy deep left inguinal lymph node to clarify whether your lymphoma has changed I have discussed the indications, techniques and risks of the surgery with you and your children in detail  We will ask your cardiologist for a risk assessment and permission to stop the Eliquis 3 days preop We must stop the Eliquis 3 days preop you will need to stay with one of your children for 24 hours after the surgery  MARGINAL ZONE LYMPHOMA (C85.80) ATRIAL FIBRILLATION, CHRONIC (I48.20) ANTICOAGULATED (Z79.01) Impression: eliquis STRESS-INDUCED CARDIOMYOPATHY (I51.81)    Santrice Muzio M. Dalbert Batman, M.D., [facility] General and Minimally invasive Surgery Breast and Colorectal Surgery Office:   [phone] Pager:   [phone]

## 2018-01-14 ENCOUNTER — Ambulatory Visit (HOSPITAL_COMMUNITY)
Admission: RE | Admit: 2018-01-14 | Discharge: 2018-01-14 | Disposition: A | Discharge status: Home / Self Care | Payer: Medicare Other | Source: Ambulatory Visit | Attending: General Surgery | Admitting: General Surgery

## 2018-01-14 ENCOUNTER — Ambulatory Visit (HOSPITAL_COMMUNITY): Payer: Medicare Other | Admitting: Anesthesiology

## 2018-01-14 ENCOUNTER — Encounter (HOSPITAL_COMMUNITY): Payer: Self-pay | Admitting: Registered Nurse

## 2018-01-14 ENCOUNTER — Encounter (HOSPITAL_COMMUNITY)
Admission: RE | Disposition: A | Discharge status: Home / Self Care | Payer: Self-pay | Source: Ambulatory Visit | Attending: General Surgery

## 2018-01-14 ENCOUNTER — Ambulatory Visit (HOSPITAL_COMMUNITY): Payer: Medicare Other | Admitting: Vascular Surgery

## 2018-01-14 ENCOUNTER — Other Ambulatory Visit: Payer: Self-pay

## 2018-01-14 DIAGNOSIS — Z7901 Long term (current) use of anticoagulants: Secondary | ICD-10-CM | POA: Insufficient documentation

## 2018-01-14 DIAGNOSIS — Z7952 Long term (current) use of systemic steroids: Secondary | ICD-10-CM | POA: Diagnosis not present

## 2018-01-14 DIAGNOSIS — I252 Old myocardial infarction: Secondary | ICD-10-CM | POA: Diagnosis not present

## 2018-01-14 DIAGNOSIS — I482 Chronic atrial fibrillation, unspecified: Secondary | ICD-10-CM | POA: Diagnosis not present

## 2018-01-14 DIAGNOSIS — C8583 Other specified types of non-Hodgkin lymphoma, intra-abdominal lymph nodes: Secondary | ICD-10-CM | POA: Diagnosis present

## 2018-01-14 DIAGNOSIS — Z8572 Personal history of non-Hodgkin lymphomas: Secondary | ICD-10-CM | POA: Insufficient documentation

## 2018-01-14 DIAGNOSIS — C8285 Other types of follicular lymphoma, lymph nodes of inguinal region and lower limb: Secondary | ICD-10-CM | POA: Insufficient documentation

## 2018-01-14 DIAGNOSIS — Z8249 Family history of ischemic heart disease and other diseases of the circulatory system: Secondary | ICD-10-CM | POA: Diagnosis not present

## 2018-01-14 DIAGNOSIS — R590 Localized enlarged lymph nodes: Secondary | ICD-10-CM | POA: Diagnosis present

## 2018-01-14 HISTORY — PX: LYMPH NODE BIOPSY: SHX201

## 2018-01-14 SURGERY — LYMPH NODE BIOPSY
Anesthesia: General | Site: Groin | Laterality: Left

## 2018-01-14 MED ORDER — ONDANSETRON HCL 4 MG/2ML IJ SOLN: 4.0000 mg | Freq: Once | INTRAMUSCULAR | Status: DC | PRN

## 2018-01-14 MED ORDER — FENTANYL CITRATE (PF) 250 MCG/5ML IJ SOLN
INTRAMUSCULAR | Status: AC
  Filled 2018-01-14: qty 5

## 2018-01-14 MED ORDER — BUPIVACAINE-EPINEPHRINE 0.25% -1:200000 IJ SOLN
INTRAMUSCULAR | Status: DC | PRN
  Administered 2018-01-14: 8 mL

## 2018-01-14 MED ORDER — CEFAZOLIN SODIUM-DEXTROSE 2-4 GM/100ML-% IV SOLN
2.0000 g | INTRAVENOUS | Status: AC
  Administered 2018-01-14: 2 g via INTRAVENOUS
  Filled 2018-01-14: qty 100

## 2018-01-14 MED ORDER — DEXAMETHASONE SODIUM PHOSPHATE 10 MG/ML IJ SOLN
INTRAMUSCULAR | Status: DC | PRN
  Administered 2018-01-14: 4 mg via INTRAVENOUS

## 2018-01-14 MED ORDER — FENTANYL CITRATE (PF) 250 MCG/5ML IJ SOLN
INTRAMUSCULAR | Status: DC | PRN
  Administered 2018-01-14 (×2): 25 ug via INTRAVENOUS

## 2018-01-14 MED ORDER — ONDANSETRON HCL 4 MG/2ML IJ SOLN
INTRAMUSCULAR | Status: DC | PRN
  Administered 2018-01-14: 4 mg via INTRAVENOUS

## 2018-01-14 MED ORDER — PHENYLEPHRINE HCL 10 MG/ML IJ SOLN
INTRAMUSCULAR | Status: DC | PRN
  Administered 2018-01-14 (×4): 80 ug via INTRAVENOUS

## 2018-01-14 MED ORDER — LACTATED RINGERS IV SOLN
INTRAVENOUS | Status: DC
  Administered 2018-01-14: 09:00:00 via INTRAVENOUS

## 2018-01-14 MED ORDER — BUPIVACAINE-EPINEPHRINE (PF) 0.25% -1:200000 IJ SOLN
INTRAMUSCULAR | Status: AC
  Filled 2018-01-14: qty 30

## 2018-01-14 MED ORDER — LIDOCAINE 2% (20 MG/ML) 5 ML SYRINGE
INTRAMUSCULAR | Status: DC | PRN
  Administered 2018-01-14: 80 mg via INTRAVENOUS

## 2018-01-14 MED ORDER — GABAPENTIN 300 MG PO CAPS
300.0000 mg | ORAL_CAPSULE | ORAL | Status: AC
  Administered 2018-01-14: 300 mg via ORAL
  Filled 2018-01-14: qty 1

## 2018-01-14 MED ORDER — PROPOFOL 10 MG/ML IV BOLUS
INTRAVENOUS | Status: AC
  Filled 2018-01-14: qty 20

## 2018-01-14 MED ORDER — PROPOFOL 10 MG/ML IV BOLUS
INTRAVENOUS | Status: DC | PRN
  Administered 2018-01-14: 100 mg via INTRAVENOUS

## 2018-01-14 MED ORDER — CHLORHEXIDINE GLUCONATE CLOTH 2 % EX PADS: 6.0000 | MEDICATED_PAD | Freq: Once | CUTANEOUS | Status: DC

## 2018-01-14 MED ORDER — 0.9 % SODIUM CHLORIDE (POUR BTL) OPTIME
TOPICAL | Status: DC | PRN
  Administered 2018-01-14: 1000 mL

## 2018-01-14 MED ORDER — MEPERIDINE HCL 50 MG/ML IJ SOLN: 6.2500 mg | INTRAMUSCULAR | Status: DC | PRN

## 2018-01-14 MED ORDER — ACETAMINOPHEN 500 MG PO TABS
1000.0000 mg | ORAL_TABLET | ORAL | Status: AC
  Administered 2018-01-14: 1000 mg via ORAL
  Filled 2018-01-14: qty 2

## 2018-01-14 MED ORDER — FENTANYL CITRATE (PF) 100 MCG/2ML IJ SOLN: 25.0000 ug | INTRAMUSCULAR | Status: DC | PRN

## 2018-01-14 SURGICAL SUPPLY — 37 items
APPLIER CLIP 9.375 MED OPEN (MISCELLANEOUS) ×3
CHLORAPREP W/TINT 26ML (MISCELLANEOUS) ×3 IMPLANT
CLIP APPLIE 9.375 MED OPEN (MISCELLANEOUS) ×1 IMPLANT
CONT SPEC 4OZ CLIKSEAL STRL BL (MISCELLANEOUS) ×3 IMPLANT
COVER SURGICAL LIGHT HANDLE (MISCELLANEOUS) ×3 IMPLANT
COVER WAND RF STERILE (DRAPES) ×3 IMPLANT
DECANTER SPIKE VIAL GLASS SM (MISCELLANEOUS) IMPLANT
DERMABOND ADVANCED (GAUZE/BANDAGES/DRESSINGS) ×2
DERMABOND ADVANCED .7 DNX12 (GAUZE/BANDAGES/DRESSINGS) ×1 IMPLANT
DRAPE LAPAROTOMY 100X72 PEDS (DRAPES) ×3 IMPLANT
DRAPE UTILITY XL STRL (DRAPES) IMPLANT
ELECT CAUTERY BLADE 6.4 (BLADE) ×3 IMPLANT
ELECT REM PT RETURN 9FT ADLT (ELECTROSURGICAL) ×3
ELECTRODE REM PT RTRN 9FT ADLT (ELECTROSURGICAL) ×1 IMPLANT
GLOVE EUDERMIC 7 POWDERFREE (GLOVE) ×3 IMPLANT
GOWN STRL REUS W/ TWL LRG LVL3 (GOWN DISPOSABLE) ×1 IMPLANT
GOWN STRL REUS W/ TWL XL LVL3 (GOWN DISPOSABLE) ×1 IMPLANT
GOWN STRL REUS W/TWL LRG LVL3 (GOWN DISPOSABLE) ×2
GOWN STRL REUS W/TWL XL LVL3 (GOWN DISPOSABLE) ×2
KIT BASIN OR (CUSTOM PROCEDURE TRAY) ×3 IMPLANT
KIT TURNOVER KIT B (KITS) ×3 IMPLANT
NEEDLE HYPO 25GX1X1/2 BEV (NEEDLE) ×3 IMPLANT
NS IRRIG 1000ML POUR BTL (IV SOLUTION) ×3 IMPLANT
PACK GENERAL/GYN (CUSTOM PROCEDURE TRAY) ×3 IMPLANT
PACK SURGICAL SETUP 50X90 (CUSTOM PROCEDURE TRAY) ×3 IMPLANT
PAD ARMBOARD 7.5X6 YLW CONV (MISCELLANEOUS) IMPLANT
PENCIL BUTTON HOLSTER BLD 10FT (ELECTRODE) IMPLANT
PENCIL SMOKE EVACUATOR (MISCELLANEOUS) ×3 IMPLANT
SPONGE LAP 4X18 RFD (DISPOSABLE) IMPLANT
SUT MON AB 4-0 PC3 18 (SUTURE) ×3 IMPLANT
SUT VIC AB 3-0 54X BRD REEL (SUTURE) ×1 IMPLANT
SUT VIC AB 3-0 BRD 54 (SUTURE) ×2
SUT VIC AB 3-0 SH 18 (SUTURE) ×3 IMPLANT
SYR BULB 3OZ (MISCELLANEOUS) IMPLANT
SYR CONTROL 10ML LL (SYRINGE) ×3 IMPLANT
TOWEL OR 17X24 6PK STRL BLUE (TOWEL DISPOSABLE) ×3 IMPLANT
TOWEL OR 17X26 10 PK STRL BLUE (TOWEL DISPOSABLE) ×3 IMPLANT

## 2018-01-14 NOTE — Anesthesia Procedure Notes (Addendum)
Procedure Name: LMA Insertion Date/Time: 01/14/2018 9:32 AM Performed by: Jearld Pies, CRNA Pre-anesthesia Checklist: Patient identified, Emergency Drugs available, Suction available and Patient being monitored Patient Re-evaluated:Patient Re-evaluated prior to induction Oxygen Delivery Method: Circle System Utilized Preoxygenation: Pre-oxygenation with 100% oxygen Induction Type: IV induction Ventilation: Mask ventilation without difficulty LMA: LMA inserted LMA Size: 3.0 Number of attempts: 1 Airway Equipment and Method: Bite block Placement Confirmation: positive ETCO2 Tube secured with: Tape Dental Injury: Teeth and Oropharynx as per pre-operative assessment

## 2018-01-14 NOTE — Anesthesia Postprocedure Evaluation (Signed)
Anesthesia Post Note  Patient: Kathy Massey  Procedure(s) Performed: EXCISION DEEP LEFT INGUINAL LYMPH NODE BIOPSY ERAS PATHWAY (Left Groin)     Patient location during evaluation: PACU Anesthesia Type: General Level of consciousness: awake Pain management: pain level controlled Vital Signs Assessment: post-procedure vital signs reviewed and stable Respiratory status: spontaneous breathing Cardiovascular status: stable Postop Assessment: no apparent nausea or vomiting Anesthetic complications: no    Last Vitals:  Vitals:   01/14/18 1016 01/14/18 1017  BP:  113/69  Pulse:  77  Resp:  10  Temp: 36.5 C   SpO2:  98%    Last Pain:  Vitals:   01/14/18 1045  PainSc: 0-No pain   Pain Goal:                 Huston Foley

## 2018-01-14 NOTE — Discharge Instructions (Signed)
Keep the wound clean and dry for the most part.  Ice pack intermittently for 36 hours while awake 10 minutes at a time  You may shower starting tomorrow No tub baths or hot tubs for 3 weeks  You may drive a car in 1 week when you are completely comfortable  For pain use the ice pack and take Tylenol 1000 mg every 6 hours for 36 hours, then as needed  Call if there is any unusual pain or swelling

## 2018-01-14 NOTE — Interval H&P Note (Signed)
History and Physical Interval Note:  01/14/2018 8:41 AM  Kathy Massey  has presented today for surgery, with the diagnosis of lymphoma  The various methods of treatment have been discussed with the patient and family. After consideration of risks, benefits and other options for treatment, the patient has consented to  Procedure(s): EXCISION DEEP LEFT INGUINAL LYMPH NODE BIOPSY ERAS PATHWAY (Left) as a surgical intervention .  The patient's history has been reviewed, patient examined, no change in status, stable for surgery.  I have reviewed the patient's chart and labs.  Questions were answered to the patient's satisfaction.     [name]

## 2018-01-14 NOTE — Transfer of Care (Signed)
Immediate Anesthesia Transfer of Care Note  Patient: Kathy Massey  Procedure(s) Performed: EXCISION DEEP LEFT INGUINAL LYMPH NODE BIOPSY ERAS PATHWAY (Left Groin)  Patient Location: PACU  Anesthesia Type:General  Level of Consciousness: awake, alert  and oriented  Airway & Oxygen Therapy: Patient Spontanous Breathing and Patient connected to nasal cannula oxygen  Post-op Assessment: Report given to RN and Post -op Vital signs reviewed and stable  Post vital signs: Reviewed and stable  Last Vitals:  Vitals Value Taken Time  BP 113/69 01/14/2018 10:17 AM  Temp 36.5 C 01/14/2018 10:16 AM  Pulse 76 01/14/2018 10:18 AM  Resp 10 01/14/2018 10:18 AM  SpO2 97 % 01/14/2018 10:18 AM  Vitals shown include unvalidated device data.  Last Pain:  Vitals:   01/14/18 1016  PainSc: 0-No pain         Complications: No apparent anesthesia complications

## 2018-01-14 NOTE — Op Note (Signed)
Patient Name:           Kathy Massey   Date of Surgery:        01/14/2018  Pre op Diagnosis:      Left inguinal adenopathy                                       History marginal zone lymphoma  Post op Diagnosis:    Same  Procedure:                 Excision deep left inguinal lymph node  Surgeon:                     Edsel Petrin. Dalbert Batman, M.D., FACS  Assistant:                      OR staff  Operative Indications:   This is a very pleasant 84 year old female. She is referred by Dr. Burney Gauze for consultation regarding new adenopathy in the setting of known marginal zone lymphoma.    Her primary doctor is Dr. Idamae Schuller. Her cardiologist is [facility] [employer]       She is doing well at 84 y.o.  . Lives at [facility] independent living. Drives her own car. Children live nearby. She was treated with Rituxan and Treanda for her marginal zone lymphoma. This was completed February 2018. Recent CT scan shows new left axillary lymph node and enlarging inguinal lymph nodes worrisome for lymphoma recurrence. Chronic right UPJ obstruction. Calcified splenic artery aneurysms.     Past history significant for stress induced cardiomyopathy but last ultrasound showed normal ejection fraction. Chronic atrial fibrillation on Eliquis. Marginal Zone lymphoma. I discussed the indications, details, techniques, numerous risk of left inguinal lymph node excision with her and her children. She agrees with this plan. She stopped her Eliquis 3 days preop  Operative Findings:       There was a large left inguinal lymph node.  Very deep and just anterior and inferior to the left inguinal ligament.  This was about 2-1/2 cm long by about 1-1/2 cm wide.  Abnormally enlarged.  Numerous lymphatics and vascular tributaries which were all controlled with metal clips.  Wound was clean.  Procedure in Detail:          Following the induction of general LMA anesthesia the patient's lower abdomen groins  thigh and genitalia were prepped and draped in a sterile fashion.  Intravenous antibiotics were given.  Surgical timeout was performed.  0.25% Marcaine with epinephrine was used as a local infiltration anesthetic.  A linear oblique incision was made in the left groin overlying the palpable lymph node.  This was above the inguinal crease.  Dissection was carried down through Scarpa's fascia.  We dissected out the lymph node slowly and and numerous small steps controlling lymphatic and vascular tributaries with metal clips.  The lymph node was removed intact and sent fresh to the lab with the history attached.  The wound was irrigated with saline.  Hemostasis was excellent.  Subcutaneous tissue and Scarpa's fascia was closed with 3-0 Vicryl sutures and the skin closed with a running subcuticular 4-0 Monocryl and Dermabond.  The patient tolerated the procedure well and was taken to PACU in stable condition.  EBL 10 cc.  Counts correct.  Complications none.   Addendum: I logged onto the [facility]  website and reviewed her prescription medication history     Cannan Beeck M. Dalbert Batman, M.D., FACS General and Minimally Invasive Surgery Breast and Colorectal Surgery  01/14/2018 10:20 AM

## 2018-01-14 NOTE — Anesthesia Preprocedure Evaluation (Signed)
Anesthesia Evaluation  Patient identified by MRN, date of birth, ID band Patient awake    Reviewed: Allergy & Precautions, NPO status , Patient's Chart, lab work & pertinent test results  Airway Mallampati: II       Dental   Pulmonary neg pulmonary ROS,    Pulmonary exam normal breath sounds clear to auscultation       Cardiovascular hypertension, Pt. on medications + Past MI  Normal cardiovascular exam+ dysrhythmias Atrial Fibrillation  Rhythm:Irregular Rate:Normal  Chronic AF   Neuro/Psych negative neurological ROS  negative psych ROS   GI/Hepatic negative GI ROS, Neg liver ROS,   Endo/Other    Renal/GU      Musculoskeletal   Abdominal Normal abdominal exam  (+)   Peds  Hematology   Anesthesia Other Findings See note from Glynn Octave for details  Reproductive/Obstetrics                             Anesthesia Physical Anesthesia Plan  ASA: III  Anesthesia Plan: General   Post-op Pain Management:    Induction: Intravenous  PONV Risk Score and Plan: 3 and Treatment may vary due to age or medical condition  Airway Management Planned: LMA  Additional Equipment:   Intra-op Plan:   Post-operative Plan: Extubation in OR  Informed Consent: I have reviewed the patients History and Physical, chart, labs and discussed the procedure including the risks, benefits and alternatives for the proposed anesthesia with the patient or authorized representative who has indicated his/her understanding and acceptance.   Dental advisory given  Plan Discussed with: CRNA  Anesthesia Plan Comments:         Anesthesia Quick Evaluation

## 2018-01-15 ENCOUNTER — Encounter (HOSPITAL_COMMUNITY): Payer: Self-pay | Admitting: General Surgery

## 2018-01-18 ENCOUNTER — Other Ambulatory Visit: Payer: Self-pay | Admitting: Hematology & Oncology

## 2018-01-18 DIAGNOSIS — C8583 Other specified types of non-Hodgkin lymphoma, intra-abdominal lymph nodes: Secondary | ICD-10-CM

## 2018-02-01 ENCOUNTER — Ambulatory Visit (HOSPITAL_COMMUNITY)
Admission: RE | Admit: 2018-02-01 | Discharge: 2018-02-01 | Disposition: A | Discharge status: Home / Self Care | Payer: Medicare Other | Source: Ambulatory Visit | Attending: Hematology & Oncology | Admitting: Hematology & Oncology

## 2018-02-01 DIAGNOSIS — C8583 Other specified types of non-Hodgkin lymphoma, intra-abdominal lymph nodes: Secondary | ICD-10-CM | POA: Insufficient documentation

## 2018-02-12 ENCOUNTER — Encounter (HOSPITAL_COMMUNITY)
Admission: RE | Admit: 2018-02-12 | Discharge: 2018-02-12 | Disposition: A | Discharge status: Home / Self Care | Payer: Medicare Other | Source: Ambulatory Visit | Attending: Hematology & Oncology | Admitting: Hematology & Oncology

## 2018-02-12 DIAGNOSIS — C8583 Other specified types of non-Hodgkin lymphoma, intra-abdominal lymph nodes: Secondary | ICD-10-CM | POA: Insufficient documentation

## 2018-02-12 LAB — GLUCOSE, CAPILLARY: Glucose-Capillary: 88 mg/dL (ref 70–99)

## 2018-02-12 MED ORDER — FLUDEOXYGLUCOSE F - 18 (FDG) INJECTION
6.3700 | Freq: Once | INTRAVENOUS | Status: AC | PRN
  Administered 2018-02-12: 6.37 via INTRAVENOUS

## 2018-02-14 ENCOUNTER — Encounter: Payer: Self-pay | Admitting: Hematology & Oncology

## 2018-02-14 ENCOUNTER — Inpatient Hospital Stay: Payer: Medicare Other | Attending: Hematology & Oncology

## 2018-02-14 ENCOUNTER — Other Ambulatory Visit: Payer: Self-pay

## 2018-02-14 ENCOUNTER — Inpatient Hospital Stay (HOSPITAL_BASED_OUTPATIENT_CLINIC_OR_DEPARTMENT_OTHER): Payer: Medicare Other | Admitting: Hematology & Oncology

## 2018-02-14 VITALS — BP 113/71 | HR 75 | Temp 98.3°F | Resp 18 | Wt 121.0 lb

## 2018-02-14 DIAGNOSIS — Z79899 Other long term (current) drug therapy: Secondary | ICD-10-CM | POA: Diagnosis not present

## 2018-02-14 DIAGNOSIS — R109 Unspecified abdominal pain: Secondary | ICD-10-CM

## 2018-02-14 DIAGNOSIS — Z9221 Personal history of antineoplastic chemotherapy: Secondary | ICD-10-CM | POA: Insufficient documentation

## 2018-02-14 DIAGNOSIS — C8583 Other specified types of non-Hodgkin lymphoma, intra-abdominal lymph nodes: Secondary | ICD-10-CM | POA: Insufficient documentation

## 2018-02-14 DIAGNOSIS — Z7901 Long term (current) use of anticoagulants: Secondary | ICD-10-CM | POA: Diagnosis not present

## 2018-02-14 DIAGNOSIS — F419 Anxiety disorder, unspecified: Secondary | ICD-10-CM | POA: Insufficient documentation

## 2018-02-14 DIAGNOSIS — D508 Other iron deficiency anemias: Secondary | ICD-10-CM

## 2018-02-14 LAB — CBC WITH DIFFERENTIAL (CANCER CENTER ONLY)
Abs Immature Granulocytes: 0.01 10*3/uL (ref 0.00–0.07)
BASOS ABS: 0 10*3/uL (ref 0.0–0.1)
Basophils Relative: 1 %
Eosinophils Absolute: 0.2 10*3/uL (ref 0.0–0.5)
Eosinophils Relative: 4 %
HCT: 45.8 % (ref 36.0–46.0)
Hemoglobin: 14.3 g/dL (ref 12.0–15.0)
Immature Granulocytes: 0 %
Lymphocytes Relative: 17 %
Lymphs Abs: 0.9 10*3/uL (ref 0.7–4.0)
MCH: 30 pg (ref 26.0–34.0)
MCHC: 31.2 g/dL (ref 30.0–36.0)
MCV: 96.2 fL (ref 80.0–100.0)
Monocytes Absolute: 0.4 10*3/uL (ref 0.1–1.0)
Monocytes Relative: 8 %
NRBC: 0 % (ref 0.0–0.2)
Neutro Abs: 3.5 10*3/uL (ref 1.7–7.7)
Neutrophils Relative %: 70 %
Platelet Count: 251 10*3/uL (ref 150–400)
RBC: 4.76 MIL/uL (ref 3.87–5.11)
RDW: 14.1 % (ref 11.5–15.5)
WBC Count: 5 10*3/uL (ref 4.0–10.5)

## 2018-02-14 LAB — CMP (CANCER CENTER ONLY)
ALBUMIN: 4.3 g/dL (ref 3.5–5.0)
ALT: 7 U/L (ref 0–44)
AST: 21 U/L (ref 15–41)
Alkaline Phosphatase: 123 U/L (ref 38–126)
Anion gap: 7 (ref 5–15)
BUN: 25 mg/dL — ABNORMAL HIGH (ref 8–23)
CO2: 28 mmol/L (ref 22–32)
Calcium: 9.4 mg/dL (ref 8.9–10.3)
Chloride: 104 mmol/L (ref 98–111)
Creatinine: 1.18 mg/dL — ABNORMAL HIGH (ref 0.44–1.00)
GFR, Est AFR Am: 48 mL/min — ABNORMAL LOW (ref >60)
GFR, Estimated: 41 mL/min — ABNORMAL LOW (ref >60)
GLUCOSE: 87 mg/dL (ref 70–99)
POTASSIUM: 4.3 mmol/L (ref 3.5–5.1)
Sodium: 139 mmol/L (ref 135–145)
Total Bilirubin: 0.7 mg/dL (ref 0.3–1.2)
Total Protein: 6.9 g/dL (ref 6.5–8.1)

## 2018-02-14 LAB — SAVE SMEAR(SSMR), FOR PROVIDER SLIDE REVIEW

## 2018-02-14 NOTE — Progress Notes (Signed)
Hematology and Oncology Follow Up Visit  Kathy Massey [id number] 1927-06-12 85 y.o. 02/14/2018   Principle Diagnosis:   Recurrent small B-cell follicular NHL  Current Therapy:   Acalabrutinib 100 mg po q day -- start on 02/25/2018     Interim History:  [name] is back for follow-up.  We now have conclusive proof of recurrent disease.  I am quite amazed at this actually.  She began to complain of some abdominal discomfort.  As always, Judson Roch, our fantastic nurse practitioner, was on top of this.  She referred Kathy Massey over to Dr. Dalbert Batman.  Dr. Dalbert Batman did a lymph node biopsy in the left inguinal region.  This was done on 01/14/2018.  The pathology report ([id number]) showed low-grade follicular B cell lymphoma.  We then did a PET scan on her.  This was actually done on 02/12/2018.  The PET scan showed hypermetabolic left axillary and bilateral inguinal lymph nodes.  There is no intrathoracic or intra-abdominal lymph nodes.  There is no splenomegaly or splenic activity.  She is 85 years old.  She has other health issues.  As such, we really need to be cautious with respect to being aggressive with our intervention.  I do think that it would be reasonable to treat her with oral therapy.  I think that acalabrutinib would be a very good option for her.  Her family came with her.  I reviewed her PET scan with him.  I went over the pathology report.  She has a performance status of ECOG 2, at best.  As such, I think acalabrutinib would be a good option for her.  I would treat her with half dose of acalabrutinib which would be 100 mg p.o. daily.  Kathy Massey is eating okay.  She is a little bit nervous and anxious.  She thinks that there is fluid collecting at the site of her biopsy.  She has had no bleeding.  She has had no cough.  She is had no shortness of breath.  Overall, she enjoyed the Thanksgiving and Christmas holidays.     Medications:  Current Outpatient Medications:  .   apixaban (ELIQUIS) 2.5 MG TABS tablet, Take 2.5 mg by mouth 2 (two) times daily. , Disp: , Rfl:  .  diltiazem (CARDIZEM SR) 60 MG 12 hr capsule, Take 60 mg by mouth 2 (two) times daily., Disp: , Rfl: 3  Allergies:  Allergies  Allergen Reactions  . Shellfish Allergy     Shrimp    Past Medical History, Surgical history, Social history, and Family History were reviewed and updated.  Review of Systems: Review of Systems  Constitutional: Negative.   HENT: Negative.   Eyes: Negative.   Respiratory: Negative.   Cardiovascular: Negative.   Gastrointestinal: Negative.   Genitourinary: Negative.   Musculoskeletal: Negative.   Skin: Negative.   Neurological: Negative.   Endo/Heme/Allergies: Negative.   Psychiatric/Behavioral: The patient is nervous/anxious.      Physical Exam:  weight is 121 lb (54.9 kg). Her oral temperature is 98.3 F (36.8 C). Her blood pressure is 113/71 and her pulse is 75. Her respiration is 18 and oxygen saturation is 98%.   Physical Exam Vitals signs reviewed.  HENT:     Head: Normocephalic and atraumatic.  Eyes:     Pupils: Pupils are equal, round, and reactive to light.  Neck:     Musculoskeletal: Normal range of motion.  Cardiovascular:     Comments: Irregular rate and rhythm consistent with atrial  fibrillation. The rate is very well controlled. I hear no murmurs rubs or bruits. Pulmonary:     Effort: Pulmonary effort is normal.     Breath sounds: Normal breath sounds.  Abdominal:     General: Bowel sounds are normal.     Palpations: Abdomen is soft.  Musculoskeletal: Normal range of motion.        General: No tenderness or deformity.  Lymphadenopathy:     Cervical: No cervical adenopathy.  Skin:    General: Skin is warm and dry.     Findings: No erythema or rash.  Neurological:     Mental Status: She is alert and oriented to person, place, and time.  Psychiatric:        Behavior: Behavior normal.        Thought Content: Thought content  normal.        Judgment: Judgment normal.    See the  Lab Results  Component Value Date   WBC 5.0 02/14/2018   HGB 14.3 02/14/2018   HCT 45.8 02/14/2018   MCV 96.2 02/14/2018   PLT 251 02/14/2018     Chemistry      Component Value Date/Time   NA 140 01/02/2018 1400   NA 144 10/17/2016 1000   K 4.3 01/02/2018 1400   K 4.5 10/17/2016 1000   CL 105 01/02/2018 1400   CL 106 10/17/2016 1000   CO2 28 01/02/2018 1400   CO2 30 10/17/2016 1000   BUN 20 01/02/2018 1400   BUN 18 10/17/2016 1000   CREATININE 1.09 (H) 01/02/2018 1400   CREATININE 1.40 (H) 11/28/2017 1320   CREATININE 1.6 (H) 10/17/2016 1000      Component Value Date/Time   CALCIUM 9.7 01/02/2018 1400   CALCIUM 9.4 10/17/2016 1000   ALKPHOS 107 01/02/2018 1400   ALKPHOS 101 (H) 10/17/2016 1000   AST 27 01/02/2018 1400   AST 35 11/28/2017 1320   ALT 10 01/02/2018 1400   ALT 13 11/28/2017 1320   ALT 21 10/17/2016 1000   BILITOT 0.6 01/02/2018 1400   BILITOT 0.9 11/28/2017 1320         Impression and Plan: Kathy Massey is 85 year old white female. She has marginal zone lymphoma. She had abdominal disease that is fairly extensive.  She was treated with systemic chemotherapy with Rituxan/Treanda.  She completed all of her treatments including maintenance Rituxan in February 2018.  We now have recurrent disease.  Again, I think that acalabrutinib would be a good idea.  I gave her family information about the acalabrutinib.  Hopefully, we will get her started on acalabrutinib within a week.  She will start off at 100 mg p.o. nightly.  I would like to see her back in another 3 weeks.  I would not do any scans on her probably for about 2-3 months so that she would have a good amount of time being on the acalabrutinib.  I spent about an hour with she and her family.    [name], MD 1/2/20202:40 PM

## 2018-02-15 LAB — IRON AND TIBC
Iron: 170 ug/dL — ABNORMAL HIGH (ref 41–142)
Saturation Ratios: 55 % (ref 21–57)
TIBC: 308 ug/dL (ref 236–444)
UIBC: 138 ug/dL (ref 120–384)

## 2018-02-15 LAB — LACTATE DEHYDROGENASE: LDH: 194 U/L — ABNORMAL HIGH (ref 98–192)

## 2018-02-15 LAB — FERRITIN: Ferritin: 68 ng/mL (ref 11–307)

## 2018-02-21 ENCOUNTER — Telehealth: Payer: Self-pay | Admitting: Cardiology

## 2018-02-21 ENCOUNTER — Encounter: Payer: Self-pay | Admitting: Family

## 2018-02-21 ENCOUNTER — Telehealth: Payer: Self-pay | Admitting: Pharmacist

## 2018-02-21 DIAGNOSIS — C8583 Other specified types of non-Hodgkin lymphoma, intra-abdominal lymph nodes: Secondary | ICD-10-CM

## 2018-02-21 MED ORDER — ACALABRUTINIB 100 MG PO CAPS: 100.0000 mg | ORAL_CAPSULE | Freq: Every day | ORAL | 6 refills | Status: DC

## 2018-02-21 NOTE — Telephone Encounter (Signed)
Spoke to dtr about her MyChart message this morning. dtr is concerned for mom.  Pt been out of rhythm for several days now.  Symptoms experienced include dizziness, shakiness, unsteady, weak. DBP improved today, it is in the 70s. Advised that I would speak w/ AFib clinic in the morning to see if they can see pt tomorrow morning. dtr states she will get her there if they can. [name] aware I will call her in the morning.

## 2018-02-21 NOTE — Addendum Note (Signed)
Addended by: Burney Gauze R on: 02/21/2018 03:03 PM   Modules accepted: Orders

## 2018-02-21 NOTE — Telephone Encounter (Signed)
Daughter [name] sent detailed message via the portal. She was calling to make you aware she sent message.

## 2018-02-21 NOTE — Telephone Encounter (Signed)
Oral Oncology Pharmacist Encounter  Received new prescription for Calquence (acalabrutinib) for the treatment of recurrent small B-cell follicular NHL in conjunction, planned duration until disease progression or unacceptable drug toxicity. Discussed with MD, aware of off label indication and that is may take a while to obtain medication access.  CBC from 02/14/2018 assessed, no relevant lab abnormalities. Prescription dose and frequency assessed. Starting her at a reduce dose due to age.  Current medication list in Epic reviewed, a few DDIs with acalabrutinib identified: - Diltiazem may increase the serum concentration of acalabrutinib. Ms. Hubner will be started on reduced dose, recommend monitoring this interaction. - Acalabrutinib may enhance the anticoagulant effect of anticoagulants, like apixaban. Monitor Ms. Oquinn for signs/symptoms of bleeding.  Prescription has been e-scribed to the [facility] for benefits analysis and approval.  Oral Oncology Clinic will continue to follow for insurance authorization, copayment issues, initial counseling and start date.  Darl Pikes, PharmD, BCPS, [facility] Hematology/Oncology Clinical Pharmacist [facility] [phone]  02/21/2018 4:08 PM

## 2018-02-22 ENCOUNTER — Telehealth: Payer: Self-pay | Admitting: Pharmacy Technician

## 2018-02-22 NOTE — Telephone Encounter (Signed)
Spoke to Roderic Palau, NP in the AFib clinic.  They are unable to see pt today in clinic.  She will have her nurse, Marzetta Board, call and see if they can advise via telephone.  They will determine if OV w/ [facility] next week is needed.  Made them aware we can see pt Mon/Tu if needed. Will forward to [place], RN to call pt

## 2018-02-22 NOTE — Telephone Encounter (Signed)
Attempted to reach dtr.  No answer.  Will try again

## 2018-02-22 NOTE — Telephone Encounter (Signed)
Daughter called back stating her mother called her back saying she felt terrible her BP was 60/50 and was very weak. Instructed daughter if her BP is in the 32Z systolic she should call EMS for transport to ER. Daughter very hesitant to this but agreed if that was the best course of treatment she would call them. Asking for appointment with Dr. Curt Bears on Monday -- will forward to [facility] RN to help with this portion.

## 2018-02-22 NOTE — Telephone Encounter (Signed)
Spoke to dtr to follow up.  She tells me that pt was evaluated by nurse at nursing facility and  BP has normalized.  They are going to continue to monitor for now.   Dtr is going to have pt seen by PCP next week for uti check to ensure issue resolved. She will call back if afib issues arise.  We agreed no appt necessary w/ [facility] at this time.

## 2018-02-22 NOTE — Telephone Encounter (Signed)
Oral Oncology Patient Advocate Encounter  Received notification from [employer] that prior authorization for Calquence is required.  PA submitted on CoverMyMeds Key: [id number] Status is pending  Oral Oncology Clinic will continue to follow.  [place] Patient [place] Phone [phone] Fax [phone] 02/22/2018 10:02 AM

## 2018-02-22 NOTE — Telephone Encounter (Signed)
Talked with patient's daughter - pt is feeling much better today per phone call this morning. BP has normalized. We did talk about the correlation between how patient's feel with UTI with advanced age and her current symptoms were more than likely not related to afib which is permanent for her. Patient reports her HR has been controlled in the 70s when she did have symptoms.  Encouraged staying well hydrated and when she has diastolic blood pressure readings in the 40s to drink extra water to increase blood volume which will hopefully decrease her sensation of dizziness. We agreed an appointment wasn't warranted at this time as pt is back to baseline but she will call when needed as mychart messaging is for non-urgent needs only.

## 2018-02-25 ENCOUNTER — Telehealth: Payer: Self-pay | Admitting: *Deleted

## 2018-02-25 NOTE — Telephone Encounter (Signed)
Message received from patient's daughter, Mary Sella stating that she received a phone call from pt.'s insurance company on Saturday morning stating that [place] has been denied.  Call placed to [facility] and she states that Calquence is now in the appeal process per Nuala Alpha NPH.  Call placed back to patient's daughter [name] to inform her of appeal process in place.  [name] appreciative of call back and has no further questions at this time.

## 2018-02-25 NOTE — Telephone Encounter (Signed)
Oral Oncology Patient Advocate Encounter  Received notification from [employer] that the request for prior authorization for Calquence has been denied due to the medication not being FDA approved for the diagnosis of Follicular lymphoma.    Appeal letter being drafted and will be faxed upon completion to [phone].  This encounter will continue to be updated until final determination.    [place] Patient Kathy Massey Phone [phone] Fax [phone] 02/25/2018 11:14 AM

## 2018-02-26 ENCOUNTER — Encounter: Payer: Self-pay | Admitting: *Deleted

## 2018-02-27 NOTE — Telephone Encounter (Signed)
Oral Chemotherapy Pharmacist Encounter   Appeal letter and supportive information faxed to [employer] for appeal review. Called patient's daughter [name] to give her an update.  Darl Pikes, PharmD, BCPS, [facility] Hematology/Oncology Clinical Pharmacist [facility] Oral [place] [facility] [phone]  02/27/2018 2:18 PM

## 2018-02-28 ENCOUNTER — Ambulatory Visit: Payer: Medicare Other | Admitting: Hematology & Oncology

## 2018-02-28 ENCOUNTER — Other Ambulatory Visit: Payer: Medicare Other

## 2018-03-01 DIAGNOSIS — S0100XA Unspecified open wound of scalp, initial encounter: Secondary | ICD-10-CM | POA: Insufficient documentation

## 2018-03-01 NOTE — Telephone Encounter (Signed)
Oral Chemotherapy Pharmacist Encounter   Received notification that the appeal for Calquence (acalabrutinib) was approved. Called [name], Ms. Heinz's daughter to give her an update.  Copay $100, once we received fully approval from the [place] for  copay assistance we will be able to fill and ship the medication. Likely in 1-2 days.  Darl Pikes, PharmD, BCPS, [facility] Hematology/Oncology Clinical Pharmacist [facility] Oral [facility] [phone]  03/01/2018 2:51 PM

## 2018-03-05 ENCOUNTER — Encounter: Payer: Self-pay | Admitting: Cardiology

## 2018-03-05 ENCOUNTER — Ambulatory Visit: Payer: Medicare Other | Admitting: Cardiology

## 2018-03-05 VITALS — BP 110/64 | HR 88 | Ht 60.0 in | Wt 123.0 lb

## 2018-03-05 DIAGNOSIS — I4819 Other persistent atrial fibrillation: Secondary | ICD-10-CM | POA: Diagnosis not present

## 2018-03-05 MED ORDER — DILTIAZEM HCL ER 60 MG PO CP12: ORAL_CAPSULE | ORAL | 2 refills | Status: DC

## 2018-03-05 MED ORDER — DILTIAZEM HCL 60 MG PO TABS: 60.0000 mg | ORAL_TABLET | Freq: Every day | ORAL | 3 refills | Status: DC

## 2018-03-05 NOTE — Progress Notes (Signed)
Electrophysiology Office Note   Date:  03/05/2018   ID:  Kathy Massey, DOB 1927/04/03, MRN [id number]  PCP:  [name]., MD  Cardiologist:   Primary Electrophysiologist:  Dr Curt Bears    CC: Follow up for atrial fibrillation   History of Present Illness: Kathy Massey is a 85 y.o. female who is being seen today for the evaluation of atrial fibrillation at the request of [name].,*. Presenting today for electrophysiology evaluation. She has a history of paroxysmal atrial fibrillation on Eliquis as well as stress-induced cardiomyopathy.   She presents with her daughter today who reports that two weeks ago the patient was having issues with fatigue and dizziness. Of note, she was being treated for a recurrent UTI at the time. The past week she has felt better. During her episodes of dizziness, she did report some low BP readings. No syncope or falls.  Today, denies symptoms of palpitations, chest pain, shortness of breath, orthopnea, PND, lower extremity edema, claudication, bleeding, or neurologic sequela. The patient is tolerating medications without difficulties.   Past Medical History:  Diagnosis Date  . Arthritis    hands -oa  . Dysrhythmia    afib  . Heart murmur   . Hypertension   . 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 ([place]) 12/11/2013  . Myocardial infarction ([place])    06/15/15: stress induced cardiomyopathy   Past Surgical History:  Procedure Laterality Date  . CARDIAC CATHETERIZATION     06/17/15 [facility]): No CAD, LVEDP 9 mmHg, borderline LV contraction with apical akinesis, suggestive of stress-induced CM. EF > 55% by 06/16/15 echo.  . ELBOW BURSA SURGERY Right 2016  . EYE SURGERY     cataracts removed ,both eyes, /w IOL  . LYMPH NODE BIOPSY Left 01/14/2018   Procedure: EXCISION DEEP LEFT INGUINAL LYMPH NODE BIOPSY ERAS PATHWAY;  Surgeon: Fanny Skates, MD;  Location: [facility];  Service: General;  Laterality: Left;  . TONSILLECTOMY        Current Outpatient Medications  Medication Sig Dispense Refill  . apixaban (ELIQUIS) 2.5 MG TABS tablet Take 2.5 mg by mouth 2 (two) times daily.     . Acalabrutinib 100 MG CAPS Take 100 mg by mouth at bedtime. (Patient not taking: Reported on 03/05/2018) 30 capsule 6  . diltiazem (CARDIZEM) 60 MG tablet Take 1 tablet (60 mg total) by mouth daily. 30 tablet 3   No current facility-administered medications for this visit.     Allergies:   Shellfish allergy   Social History:  The patient  reports that she has never smoked. She has never used smokeless tobacco. She reports current alcohol use of about 1.0 standard drinks of alcohol per week. She reports that she does not use drugs.   Family History:  The patient's family history includes Alcohol abuse in her father and maternal grandfather; Breast cancer in her daughter and paternal grandmother; Depression in her father and mother; Heart attack in her father; Heart disease in her father and mother; Heart failure in her mother; Multiple myeloma in her maternal grandmother.   ROS:  Please see the history of present illness.   Otherwise, review of systems is positive for cough, dizziness.   All other systems are reviewed and negative.   PHYSICAL EXAM: VS:  BP 110/64   Pulse 88   Ht 5' (1.524 m)   Wt 123 lb (55.8 kg)   BMI 24.02 kg/m  , BMI Body mass index is 24.02 kg/m. GEN: Well  nourished, well developed, in no acute distress  HEENT: normal  Neck: no JVD, carotid bruits, or masses Cardiac: irregular; no murmurs, rubs, or gallops,no edema  Respiratory:  clear to auscultation bilaterally, normal work of breathing GI: soft, nontender, nondistended, + BS MS: no deformity or atrophy  Skin: warm and dry Neuro:  Strength and sensation are intact Psych: euthymic mood, full affect  EKG:  EKG is ordered today. Personal review of the ekg ordered shows atrial fibrillation HR 88, QRS 80, QTc 440   Recent Labs: 02/14/2018: ALT 7; BUN 25;  Creatinine 1.18; Hemoglobin 14.3; Platelet Count 251; Potassium 4.3; Sodium 139    Lipid Panel  No results found for: CHOL, TRIG, HDL, CHOLHDL, VLDL, LDLCALC, LDLDIRECT   Wt Readings from Last 3 Encounters:  03/05/18 123 lb (55.8 kg)  02/14/18 121 lb (54.9 kg)  01/14/18 122 lb 1.6 oz (55.4 kg)      Other studies Reviewed: Additional studies/ records that were reviewed today include: TTE 06/16/15  Review of the above records today demonstrates:   Normal left ventricular systolic function, ejection fraction > 83%  Diastolic dysfunction - grade I (normal filling pressures)  Dilated left atrium - mild  Degenerative mitral valve disease  Tricuspid regurgitation - mild  Pulmonic regurgitation - mild  Normal right ventricular systolic function  Dilated right atrium - mild  Cardiac cath 06/15/15 1. No angiographically apparent coronary artery disease 2. Normal LV filling pressures  Holter 02/09/17 - personally reviewed Minimum HR: 48 BPM at 4:09:12 PM Maximum HR: 144 BPM at 9:53:16 PM(2) Average HR: 75 BPM <1% PVCs Predominant rhythm atrial fibrillation  ASSESSMENT AND PLAN:  1.  Permanent atrial fibrillation: Eliquis and diltiazem.  Have elected for a rate control strategy.  She is feeling well today but she has had some fatigue and dizziness with reported hypotension. Kathy Massey decrease diltiazem to 60 mg daily. Patient agrees to bring in BP/HR log to next visit. Continue Eliquis 2.5 mg BID  This patients CHA2DS2-VASc Score and unadjusted Ischemic Stroke Rate (% per year) is equal to 3.2 % stroke rate/year from a score of 3  Above score calculated as 1 point each if present [CHF, HTN, DM, Vascular=MI/PAD/Aortic Plaque, Age if 65-74, or Female] Above score calculated as 2 points each if present [Age > 75, or Stroke/TIA/TE]  2. Stress induced myopathy: Ejection fraction normalized.  No changes.  Current medicines are reviewed at length with the patient today.   The patient  does not have concerns regarding her medicines.  The following changes were made today: decrease diltiazem  Labs/ tests ordered today include:  Orders Placed This Encounter  Procedures  . EKG 12-Lead     Disposition:   FU with Kathy Massey as scheduled   Kathy Capers, PA  03/05/2018 12:51 PM     [facility] [address] [place] [address] [phone] (office) [phone] (fax)   I have seen and examined this patient with Kathy Massey.  Agree with above, note added to reflect my findings.  On exam, iRRR, no murmurs, lungs clear.  Patient presents with weakness and fatigue.  This could be caused by her atrial fibrillation, though she did have a recent UTI and has recently been diagnosed with recurrent Hodgkin's lymphoma.  She is also had some low blood pressures reported.  We Kashae Carstens thus decrease her diltiazem dose.  I Maitland Muhlbauer see her back in 3 months.  Jaquarius Seder M. Velinda Wrobel MD 03/05/2018 1:22 PM

## 2018-03-05 NOTE — Patient Instructions (Addendum)
Medication Instructions:  Your physician has recommended you make the following change in your medication:  1. CHANGE THE WAY YOU TAKE YOUR DILTIAZEM -- DECREASE TO Diltiazem to once daily  * If you need a refill on your cardiac medications before your next appointment, please call your pharmacy.   Labwork: None ordered.  Testing/Procedures: None ordered  Follow-Up: Keep March follow up with Dr. Curt Bears  *Please note that any paperwork needing to be filled out by the provider will need to be addressed at the front desk prior to seeing the provider. Please note that any FMLA, disability or other documents regarding health condition is subject to a $25.00 charge that must be received prior to completion of paperwork in the form of a money order or check.  Thank you for choosing [facility]!!   Trinidad Curet, RN [phone]

## 2018-03-06 ENCOUNTER — Telehealth: Payer: Self-pay | Admitting: Pharmacy Technician

## 2018-03-06 ENCOUNTER — Telehealth: Payer: Self-pay | Admitting: Pharmacist

## 2018-03-06 MED FILL — CALQUENCE 100 MG CAPSULE: 100 | 30 days supply | Qty: 30 | Fill #0

## 2018-03-06 NOTE — Telephone Encounter (Signed)
Scheduled delivery of medication for Thursday 1/23 from [facility]

## 2018-03-06 NOTE — Telephone Encounter (Signed)
Oral Chemotherapy Pharmacist Encounter  Copay assistance was finally approved for Kathy Massey's Calquence. Medication will be delivered tomorrow 03/07/2018 for her to get started.   Patient Education I spoke with patient's daughter [name] for overview of new oral chemotherapy medication: Calquence (acalabrutinib) for the treatment of recurrent small B-cell follicular NHL in conjunction, planned duration until disease progression or unacceptable drug toxicity.   Counseled [name] on administration, dosing, side effects, monitoring, drug-food interactions, safe handling, storage, and disposal. Patient will take 100 mg by mouth at bedtime.  Side effects include but not limited to: decreased wbc/hgb/plt, headache, diarrhea.    Reviewed the importance of keeping a medication schedule and plan for any missed doses.  [name] voiced understanding and appreciation. All questions answered. Medication handout placed in the mail.  Provided [name] with [facility] phone number. She knows to call the office with questions or concerns. [facility] will continue to follow.  Darl Pikes, PharmD, BCPS, [facility] Hematology/Oncology Clinical Pharmacist [facility] Oral [facility] [phone]  03/06/2018 11:22 AM

## 2018-03-06 NOTE — Telephone Encounter (Signed)
Oral Oncology Patient Advocate Encounter   Was successful in securing patient a $5000 grant from [employer] to provide copayment coverage for Calquence.  This will keep the out of pocket expense at $0.     I have spoken with the patient.    The billing information is as follows and has been shared with [place].   RxBin: [id number] PCN:  [id number] Member ID: [id number] Group ID: [id number] Dates of Eligibility: 11/15/17 through 02/13/2019  Bartholomew Crews, [place] [facility] [place] [facility] Phone: [phone] Fax: [phone] 03/06/2018 10:35 AM

## 2018-03-11 ENCOUNTER — Telehealth: Payer: Self-pay | Admitting: Family

## 2018-03-11 NOTE — Telephone Encounter (Signed)
Appointments r/s as requested per Burman Freestone ok to moved out 3 wks due to her just starting her meds on 1/24/ pe r1/27 staff message

## 2018-03-13 ENCOUNTER — Ambulatory Visit: Payer: Medicare Other | Admitting: Hematology & Oncology

## 2018-03-13 ENCOUNTER — Other Ambulatory Visit: Payer: Medicare Other

## 2018-03-20 ENCOUNTER — Other Ambulatory Visit: Payer: Self-pay

## 2018-03-20 DIAGNOSIS — C8583 Other specified types of non-Hodgkin lymphoma, intra-abdominal lymph nodes: Secondary | ICD-10-CM

## 2018-03-20 DIAGNOSIS — C8590 Non-Hodgkin lymphoma, unspecified, unspecified site: Secondary | ICD-10-CM

## 2018-03-20 MED ORDER — APIXABAN 2.5 MG PO TABS: 2.5000 mg | ORAL_TABLET | Freq: Two times a day (BID) | ORAL | 6 refills | Status: DC

## 2018-03-20 NOTE — Telephone Encounter (Signed)
Pt called requesting refills for ELIQUIS 2.5 MG.  Pt daughter states the medication was rejected at the pharmacy because the provider did not approve it. Please address thank you.

## 2018-03-20 NOTE — Telephone Encounter (Signed)
Pt last saw Dr Curt Bears 03/05/18, last labs 02/14/18 Creat 1.18, age 85, weight 55.8, based on specified criteria pt is on appropriate dosage of Eliquis 2.5mg  BID.  Will refill rx.

## 2018-03-29 ENCOUNTER — Encounter: Payer: Self-pay | Admitting: Family

## 2018-03-29 ENCOUNTER — Other Ambulatory Visit: Payer: Self-pay

## 2018-03-29 ENCOUNTER — Inpatient Hospital Stay (HOSPITAL_BASED_OUTPATIENT_CLINIC_OR_DEPARTMENT_OTHER): Payer: Medicare Other | Admitting: Family

## 2018-03-29 ENCOUNTER — Inpatient Hospital Stay: Payer: Medicare Other | Attending: Hematology & Oncology

## 2018-03-29 VITALS — BP 115/75 | HR 100 | Temp 97.5°F | Resp 18 | Wt 121.0 lb

## 2018-03-29 DIAGNOSIS — C8583 Other specified types of non-Hodgkin lymphoma, intra-abdominal lymph nodes: Secondary | ICD-10-CM | POA: Diagnosis present

## 2018-03-29 DIAGNOSIS — Z9221 Personal history of antineoplastic chemotherapy: Secondary | ICD-10-CM | POA: Insufficient documentation

## 2018-03-29 DIAGNOSIS — Z7901 Long term (current) use of anticoagulants: Secondary | ICD-10-CM | POA: Insufficient documentation

## 2018-03-29 DIAGNOSIS — R5383 Other fatigue: Secondary | ICD-10-CM

## 2018-03-29 DIAGNOSIS — Z79899 Other long term (current) drug therapy: Secondary | ICD-10-CM | POA: Diagnosis not present

## 2018-03-29 LAB — CBC WITH DIFFERENTIAL (CANCER CENTER ONLY)
Abs Immature Granulocytes: 0.03 10*3/uL (ref 0.00–0.07)
BASOS PCT: 1 %
Basophils Absolute: 0.1 10*3/uL (ref 0.0–0.1)
Eosinophils Absolute: 0.2 10*3/uL (ref 0.0–0.5)
Eosinophils Relative: 5 %
HCT: 43.6 % (ref 36.0–46.0)
Hemoglobin: 13.8 g/dL (ref 12.0–15.0)
Immature Granulocytes: 1 %
Lymphocytes Relative: 16 %
Lymphs Abs: 0.6 10*3/uL — ABNORMAL LOW (ref 0.7–4.0)
MCH: 31 pg (ref 26.0–34.0)
MCHC: 31.7 g/dL (ref 30.0–36.0)
MCV: 98 fL (ref 80.0–100.0)
Monocytes Absolute: 0.3 10*3/uL (ref 0.1–1.0)
Monocytes Relative: 7 %
Neutro Abs: 2.5 10*3/uL (ref 1.7–7.7)
Neutrophils Relative %: 70 %
Platelet Count: 211 10*3/uL (ref 150–400)
RBC: 4.45 MIL/uL (ref 3.87–5.11)
RDW: 13.9 % (ref 11.5–15.5)
WBC Count: 3.6 10*3/uL — ABNORMAL LOW (ref 4.0–10.5)
nRBC: 0 % (ref 0.0–0.2)

## 2018-03-29 LAB — CMP (CANCER CENTER ONLY)
ALT: 7 U/L (ref 0–44)
AST: 22 U/L (ref 15–41)
Albumin: 4.2 g/dL (ref 3.5–5.0)
Alkaline Phosphatase: 121 U/L (ref 38–126)
Anion gap: 8 (ref 5–15)
BUN: 22 mg/dL (ref 8–23)
CO2: 28 mmol/L (ref 22–32)
Calcium: 9.6 mg/dL (ref 8.9–10.3)
Chloride: 106 mmol/L (ref 98–111)
Creatinine: 1.22 mg/dL — ABNORMAL HIGH (ref 0.44–1.00)
GFR, Est AFR Am: 46 mL/min — ABNORMAL LOW (ref >60)
GFR, Estimated: 40 mL/min — ABNORMAL LOW (ref >60)
Glucose, Bld: 88 mg/dL (ref 70–99)
Potassium: 4.3 mmol/L (ref 3.5–5.1)
Sodium: 142 mmol/L (ref 135–145)
Total Bilirubin: 0.5 mg/dL (ref 0.3–1.2)
Total Protein: 6.1 g/dL — ABNORMAL LOW (ref 6.5–8.1)

## 2018-03-29 LAB — LACTATE DEHYDROGENASE: LDH: 181 U/L (ref 98–192)

## 2018-03-29 LAB — SAVE SMEAR(SSMR), FOR PROVIDER SLIDE REVIEW

## 2018-03-29 NOTE — Progress Notes (Signed)
Hematology and Oncology Follow Up Visit  Kathy Massey [id number] 07-26-1927 85 y.o. 03/29/2018   Principle Diagnosis:  Recurrent small B-cell follicular NHL  Current Therapy:   Acalabrutinib 100 mg po q day - started on 02/25/2018   Interim History:  Kathy Massey is here today with her daughter for follow-up. She is doing well but has had some mild fatigue. She is walking daily for exercise.  She has noticed that her appetite is a little down but she her weight remains stable.  She verbalized that she is taking the Acalabrutinib at bedtime as prescribed. She is tolerating this well so far and her WBC, Hgb and platelets are stable.  She is followed closely by cardiology for atrial fib and recently had her Cardizem decreased to 60 mg PO daily due to hypotension.  She notes occasional dizziness and palpitations with the atrial fib.  She is doing well on Eliquis and has had no episodes of bleeding. She does bruise easily.  No fever, chills, n/v, cough, rash, chest pain, abdominal pain or changes in bowel or bladder habits.  She has 1 episode of diarrhea every couple of days but states that has been going on for a long time prior to starting the Acalabrutinib.  No swelling, tenderness, numbness or tingling in her extremities at this time.  No lymphadenopathy noted on exam.  She admits that she needs to better hydrate.   ECOG Performance Status: 1 - Symptomatic but completely ambulatory  Medications:  Allergies as of 03/29/2018      Reactions   Shellfish Allergy Other (See Comments)   Shrimp Shrimp      Medication List       Accurate as of March 29, 2018 11:08 AM. Always use your most recent med list.        Acalabrutinib 100 MG Caps Take 100 mg by mouth at bedtime.   apixaban 2.5 MG Tabs tablet Commonly known as:  ELIQUIS Take 1 tablet (2.5 mg total) by mouth 2 (two) times daily.   diltiazem 60 MG 12 hr capsule Commonly known as:  CARDIZEM SR Take 1 tablet (60 mg  total) once daily.  You may take a second tablet daily if needed for blood pressure and/or heart rate.       Allergies:  Allergies  Allergen Reactions  . Shellfish Allergy Other (See Comments)    Shrimp Shrimp    Past Medical History, Surgical history, Social history, and Family History were reviewed and updated.  Review of Systems: All other 10 point review of systems is negative.   Physical Exam:  vitals were not taken for this visit.   Wt Readings from Last 3 Encounters:  03/05/18 123 lb (55.8 kg)  02/14/18 121 lb (54.9 kg)  01/14/18 122 lb 1.6 oz (55.4 kg)    Ocular: Sclerae unicteric, pupils equal, round and reactive to light Ear-nose-throat: Oropharynx clear, dentition fair Lymphatic: No cervical, supraclavicular or axillary adenopathy Lungs no rales or rhonchi, good excursion bilaterally Heart regular rate and rhythm, no murmur appreciated Abd soft, nontender, positive bowel sounds, no liver or spleen tip palpated on exam, no fluid wave  MSK no focal spinal tenderness, no joint edema Neuro: non-focal, well-oriented, appropriate affect Breasts: Deferred   Lab Results  Component Value Date   WBC 5.0 02/14/2018   HGB 14.3 02/14/2018   HCT 45.8 02/14/2018   MCV 96.2 02/14/2018   PLT 251 02/14/2018   Lab Results  Component Value Date   FERRITIN 68 02/14/2018  IRON 170 (H) 02/14/2018   TIBC 308 02/14/2018   UIBC 138 02/14/2018   IRONPCTSAT 55 02/14/2018   Lab Results  Component Value Date   RBC 4.76 02/14/2018   No results found for: KPAFRELGTCHN, LAMBDASER, KAPLAMBRATIO No results found for: IGGSERUM, IGA, IGMSERUM No results found for: Odetta Pink, SPEI   Chemistry      Component Value Date/Time   NA 139 02/14/2018 1359   NA 144 10/17/2016 1000   K 4.3 02/14/2018 1359   K 4.5 10/17/2016 1000   CL 104 02/14/2018 1359   CL 106 10/17/2016 1000   CO2 28 02/14/2018 1359   CO2 30 10/17/2016 1000    BUN 25 (H) 02/14/2018 1359   BUN 18 10/17/2016 1000   CREATININE 1.18 (H) 02/14/2018 1359   CREATININE 1.6 (H) 10/17/2016 1000      Component Value Date/Time   CALCIUM 9.4 02/14/2018 1359   CALCIUM 9.4 10/17/2016 1000   ALKPHOS 123 02/14/2018 1359   ALKPHOS 101 (H) 10/17/2016 1000   AST 21 02/14/2018 1359   ALT 7 02/14/2018 1359   ALT 21 10/17/2016 1000   BILITOT 0.7 02/14/2018 1359       Impression and Plan: Kathy Massey is 85 yo white female with recurrent small B-cell follicular NHL. She was originally treated with systemic chemotherapy with Rituxan/Treanda followed by maintenance Rituxan completed in February 2018. She is now on Acalabrutinib 100 mg PO QHS.  She is doing well and tolerating this treatment nicely. She will continue her same regimen per Dr. Arelia Sneddon and I have let the pharmacy downstairs know there willl be no changes.  We will plan to see her back in another month for MD follow-up.  She will contact our office with any questions or concerns. We can certainly see her sooner if need be.   [name], NP 2/14/202011:08 AM

## 2018-03-30 LAB — IGG, IGA, IGM
IgA: 62 mg/dL — ABNORMAL LOW (ref 64–422)
IgG (Immunoglobin G), Serum: 608 mg/dL — ABNORMAL LOW (ref 700–1600)
IgM (Immunoglobulin M), Srm: 39 mg/dL (ref 26–217)

## 2018-03-31 LAB — BETA 2 MICROGLOBULIN, SERUM: Beta-2 Microglobulin: 3 mg/L — ABNORMAL HIGH (ref 0.6–2.4)

## 2018-04-02 MED FILL — CALQUENCE 100 MG CAPSULE: 100 | 30 days supply | Qty: 30 | Fill #1

## 2018-04-30 ENCOUNTER — Inpatient Hospital Stay: Payer: Medicare Other

## 2018-04-30 ENCOUNTER — Inpatient Hospital Stay: Payer: Medicare Other | Attending: Hematology & Oncology | Admitting: Hematology & Oncology

## 2018-04-30 ENCOUNTER — Other Ambulatory Visit: Payer: Self-pay

## 2018-04-30 ENCOUNTER — Encounter: Payer: Self-pay | Admitting: Hematology & Oncology

## 2018-04-30 VITALS — BP 110/70 | HR 90 | Temp 98.0°F | Resp 16

## 2018-04-30 DIAGNOSIS — C8583 Other specified types of non-Hodgkin lymphoma, intra-abdominal lymph nodes: Secondary | ICD-10-CM | POA: Insufficient documentation

## 2018-04-30 DIAGNOSIS — I4891 Unspecified atrial fibrillation: Secondary | ICD-10-CM | POA: Insufficient documentation

## 2018-04-30 DIAGNOSIS — Z9221 Personal history of antineoplastic chemotherapy: Secondary | ICD-10-CM | POA: Insufficient documentation

## 2018-04-30 DIAGNOSIS — R1900 Intra-abdominal and pelvic swelling, mass and lump, unspecified site: Secondary | ICD-10-CM | POA: Diagnosis not present

## 2018-04-30 LAB — CBC WITH DIFFERENTIAL (CANCER CENTER ONLY)
Abs Immature Granulocytes: 0 10*3/uL (ref 0.00–0.07)
BASOS ABS: 0 10*3/uL (ref 0.0–0.1)
Basophils Relative: 1 %
Eosinophils Absolute: 0.1 10*3/uL (ref 0.0–0.5)
Eosinophils Relative: 4 %
HCT: 44.4 % (ref 36.0–46.0)
Hemoglobin: 13.9 g/dL (ref 12.0–15.0)
IMMATURE GRANULOCYTES: 0 %
Lymphocytes Relative: 16 %
Lymphs Abs: 0.7 10*3/uL (ref 0.7–4.0)
MCH: 30.8 pg (ref 26.0–34.0)
MCHC: 31.3 g/dL (ref 30.0–36.0)
MCV: 98.4 fL (ref 80.0–100.0)
Monocytes Absolute: 0.3 10*3/uL (ref 0.1–1.0)
Monocytes Relative: 7 %
Neutro Abs: 2.9 10*3/uL (ref 1.7–7.7)
Neutrophils Relative %: 72 %
Platelet Count: 195 10*3/uL (ref 150–400)
RBC: 4.51 MIL/uL (ref 3.87–5.11)
RDW: 13.9 % (ref 11.5–15.5)
WBC Count: 4 10*3/uL (ref 4.0–10.5)
nRBC: 0 % (ref 0.0–0.2)

## 2018-04-30 LAB — CMP (CANCER CENTER ONLY)
ALT: 8 U/L (ref 0–44)
AST: 23 U/L (ref 15–41)
Albumin: 4.4 g/dL (ref 3.5–5.0)
Alkaline Phosphatase: 129 U/L — ABNORMAL HIGH (ref 38–126)
Anion gap: 7 (ref 5–15)
BUN: 21 mg/dL (ref 8–23)
CO2: 27 mmol/L (ref 22–32)
Calcium: 9.5 mg/dL (ref 8.9–10.3)
Chloride: 108 mmol/L (ref 98–111)
Creatinine: 1.12 mg/dL — ABNORMAL HIGH (ref 0.44–1.00)
GFR, Est AFR Am: 51 mL/min — ABNORMAL LOW (ref >60)
GFR, Estimated: 44 mL/min — ABNORMAL LOW (ref >60)
Glucose, Bld: 105 mg/dL — ABNORMAL HIGH (ref 70–99)
POTASSIUM: 4.7 mmol/L (ref 3.5–5.1)
SODIUM: 142 mmol/L (ref 135–145)
Total Bilirubin: 0.5 mg/dL (ref 0.3–1.2)
Total Protein: 6.7 g/dL (ref 6.5–8.1)

## 2018-04-30 LAB — SAVE SMEAR(SSMR), FOR PROVIDER SLIDE REVIEW

## 2018-04-30 LAB — LACTATE DEHYDROGENASE: LDH: 201 U/L — ABNORMAL HIGH (ref 98–192)

## 2018-04-30 NOTE — Progress Notes (Signed)
Hematology and Oncology Follow Up Visit  Kathy Massey [id number] November 26, 1927 85 y.o. 04/30/2018   Principle Diagnosis:  Recurrent small B-cell follicular NHL  Current Therapy:   Acalabrutinib 100 mg po q day - started on 02/25/2018   Interim History:  Kathy Massey is here today with her daughter for follow-up.  She is doing pretty well.  She is at the assisted living center.  She has more in the way of memory issues.  However, she seems to be managing.  She has had no issues with nausea or vomiting.  She has had no abdominal pain.  She has noted a little bit of abdominal swelling.  I think this might be from some weight gain.  I do think this reflects lymphoma.  She has had no fever.  She has had no bleeding.  There is been no change in bowel or bladder habits.  She is had no cardiac issues.  She does have atrial fibrillation.  I think she sees a cardiologist next week.  Overall, her performance status is ECOG 2    Medications:  Allergies as of 04/30/2018      Reactions   Shellfish Allergy Other (See Comments)   Shrimp Shrimp      Medication List       Accurate as of April 30, 2018  1:04 PM. Always use your most recent med list.        Acalabrutinib 100 MG Caps Take 100 mg by mouth at bedtime.   apixaban 2.5 MG Tabs tablet Commonly known as:  Eliquis Take 1 tablet (2.5 mg total) by mouth 2 (two) times daily.   diltiazem 60 MG 12 hr capsule Commonly known as:  CARDIZEM SR Take 1 tablet (60 mg total) once daily.  You may take a second tablet daily if needed for blood pressure and/or heart rate.       Allergies:  Allergies  Allergen Reactions  . Shellfish Allergy Other (See Comments)    Shrimp Shrimp    Past Medical History, Surgical history, Social history, and Family History were reviewed and updated.  Review of Systems: Review of Systems  Constitutional: Negative.   HENT: Negative.   Respiratory: Negative.   Cardiovascular: Positive for palpitations.   Gastrointestinal: Negative.   Genitourinary: Negative.   Musculoskeletal: Negative.   Skin: Negative.   Neurological: Negative.   Endo/Heme/Allergies: Negative.   Psychiatric/Behavioral: Negative.       Physical Exam:  oral temperature is 98 F (36.7 C). Her blood pressure is 110/70 and her pulse is 90. Her respiration is 16 and oxygen saturation is 100%.   Wt Readings from Last 3 Encounters:  03/29/18 121 lb (54.9 kg)  03/05/18 123 lb (55.8 kg)  02/14/18 121 lb (54.9 kg)    Physical Exam Vitals signs reviewed.  HENT:     Head: Normocephalic and atraumatic.  Eyes:     Pupils: Pupils are equal, round, and reactive to light.  Neck:     Musculoskeletal: Normal range of motion.  Cardiovascular:     Rate and Rhythm: Normal rate and regular rhythm.     Heart sounds: Normal heart sounds.     Comments: Cardiac exam shows an irregular rate and rhythm consistent with atrial fibrillation.  The rate is well controlled.  There are no murmurs, rubs or bruits. Pulmonary:     Effort: Pulmonary effort is normal.     Breath sounds: Normal breath sounds.  Abdominal:     General: Bowel sounds are normal.  Palpations: Abdomen is soft.     Comments: Soft abdomen with good bowel sounds.  There is no fluid wave.  There is no guarding or rebound tenderness.  She has no palpable inguinal adenopathy.  There is no liver or spleen tip that is palpable.  Musculoskeletal: Normal range of motion.        General: No tenderness or deformity.  Lymphadenopathy:     Cervical: No cervical adenopathy.  Skin:    General: Skin is warm and dry.     Findings: No erythema or rash.  Neurological:     Mental Status: She is alert and oriented to person, place, and time.  Psychiatric:        Behavior: Behavior normal.        Thought Content: Thought content normal.        Judgment: Judgment normal.      Lab Results  Component Value Date   WBC 4.0 04/30/2018   HGB 13.9 04/30/2018   HCT 44.4  04/30/2018   MCV 98.4 04/30/2018   PLT 195 04/30/2018   Lab Results  Component Value Date   FERRITIN 68 02/14/2018   IRON 170 (H) 02/14/2018   TIBC 308 02/14/2018   UIBC 138 02/14/2018   IRONPCTSAT 55 02/14/2018   Lab Results  Component Value Date   RBC 4.51 04/30/2018   No results found for: KPAFRELGTCHN, LAMBDASER, KAPLAMBRATIO Lab Results  Component Value Date   IGGSERUM 608 (L) 03/29/2018   IGA 62 (L) 03/29/2018   IGMSERUM 39 03/29/2018   No results found for: Odetta Pink, SPEI   Chemistry      Component Value Date/Time   NA 142 04/30/2018 1205   NA 144 10/17/2016 1000   K 4.7 04/30/2018 1205   K 4.5 10/17/2016 1000   CL 108 04/30/2018 1205   CL 106 10/17/2016 1000   CO2 27 04/30/2018 1205   CO2 30 10/17/2016 1000   BUN 21 04/30/2018 1205   BUN 18 10/17/2016 1000   CREATININE 1.12 (H) 04/30/2018 1205   CREATININE 1.6 (H) 10/17/2016 1000      Component Value Date/Time   CALCIUM 9.5 04/30/2018 1205   CALCIUM 9.4 10/17/2016 1000   ALKPHOS 129 (H) 04/30/2018 1205   ALKPHOS 101 (H) 10/17/2016 1000   AST 23 04/30/2018 1205   ALT 8 04/30/2018 1205   ALT 21 10/17/2016 1000   BILITOT 0.5 04/30/2018 1205       Impression and Plan: Kathy Massey is 85 yo white female with recurrent small B-cell follicular NHL. She was originally treated with systemic chemotherapy with Rituxan/Treanda followed by maintenance Rituxan completed in February 2018.  She is now on Acalabrutinib 100 mg PO QHS.   Since she has been on acalabrutinib now for almost 3 months, I think it is time that we recheck her PET scan.  I will set this up for early April.  Overall, I really think that she is done incredibly well.  I am very pleased with her tolerability of the acalabrutinib.  I will plan to see her back in a week or so after her PET scan is done.   [name], MD 3/17/20201:04 PM

## 2018-05-01 LAB — IGG, IGA, IGM
IGG (IMMUNOGLOBIN G), SERUM: 614 mg/dL — AB (ref 700–1600)
IgA: 65 mg/dL (ref 64–422)
IgM (Immunoglobulin M), Srm: 36 mg/dL (ref 26–217)

## 2018-05-01 LAB — BETA 2 MICROGLOBULIN, SERUM: Beta-2 Microglobulin: 3 mg/L — ABNORMAL HIGH (ref 0.6–2.4)

## 2018-05-02 MED FILL — CALQUENCE 100 MG CAPSULE: 100 | 30 days supply | Qty: 30 | Fill #2

## 2018-05-07 ENCOUNTER — Ambulatory Visit: Payer: Medicare Other | Admitting: Cardiology

## 2018-05-23 ENCOUNTER — Telehealth: Payer: Self-pay | Admitting: *Deleted

## 2018-05-23 NOTE — Telephone Encounter (Signed)
Call received from patient's daughter requesting to move out pt.'s scheduled PET scan and MD appt d/t corona virus and restrictions at pt.'s facility, [facility].  Informed pt.'s daughter that Dr. Marin Olp is out of the office today and tomorrow and that we would call her back next week with Dr. Antonieta Pert recommendation.

## 2018-05-27 MED FILL — CALQUENCE 100 MG CAPSULE: 100 | 30 days supply | Qty: 30 | Fill #3

## 2018-06-04 ENCOUNTER — Encounter (HOSPITAL_COMMUNITY): Payer: Medicare Other

## 2018-06-10 ENCOUNTER — Telehealth: Payer: Self-pay | Admitting: *Deleted

## 2018-06-10 NOTE — Telephone Encounter (Signed)
Call received from patient's daughter Kathy Massey requesting to have PET scan and appts scheduled for 06/26/18 moved out if possible d/t coronavirus concerns.  Dr. Marin Olp notified and order received to move appts out one month.  Call placed back to patient's daughter to inform her of MD orders.  Message sent to scheduling.

## 2018-06-11 ENCOUNTER — Telehealth: Payer: Self-pay | Admitting: Hematology & Oncology

## 2018-06-11 ENCOUNTER — Other Ambulatory Visit: Payer: Medicare Other

## 2018-06-11 ENCOUNTER — Ambulatory Visit: Payer: Medicare Other | Admitting: Hematology & Oncology

## 2018-06-11 NOTE — Telephone Encounter (Signed)
Called and spoke with patients daughter she was given all details for PET Scan and follow up visits for 6/23 per 4/27 staff message

## 2018-06-24 ENCOUNTER — Encounter (HOSPITAL_COMMUNITY): Payer: Medicare Other

## 2018-06-24 MED FILL — CALQUENCE 100 MG CAPSULE: 100 | 30 days supply | Qty: 30 | Fill #4

## 2018-06-26 ENCOUNTER — Ambulatory Visit: Payer: Medicare Other | Admitting: Hematology & Oncology

## 2018-06-26 ENCOUNTER — Other Ambulatory Visit: Payer: Medicare Other

## 2018-06-26 ENCOUNTER — Ambulatory Visit (HOSPITAL_COMMUNITY): Payer: Medicare Other

## 2018-07-22 MED FILL — CALQUENCE 100 MG CAPSULE: 100 | 30 days supply | Qty: 30 | Fill #5

## 2018-07-29 ENCOUNTER — Other Ambulatory Visit: Payer: Medicare Other

## 2018-07-29 ENCOUNTER — Ambulatory Visit: Payer: Medicare Other | Admitting: Hematology & Oncology

## 2018-08-06 ENCOUNTER — Ambulatory Visit (HOSPITAL_COMMUNITY)
Admission: RE | Admit: 2018-08-06 | Discharge: 2018-08-06 | Disposition: A | Discharge status: Home / Self Care | Payer: Medicare Other | Source: Ambulatory Visit | Attending: Hematology & Oncology | Admitting: Hematology & Oncology

## 2018-08-06 ENCOUNTER — Other Ambulatory Visit: Payer: Self-pay

## 2018-08-06 ENCOUNTER — Ambulatory Visit: Payer: Medicare Other | Admitting: Hematology & Oncology

## 2018-08-06 ENCOUNTER — Other Ambulatory Visit: Payer: Medicare Other

## 2018-08-06 DIAGNOSIS — Z79899 Other long term (current) drug therapy: Secondary | ICD-10-CM | POA: Diagnosis not present

## 2018-08-06 DIAGNOSIS — C8583 Other specified types of non-Hodgkin lymphoma, intra-abdominal lymph nodes: Secondary | ICD-10-CM | POA: Diagnosis present

## 2018-08-06 DIAGNOSIS — Z7901 Long term (current) use of anticoagulants: Secondary | ICD-10-CM | POA: Diagnosis not present

## 2018-08-06 LAB — GLUCOSE, CAPILLARY: Glucose-Capillary: 80 mg/dL (ref 70–99)

## 2018-08-06 MED ORDER — FLUDEOXYGLUCOSE F - 18 (FDG) INJECTION
5.9600 | Freq: Once | INTRAVENOUS | Status: AC | PRN
  Administered 2018-08-06: 5.96 via INTRAVENOUS

## 2018-08-08 ENCOUNTER — Telehealth: Payer: Self-pay | Admitting: *Deleted

## 2018-08-08 NOTE — Telephone Encounter (Signed)
-----   Message from [name], MD sent at 08/07/2018  5:18 PM EDT ----- Call - everything looks stable.  No new lymph nodes.  pete

## 2018-08-08 NOTE — Telephone Encounter (Signed)
Notified pt Daughter  Sella of pt's PET scan results. [name] verbalized understanding. No further concerns at this time.

## 2018-08-09 ENCOUNTER — Inpatient Hospital Stay: Payer: Medicare Other | Attending: Hematology & Oncology

## 2018-08-09 ENCOUNTER — Other Ambulatory Visit: Payer: Self-pay

## 2018-08-09 ENCOUNTER — Inpatient Hospital Stay (HOSPITAL_BASED_OUTPATIENT_CLINIC_OR_DEPARTMENT_OTHER): Payer: Medicare Other | Admitting: Hematology & Oncology

## 2018-08-09 ENCOUNTER — Encounter: Payer: Self-pay | Admitting: Hematology & Oncology

## 2018-08-09 VITALS — BP 106/71 | HR 84 | Temp 98.0°F | Resp 20

## 2018-08-09 DIAGNOSIS — C8583 Other specified types of non-Hodgkin lymphoma, intra-abdominal lymph nodes: Secondary | ICD-10-CM | POA: Diagnosis present

## 2018-08-09 DIAGNOSIS — Z9221 Personal history of antineoplastic chemotherapy: Secondary | ICD-10-CM

## 2018-08-09 DIAGNOSIS — Z79899 Other long term (current) drug therapy: Secondary | ICD-10-CM

## 2018-08-09 DIAGNOSIS — R413 Other amnesia: Secondary | ICD-10-CM | POA: Insufficient documentation

## 2018-08-09 LAB — CBC WITH DIFFERENTIAL (CANCER CENTER ONLY)
Abs Immature Granulocytes: 0.02 10*3/uL (ref 0.00–0.07)
Basophils Absolute: 0.1 10*3/uL (ref 0.0–0.1)
Basophils Relative: 1 %
Eosinophils Absolute: 0.2 10*3/uL (ref 0.0–0.5)
Eosinophils Relative: 4 %
HCT: 45.3 % (ref 36.0–46.0)
Hemoglobin: 14.4 g/dL (ref 12.0–15.0)
Immature Granulocytes: 0 %
Lymphocytes Relative: 17 %
Lymphs Abs: 0.8 10*3/uL (ref 0.7–4.0)
MCH: 30.2 pg (ref 26.0–34.0)
MCHC: 31.8 g/dL (ref 30.0–36.0)
MCV: 95 fL (ref 80.0–100.0)
Monocytes Absolute: 0.5 10*3/uL (ref 0.1–1.0)
Monocytes Relative: 10 %
Neutro Abs: 3.4 10*3/uL (ref 1.7–7.7)
Neutrophils Relative %: 68 %
Platelet Count: 191 10*3/uL (ref 150–400)
RBC: 4.77 MIL/uL (ref 3.87–5.11)
RDW: 13.3 % (ref 11.5–15.5)
WBC Count: 5 10*3/uL (ref 4.0–10.5)
nRBC: 0 % (ref 0.0–0.2)

## 2018-08-09 LAB — CMP (CANCER CENTER ONLY)
ALT: 6 U/L (ref 0–44)
AST: 20 U/L (ref 15–41)
Albumin: 4.1 g/dL (ref 3.5–5.0)
Alkaline Phosphatase: 102 U/L (ref 38–126)
Anion gap: 8 (ref 5–15)
BUN: 22 mg/dL (ref 8–23)
CO2: 29 mmol/L (ref 22–32)
Calcium: 9.7 mg/dL (ref 8.9–10.3)
Chloride: 107 mmol/L (ref 98–111)
Creatinine: 1.29 mg/dL — ABNORMAL HIGH (ref 0.44–1.00)
GFR, Est AFR Am: 43 mL/min — ABNORMAL LOW (ref >60)
GFR, Estimated: 37 mL/min — ABNORMAL LOW (ref >60)
Glucose, Bld: 70 mg/dL (ref 70–99)
Potassium: 4.7 mmol/L (ref 3.5–5.1)
Sodium: 144 mmol/L (ref 135–145)
Total Bilirubin: 0.8 mg/dL (ref 0.3–1.2)
Total Protein: 6.2 g/dL — ABNORMAL LOW (ref 6.5–8.1)

## 2018-08-09 NOTE — Progress Notes (Signed)
Hematology and Oncology Follow Up Visit  Kathy Massey [id number] [birth year] 85 y.o. 08/09/2018   Principle Diagnosis:  Recurrent small B-cell follicular NHL  Current Therapy:   Acalabrutinib 100 mg po q day - started on 02/25/2018   Interim History:  Kathy Massey is here today with her daughter for follow-up.  She looks pretty good.  According to her daughter, she is having some memory difficulties.  We looked up the side effects of acalabrutinib.  I cannot see anything in regards to memory loss.  We did repeat her PET scan.  Overall, the PET scan looked stable.  She still has some active lymph nodes.  Most were smaller in size.  Some were stable in size.  She is tolerating the acalabrutinib well.  She is only on the 100 mg daily dose.  I think this is adequate for her given her overall age and performance status.  She is still at the assisted living facility.  They really have been cautious with their residents.  As such, she is not really exposed to the coronavirus.  Her family is going to the beach starting next weekend.  I will see any problems with Ms. Loor going.  Her family is very conscientious of her and her risk of getting the coronavirus.  Overall, her performance status is ECOG 2    Medications:  Allergies as of 08/09/2018      Reactions   Shellfish Allergy Other (See Comments)   Shrimp Shrimp      Medication List       Accurate as of August 09, 2018 12:27 PM. If you have any questions, ask your nurse or doctor.        Acalabrutinib 100 MG Caps Take 100 mg by mouth at bedtime.   apixaban 2.5 MG Tabs tablet Commonly known as: Eliquis Take 1 tablet (2.5 mg total) by mouth 2 (two) times daily.   diltiazem 60 MG 12 hr capsule Commonly known as: CARDIZEM SR Take 1 tablet (60 mg total) once daily.  You may take a second tablet daily if needed for blood pressure and/or heart rate.       Allergies:  Allergies  Allergen Reactions  . Shellfish Allergy Other  (See Comments)    Shrimp Shrimp    Past Medical History, Surgical history, Social history, and Family History were reviewed and updated.  Review of Systems: Review of Systems  Constitutional: Negative.   HENT: Negative.   Respiratory: Negative.   Cardiovascular: Positive for palpitations.  Gastrointestinal: Negative.   Genitourinary: Negative.   Musculoskeletal: Negative.   Skin: Negative.   Neurological: Negative.   Endo/Heme/Allergies: Negative.   Psychiatric/Behavioral: Negative.       Physical Exam:  oral temperature is 98 F (36.7 C). Her blood pressure is 106/71 and her pulse is 84. Her respiration is 20 and oxygen saturation is 100%.   Wt Readings from Last 3 Encounters:  03/29/18 121 lb (54.9 kg)  03/05/18 123 lb (55.8 kg)  02/14/18 121 lb (54.9 kg)    Physical Exam Vitals signs reviewed.  HENT:     Head: Normocephalic and atraumatic.  Eyes:     Pupils: Pupils are equal, round, and reactive to light.  Neck:     Musculoskeletal: Normal range of motion.  Cardiovascular:     Rate and Rhythm: Normal rate and regular rhythm.     Heart sounds: Normal heart sounds.     Comments: Cardiac exam shows an irregular rate and rhythm consistent with  atrial fibrillation.  The rate is well controlled.  There are no murmurs, rubs or bruits. Pulmonary:     Effort: Pulmonary effort is normal.     Breath sounds: Normal breath sounds.  Abdominal:     General: Bowel sounds are normal.     Palpations: Abdomen is soft.     Comments: Soft abdomen with good bowel sounds.  There is no fluid wave.  There is no guarding or rebound tenderness.  She has no palpable inguinal adenopathy.  There is no liver or spleen tip that is palpable.  Musculoskeletal: Normal range of motion.        General: No tenderness or deformity.  Lymphadenopathy:     Cervical: No cervical adenopathy.  Skin:    General: Skin is warm and dry.     Findings: No erythema or rash.  Neurological:     Mental  Status: She is alert and oriented to person, place, and time.  Psychiatric:        Behavior: Behavior normal.        Thought Content: Thought content normal.        Judgment: Judgment normal.      Lab Results  Component Value Date   WBC 5.0 08/09/2018   HGB 14.4 08/09/2018   HCT 45.3 08/09/2018   MCV 95.0 08/09/2018   PLT 191 08/09/2018   Lab Results  Component Value Date   FERRITIN 68 02/14/2018   IRON 170 (H) 02/14/2018   TIBC 308 02/14/2018   UIBC 138 02/14/2018   IRONPCTSAT 55 02/14/2018   Lab Results  Component Value Date   RBC 4.77 08/09/2018   No results found for: KPAFRELGTCHN, LAMBDASER, KAPLAMBRATIO Lab Results  Component Value Date   IGGSERUM 614 (L) 04/30/2018   IGA 65 04/30/2018   IGMSERUM 36 04/30/2018   No results found for: Odetta Pink, SPEI   Chemistry      Component Value Date/Time   NA 142 04/30/2018 1205   NA 144 10/17/2016 1000   K 4.7 04/30/2018 1205   K 4.5 10/17/2016 1000   CL 108 04/30/2018 1205   CL 106 10/17/2016 1000   CO2 27 04/30/2018 1205   CO2 30 10/17/2016 1000   BUN 21 04/30/2018 1205   BUN 18 10/17/2016 1000   CREATININE 1.12 (H) 04/30/2018 1205   CREATININE 1.6 (H) 10/17/2016 1000      Component Value Date/Time   CALCIUM 9.5 04/30/2018 1205   CALCIUM 9.4 10/17/2016 1000   ALKPHOS 129 (H) 04/30/2018 1205   ALKPHOS 101 (H) 10/17/2016 1000   AST 23 04/30/2018 1205   ALT 8 04/30/2018 1205   ALT 21 10/17/2016 1000   BILITOT 0.5 04/30/2018 1205       Impression and Plan: Kathy Massey is 85 yo white female with recurrent small B-cell follicular NHL. She was originally treated with systemic chemotherapy with Rituxan/Treanda followed by maintenance Rituxan completed in February 2018.  She is now on Acalabrutinib 100 mg PO QHS.   I will plan to repeat a PET scan on her in late August.  I think this would be very reasonable.Marland Kitchen  Hopefully we will see that she is responding  to release has stable disease.  At 85 years old, our goal clearly is her quality of life.  I have to believe that cardiac issues will get her longer for lymphoma will.  [name], MD 6/26/202012:27 PM

## 2018-08-12 LAB — LACTATE DEHYDROGENASE: LDH: 168 U/L (ref 98–192)

## 2018-08-13 MED FILL — CALQUENCE 100 MG CAPSULE: 100 | 30 days supply | Qty: 30 | Fill #6

## 2018-08-28 ENCOUNTER — Telehealth: Payer: Self-pay | Admitting: Cardiology

## 2018-08-28 NOTE — Telephone Encounter (Signed)
New Message        COVID-19 Pre-Screening Questions:   In the past 7 to 10 days have you had a cough,  shortness of breath, headache, congestion, fever (100 or greater) body aches, chills, sore throat, or sudden loss of taste or sense of smell? NO  Have you been around anyone with known Covid 19. NO  Have you been around anyone who is awaiting Covid 19 test results in the past 7 to 10 days? NO  Have you been around anyone who has been exposed to Covid 19, or has mentioned symptoms of Covid 19 within the past 7 to 10 days? NO Pts daughter will be assisting pt for medical reasons. She answered NO to all questions   If you have any concerns/questions about symptoms patients report during screening (either on the phone or at threshold). Contact the provider seeing the patient or DOD for further guidance.  If neither are available contact a member of the leadership team.

## 2018-08-30 ENCOUNTER — Other Ambulatory Visit: Payer: Self-pay

## 2018-08-30 ENCOUNTER — Ambulatory Visit (INDEPENDENT_AMBULATORY_CARE_PROVIDER_SITE_OTHER): Payer: Medicare Other | Admitting: Cardiology

## 2018-08-30 ENCOUNTER — Encounter: Payer: Self-pay | Admitting: Cardiology

## 2018-08-30 VITALS — BP 102/70 | HR 86 | Ht 60.0 in | Wt 119.0 lb

## 2018-08-30 DIAGNOSIS — I4821 Permanent atrial fibrillation: Secondary | ICD-10-CM | POA: Diagnosis not present

## 2018-08-30 NOTE — Progress Notes (Signed)
PCP:  [name]., MD Primary Cardiologist: No primary care provider on file. Electrophysiologist: Will Meredith Leeds, MD   Kathy Massey is a 85 y.o. female who presents today for routine electrophysiology follow-up for permanent atrial fibrillation.  Since last being seen in our clinic, the patient reports doing well overall. Her only complaint is dizziness. This is worse in the morning when she first gets up. She denies "room spinning" or near-syncope. She says it feels like it's mostly "in her head." She denies tachy-palpitations but occasionally feels her irregular heart beat. No falls. She denies symptoms of chest pain, shortness of breath, orthopnea, PND, lower extremity edema, claudication, dizziness, presyncope, syncope, bleeding, or neurologic sequela. The patient is tolerating medications without difficulties.    Past Medical History:  Diagnosis Date  . Arthritis    hands -oa  . Dysrhythmia    afib  . Heart murmur   . Hypertension   . 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 Myocardial infarction ([place])    06/15/15: stress induced cardiomyopathy   Past Surgical History:  Procedure Laterality Date  . CARDIAC CATHETERIZATION     06/17/15 [facility]): No CAD, LVEDP 9 mmHg, borderline LV contraction with apical akinesis, suggestive of stress-induced CM. EF > 55% by 06/16/15 echo.  . ELBOW BURSA SURGERY Right 2016  . EYE SURGERY     cataracts removed ,both eyes, /w IOL  . LYMPH NODE BIOPSY Left 01/14/2018   Procedure: EXCISION DEEP LEFT INGUINAL LYMPH NODE BIOPSY ERAS PATHWAY;  Surgeon: Fanny Skates, MD;  Location: [facility];  Service: General;  Laterality: Left;  . TONSILLECTOMY      Current Outpatient Medications  Medication Sig Dispense Refill  . Acalabrutinib 100 MG CAPS Take 100 mg by mouth at bedtime. 30 capsule 6  . apixaban (ELIQUIS) 2.5 MG TABS tablet Take 1 tablet (2.5 mg total) by mouth 2 (two) times daily. 60 tablet 6  .  diltiazem (CARDIZEM SR) 60 MG 12 hr capsule Take 1 tablet (60 mg total) once daily.  You may take a second tablet daily if needed for blood pressure and/or heart rate. 60 capsule 2   No current facility-administered medications for this visit.     Allergies  Allergen Reactions  . Shellfish Allergy Other (See Comments)    Shrimp Shrimp    Social History   Socioeconomic History  . Marital status: Widowed    Spouse name: Not on file  . Number of children: Not on file  . Years of education: Not on file  . Highest education level: Not on file  Occupational History  . Not on file  Social Needs  . Financial resource strain: Not on file  . Food insecurity    Worry: Not on file    Inability: Not on file  . Transportation needs    Medical: Not on file    Non-medical: Not on file  Tobacco Use  . Smoking status: Never Smoker  . Smokeless tobacco: Never Used  . Tobacco comment: never used tobacco  Substance and Sexual Activity  . Alcohol use: Yes    Alcohol/week: 1.0 standard drinks    Types: 1 Glasses of wine per week  . Drug use: No  . Sexual activity: Not on file  Lifestyle  . Physical activity    Days per week: Not on file    Minutes per session: Not on file  . Stress: Not on file  Relationships  . Social connections  Talks on phone: Not on file    Gets together: Not on file    Attends religious service: Not on file    Active member of club or organization: Not on file    Attends meetings of clubs or organizations: Not on file    Relationship status: Not on file  . Intimate partner violence    Fear of current or ex partner: Not on file    Emotionally abused: Not on file    Physically abused: Not on file    Forced sexual activity: Not on file  Other Topics Concern  . Not on file  Social History Narrative  . Not on file     Review of Systems: General: No chills, fever, night sweats or weight changes  Cardiovascular:  No chest pain, dyspnea on exertion, edema,  orthopnea, palpitations, paroxysmal nocturnal dyspnea Dermatological: No rash, lesions or masses Respiratory: No cough, dyspnea Urologic: No hematuria, dysuria Abdominal: No nausea, vomiting, diarrhea, bright red blood per rectum, melena, or hematemesis Neurologic: No visual changes, weakness, changes in mental status All other systems reviewed and are otherwise negative except as noted above.  Physical Exam: Vitals:   08/30/18 1154  BP: 102/70  Pulse: 86  Weight: 119 lb (54 kg)  Height: 5' (1.524 m)    GEN- The patient is well appearing, alert and oriented x 3 today.   HEENT: normocephalic, atraumatic; sclera clear, conjunctiva pink; hearing intact; oropharynx clear; neck supple, no JVP Lymph- no cervical lymphadenopathy Lungs- Clear to ausculation bilaterally, normal work of breathing.  No wheezes, rales, rhonchi Heart- Regular rate and rhythm, no murmurs, rubs or gallops, PMI not laterally displaced GI- soft, non-tender, non-distended, bowel sounds present, no hepatosplenomegaly Extremities- no clubbing, cyanosis, or edema; DP/PT/radial pulses 2+ bilaterally MS- no significant deformity or atrophy Skin- warm and dry, no rash or lesion Psych- euthymic mood, full affect Neuro- strength and sensation are intact  Assessment and Plan:  1.  Permanent atrial fibrillation: Continue eliquis. Adequate rate control on diltiazem 60 mg daily. She has had fatigue/dizziness and reported hypotension on higher doses.   This patients CHA2DS2-VASc Score is at least 3.  2. Stress induced myopathy: Most recent echo 06/2015 at [facility] showed EF normalization.   3. Dizziness - Suspect this is primarily balance/age related. She is not dizzy with standing from sitting. BP chronically low/normal.  Kathy Friar, PA-C  08/30/18 12:16 PM  I have seen and examined this patient with Kathy Massey.  Agree with above, note added to reflect my findings.  On exam, iRRR, no murmurs, lungs clear.    Patient with continued atrial fibrillation.  She does have dizziness, but at this point I do not feel like her dizziness is cardiovascular related.  We will continue with current dose of diltiazem.  Continue Eliquis.  Will M. Camnitz MD 08/30/2018 12:18 PM

## 2018-08-30 NOTE — Patient Instructions (Signed)
Medication Instructions:  Your physician recommends that you continue on your current medications as directed. Please refer to the Current Medication list given to you today.  If you need a refill on your cardiac medications before your next appointment, please call your pharmacy.   Labwork: None ordered  Testing/Procedures: None ordered  Follow-Up: Your physician wants you to follow-up in: 6 months with Dr. Camnitz.  You will receive a reminder letter in the mail two months in advance. If you don't receive a letter, please call our office to schedule the follow-up appointment.  Thank you for choosing [facility]!!   Jary Louvier, RN [phone]         

## 2018-09-16 ENCOUNTER — Other Ambulatory Visit: Payer: Self-pay | Admitting: Hematology & Oncology

## 2018-09-16 DIAGNOSIS — C8583 Other specified types of non-Hodgkin lymphoma, intra-abdominal lymph nodes: Secondary | ICD-10-CM

## 2018-09-17 MED FILL — CALQUENCE 100 MG CAPSULE: 100 | 30 days supply | Qty: 30 | Fill #0

## 2018-10-09 ENCOUNTER — Other Ambulatory Visit: Payer: Self-pay | Admitting: *Deleted

## 2018-10-09 DIAGNOSIS — C8590 Non-Hodgkin lymphoma, unspecified, unspecified site: Secondary | ICD-10-CM

## 2018-10-09 DIAGNOSIS — C8583 Other specified types of non-Hodgkin lymphoma, intra-abdominal lymph nodes: Secondary | ICD-10-CM

## 2018-10-09 MED ORDER — APIXABAN 2.5 MG PO TABS: 2.5000 mg | ORAL_TABLET | Freq: Two times a day (BID) | ORAL | 5 refills | Status: DC

## 2018-10-09 NOTE — Telephone Encounter (Signed)
Prescription refill request for Eliquis received.  Last office visit: Camnitz (08-30-2018) Scr: 1.29 (08-09-2018) Age: 85 y.o. Weight: 54 kg   Prescription refill sent.

## 2018-10-11 ENCOUNTER — Other Ambulatory Visit: Payer: Medicare Other

## 2018-10-11 ENCOUNTER — Ambulatory Visit: Payer: Medicare Other | Admitting: Hematology & Oncology

## 2018-10-16 MED FILL — CALQUENCE 100 MG CAPSULE: 100 | 30 days supply | Qty: 30 | Fill #1

## 2018-10-18 ENCOUNTER — Inpatient Hospital Stay: Payer: Medicare Other | Attending: Hematology & Oncology

## 2018-10-18 ENCOUNTER — Ambulatory Visit: Payer: Medicare Other | Admitting: Hematology & Oncology

## 2018-10-18 ENCOUNTER — Other Ambulatory Visit: Payer: Self-pay

## 2018-10-18 ENCOUNTER — Other Ambulatory Visit: Payer: Medicare Other

## 2018-10-18 ENCOUNTER — Inpatient Hospital Stay (HOSPITAL_BASED_OUTPATIENT_CLINIC_OR_DEPARTMENT_OTHER): Payer: Medicare Other | Admitting: Family

## 2018-10-18 DIAGNOSIS — Z79899 Other long term (current) drug therapy: Secondary | ICD-10-CM | POA: Insufficient documentation

## 2018-10-18 DIAGNOSIS — Z9221 Personal history of antineoplastic chemotherapy: Secondary | ICD-10-CM | POA: Diagnosis not present

## 2018-10-18 DIAGNOSIS — R42 Dizziness and giddiness: Secondary | ICD-10-CM | POA: Insufficient documentation

## 2018-10-18 DIAGNOSIS — C8583 Other specified types of non-Hodgkin lymphoma, intra-abdominal lymph nodes: Secondary | ICD-10-CM

## 2018-10-18 DIAGNOSIS — R5383 Other fatigue: Secondary | ICD-10-CM | POA: Insufficient documentation

## 2018-10-18 LAB — CMP (CANCER CENTER ONLY)
ALT: 7 U/L (ref 0–44)
AST: 22 U/L (ref 15–41)
Albumin: 4.1 g/dL (ref 3.5–5.0)
Alkaline Phosphatase: 85 U/L (ref 38–126)
Anion gap: 7 (ref 5–15)
BUN: 23 mg/dL (ref 8–23)
CO2: 27 mmol/L (ref 22–32)
Calcium: 9.3 mg/dL (ref 8.9–10.3)
Chloride: 106 mmol/L (ref 98–111)
Creatinine: 1.4 mg/dL — ABNORMAL HIGH (ref 0.44–1.00)
GFR, Est AFR Am: 39 mL/min — ABNORMAL LOW (ref >60)
GFR, Estimated: 33 mL/min — ABNORMAL LOW (ref >60)
Glucose, Bld: 112 mg/dL — ABNORMAL HIGH (ref 70–99)
Potassium: 4.4 mmol/L (ref 3.5–5.1)
Sodium: 140 mmol/L (ref 135–145)
Total Bilirubin: 0.8 mg/dL (ref 0.3–1.2)
Total Protein: 6.2 g/dL — ABNORMAL LOW (ref 6.5–8.1)

## 2018-10-18 LAB — CBC WITH DIFFERENTIAL (CANCER CENTER ONLY)
Abs Immature Granulocytes: 0.01 10*3/uL (ref 0.00–0.07)
Basophils Absolute: 0.1 10*3/uL (ref 0.0–0.1)
Basophils Relative: 1 %
Eosinophils Absolute: 0.2 10*3/uL (ref 0.0–0.5)
Eosinophils Relative: 4 %
HCT: 42.4 % (ref 36.0–46.0)
Hemoglobin: 13.4 g/dL (ref 12.0–15.0)
Immature Granulocytes: 0 %
Lymphocytes Relative: 17 %
Lymphs Abs: 0.7 10*3/uL (ref 0.7–4.0)
MCH: 30.9 pg (ref 26.0–34.0)
MCHC: 31.6 g/dL (ref 30.0–36.0)
MCV: 97.9 fL (ref 80.0–100.0)
Monocytes Absolute: 0.4 10*3/uL (ref 0.1–1.0)
Monocytes Relative: 9 %
Neutro Abs: 2.9 10*3/uL (ref 1.7–7.7)
Neutrophils Relative %: 69 %
Platelet Count: 195 10*3/uL (ref 150–400)
RBC: 4.33 MIL/uL (ref 3.87–5.11)
RDW: 15 % (ref 11.5–15.5)
WBC Count: 4.3 10*3/uL (ref 4.0–10.5)
nRBC: 0 % (ref 0.0–0.2)

## 2018-10-18 LAB — LACTATE DEHYDROGENASE: LDH: 207 U/L — ABNORMAL HIGH (ref 98–192)

## 2018-10-18 NOTE — Progress Notes (Signed)
Hematology and Oncology Follow Up Visit  ANAHLI ACCARDI [id number] [birth year] 85 y.o. 10/18/2018   Principle Diagnosis:  Recurrent small B-cell follicular NHL  Current Therapy:   Acalabrutinib 100 mg po q day - started on 02/25/2018   Interim History:  Ms. Helt is here today with her daughter for follow-up. She is doing fairly well.  She verbalized that she is taking her Acalabrutinib as prescribed.  Beta-2 microglobulin level in March was 3.0. Today's result is pending.   She is still not hydrating well at home and will at times have dizziness and fatigue.  She states that her appetite comes and goes. Her weight is stable at 121 lbs.  No fever, chills, n/v, cough, rash, dizziness, SOB, chest pain, palpitations, abdominal pain or changes in bowel or bladder habits.  No swelling, tenderness, numbness or tingling in her extremities.  No episodes of bleeding. No bruising or petechiae.   ECOG Performance Status: 1 - Symptomatic but completely ambulatory  Medications:  Allergies as of 10/18/2018      Reactions   Shellfish Allergy Other (See Comments)   Shrimp Shrimp      Medication List       Accurate as of October 18, 2018 11:47 AM. If you have any questions, ask your nurse or doctor.        apixaban 2.5 MG Tabs tablet Commonly known as: Eliquis Take 1 tablet (2.5 mg total) by mouth 2 (two) times daily.   Calquence 100 MG Caps Generic drug: Acalabrutinib TAKE 1 CAPSULE BY MOUTH AT BEDTIME.   diltiazem 60 MG 12 hr capsule Commonly known as: CARDIZEM SR Take 1 tablet (60 mg total) once daily.  You may take a second tablet daily if needed for blood pressure and/or heart rate.       Allergies:  Allergies  Allergen Reactions  . Shellfish Allergy Other (See Comments)    Shrimp Shrimp    Past Medical History, Surgical history, Social history, and Family History were reviewed and updated.  Review of Systems: All other 10 point review of systems is negative.    Physical Exam:  vitals were not taken for this visit.   Wt Readings from Last 3 Encounters:  08/30/18 119 lb (54 kg)  03/29/18 121 lb (54.9 kg)  03/05/18 123 lb (55.8 kg)    Ocular: Sclerae unicteric, pupils equal, round and reactive to light Ear-nose-throat: Oropharynx clear, dentition fair Lymphatic: No cervical or supraclavicular adenopathy Lungs no rales or rhonchi, good excursion bilaterally Heart regular rate and rhythm, no murmur appreciated Abd soft, nontender, positive bowel sounds, no liver or spleen tip palpated on exam, no fluid wave  MSK no focal spinal tenderness, no joint edema Neuro: non-focal, well-oriented, appropriate affect Breasts: Deferred   Lab Results  Component Value Date   WBC 4.3 10/18/2018   HGB 13.4 10/18/2018   HCT 42.4 10/18/2018   MCV 97.9 10/18/2018   PLT 195 10/18/2018   Lab Results  Component Value Date   FERRITIN 68 02/14/2018   IRON 170 (H) 02/14/2018   TIBC 308 02/14/2018   UIBC 138 02/14/2018   IRONPCTSAT 55 02/14/2018   Lab Results  Component Value Date   RBC 4.33 10/18/2018   No results found for: KPAFRELGTCHN, LAMBDASER, KAPLAMBRATIO Lab Results  Component Value Date   IGGSERUM 614 (L) 04/30/2018   IGA 65 04/30/2018   IGMSERUM 36 04/30/2018   No results found for: TOTALPROTELP, ALBUMINELP, A1GS, A2GS, BETS, BETA2SER, [place], [place], SPEI   Chemistry  Component Value Date/Time   NA 144 08/09/2018 1155   NA 144 10/17/2016 1000   K 4.7 08/09/2018 1155   K 4.5 10/17/2016 1000   CL 107 08/09/2018 1155   CL 106 10/17/2016 1000   CO2 29 08/09/2018 1155   CO2 30 10/17/2016 1000   BUN 22 08/09/2018 1155   BUN 18 10/17/2016 1000   CREATININE 1.29 (H) 08/09/2018 1155   CREATININE 1.6 (H) 10/17/2016 1000      Component Value Date/Time   CALCIUM 9.7 08/09/2018 1155   CALCIUM 9.4 10/17/2016 1000   ALKPHOS 102 08/09/2018 1155   ALKPHOS 101 (H) 10/17/2016 1000   AST 20 08/09/2018 1155   ALT 6 08/09/2018 1155   ALT  21 10/17/2016 1000   BILITOT 0.8 08/09/2018 1155       Impression and Plan: [name] 85 yo white female with recurrent small B-cell follicular NHL. She was originally treated with systemic chemotherapy with Rituxan/Treanda followed by maintenance Rituxan completed in February 2018. She now has recurrent small B-cell follicular NHL and is doing well on Acalabrutinib. She will continue her same regimen.  We will also get her set up for PET scan.  We will plan to see her back in another 3 months.  She will contact our office with any questions or concerns. We can certainly see her sooner if needed.   [name], NP 9/4/202011:47 AM

## 2018-10-19 LAB — BETA 2 MICROGLOBULIN, SERUM: Beta-2 Microglobulin: 2.9 mg/L — ABNORMAL HIGH (ref 0.6–2.4)

## 2018-11-12 MED FILL — CALQUENCE 100 MG CAPSULE: 100 | 30 days supply | Qty: 30 | Fill #2

## 2018-12-11 MED FILL — CALQUENCE 100 MG CAPSULE: 100 | 30 days supply | Qty: 30 | Fill #3

## 2019-01-06 MED FILL — CALQUENCE 100 MG CAPSULE: 100 | 30 days supply | Qty: 30 | Fill #4

## 2019-01-17 ENCOUNTER — Other Ambulatory Visit: Payer: Self-pay

## 2019-01-17 ENCOUNTER — Encounter: Payer: Self-pay | Admitting: Hematology & Oncology

## 2019-01-17 ENCOUNTER — Inpatient Hospital Stay: Payer: Medicare Other | Attending: Hematology & Oncology | Admitting: Hematology & Oncology

## 2019-01-17 ENCOUNTER — Inpatient Hospital Stay: Payer: Medicare Other

## 2019-01-17 VITALS — BP 112/88 | HR 92 | Temp 97.3°F | Resp 20 | Wt 121.1 lb

## 2019-01-17 DIAGNOSIS — I4891 Unspecified atrial fibrillation: Secondary | ICD-10-CM | POA: Insufficient documentation

## 2019-01-17 DIAGNOSIS — Z79899 Other long term (current) drug therapy: Secondary | ICD-10-CM | POA: Insufficient documentation

## 2019-01-17 DIAGNOSIS — R413 Other amnesia: Secondary | ICD-10-CM | POA: Insufficient documentation

## 2019-01-17 DIAGNOSIS — C8583 Other specified types of non-Hodgkin lymphoma, intra-abdominal lymph nodes: Secondary | ICD-10-CM

## 2019-01-17 DIAGNOSIS — C8280 Other types of follicular lymphoma, unspecified site: Secondary | ICD-10-CM | POA: Diagnosis not present

## 2019-01-17 LAB — CBC WITH DIFFERENTIAL (CANCER CENTER ONLY)
Abs Immature Granulocytes: 0.01 10*3/uL (ref 0.00–0.07)
Basophils Absolute: 0 10*3/uL (ref 0.0–0.1)
Basophils Relative: 1 %
Eosinophils Absolute: 0.1 10*3/uL (ref 0.0–0.5)
Eosinophils Relative: 3 %
HCT: 43.8 % (ref 36.0–46.0)
Hemoglobin: 14.1 g/dL (ref 12.0–15.0)
Immature Granulocytes: 0 %
Lymphocytes Relative: 19 %
Lymphs Abs: 0.9 10*3/uL (ref 0.7–4.0)
MCH: 30.9 pg (ref 26.0–34.0)
MCHC: 32.2 g/dL (ref 30.0–36.0)
MCV: 96.1 fL (ref 80.0–100.0)
Monocytes Absolute: 0.4 10*3/uL (ref 0.1–1.0)
Monocytes Relative: 8 %
Neutro Abs: 3.2 10*3/uL (ref 1.7–7.7)
Neutrophils Relative %: 69 %
Platelet Count: 208 10*3/uL (ref 150–400)
RBC: 4.56 MIL/uL (ref 3.87–5.11)
RDW: 12.7 % (ref 11.5–15.5)
WBC Count: 4.6 10*3/uL (ref 4.0–10.5)
nRBC: 0 % (ref 0.0–0.2)

## 2019-01-17 LAB — CMP (CANCER CENTER ONLY)
ALT: 6 U/L (ref 0–44)
AST: 21 U/L (ref 15–41)
Albumin: 4.6 g/dL (ref 3.5–5.0)
Alkaline Phosphatase: 107 U/L (ref 38–126)
Anion gap: 8 (ref 5–15)
BUN: 21 mg/dL (ref 8–23)
CO2: 28 mmol/L (ref 22–32)
Calcium: 9.4 mg/dL (ref 8.9–10.3)
Chloride: 107 mmol/L (ref 98–111)
Creatinine: 1.14 mg/dL — ABNORMAL HIGH (ref 0.44–1.00)
GFR, Est AFR Am: 49 mL/min — ABNORMAL LOW (ref >60)
GFR, Estimated: 43 mL/min — ABNORMAL LOW (ref >60)
Glucose, Bld: 93 mg/dL (ref 70–99)
Potassium: 4.1 mmol/L (ref 3.5–5.1)
Sodium: 143 mmol/L (ref 135–145)
Total Bilirubin: 0.7 mg/dL (ref 0.3–1.2)
Total Protein: 6.7 g/dL (ref 6.5–8.1)

## 2019-01-17 NOTE — Progress Notes (Signed)
Hematology and Oncology Follow Up Visit  SHARISA KRONENWETTER [id number] 1927/11/06 85 y.o. 01/17/2019   Principle Diagnosis:  Recurrent small B-cell follicular NHL  Current Therapy:   Acalabrutinib 100 mg po q day - started on 02/25/2018   Interim History:  Ms. Alliston is here today with her daughter for follow-up.  She looks pretty good.  According to her daughter, she is having some memory difficulties.  We looked up the side effects of acalabrutinib.  I cannot see anything in regards to memory loss.  We did repeat her PET scan.  Overall, the PET scan looked stable.  She still has some active lymph nodes.  Most were smaller in size.  Some were stable in size.  She is tolerating the acalabrutinib well.  She is only on the 100 mg daily dose.  I think this is adequate for her given her overall age and performance status.  She is still at the assisted living facility.  They really have been cautious with their residents.  As such, she is not really exposed to the coronavirus.  Her family is going to the beach starting next weekend.  I will see any problems with Ms. Rosado going.  Her family is very conscientious of her and her risk of getting the coronavirus.  Overall, her performance status is ECOG 2    Medications:  Allergies as of 01/17/2019      Reactions   Shellfish Allergy Other (See Comments)   Shrimp Shrimp      Medication List       Accurate as of January 17, 2019  1:54 PM. If you have any questions, ask your nurse or doctor.        apixaban 2.5 MG Tabs tablet Commonly known as: Eliquis Take 1 tablet (2.5 mg total) by mouth 2 (two) times daily.   Calquence 100 MG capsule Generic drug: acalabrutinib TAKE 1 CAPSULE BY MOUTH AT BEDTIME.   clobetasol ointment 0.05 % Commonly known as: TEMOVATE Apply topically.   diltiazem 60 MG 12 hr capsule Commonly known as: CARDIZEM SR Take 1 tablet (60 mg total) once daily.  You may take a second tablet daily if needed for blood  pressure and/or heart rate.       Allergies:  Allergies  Allergen Reactions  . Shellfish Allergy Other (See Comments)    Shrimp Shrimp    Past Medical History, Surgical history, Social history, and Family History were reviewed and updated.  Review of Systems: Review of Systems  Constitutional: Negative.   HENT: Negative.   Respiratory: Negative.   Cardiovascular: Positive for palpitations.  Gastrointestinal: Negative.   Genitourinary: Negative.   Musculoskeletal: Negative.   Skin: Negative.   Neurological: Negative.   Endo/Heme/Allergies: Negative.   Psychiatric/Behavioral: Negative.       Physical Exam:  weight is 121 lb 1.9 oz (54.9 kg). Her temporal temperature is 97.3 F (36.3 C) (abnormal). Her blood pressure is 112/88 and her pulse is 92. Her respiration is 20 and oxygen saturation is 100%.   Wt Readings from Last 3 Encounters:  01/17/19 121 lb 1.9 oz (54.9 kg)  08/30/18 119 lb (54 kg)  03/29/18 121 lb (54.9 kg)    Physical Exam Vitals signs reviewed.  HENT:     Head: Normocephalic and atraumatic.  Eyes:     Pupils: Pupils are equal, round, and reactive to light.  Neck:     Musculoskeletal: Normal range of motion.  Cardiovascular:     Rate and Rhythm: Normal  rate and regular rhythm.     Heart sounds: Normal heart sounds.     Comments: Cardiac exam shows an irregular rate and rhythm consistent with atrial fibrillation.  The rate is well controlled.  There are no murmurs, rubs or bruits. Pulmonary:     Effort: Pulmonary effort is normal.     Breath sounds: Normal breath sounds.  Abdominal:     General: Bowel sounds are normal.     Palpations: Abdomen is soft.     Comments: Soft abdomen with good bowel sounds.  There is no fluid wave.  There is no guarding or rebound tenderness.  She has no palpable inguinal adenopathy.  There is no liver or spleen tip that is palpable.  Musculoskeletal: Normal range of motion.        General: No tenderness or  deformity.  Lymphadenopathy:     Cervical: No cervical adenopathy.  Skin:    General: Skin is warm and dry.     Findings: No erythema or rash.  Neurological:     Mental Status: She is alert and oriented to person, place, and time.  Psychiatric:        Behavior: Behavior normal.        Thought Content: Thought content normal.        Judgment: Judgment normal.      Lab Results  Component Value Date   WBC 4.6 01/17/2019   HGB 14.1 01/17/2019   HCT 43.8 01/17/2019   MCV 96.1 01/17/2019   PLT 208 01/17/2019   Lab Results  Component Value Date   FERRITIN 68 02/14/2018   IRON 170 (H) 02/14/2018   TIBC 308 02/14/2018   UIBC 138 02/14/2018   IRONPCTSAT 55 02/14/2018   Lab Results  Component Value Date   RBC 4.56 01/17/2019   No results found for: KPAFRELGTCHN, LAMBDASER, KAPLAMBRATIO Lab Results  Component Value Date   IGGSERUM 614 (L) 04/30/2018   IGA 65 04/30/2018   IGMSERUM 36 04/30/2018   No results found for: Odetta Pink, SPEI   Chemistry      Component Value Date/Time   NA 140 10/18/2018 1130   NA 144 10/17/2016 1000   K 4.4 10/18/2018 1130   K 4.5 10/17/2016 1000   CL 106 10/18/2018 1130   CL 106 10/17/2016 1000   CO2 27 10/18/2018 1130   CO2 30 10/17/2016 1000   BUN 23 10/18/2018 1130   BUN 18 10/17/2016 1000   CREATININE 1.40 (H) 10/18/2018 1130   CREATININE 1.6 (H) 10/17/2016 1000      Component Value Date/Time   CALCIUM 9.3 10/18/2018 1130   CALCIUM 9.4 10/17/2016 1000   ALKPHOS 85 10/18/2018 1130   ALKPHOS 101 (H) 10/17/2016 1000   AST 22 10/18/2018 1130   ALT 7 10/18/2018 1130   ALT 21 10/17/2016 1000   BILITOT 0.8 10/18/2018 1130       Impression and Plan: Ms. Henn is 85 yo white female with recurrent small B-cell follicular NHL. She was originally treated with systemic chemotherapy with Rituxan/Treanda followed by maintenance Rituxan completed in February 2018.  She is now on  Acalabrutinib 100 mg PO QHS.   I do not think that we have really have to do any scans on her.  Again, she is not a candidate for anything really more aggressive than the acalabrutinib.  It seems to be doing quite well for her.  I cannot find anything on her exam that would suggest  that the lymphoma is recurring/progressing.  We will now try to get her back now in about 4 months.  We will get her through the holidays and through the wintertime.    At 85 years old, our goal clearly is her quality of life.  I have to believe that cardiac issues will get her longer for lymphoma will.  [name], MD 12/4/20201:54 PM

## 2019-01-18 LAB — BETA 2 MICROGLOBULIN, SERUM: Beta-2 Microglobulin: 2.7 mg/L — ABNORMAL HIGH (ref 0.6–2.4)

## 2019-01-20 LAB — LACTATE DEHYDROGENASE: LDH: 158 U/L (ref 98–192)

## 2019-01-31 MED FILL — CALQUENCE 100 MG CAPSULE: 100 | 30 days supply | Qty: 30 | Fill #5

## 2019-02-25 ENCOUNTER — Telehealth: Payer: Self-pay | Admitting: *Deleted

## 2019-02-25 NOTE — Telephone Encounter (Signed)
Message received from patient requesting a call back regarding Calquence.  Call placed back to patient and voicemail received.  Message left for pt to call office back with questions.

## 2019-02-26 NOTE — Telephone Encounter (Signed)
Opened in error

## 2019-03-02 NOTE — Progress Notes (Signed)
Electrophysiology Office Note   Date:  03/03/2019   ID:  Kathy Massey, DOB May 31, 1927, MRN [id number]  PCP:  [name]., MD  Cardiologist:   Primary Electrophysiologist:  Dr Curt Bears    CC: Follow up for atrial fibrillation   History of Present Illness: Kathy Massey is a 86 y.o. female who is being seen today for the evaluation of atrial fibrillation at the request of [name].,*. Presenting today for electrophysiology evaluation.  She has a history of stress-induced cardiomyopathy and atrial fibrillation.  She was initially persistent, but due to a lack of symptoms, she has opted for a rate control strategy.   Today, denies symptoms of palpitations, chest pain, shortness of breath, orthopnea, PND, lower extremity edema, claudication, dizziness, presyncope, syncope, bleeding, or neurologic sequela. The patient is tolerating medications without difficulties.  Overall she is doing well.  She has no chest pain or shortness of breath.  At times she does have some mild fatigue and dizziness, though her dizziness occurs when she moves her head and not when she stands up.  Her daughter is planning to take her to the dizziness clinic in [place].  Past Medical History:  Diagnosis Date  . Arthritis    hands -oa  . Dysrhythmia    afib  . Heart murmur   . Hypertension   . 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 Myocardial infarction ([place])    06/15/15: stress induced cardiomyopathy   Past Surgical History:  Procedure Laterality Date  . CARDIAC CATHETERIZATION     06/17/15 [facility]): No CAD, LVEDP 9 mmHg, borderline LV contraction with apical akinesis, suggestive of stress-induced CM. EF > 55% by 06/16/15 echo.  . ELBOW BURSA SURGERY Right 2016  . EYE SURGERY     cataracts removed ,both eyes, /w IOL  . LYMPH NODE BIOPSY Left 01/14/2018   Procedure: EXCISION DEEP LEFT INGUINAL LYMPH NODE BIOPSY ERAS PATHWAY;  Surgeon: Fanny Skates,  MD;  Location: [place];  Service: General;  Laterality: Left;  . TONSILLECTOMY       Current Outpatient Medications  Medication Sig Dispense Refill  . apixaban (ELIQUIS) 2.5 MG TABS tablet Take 1 tablet (2.5 mg total) by mouth 2 (two) times daily. 60 tablet 5  . CALQUENCE 100 MG CAPS TAKE 1 CAPSULE BY MOUTH AT BEDTIME. 30 capsule 6  . clobetasol ointment (TEMOVATE) 0.05 % Apply topically.    Marland Kitchen diltiazem (CARDIZEM SR) 60 MG 12 hr capsule Take 1 tablet (60 mg total) once daily.  You may take a second tablet daily if needed for blood pressure and/or heart rate. 60 capsule 2   No current facility-administered medications for this visit.    Allergies:   Shellfish allergy   Social History:  The patient  reports that she has never smoked. She has never used smokeless tobacco. She reports current alcohol use of about 1.0 standard drinks of alcohol per week. She reports that she does not use drugs.   Family History:  The patient's family history includes Alcohol abuse in her father and maternal grandfather; Breast cancer in her daughter and paternal grandmother; Depression in her father and mother; Heart attack in her father; Heart disease in her father and mother; Heart failure in her mother; Multiple myeloma in her maternal grandmother.   ROS:  Please see the history of present illness.   Otherwise, review of systems is positive for none.   All other systems are reviewed and negative.  PHYSICAL EXAM: VS:  BP 104/64   Pulse 92   Ht 5' (1.524 m)   Wt 124 lb 0.6 oz (56.3 kg)   SpO2 98%   BMI 24.22 kg/m  , BMI Body mass index is 24.22 kg/m. GEN: Well nourished, well developed, in no acute distress  HEENT: normal  Neck: no JVD, carotid bruits, or masses Cardiac: irregular; no murmurs, rubs, or gallops,no edema  Respiratory:  clear to auscultation bilaterally, normal work of breathing GI: soft, nontender, nondistended, + BS MS: no deformity or atrophy  Skin: warm and dry Neuro:  Strength  and sensation are intact Psych: euthymic mood, full affect  EKG:  EKG is ordered today. Personal review of the ekg ordered  shows atrial fibrillation, rate 92  Recent Labs: 01/17/2019: ALT 6; BUN 21; Creatinine 1.14; Hemoglobin 14.1; Platelet Count 208; Potassium 4.1; Sodium 143    Lipid Panel  No results found for: CHOL, TRIG, HDL, CHOLHDL, VLDL, LDLCALC, LDLDIRECT   Wt Readings from Last 3 Encounters:  03/03/19 124 lb 0.6 oz (56.3 kg)  01/17/19 121 lb 1.9 oz (54.9 kg)  08/30/18 119 lb (54 kg)      Other studies Reviewed: Additional studies/ records that were reviewed today include: TTE 06/16/15  Review of the above records today demonstrates:   Normal left ventricular systolic function, ejection fraction > 76%  Diastolic dysfunction - grade I (normal filling pressures)  Dilated left atrium - mild  Degenerative mitral valve disease  Tricuspid regurgitation - mild  Pulmonic regurgitation - mild  Normal right ventricular systolic function  Dilated right atrium - mild  Cardiac cath 06/15/15 1. No angiographically apparent coronary artery disease 2. Normal LV filling pressures  Holter 02/09/17 - personally reviewed Minimum HR: 48 BPM at 4:09:12 PM Maximum HR: 144 BPM at 9:53:16 PM(2) Average HR: 75 BPM <1% PVCs Predominant rhythm atrial fibrillation  ASSESSMENT AND PLAN:  1.  Permanent atrial fibrillation: Currently on Eliquis and diltiazem.  Have elected a rate control strategy.  CHA2DS2-VASc of 3.  Currently feeling well without complaint.  No changes.  2. Stress induced myopathy: Ejection fraction since normalized.  No changes.  Current medicines are reviewed at length with the patient today.   The patient does not have concerns regarding her medicines.  The following changes were made today: None  Labs/ tests ordered today include:  Orders Placed This Encounter  Procedures  . EKG 12-Lead     Disposition:   FU with Aundre Hietala 6 months  Signed, Jalyn Dutta  Meredith Leeds, MD  03/03/2019 2:05 PM     [address] [place]  [phone] (office) [phone] (fax)

## 2019-03-03 ENCOUNTER — Other Ambulatory Visit: Payer: Self-pay

## 2019-03-03 ENCOUNTER — Telehealth: Payer: Self-pay | Admitting: Pharmacy Technician

## 2019-03-03 ENCOUNTER — Encounter: Payer: Self-pay | Admitting: Cardiology

## 2019-03-03 ENCOUNTER — Ambulatory Visit (INDEPENDENT_AMBULATORY_CARE_PROVIDER_SITE_OTHER): Payer: Medicare PPO | Admitting: Cardiology

## 2019-03-03 VITALS — BP 104/64 | HR 92 | Ht 60.0 in | Wt 124.0 lb

## 2019-03-03 DIAGNOSIS — I4821 Permanent atrial fibrillation: Secondary | ICD-10-CM

## 2019-03-03 MED FILL — CALQUENCE 100 MG CAPSULE: 100 | 30 days supply | Qty: 30 | Fill #6

## 2019-03-03 NOTE — Patient Instructions (Signed)
Medication Instructions:  Your physician recommends that you continue on your current medications as directed. Please refer to the Current Medication list given to you today.  If you need a refill on your cardiac medications before your next appointment, please call your pharmacy.   Labwork: None ordered  Testing/Procedures: None ordered  Follow-Up: Your physician wants you to follow-up in: 6 months  with Dr. Camnitz.  You will receive a reminder letter in the mail two months in advance. If you don't receive a letter, please call our office to schedule the follow-up appointment.  Thank you for choosing [facility]!!   Sherri Price, RN [phone]  Any Other Special Instructions Will Be Listed Below (If Applicable).        

## 2019-03-03 NOTE — Telephone Encounter (Signed)
Oral Oncology Patient Advocate Encounter  Applied online for Lymphoma grant through [employer] to help reduce patients $100 copay to $0.  Due to the [facility] holiday, the foundation is not open to complete patient application for assistance.  I will continue to update this encounter until complete.  [place] Patient Kathy Massey Phone [phone] Fax [phone] 03/03/2019 1:38 PM

## 2019-03-04 NOTE — Telephone Encounter (Signed)
Oral Oncology Patient Advocate Encounter  Called [employer] this morning to see what information they needed for application to be processed.  Rep stated that they were having issues with applications not loading documents.  They are working on this issue and rep told me to keep checking the portal to see if the documents were visible.  If they are viewable then grant information should be available also.  Checked at 2:25 pm and the portal does not have any correspondence loaded.  [place] Patient [place] Phone [phone] Fax [phone] 03/04/2019 2:30 PM

## 2019-03-07 NOTE — Telephone Encounter (Signed)
Called to check the status of application.  Rep states that application is still being processed.

## 2019-03-11 NOTE — Telephone Encounter (Signed)
Called to check status of application.  Still pending.  Rep stated they are processed as they are received.  Will check back in a couple days.

## 2019-03-14 NOTE — Telephone Encounter (Signed)
Oral Oncology Patient Advocate Encounter  Was successful in securing patient a $ 5,000 grant from [employer] ([employer]) to provide copayment coverage for his Calquence.  This will keep the out of pocket expense at $0.    The billing information is as follows and has been shared with Loiza.   Member ID: [id number] Group ID: [id number] RxBin: [id number] Dates of Eligibility: 02/14/19 through 02/13/20  Fund:  [facility] [place] Phone [phone] Fax [phone] 03/14/2019 8:54 AM

## 2019-04-01 ENCOUNTER — Other Ambulatory Visit: Payer: Self-pay | Admitting: Hematology & Oncology

## 2019-04-01 DIAGNOSIS — C8583 Other specified types of non-Hodgkin lymphoma, intra-abdominal lymph nodes: Secondary | ICD-10-CM

## 2019-04-10 MED FILL — CALQUENCE 100 MG CAPSULE: 100 | 30 days supply | Qty: 30 | Fill #0

## 2019-04-11 ENCOUNTER — Other Ambulatory Visit: Payer: Self-pay | Admitting: Cardiology

## 2019-04-11 DIAGNOSIS — C8583 Other specified types of non-Hodgkin lymphoma, intra-abdominal lymph nodes: Secondary | ICD-10-CM

## 2019-04-11 DIAGNOSIS — C8590 Non-Hodgkin lymphoma, unspecified, unspecified site: Secondary | ICD-10-CM

## 2019-04-11 NOTE — Telephone Encounter (Signed)
Pt last saw Dr Curt Bears 03/03/19, last labs 01/17/19 Creat 1.14, age 86, weight 56.3kg, based on specified criteria pt is on appropriate dosage of Eliquis 2.5mg  BID.  Will refill rx.

## 2019-05-14 MED FILL — CALQUENCE 100 MG CAPSULE: 100 | 30 days supply | Qty: 30 | Fill #1

## 2019-05-19 ENCOUNTER — Inpatient Hospital Stay (HOSPITAL_BASED_OUTPATIENT_CLINIC_OR_DEPARTMENT_OTHER): Payer: Medicare PPO | Admitting: Hematology & Oncology

## 2019-05-19 ENCOUNTER — Inpatient Hospital Stay: Payer: Medicare PPO | Attending: Hematology & Oncology

## 2019-05-19 ENCOUNTER — Other Ambulatory Visit: Payer: Self-pay

## 2019-05-19 VITALS — BP 113/81 | HR 100 | Temp 98.0°F | Resp 18 | Wt 119.8 lb

## 2019-05-19 DIAGNOSIS — M25552 Pain in left hip: Secondary | ICD-10-CM | POA: Diagnosis not present

## 2019-05-19 DIAGNOSIS — C8280 Other types of follicular lymphoma, unspecified site: Secondary | ICD-10-CM | POA: Insufficient documentation

## 2019-05-19 DIAGNOSIS — C8583 Other specified types of non-Hodgkin lymphoma, intra-abdominal lymph nodes: Secondary | ICD-10-CM

## 2019-05-19 DIAGNOSIS — E860 Dehydration: Secondary | ICD-10-CM | POA: Diagnosis not present

## 2019-05-19 LAB — CBC WITH DIFFERENTIAL (CANCER CENTER ONLY)
Abs Immature Granulocytes: 0.02 10*3/uL (ref 0.00–0.07)
Basophils Absolute: 0 10*3/uL (ref 0.0–0.1)
Basophils Relative: 1 %
Eosinophils Absolute: 0.1 10*3/uL (ref 0.0–0.5)
Eosinophils Relative: 3 %
HCT: 43.4 % (ref 36.0–46.0)
Hemoglobin: 13.8 g/dL (ref 12.0–15.0)
Immature Granulocytes: 0 %
Lymphocytes Relative: 17 %
Lymphs Abs: 0.9 10*3/uL (ref 0.7–4.0)
MCH: 30.4 pg (ref 26.0–34.0)
MCHC: 31.8 g/dL (ref 30.0–36.0)
MCV: 95.6 fL (ref 80.0–100.0)
Monocytes Absolute: 0.3 10*3/uL (ref 0.1–1.0)
Monocytes Relative: 6 %
Neutro Abs: 3.9 10*3/uL (ref 1.7–7.7)
Neutrophils Relative %: 73 %
Platelet Count: 208 10*3/uL (ref 150–400)
RBC: 4.54 MIL/uL (ref 3.87–5.11)
RDW: 14 % (ref 11.5–15.5)
WBC Count: 5.3 10*3/uL (ref 4.0–10.5)
nRBC: 0 % (ref 0.0–0.2)

## 2019-05-19 LAB — CMP (CANCER CENTER ONLY)
ALT: 7 U/L (ref 0–44)
AST: 21 U/L (ref 15–41)
Albumin: 4.2 g/dL (ref 3.5–5.0)
Alkaline Phosphatase: 106 U/L (ref 38–126)
Anion gap: 7 (ref 5–15)
BUN: 22 mg/dL (ref 8–23)
CO2: 29 mmol/L (ref 22–32)
Calcium: 9.6 mg/dL (ref 8.9–10.3)
Chloride: 107 mmol/L (ref 98–111)
Creatinine: 1.31 mg/dL — ABNORMAL HIGH (ref 0.44–1.00)
GFR, Est AFR Am: 42 mL/min — ABNORMAL LOW (ref >60)
GFR, Estimated: 36 mL/min — ABNORMAL LOW (ref >60)
Glucose, Bld: 93 mg/dL (ref 70–99)
Potassium: 4.6 mmol/L (ref 3.5–5.1)
Sodium: 143 mmol/L (ref 135–145)
Total Bilirubin: 0.7 mg/dL (ref 0.3–1.2)
Total Protein: 6.2 g/dL — ABNORMAL LOW (ref 6.5–8.1)

## 2019-05-19 NOTE — Progress Notes (Signed)
Hematology and Oncology Follow Up Visit  LEVORA Massey [id number] Dec 25, 1927 86 y.o. 05/19/2019   Principle Diagnosis:  Recurrent small B-cell follicular NHL  Current Therapy:   Acalabrutinib 100 mg po q day - started on 02/25/2018   Interim History:  Kathy Massey is here today with her daughter for follow-up.  Thankfully, she is managing as well as be expected for being 86 years old.  She still is at the assisted living facility.  She made it through all the holidays and winter.  She is having some pain in the left hip area.  I suspect this probably is arthritic.  Of course, her daughter wants this to be evaluated.  I told her that orthopedic will evaluate this.  We will have to make a referral for her.  She is on acalabrutinib.  I think she is done well with the acalabrutinib.  There is no evidence I can tell of her lymphoma being active.  She is eating okay.  She is little dehydrated.  She just forgets that she needs to drink.  She has had no obvious change in bowel or bladder habits.  Again, our primary goal is quality of life.  I really do not think we have to get all that aggressive with respect to doing radiographic studies on her with a respect to the lymphoma.  I think that everything is holding steady with the lymphoma.  Overall, I would say her performance status is ECOG 2-3.    Medications:  Allergies as of 05/19/2019      Reactions   Shellfish Allergy Other (See Comments)   Shrimp Shrimp      Medication List       Accurate as of May 19, 2019  3:17 PM. If you have any questions, ask your nurse or doctor.        Calquence 100 MG capsule Generic drug: acalabrutinib TAKE 1 CAPSULE BY MOUTH AT BEDTIME.   clobetasol ointment 0.05 % Commonly known as: TEMOVATE Apply topically.   diltiazem 60 MG 12 hr capsule Commonly known as: CARDIZEM SR Take 1 tablet (60 mg total) once daily.  You may take a second tablet daily if needed for blood pressure and/or heart rate.    Eliquis 2.5 MG Tabs tablet Generic drug: apixaban TAKE 1 TABLET BY MOUTH TWICE A DAY       Allergies:  Allergies  Allergen Reactions  . Shellfish Allergy Other (See Comments)    Shrimp Shrimp    Past Medical History, Surgical history, Social history, and Family History were reviewed and updated.  Review of Systems: Review of Systems  Constitutional: Negative.   HENT: Negative.   Respiratory: Negative.   Cardiovascular: Positive for palpitations.  Gastrointestinal: Negative.   Genitourinary: Negative.   Musculoskeletal: Negative.   Skin: Negative.   Neurological: Negative.   Endo/Heme/Allergies: Negative.   Psychiatric/Behavioral: Negative.       Physical Exam:  weight is 119 lb 12 oz (54.3 kg). Her temporal temperature is 98 F (36.7 C). Her blood pressure is 113/81 and her pulse is 100. Her respiration is 18 and oxygen saturation is 100%.   Wt Readings from Last 3 Encounters:  05/19/19 119 lb 12 oz (54.3 kg)  03/03/19 124 lb 0.6 oz (56.3 kg)  01/17/19 121 lb 1.9 oz (54.9 kg)    Physical Exam Vitals reviewed.  HENT:     Head: Normocephalic and atraumatic.  Eyes:     Pupils: Pupils are equal, round, and reactive to light.  Cardiovascular:     Rate and Rhythm: Normal rate and regular rhythm.     Heart sounds: Normal heart sounds.     Comments: Cardiac exam shows an irregular rate and rhythm consistent with atrial fibrillation.  The rate is well controlled.  There are no murmurs, rubs or bruits. Pulmonary:     Effort: Pulmonary effort is normal.     Breath sounds: Normal breath sounds.  Abdominal:     General: Bowel sounds are normal.     Palpations: Abdomen is soft.     Comments: Soft abdomen with good bowel sounds.  There is no fluid wave.  There is no guarding or rebound tenderness.  She has no palpable inguinal adenopathy.  There is no liver or spleen tip that is palpable.  Musculoskeletal:        General: No tenderness or deformity. Normal range of  motion.     Cervical back: Normal range of motion.  Lymphadenopathy:     Cervical: No cervical adenopathy.  Skin:    General: Skin is warm and dry.     Findings: No erythema or rash.  Neurological:     Mental Status: She is alert and oriented to person, place, and time.  Psychiatric:        Behavior: Behavior normal.        Thought Content: Thought content normal.        Judgment: Judgment normal.      Lab Results  Component Value Date   WBC 5.3 05/19/2019   HGB 13.8 05/19/2019   HCT 43.4 05/19/2019   MCV 95.6 05/19/2019   PLT 208 05/19/2019   Lab Results  Component Value Date   FERRITIN 68 02/14/2018   IRON 170 (H) 02/14/2018   TIBC 308 02/14/2018   UIBC 138 02/14/2018   IRONPCTSAT 55 02/14/2018   Lab Results  Component Value Date   RBC 4.54 05/19/2019   No results found for: KPAFRELGTCHN, LAMBDASER, [facility] Lab Results  Component Value Date   IGGSERUM 614 (L) 04/30/2018   IGA 65 04/30/2018   IGMSERUM 36 04/30/2018   No results found for: Odetta Pink, SPEI   Chemistry      Component Value Date/Time   NA 143 01/17/2019 1326   NA 144 10/17/2016 1000   K 4.1 01/17/2019 1326   K 4.5 10/17/2016 1000   CL 107 01/17/2019 1326   CL 106 10/17/2016 1000   CO2 28 01/17/2019 1326   CO2 30 10/17/2016 1000   BUN 21 01/17/2019 1326   BUN 18 10/17/2016 1000   CREATININE 1.14 (H) 01/17/2019 1326   CREATININE 1.6 (H) 10/17/2016 1000      Component Value Date/Time   CALCIUM 9.4 01/17/2019 1326   CALCIUM 9.4 10/17/2016 1000   ALKPHOS 107 01/17/2019 1326   ALKPHOS 101 (H) 10/17/2016 1000   AST 21 01/17/2019 1326   ALT 6 01/17/2019 1326   ALT 21 10/17/2016 1000   BILITOT 0.7 01/17/2019 1326       Impression and Plan: [name] is 86 yo white female with recurrent small B-cell follicular NHL. She was originally treated with systemic chemotherapy with Rituxan/Treanda followed by maintenance Rituxan  completed in February 2018.  She is now on Acalabrutinib 100 mg PO QHS.   I do not think that we have really have to do any scans on her.  Again, she is not a candidate for anything really more aggressive than the acalabrutinib.  It  seems to be doing quite well for her.  I cannot find anything on her exam that would suggest that the lymphoma is recurring/progressing.  Hopefully, orthopedic surgery-Dr. Herbert Moors will be able to help and just give her a little bit of relief.  I told her daughter that she could certainly take Tylenol to try to help with any of the discomfort.  We will try to get her through the spring and summer now.  We will plan to get her back in September.  [name], MD 4/5/20213:17 PM

## 2019-05-20 LAB — KAPPA/LAMBDA LIGHT CHAINS
Kappa free light chain: 11.9 mg/L (ref 3.3–19.4)
Kappa, lambda light chain ratio: 1.31 (ref 0.26–1.65)
Lambda free light chains: 9.1 mg/L (ref 5.7–26.3)

## 2019-05-20 LAB — LACTATE DEHYDROGENASE: LDH: 190 U/L (ref 98–192)

## 2019-05-20 LAB — IGG, IGA, IGM
IgA: 66 mg/dL (ref 64–422)
IgG (Immunoglobin G), Serum: 573 mg/dL — ABNORMAL LOW (ref 586–1602)
IgM (Immunoglobulin M), Srm: 37 mg/dL (ref 26–217)

## 2019-06-03 ENCOUNTER — Telehealth: Payer: Self-pay | Admitting: *Deleted

## 2019-06-03 DIAGNOSIS — C8583 Other specified types of non-Hodgkin lymphoma, intra-abdominal lymph nodes: Secondary | ICD-10-CM

## 2019-06-03 MED ORDER — CLINDAMYCIN PHOSPHATE 1 % EX LOTN: TOPICAL_LOTION | Freq: Two times a day (BID) | CUTANEOUS | 3 refills | Status: DC

## 2019-06-03 NOTE — Telephone Encounter (Signed)
Call received from patient's daughter [name] to inform Dr. Marin Olp that pt has a red rash on her face which resolved with Clindamycin lotion in the past and would like a refill.  Dr. Marin Olp notified and refill sent per order of Dr. Marin Olp.  Pt.'s daughter notified and has no other questions or concerns at this time.

## 2019-06-09 MED FILL — CALQUENCE 100 MG CAPSULE: 100 | 30 days supply | Qty: 30 | Fill #2

## 2019-07-15 MED FILL — CALQUENCE 100 MG CAPSULE: 100 | 30 days supply | Qty: 30 | Fill #3

## 2019-08-07 MED FILL — CALQUENCE 100 MG CAPSULE: 100 | 30 days supply | Qty: 30 | Fill #4

## 2019-08-27 ENCOUNTER — Other Ambulatory Visit: Payer: Self-pay

## 2019-08-27 ENCOUNTER — Other Ambulatory Visit: Payer: Self-pay | Admitting: Podiatry

## 2019-08-27 ENCOUNTER — Encounter: Payer: Self-pay | Admitting: Podiatry

## 2019-08-27 ENCOUNTER — Ambulatory Visit: Payer: Medicare PPO | Admitting: Podiatry

## 2019-08-27 ENCOUNTER — Ambulatory Visit (INDEPENDENT_AMBULATORY_CARE_PROVIDER_SITE_OTHER): Payer: Medicare PPO

## 2019-08-27 DIAGNOSIS — M79674 Pain in right toe(s): Secondary | ICD-10-CM

## 2019-08-27 DIAGNOSIS — B351 Tinea unguium: Secondary | ICD-10-CM

## 2019-08-27 DIAGNOSIS — R52 Pain, unspecified: Secondary | ICD-10-CM

## 2019-08-27 DIAGNOSIS — M2042 Other hammer toe(s) (acquired), left foot: Secondary | ICD-10-CM | POA: Diagnosis not present

## 2019-08-27 DIAGNOSIS — M21619 Bunion of unspecified foot: Secondary | ICD-10-CM

## 2019-08-27 DIAGNOSIS — M21611 Bunion of right foot: Secondary | ICD-10-CM | POA: Diagnosis not present

## 2019-08-27 DIAGNOSIS — M79675 Pain in left toe(s): Secondary | ICD-10-CM | POA: Diagnosis not present

## 2019-08-27 NOTE — Patient Instructions (Signed)
Hammer Toe  Hammer toe is a change in the shape (a deformity) of your toe. The deformity causes the middle joint of your toe to stay bent. This causes pain, especially when you are wearing shoes. Hammer toe starts gradually. At first, the toe can be straightened. Gradually over time, the deformity becomes stiff and permanent. Early treatments to keep the toe straight may relieve pain. As the deformity becomes stiff and permanent, surgery may be needed to straighten the toe. What are the causes? Hammer toe is caused by abnormal bending of the toe joint that is closest to your foot. It happens gradually over time. This pulls on the muscles and connections (tendons) of the toe joint, making them weak and stiff. It is often related to wearing shoes that are too short or narrow and do not let your toes straighten. What increases the risk? You may be at greater risk for hammer toe if you:  Are female.  Are older.  Wear shoes that are too small.  Wear high-heeled shoes that pinch your toes.  Are a ballet dancer.  Have a second toe that is longer than your big toe (first toe).  Injure your foot or toe.  Have arthritis.  Have a family history of hammer toe.  Have a nerve or muscle disorder. What are the signs or symptoms? The main symptoms of this condition are pain and deformity of the toe. The pain is worse when wearing shoes, walking, or running. Other symptoms may include:  Corns or calluses over the bent part of the toe or between the toes.  Redness and a burning feeling on the toe.  An open sore that forms on the top of the toe.  Not being able to straighten the toe. How is this diagnosed? This condition is diagnosed based on your symptoms and a physical exam. During the exam, your health care provider will try to straighten your toe to see how stiff the deformity is. You may also have tests, such as:  A blood test to check for rheumatoid arthritis.  An X-ray to show how  severe the deformity is. How is this treated? Treatment for this condition will depend on how stiff the deformity is. Surgery is often needed. However, sometimes a hammer toe can be straightened without surgery. Treatments that do not involve surgery include:  Taping the toe into a straightened position.  Using pads and cushions to protect the toe (orthotics).  Wearing shoes that provide enough room for the toes.  Doing toe-stretching exercises at home.  Taking an NSAID to reduce pain and swelling. If these treatments do not help or the toe cannot be straightened, surgery is the next option. The most common surgeries used to straighten a hammer toe include:  Arthroplasty. In this procedure, part of the joint is removed, and that allows the toe to straighten.  Fusion. In this procedure, cartilage between the two bones of the joint is taken out and the bones are fused together into one longer bone.  Implantation. In this procedure, part of the bone is removed and replaced with an implant to let the toe move again.  Flexor tendon transfer. In this procedure, the tendons that curl the toes down (flexor tendons) are repositioned. Follow these instructions at home:  Take over-the-counter and prescription medicines only as told by your health care provider.  Do toe straightening and stretching exercises as told by your health care provider.  Keep all follow-up visits as told by your health care   provider. This is important. How is this prevented?  Wear shoes that give your toes enough room and do not cause pain.  Do not wear high-heeled shoes. Contact a health care provider if:  Your pain gets worse.  Your toe becomes red or swollen.  You develop an open sore on your toe. This information is not intended to replace advice given to you by your health care provider. Make sure you discuss any questions you have with your health care provider. Document Revised: 01/12/2017 Document  Reviewed: 05/26/2015 Elsevier Patient Education  2020 Elsevier Inc.  

## 2019-08-27 NOTE — Progress Notes (Signed)
Subjective:   Patient ID: Kathy Massey, female   DOB: 86 y.o.   MRN: [id number]   HPI Patient presents with caregiver with a digital deformity second left that becomes painful in shoe gear and nails that they cannot cut that are thick cannot be reached and are painful when she wear shoe gear.  Patient does not smoke and likes to be active   Review of Systems  All other systems reviewed and are negative.       Objective:  Physical Exam Vitals and nursing note reviewed.  Constitutional:      Appearance: She is well-developed.  Pulmonary:     Effort: Pulmonary effort is normal.  Musculoskeletal:        General: Normal range of motion.  Skin:    General: Skin is warm.  Neurological:     Mental Status: She is alert.     Neurovascular status was found to be intact muscle strength was found to be adequate.  Range of motion moderately reduced subtalar midtarsal joint with significant digital deformity second left with elevation of the digit and redness on top of the toes with rigid contracture noted.  Patient has nail disease 1-5 both feet that are thick yellow brittle and painful when pressed and has moderate structural deformity of the first metatarsal left.  Patient was noted to have good digital perfusion well oriented x3     Assessment:  Hammertoe deformity second left with elevation of the toe and irritation of tissue with bunion deformity and mycotic nail infection with pain 1-5 both feet     Plan:  H&P and discussed all conditions with patient.  I reviewed the consideration for tenotomy of the second digit left but I would prefer not to do this if possible we will try cushioning first with cushion dispensed.  I debrided nailbeds 1-5 both feet discussed routine care and I also reviewed the bunion deformity on x-ray and do not recommend treatment  X-rays indicate significant structural bunion deformity left over right with rigid contracture of the second digit left foot

## 2019-09-08 ENCOUNTER — Ambulatory Visit: Payer: Medicare PPO | Admitting: Cardiology

## 2019-09-08 ENCOUNTER — Encounter: Payer: Self-pay | Admitting: Cardiology

## 2019-09-08 ENCOUNTER — Other Ambulatory Visit: Payer: Self-pay

## 2019-09-08 ENCOUNTER — Encounter: Payer: Self-pay | Admitting: *Deleted

## 2019-09-08 VITALS — BP 90/64 | HR 96 | Ht 61.0 in | Wt 117.0 lb

## 2019-09-08 DIAGNOSIS — I4819 Other persistent atrial fibrillation: Secondary | ICD-10-CM | POA: Diagnosis not present

## 2019-09-08 DIAGNOSIS — R55 Syncope and collapse: Secondary | ICD-10-CM

## 2019-09-08 NOTE — Progress Notes (Signed)
Electrophysiology Office Note   Date:  09/08/2019   ID:  [name], DOB 01-20-1928, MRN [id number]  PCP:  [name]., MD  Cardiologist:   Primary Electrophysiologist:  Dr Curt Bears    CC: Follow up for atrial fibrillation   History of Present Illness: Kathy Massey is a 86 y.o. female who is being seen today for the evaluation of atrial fibrillation at the request of [name].,*. Presenting today for electrophysiology evaluation.  She has a history of stress-induced cardiomyopathy and atrial fibrillation.  She was initially persistent, but due to a lack of symptoms, she has opted for a rate control strategy.  Today, denies symptoms of palpitations, chest pain, shortness of breath, orthopnea, PND, lower extremity edema, claudication, dizziness, presyncope, syncope, bleeding, or neurologic sequela. The patient is tolerating medications without difficulties.  The patient states that she feels well.  She has no chest pain or shortness of breath.  She said that she is not dizzy.  Patient's daughter has a different story.  She states that the patient complains of dizziness most mornings.  She has been canceling events due to her feeling poorly.  She also plays cards with some of her friends, and at times, is minimally responsive during cart playing.  The patient does not remember any of these episodes.  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 EF > 55% by 06/16/15 echo.   ELBOW BURSA SURGERY Right 2016   EYE SURGERY     cataracts  removed ,both eyes, /w IOL   LYMPH NODE BIOPSY Left 01/14/2018   Procedure: EXCISION DEEP LEFT INGUINAL LYMPH NODE BIOPSY ERAS PATHWAY;  Surgeon: Fanny Skates, MD;  Location: [facility];  Service: General;  Laterality: Left;   TONSILLECTOMY       Current Outpatient Medications  Medication Sig Dispense Refill   CALQUENCE 100 MG capsule TAKE 1 CAPSULE BY MOUTH AT BEDTIME. 30 capsule 6   clindamycin (CLEOCIN-T) 1 % lotion Apply topically 2 (two) times daily. 60 mL 3   clobetasol ointment (TEMOVATE) 0.05 % Apply topically.     diltiazem (CARDIZEM SR) 60 MG 12 hr capsule Take 1 tablet (60 mg total) once daily.  You may take a second tablet daily if needed for blood pressure and/or heart rate. 60 capsule 2   ELIQUIS 2.5 MG TABS tablet TAKE 1 TABLET BY MOUTH TWICE A DAY 60 tablet 5   No current facility-administered medications for this visit.    Allergies:   Shellfish allergy   Social History:  The patient  reports that she has never smoked. She has never used smokeless tobacco. She reports current alcohol use of about 1.0 standard drink of alcohol per week. She reports that she does not use drugs.   Family History:  The patient's family history includes Alcohol abuse in her father and maternal grandfather; Breast cancer in her daughter and paternal grandmother; Depression in her father and mother; Heart attack in her father; Heart disease in her father and mother; Heart failure in her  mother; Multiple myeloma in her maternal grandmother.   ROS:  Please see the history of present illness.   Otherwise, review of systems is positive for none.   All other systems are reviewed and negative.   PHYSICAL EXAM: VS:  BP (!) 90/64    Pulse 96    Ht '5\' 1"'  (1.549 m)    Wt 117 lb (53.1 kg)    SpO2 96%    BMI 22.11 kg/m  , BMI Body mass index is 22.11 kg/m. GEN: Well nourished, well developed, in no acute distress  HEENT: normal  Neck: no JVD, carotid bruits, or masses Cardiac: RRR; no murmurs,  rubs, or gallops,no edema  Respiratory:  clear to auscultation bilaterally, normal work of breathing GI: soft, nontender, nondistended, + BS MS: no deformity or atrophy  Skin: warm and dry Neuro:  Strength and sensation are intact Psych: euthymic mood, full affect  EKG:  EKG is ordered today. Personal review of the ekg ordered shows atrial fibrillation, rate 94  Recent Labs: 05/19/2019: ALT 7; BUN 22; Creatinine 1.31; Hemoglobin 13.8; Platelet Count 208; Potassium 4.6; Sodium 143    Lipid Panel  No results found for: CHOL, TRIG, HDL, CHOLHDL, VLDL, LDLCALC, LDLDIRECT   Wt Readings from Last 3 Encounters:  09/08/19 117 lb (53.1 kg)  05/19/19 119 lb 12 oz (54.3 kg)  03/03/19 124 lb 0.6 oz (56.3 kg)      Other studies Reviewed: Additional studies/ records that were reviewed today include: TTE 06/16/15  Review of the above records today demonstrates:   Normal left ventricular systolic function, ejection fraction > 24%  Diastolic dysfunction - grade I (normal filling pressures)  Dilated left atrium - mild  Degenerative mitral valve disease  Tricuspid regurgitation - mild  Pulmonic regurgitation - mild  Normal right ventricular systolic function  Dilated right atrium - mild  Cardiac cath 06/15/15 1. No angiographically apparent coronary artery disease 2. Normal LV filling pressures  Holter 02/09/17 - personally reviewed Minimum HR: 48 BPM at 4:09:12 PM Maximum HR: 144 BPM at 9:53:16 PM(2) Average HR: 75 BPM <1% PVCs Predominant rhythm atrial fibrillation  ASSESSMENT AND PLAN:  1.  Permanent atrial fibrillation: Currently on Eliquis and diltiazem.  CHA2DS2-VASc 3.  Feeling well without complaint.  The patient does have some symptoms of dizziness and possible blackouts.  She does have an appointment coming up with a geriatric group in [place].  Prior to that, we Shabree Tebbetts fit her with a 2-week monitor to further evaluate her atrial fibrillation rates.  2. Stress  induced myopathy: Ejection fraction has since normalized.  No changes.  Current medicines are reviewed at length with the patient today.   The patient does not have concerns regarding her medicines.  The following changes were made today: None  Labs/ tests ordered today include:  Orders Placed This Encounter  Procedures   EKG 12-Lead     Disposition:   FU with Airica Schwartzkopf 6 hours months  Signed, Renelle Stegenga Meredith Leeds, MD  09/08/2019 3:13 PM     [facility] [address] [place] [address] [phone] (office) [phone] (fax)

## 2019-09-08 NOTE — Progress Notes (Signed)
Patient ID: Kathy Massey, female   DOB: [birth year], 86 y.o.   MRN: [id number] Patient enrolled for [employer] to ship a 14 day ZIO XT long term holter monitor to her home.

## 2019-09-08 NOTE — Patient Instructions (Signed)
Medication Instructions:  Your physician recommends that you continue on your current medications as directed. Please refer to the Current Medication list given to you today.  *If you need a refill on your cardiac medications before your next appointment, please call your pharmacy*   Lab Work: None ordered.  If you have labs (blood work) drawn today and your tests are completely normal, you will receive your results only by: Marland Kitchen MyChart Message (if you have MyChart) OR . A paper copy in the mail If you have any lab test that is abnormal or we need to change your treatment, we will call you to review the results.   Testing/Procedures: [facility]- Long Term Monitor Instructions   Your physician has requested you wear your ZIO patch monitor_14______days.   This is a single patch monitor.  [employer] supplies one patch monitor per enrollment.  Additional stickers are not available.   Please do not apply patch if you will be having a Nuclear Stress Test, Echocardiogram, Cardiac CT, MRI, or Chest Xray during the time frame you would be wearing the monitor. The patch cannot be worn during these tests.  You cannot remove and re-apply the ZIO XT patch monitor.   Your ZIO patch monitor will be sent USPS Priority mail from [employer] directly to your home address. The monitor may also be mailed to a PO BOX if home delivery is not available.   It may take 3-5 days to receive your monitor after you have been enrolled.   Once you have received you monitor, please review enclosed instructions.  Your monitor has already been registered assigning a specific monitor serial # to you.   Applying the monitor   Shave hair from upper left chest.   Hold abrader disc by orange tab.  Rub abrader in 40 strokes over left upper chest as indicated in your monitor instructions.   Clean area with 4 enclosed alcohol pads .  Use all pads to assure are is cleaned thoroughly.  Let dry.   Apply patch as indicated in  monitor instructions.  Patch will be place under collarbone on left side of chest with arrow pointing upward.   Rub patch adhesive wings for 2 minutes.Remove white label marked "1".  Remove white label marked "2".  Rub patch adhesive wings for 2 additional minutes.   While looking in a mirror, press and release button in center of patch.  A small green light will flash 3-4 times .  This will be your only indicator the monitor has been turned on.     Do not shower for the first 24 hours.  You may shower after the first 24 hours.   Press button if you feel a symptom. You will hear a small click.  Record Date, Time and Symptom in the Patient Log Book.   When you are ready to remove patch, follow instructions on last 2 pages of Patient Log Book.  Stick patch monitor onto last page of Patient Log Book.   Place Patient Log Book in [facility] box.  Use locking tab on box and tape box closed securely.  The [employer] and [employer] has [employer] on it.  Please place in mailbox as soon as possible.  Your physician should have your test results approximately 7 days after the monitor has been mailed back to [facility].   Call [facility] at [phone] if you have questions regarding your ZIO XT patch monitor.  Call them immediately if you see  an orange light blinking on your monitor.   If your monitor falls off in less than 4 days contact our Monitor department at [phone].  If your monitor becomes loose or falls off after 4 days call [employer] at [phone] for suggestions on securing your monitor.     Follow-Up: At [facility], you and your health needs are our priority.  As part of our continuing mission to provide you with exceptional heart care, we have created designated Provider Care Teams.  These Care Teams include your primary Cardiologist (physician) and Advanced Practice Providers (APPs -  Physician Assistants and Nurse Practitioners) who all work together to provide  you with the care you need, when you need it.  We recommend signing up for the patient portal called "MyChart".  Sign up information is provided on this After Visit Summary.  MyChart is used to connect with patients for Virtual Visits (Telemedicine).  Patients are able to view lab/test results, encounter notes, upcoming appointments, etc.  Non-urgent messages can be sent to your provider as well.   To learn more about what you can do with MyChart, go to [email or web address].    Your next appointment:   6 month(s)  The format for your next appointment:   In Person  Provider:   Allegra Lai, MD

## 2019-09-11 ENCOUNTER — Other Ambulatory Visit (INDEPENDENT_AMBULATORY_CARE_PROVIDER_SITE_OTHER): Payer: Medicare PPO

## 2019-09-11 DIAGNOSIS — R55 Syncope and collapse: Secondary | ICD-10-CM

## 2019-10-06 ENCOUNTER — Telehealth: Payer: Self-pay | Admitting: *Deleted

## 2019-10-06 NOTE — Telephone Encounter (Signed)
Message received from patient's daughter requesting a call back regarding Covid-19 booster.  Call placed back to patient's daughter, [name] and [name] would like to know if Dr. Marin Olp recommends that his patient's receive the Covid-19 booster.  Informed [name] that Dr. Marin Olp does recommend for his patient's to get the Covid-19 booster at the appointed time according to [facility] guidelines.  [name] is appreciative of call back and has no other questions or concerns at this time.

## 2019-10-09 MED FILL — CALQUENCE 100 MG CAPSULE: 100 | 30 days supply | Qty: 30 | Fill #5

## 2019-10-13 DIAGNOSIS — G309 Alzheimer's disease, unspecified: Secondary | ICD-10-CM | POA: Insufficient documentation

## 2019-10-13 DIAGNOSIS — F02818 Dementia in other diseases classified elsewhere, unspecified severity, with other behavioral disturbance: Secondary | ICD-10-CM | POA: Insufficient documentation

## 2019-10-13 DIAGNOSIS — F0390 Unspecified dementia without behavioral disturbance: Secondary | ICD-10-CM | POA: Insufficient documentation

## 2019-10-22 ENCOUNTER — Encounter: Payer: Self-pay | Admitting: Hematology & Oncology

## 2019-10-22 ENCOUNTER — Telehealth: Payer: Self-pay | Admitting: *Deleted

## 2019-10-22 ENCOUNTER — Other Ambulatory Visit: Payer: Self-pay

## 2019-10-22 ENCOUNTER — Telehealth: Payer: Self-pay | Admitting: Cardiology

## 2019-10-22 ENCOUNTER — Other Ambulatory Visit: Payer: Self-pay | Admitting: *Deleted

## 2019-10-22 ENCOUNTER — Inpatient Hospital Stay: Payer: Medicare PPO | Attending: Hematology & Oncology

## 2019-10-22 ENCOUNTER — Inpatient Hospital Stay (HOSPITAL_BASED_OUTPATIENT_CLINIC_OR_DEPARTMENT_OTHER): Payer: Medicare PPO | Admitting: Hematology & Oncology

## 2019-10-22 VITALS — BP 93/68 | HR 81 | Temp 97.6°F | Resp 18 | Wt 117.0 lb

## 2019-10-22 DIAGNOSIS — R609 Edema, unspecified: Secondary | ICD-10-CM | POA: Insufficient documentation

## 2019-10-22 DIAGNOSIS — C8583 Other specified types of non-Hodgkin lymphoma, intra-abdominal lymph nodes: Secondary | ICD-10-CM | POA: Diagnosis not present

## 2019-10-22 DIAGNOSIS — N390 Urinary tract infection, site not specified: Secondary | ICD-10-CM

## 2019-10-22 DIAGNOSIS — Z79899 Other long term (current) drug therapy: Secondary | ICD-10-CM | POA: Insufficient documentation

## 2019-10-22 DIAGNOSIS — Z8572 Personal history of non-Hodgkin lymphomas: Secondary | ICD-10-CM | POA: Insufficient documentation

## 2019-10-22 DIAGNOSIS — I4891 Unspecified atrial fibrillation: Secondary | ICD-10-CM | POA: Insufficient documentation

## 2019-10-22 DIAGNOSIS — Z9221 Personal history of antineoplastic chemotherapy: Secondary | ICD-10-CM | POA: Insufficient documentation

## 2019-10-22 DIAGNOSIS — Z7901 Long term (current) use of anticoagulants: Secondary | ICD-10-CM | POA: Diagnosis not present

## 2019-10-22 DIAGNOSIS — F0390 Unspecified dementia without behavioral disturbance: Secondary | ICD-10-CM | POA: Insufficient documentation

## 2019-10-22 LAB — URINALYSIS, COMPLETE (UACMP) WITH MICROSCOPIC
Bilirubin Urine: NEGATIVE
Glucose, UA: NEGATIVE mg/dL
Ketones, ur: NEGATIVE mg/dL
Nitrite: NEGATIVE
Protein, ur: NEGATIVE mg/dL
Specific Gravity, Urine: 1.025 (ref 1.005–1.030)
pH: 5.5 (ref 5.0–8.0)

## 2019-10-22 LAB — CBC WITH DIFFERENTIAL (CANCER CENTER ONLY)
Abs Immature Granulocytes: 0.01 10*3/uL (ref 0.00–0.07)
Basophils Absolute: 0.1 10*3/uL (ref 0.0–0.1)
Basophils Relative: 1 %
Eosinophils Absolute: 0.1 10*3/uL (ref 0.0–0.5)
Eosinophils Relative: 2 %
HCT: 44.1 % (ref 36.0–46.0)
Hemoglobin: 14 g/dL (ref 12.0–15.0)
Immature Granulocytes: 0 %
Lymphocytes Relative: 14 %
Lymphs Abs: 0.7 10*3/uL (ref 0.7–4.0)
MCH: 30.7 pg (ref 26.0–34.0)
MCHC: 31.7 g/dL (ref 30.0–36.0)
MCV: 96.7 fL (ref 80.0–100.0)
Monocytes Absolute: 0.4 10*3/uL (ref 0.1–1.0)
Monocytes Relative: 7 %
Neutro Abs: 3.7 10*3/uL (ref 1.7–7.7)
Neutrophils Relative %: 76 %
Platelet Count: 209 10*3/uL (ref 150–400)
RBC: 4.56 MIL/uL (ref 3.87–5.11)
RDW: 13.3 % (ref 11.5–15.5)
WBC Count: 4.9 10*3/uL (ref 4.0–10.5)
nRBC: 0 % (ref 0.0–0.2)

## 2019-10-22 LAB — CMP (CANCER CENTER ONLY)
ALT: 8 U/L (ref 0–44)
AST: 21 U/L (ref 15–41)
Albumin: 4.1 g/dL (ref 3.5–5.0)
Alkaline Phosphatase: 110 U/L (ref 38–126)
Anion gap: 8 (ref 5–15)
BUN: 22 mg/dL (ref 8–23)
CO2: 30 mmol/L (ref 22–32)
Calcium: 9.8 mg/dL (ref 8.9–10.3)
Chloride: 104 mmol/L (ref 98–111)
Creatinine: 1.25 mg/dL — ABNORMAL HIGH (ref 0.44–1.00)
GFR, Est AFR Am: 44 mL/min — ABNORMAL LOW (ref >60)
GFR, Estimated: 38 mL/min — ABNORMAL LOW (ref >60)
Glucose, Bld: 121 mg/dL — ABNORMAL HIGH (ref 70–99)
Potassium: 4.9 mmol/L (ref 3.5–5.1)
Sodium: 142 mmol/L (ref 135–145)
Total Bilirubin: 0.8 mg/dL (ref 0.3–1.2)
Total Protein: 6.3 g/dL — ABNORMAL LOW (ref 6.5–8.1)

## 2019-10-22 NOTE — Telephone Encounter (Signed)
Patient's daughter is following up regarding long term monitor results and medication changes. She states she would like to go over recommendations again for clarification.

## 2019-10-22 NOTE — Telephone Encounter (Signed)
Returned call to dtr. Discussed monitor again, advised to continue monitoring for now, no medication increase. Apologized for not getting back with her sooner, dtr completely understandable and knew this wasn't urgent matter.

## 2019-10-22 NOTE — Telephone Encounter (Signed)
Message received from patient's daughter [name] to notify Dr. Marin Olp that pt.'s son will be bringing her to her appt today and that pt has been very confused today.  Dr. Marin Olp notified.

## 2019-10-22 NOTE — Progress Notes (Signed)
Hematology and Oncology Follow Up Visit  NATARSHA HURWITZ [id number] [birth year] 86 y.o. 10/22/2019   Principle Diagnosis:  Recurrent small B-cell follicular NHL  Current Therapy:   Acalabrutinib 100 mg po q day - started on 02/25/2018 -- d/c on 10/22/2019   Interim History:  Ms. Collyer is here today with her daughter for follow-up.   Overall, she seems to be having more in the way of dementia.  Unknown if she may have a urinary tract infection.  We are going to check her for this.  I really think that we can probably stop the acalabrutinib right now.  Her labs look fantastic.  Her blood smear under the microscope I do not see any abnormal lymphocytes.  Her lymphocytes are now down to 14%.  Maybe, she might improve a little bit been off the acalabrutinib.  Her weight keeps going down.  Again this might be all part of the dementia.  She has had no issues with fever.  She has had no cough or shortness of breath.  She has had no exacerbations of the atrial fibrillation.  She is on Eliquis.  She has had no bleeding.  She does have little bit of chronic edema in her legs.    Overall, I would say her performance status is ECOG 3.    Medications:  Allergies as of 10/22/2019      Reactions   Shellfish Allergy Other (See Comments)   Shrimp Shrimp      Medication List       Accurate as of October 22, 2019  4:04 PM. If you have any questions, ask your nurse or doctor.        Calquence 100 MG capsule Generic drug: acalabrutinib TAKE 1 CAPSULE BY MOUTH AT BEDTIME.   clindamycin 1 % lotion Commonly known as: Cleocin-T Apply topically 2 (two) times daily.   clobetasol ointment 0.05 % Commonly known as: TEMOVATE Apply topically.   diltiazem 60 MG 12 hr capsule Commonly known as: CARDIZEM SR Take 1 tablet (60 mg total) once daily.  You may take a second tablet daily if needed for blood pressure and/or heart rate.   Eliquis 2.5 MG Tabs tablet Generic drug: apixaban TAKE 1 TABLET BY  MOUTH TWICE A DAY       Allergies:  Allergies  Allergen Reactions  . Shellfish Allergy Other (See Comments)    Shrimp Shrimp    Past Medical History, Surgical history, Social history, and Family History were reviewed and updated.  Review of Systems: Review of Systems  Constitutional: Negative.   HENT: Negative.   Respiratory: Negative.   Cardiovascular: Positive for palpitations.  Gastrointestinal: Negative.   Genitourinary: Negative.   Musculoskeletal: Negative.   Skin: Negative.   Neurological: Negative.   Endo/Heme/Allergies: Negative.   Psychiatric/Behavioral: Negative.       Physical Exam:  weight is 117 lb (53.1 kg). Her oral temperature is 97.6 F (36.4 C). Her blood pressure is 93/68 and her pulse is 81. Her respiration is 18 and oxygen saturation is 97%.   Wt Readings from Last 3 Encounters:  10/22/19 117 lb (53.1 kg)  09/08/19 117 lb (53.1 kg)  05/19/19 119 lb 12 oz (54.3 kg)    Physical Exam Vitals reviewed.  HENT:     Head: Normocephalic and atraumatic.  Eyes:     Pupils: Pupils are equal, round, and reactive to light.  Cardiovascular:     Rate and Rhythm: Normal rate and regular rhythm.     Heart  sounds: Normal heart sounds.     Comments: Cardiac exam shows an irregular rate and rhythm consistent with atrial fibrillation.  The rate is well controlled.  There are no murmurs, rubs or bruits. Pulmonary:     Effort: Pulmonary effort is normal.     Breath sounds: Normal breath sounds.  Abdominal:     General: Bowel sounds are normal.     Palpations: Abdomen is soft.     Comments: Soft abdomen with good bowel sounds.  There is no fluid wave.  There is no guarding or rebound tenderness.  She has no palpable inguinal adenopathy.  There is no liver or spleen tip that is palpable.  Musculoskeletal:        General: No tenderness or deformity. Normal range of motion.     Cervical back: Normal range of motion.  Lymphadenopathy:     Cervical: No cervical  adenopathy.  Skin:    General: Skin is warm and dry.     Findings: No erythema or rash.  Neurological:     Mental Status: She is alert and oriented to person, place, and time.  Psychiatric:        Behavior: Behavior normal.        Thought Content: Thought content normal.        Judgment: Judgment normal.      Lab Results  Component Value Date   WBC 4.9 10/22/2019   HGB 14.0 10/22/2019   HCT 44.1 10/22/2019   MCV 96.7 10/22/2019   PLT 209 10/22/2019   Lab Results  Component Value Date   FERRITIN 68 02/14/2018   IRON 170 (H) 02/14/2018   TIBC 308 02/14/2018   UIBC 138 02/14/2018   IRONPCTSAT 55 02/14/2018   Lab Results  Component Value Date   RBC 4.56 10/22/2019   Lab Results  Component Value Date   KPAFRELGTCHN 11.9 05/19/2019   LAMBDASER 9.1 05/19/2019   KAPLAMBRATIO 1.31 05/19/2019   Lab Results  Component Value Date   IGGSERUM 573 (L) 05/19/2019   IGA 66 05/19/2019   IGMSERUM 37 05/19/2019   No results found for: Odetta Pink, SPEI   Chemistry      Component Value Date/Time   NA 142 10/22/2019 1511   NA 144 10/17/2016 1000   K 4.9 10/22/2019 1511   K 4.5 10/17/2016 1000   CL 104 10/22/2019 1511   CL 106 10/17/2016 1000   CO2 30 10/22/2019 1511   CO2 30 10/17/2016 1000   BUN 22 10/22/2019 1511   BUN 18 10/17/2016 1000   CREATININE 1.25 (H) 10/22/2019 1511   CREATININE 1.6 (H) 10/17/2016 1000      Component Value Date/Time   CALCIUM 9.8 10/22/2019 1511   CALCIUM 9.4 10/17/2016 1000   ALKPHOS 110 10/22/2019 1511   ALKPHOS 101 (H) 10/17/2016 1000   AST 21 10/22/2019 1511   ALT 8 10/22/2019 1511   ALT 21 10/17/2016 1000   BILITOT 0.8 10/22/2019 1511       Impression and Plan: Ms. Stolarz is 86 yo white female with recurrent small B-cell follicular NHL. She was originally treated with systemic chemotherapy with Rituxan/Treanda followed by maintenance Rituxan completed in February  2018.  Overall, I think that we can keep her off the acalabrutinib.  Maybe, her status will improve with the dementia.  I really think that her prognosis is good to be dictated by her dementia and by the atrial fibrillation.  I do not see  that we have to do any scans right now.  I cannot find anything on her physical exam that would be suspicious.  We will see what her urinalysis shows.  If she has an infection, we will get her on antibiotics.  I would like to get her back probably in about 6 weeks to see how she is doing.    [name], MD 9/8/20214:04 PM

## 2019-10-23 ENCOUNTER — Other Ambulatory Visit: Payer: Self-pay | Admitting: *Deleted

## 2019-10-23 ENCOUNTER — Telehealth: Payer: Self-pay | Admitting: Hematology & Oncology

## 2019-10-23 DIAGNOSIS — N390 Urinary tract infection, site not specified: Secondary | ICD-10-CM

## 2019-10-23 LAB — LACTATE DEHYDROGENASE: LDH: 179 U/L (ref 98–192)

## 2019-10-23 MED ORDER — AMOXICILLIN-POT CLAVULANATE 875-125 MG PO TABS: 1.0000 | ORAL_TABLET | Freq: Two times a day (BID) | ORAL | 0 refills | Status: DC

## 2019-10-23 NOTE — Telephone Encounter (Signed)
I called daughter and informed her that her mom does have a UTI. Dr. Marin Olp is calling in antibiotics to her pharmacy. She verbalized understanding. She will pick up the prescription for her mom.

## 2019-10-23 NOTE — Telephone Encounter (Signed)
Appointments scheduled calendar printed & mailed per 9/8 los

## 2019-10-24 ENCOUNTER — Other Ambulatory Visit: Payer: Self-pay | Admitting: *Deleted

## 2019-10-24 DIAGNOSIS — N390 Urinary tract infection, site not specified: Secondary | ICD-10-CM

## 2019-10-24 LAB — URINE CULTURE: Culture: 40000 — AB

## 2019-10-24 MED ORDER — CIPROFLOXACIN HCL 500 MG PO TABS: 500.0000 mg | ORAL_TABLET | Freq: Two times a day (BID) | ORAL | 0 refills | Status: DC

## 2019-10-24 NOTE — Telephone Encounter (Signed)
This nurse called and spoke with Kathy Massey regarding changing antibiotics for UTI. Per Dr. Marin Olp, stop taking Augmentin and start Cipro 500 mg po bid x 5 days. Prescription sent to [facility] on [address] per her request. She will pick up the prescription and take it to her mom. She verbalized understanding.

## 2019-11-07 ENCOUNTER — Ambulatory Visit: Payer: Medicare PPO | Admitting: Podiatry

## 2019-11-15 ENCOUNTER — Other Ambulatory Visit: Payer: Self-pay | Admitting: Cardiology

## 2019-11-15 DIAGNOSIS — C8590 Non-Hodgkin lymphoma, unspecified, unspecified site: Secondary | ICD-10-CM

## 2019-11-15 DIAGNOSIS — C8583 Other specified types of non-Hodgkin lymphoma, intra-abdominal lymph nodes: Secondary | ICD-10-CM

## 2019-11-17 NOTE — Telephone Encounter (Signed)
Eliquis 2.5mg  refill request received. Patient is 86 years old, weight-53.1kg, Crea-1.25 on 10/22/2019, Diagnosis-Afib, and last seen by Dr. Curt Bears on 09/08/2019. Dose is appropriate based on dosing criteria. Will send in refill to requested pharmacy.

## 2019-12-03 ENCOUNTER — Inpatient Hospital Stay: Payer: Medicare PPO | Attending: Hematology & Oncology

## 2019-12-03 ENCOUNTER — Encounter: Payer: Self-pay | Admitting: Family

## 2019-12-03 ENCOUNTER — Ambulatory Visit: Payer: Medicare PPO | Admitting: Podiatry

## 2019-12-03 ENCOUNTER — Other Ambulatory Visit: Payer: Self-pay

## 2019-12-03 ENCOUNTER — Telehealth: Payer: Self-pay | Admitting: Family

## 2019-12-03 ENCOUNTER — Inpatient Hospital Stay (HOSPITAL_BASED_OUTPATIENT_CLINIC_OR_DEPARTMENT_OTHER): Payer: Medicare PPO | Admitting: Family

## 2019-12-03 VITALS — BP 106/70 | HR 90 | Temp 97.9°F | Resp 18 | Ht 61.0 in | Wt 119.0 lb

## 2019-12-03 DIAGNOSIS — Z8572 Personal history of non-Hodgkin lymphomas: Secondary | ICD-10-CM | POA: Diagnosis present

## 2019-12-03 DIAGNOSIS — C8583 Other specified types of non-Hodgkin lymphoma, intra-abdominal lymph nodes: Secondary | ICD-10-CM

## 2019-12-03 DIAGNOSIS — Z79899 Other long term (current) drug therapy: Secondary | ICD-10-CM | POA: Insufficient documentation

## 2019-12-03 DIAGNOSIS — R413 Other amnesia: Secondary | ICD-10-CM | POA: Insufficient documentation

## 2019-12-03 LAB — CMP (CANCER CENTER ONLY)
ALT: 4 U/L (ref 0–44)
AST: 23 U/L (ref 15–41)
Albumin: 3.9 g/dL (ref 3.5–5.0)
Alkaline Phosphatase: 133 U/L — ABNORMAL HIGH (ref 38–126)
Anion gap: 7 (ref 5–15)
BUN: 18 mg/dL (ref 8–23)
CO2: 29 mmol/L (ref 22–32)
Calcium: 9.4 mg/dL (ref 8.9–10.3)
Chloride: 105 mmol/L (ref 98–111)
Creatinine: 1.13 mg/dL — ABNORMAL HIGH (ref 0.44–1.00)
GFR, Estimated: 43 mL/min — ABNORMAL LOW (ref >60)
Glucose, Bld: 93 mg/dL (ref 70–99)
Potassium: 4.4 mmol/L (ref 3.5–5.1)
Sodium: 141 mmol/L (ref 135–145)
Total Bilirubin: 0.7 mg/dL (ref 0.3–1.2)
Total Protein: 6.4 g/dL — ABNORMAL LOW (ref 6.5–8.1)

## 2019-12-03 LAB — CBC WITH DIFFERENTIAL (CANCER CENTER ONLY)
Abs Immature Granulocytes: 0.01 10*3/uL (ref 0.00–0.07)
Basophils Absolute: 0 10*3/uL (ref 0.0–0.1)
Basophils Relative: 0 %
Eosinophils Absolute: 0.1 10*3/uL (ref 0.0–0.5)
Eosinophils Relative: 2 %
HCT: 42.6 % (ref 36.0–46.0)
Hemoglobin: 13.6 g/dL (ref 12.0–15.0)
Immature Granulocytes: 0 %
Lymphocytes Relative: 10 %
Lymphs Abs: 0.5 10*3/uL — ABNORMAL LOW (ref 0.7–4.0)
MCH: 30.5 pg (ref 26.0–34.0)
MCHC: 31.9 g/dL (ref 30.0–36.0)
MCV: 95.5 fL (ref 80.0–100.0)
Monocytes Absolute: 0.4 10*3/uL (ref 0.1–1.0)
Monocytes Relative: 8 %
Neutro Abs: 3.6 10*3/uL (ref 1.7–7.7)
Neutrophils Relative %: 80 %
Platelet Count: 207 10*3/uL (ref 150–400)
RBC: 4.46 MIL/uL (ref 3.87–5.11)
RDW: 13.1 % (ref 11.5–15.5)
WBC Count: 4.6 10*3/uL (ref 4.0–10.5)
nRBC: 0 % (ref 0.0–0.2)

## 2019-12-03 NOTE — Progress Notes (Signed)
Hematology and Oncology Follow Up Visit  Kathy Massey [id number] May 06, 1927 86 y.o. 12/03/2019   Principle Diagnosis:  Recurrent small B-cell follicular NHL  Past Therapy:        Acalabrutinib 100 mg po q day - started on 02/25/2018 -- d/c on 10/22/2019  Current Therapy: Observation   Interim History:  Kathy Massey is here today with her daughter for follow-up. She is doing well but still having problems with memory. He daughter states that she was able to see a Geronterology specialist with [facility]. She has been making lifestyle changes but they are unable to notice a difference. They plan to follow-up with her about trying medication.  Her lymphocyte count is 10%.  They do note that since stopping the Acalabrutinib she has not had dizziness in the mornings.  No fever, chills, n/v, cough, rash, dizziness, SOB, chest pain, abdominal pain or changes in bowel or bladder habits.  She states that she rarely has palpitations.  No episodes of bleeding. No bruising or petechiae.  She did have a fall at home where her hip went out. She was assessed by the doctor at her assisted living facility and they felt this was due to dehydration.  No tenderness, numbness or tingling in her extremities. She has puffiness in her feet and ankles that comes and goes.  She is continues to try to hydrate well throughout the day. Her appetite has been good. Her weight is stable at 119 lbs.   ECOG Performance Status: 1 - Symptomatic but completely ambulatory  Medications:  Allergies as of 12/03/2019      Reactions   Shellfish Allergy Itching, Swelling   Shrimp      Medication List       Accurate as of December 03, 2019  1:59 PM. If you have any questions, ask your nurse or doctor.        STOP taking these medications   ciprofloxacin 500 MG tablet Commonly known as: Cipro Stopped by: Kathy Peace, NP     TAKE these medications   Calquence 100 MG capsule Generic drug: acalabrutinib TAKE 1  CAPSULE BY MOUTH AT BEDTIME.   clindamycin 1 % lotion Commonly known as: Cleocin-T Apply topically 2 (two) times daily.   clobetasol ointment 0.05 % Commonly known as: TEMOVATE Apply topically.   diltiazem 60 MG 12 hr capsule Commonly known as: CARDIZEM SR Take 1 tablet (60 mg total) once daily.  You may take a second tablet daily if needed for blood pressure and/or heart rate.   Eliquis 2.5 MG Tabs tablet Generic drug: apixaban TAKE 1 TABLET BY MOUTH TWICE A DAY       Allergies:  Allergies  Allergen Reactions  . Shellfish Allergy Itching and Swelling    Shrimp     Past Medical History, Surgical history, Social history, and Family History were reviewed and updated.  Review of Systems: All other 10 point review of systems is negative.   Physical Exam:  height is 5\' 1"  (1.549 m) and weight is 119 lb (54 kg). Her oral temperature is 97.9 F (36.6 C). Her blood pressure is 106/70 and her pulse is 90. Her respiration is 18 and oxygen saturation is 98%.   Wt Readings from Last 3 Encounters:  12/03/19 119 lb (54 kg)  10/22/19 117 lb (53.1 kg)  09/08/19 117 lb (53.1 kg)    Ocular: Sclerae unicteric, pupils equal, round and reactive to light Ear-nose-throat: Oropharynx clear, dentition fair Lymphatic: No cervical or supraclavicular adenopathy Lungs  no rales or rhonchi, good excursion bilaterally Heart regular rate and rhythm, no murmur appreciated Abd soft, nontender, positive bowel sounds MSK no focal spinal tenderness, no joint edema Neuro: non-focal, well-oriented, appropriate affect Breasts: Deferred   Lab Results  Component Value Date   WBC 4.6 12/03/2019   HGB 13.6 12/03/2019   HCT 42.6 12/03/2019   MCV 95.5 12/03/2019   PLT 207 12/03/2019   Lab Results  Component Value Date   FERRITIN 68 02/14/2018   IRON 170 (H) 02/14/2018   TIBC 308 02/14/2018   UIBC 138 02/14/2018   IRONPCTSAT 55 02/14/2018   Lab Results  Component Value Date   RBC 4.46  12/03/2019   Lab Results  Component Value Date   KPAFRELGTCHN 11.9 05/19/2019   LAMBDASER 9.1 05/19/2019   KAPLAMBRATIO 1.31 05/19/2019   Lab Results  Component Value Date   IGGSERUM 573 (L) 05/19/2019   IGA 66 05/19/2019   IGMSERUM 37 05/19/2019   No results found for: Odetta Pink, SPEI   Chemistry      Component Value Date/Time   NA 141 12/03/2019 1322   NA 144 10/17/2016 1000   K 4.4 12/03/2019 1322   K 4.5 10/17/2016 1000   CL 105 12/03/2019 1322   CL 106 10/17/2016 1000   CO2 29 12/03/2019 1322   CO2 30 10/17/2016 1000   BUN 18 12/03/2019 1322   BUN 18 10/17/2016 1000   CREATININE 1.13 (H) 12/03/2019 1322   CREATININE 1.6 (H) 10/17/2016 1000      Component Value Date/Time   CALCIUM 9.4 12/03/2019 1322   CALCIUM 9.4 10/17/2016 1000   ALKPHOS 133 (H) 12/03/2019 1322   ALKPHOS 101 (H) 10/17/2016 1000   AST 23 12/03/2019 1322   ALT 4 12/03/2019 1322   ALT 21 10/17/2016 1000   BILITOT 0.7 12/03/2019 1322       Impression and Plan: KathyMassey 86 yo white female with recurrent small B-cell follicular NHL. She was originally treated with systemic chemotherapy with Rituxan/Treanda followed by maintenance Rituxan completed in February 2018. She feels better off of the Acalabrutinib and seems to be doing well.  She will continue to work on proper hydrated.  We will plan see her again in another 8 weeks.  They were encouraged to contact our office with any questions or concerns.    Kathy Peace, NP 10/20/20211:59 PM

## 2019-12-03 NOTE — Telephone Encounter (Signed)
Appointments scheduled calendar printed & mailed per 10/20 los  

## 2019-12-04 LAB — LACTATE DEHYDROGENASE: LDH: 174 U/L (ref 98–192)

## 2020-01-14 ENCOUNTER — Ambulatory Visit: Payer: Medicare PPO | Admitting: Podiatry

## 2020-02-04 ENCOUNTER — Inpatient Hospital Stay: Payer: Medicare PPO | Attending: Hematology & Oncology

## 2020-02-04 ENCOUNTER — Other Ambulatory Visit: Payer: Self-pay

## 2020-02-04 ENCOUNTER — Inpatient Hospital Stay (HOSPITAL_BASED_OUTPATIENT_CLINIC_OR_DEPARTMENT_OTHER): Payer: Medicare PPO | Admitting: Hematology & Oncology

## 2020-02-04 ENCOUNTER — Encounter: Payer: Self-pay | Admitting: Hematology & Oncology

## 2020-02-04 ENCOUNTER — Telehealth: Payer: Self-pay

## 2020-02-04 VITALS — BP 103/81 | HR 72 | Temp 97.6°F | Resp 20 | Wt 117.0 lb

## 2020-02-04 DIAGNOSIS — C8583 Other specified types of non-Hodgkin lymphoma, intra-abdominal lymph nodes: Secondary | ICD-10-CM | POA: Diagnosis not present

## 2020-02-04 DIAGNOSIS — C8298 Follicular lymphoma, unspecified, lymph nodes of multiple sites: Secondary | ICD-10-CM | POA: Diagnosis present

## 2020-02-04 DIAGNOSIS — M7989 Other specified soft tissue disorders: Secondary | ICD-10-CM | POA: Diagnosis not present

## 2020-02-04 DIAGNOSIS — I482 Chronic atrial fibrillation, unspecified: Secondary | ICD-10-CM | POA: Insufficient documentation

## 2020-02-04 DIAGNOSIS — Z9221 Personal history of antineoplastic chemotherapy: Secondary | ICD-10-CM | POA: Diagnosis not present

## 2020-02-04 DIAGNOSIS — R42 Dizziness and giddiness: Secondary | ICD-10-CM | POA: Diagnosis not present

## 2020-02-04 LAB — CBC WITH DIFFERENTIAL (CANCER CENTER ONLY)
Abs Immature Granulocytes: 0.01 10*3/uL (ref 0.00–0.07)
Basophils Absolute: 0.1 10*3/uL (ref 0.0–0.1)
Basophils Relative: 1 %
Eosinophils Absolute: 0.1 10*3/uL (ref 0.0–0.5)
Eosinophils Relative: 2 %
HCT: 44.1 % (ref 36.0–46.0)
Hemoglobin: 14 g/dL (ref 12.0–15.0)
Immature Granulocytes: 0 %
Lymphocytes Relative: 12 %
Lymphs Abs: 0.6 10*3/uL — ABNORMAL LOW (ref 0.7–4.0)
MCH: 29.9 pg (ref 26.0–34.0)
MCHC: 31.7 g/dL (ref 30.0–36.0)
MCV: 94.2 fL (ref 80.0–100.0)
Monocytes Absolute: 0.4 10*3/uL (ref 0.1–1.0)
Monocytes Relative: 7 %
Neutro Abs: 3.8 10*3/uL (ref 1.7–7.7)
Neutrophils Relative %: 78 %
Platelet Count: 262 10*3/uL (ref 150–400)
RBC: 4.68 MIL/uL (ref 3.87–5.11)
RDW: 13.9 % (ref 11.5–15.5)
WBC Count: 5 10*3/uL (ref 4.0–10.5)
nRBC: 0 % (ref 0.0–0.2)

## 2020-02-04 LAB — CMP (CANCER CENTER ONLY)
ALT: 8 U/L (ref 0–44)
AST: 22 U/L (ref 15–41)
Albumin: 4.1 g/dL (ref 3.5–5.0)
Alkaline Phosphatase: 136 U/L — ABNORMAL HIGH (ref 38–126)
Anion gap: 8 (ref 5–15)
BUN: 24 mg/dL — ABNORMAL HIGH (ref 8–23)
CO2: 29 mmol/L (ref 22–32)
Calcium: 9.7 mg/dL (ref 8.9–10.3)
Chloride: 105 mmol/L (ref 98–111)
Creatinine: 1.13 mg/dL — ABNORMAL HIGH (ref 0.44–1.00)
GFR, Estimated: 46 mL/min — ABNORMAL LOW (ref >60)
Glucose, Bld: 90 mg/dL (ref 70–99)
Potassium: 4.4 mmol/L (ref 3.5–5.1)
Sodium: 142 mmol/L (ref 135–145)
Total Bilirubin: 1 mg/dL (ref 0.3–1.2)
Total Protein: 6.5 g/dL (ref 6.5–8.1)

## 2020-02-04 LAB — LACTATE DEHYDROGENASE: LDH: 154 U/L (ref 98–192)

## 2020-02-04 NOTE — Telephone Encounter (Signed)
appts made and printed for pt per 02/04/20 los.Marland KitchenMarland KitchenAOM

## 2020-02-04 NOTE — Progress Notes (Signed)
Hematology and Oncology Follow Up Visit  Kathy Massey [id number] 09-25-1927 86 y.o. 02/04/2020   Principle Diagnosis:  Recurrent small B-cell follicular NHL  Past Therapy:        Acalabrutinib 100 mg po q day - started on 02/25/2018 -- d/c on 10/22/2019  Current Therapy: Observation   Interim History:  Kathy Massey is here today with her daughter for follow-up.  Overall, she seems to be holding her own.  She now is at [facility].  This seems to be doing pretty well for her.  They have facilities can help her.  She does do a lot of walking.  Her big problem is dizziness.  I think she is seeing other doctors for this.  The dizziness is not constant.  She has had no problems with swollen lymph nodes.  She has had no problems with nausea or vomiting.  There is no issues with her bowels or bladder.  She is 86 years old now.  She is holding her own quite nicely.  She has had no rashes.  She does have some leg swelling.  She has chronic atrial fibrillation.  Thankfully, I will think there is been any obvious neurological deficit caused by this.  I would have to say that performance status right now is by ECOG 3.    Medications:  Allergies as of 02/04/2020      Reactions   Shellfish Allergy Itching, Swelling   Shrimp      Medication List       Accurate as of February 04, 2020  1:50 PM. If you have any questions, ask your nurse or doctor.        Calquence 100 MG capsule Generic drug: acalabrutinib TAKE 1 CAPSULE BY MOUTH AT BEDTIME.   clindamycin 1 % lotion Commonly known as: Cleocin-T Apply topically 2 (two) times daily.   clobetasol ointment 0.05 % Commonly known as: TEMOVATE Apply topically.   diltiazem 60 MG 12 hr capsule Commonly known as: CARDIZEM SR Take 1 tablet (60 mg total) once daily.  You may take a second tablet daily if needed for blood pressure and/or heart rate.   Eliquis 2.5 MG Tabs tablet Generic drug: apixaban TAKE 1 TABLET BY MOUTH TWICE A  DAY       Allergies:  Allergies  Allergen Reactions  . Shellfish Allergy Itching and Swelling    Shrimp     Past Medical History, Surgical history, Social history, and Family History were reviewed and updated.  Review of Systems: Review of Systems  Constitutional: Negative.   HENT: Positive for hearing loss.   Eyes: Positive for blurred vision.  Respiratory: Negative.   Cardiovascular: Positive for palpitations.  Gastrointestinal: Negative.   Genitourinary: Negative.   Musculoskeletal: Negative.   Skin: Negative.   Neurological: Positive for dizziness.  Endo/Heme/Allergies: Negative.   Psychiatric/Behavioral: Negative.      Physical Exam:  weight is 117 lb (53.1 kg). Her oral temperature is 97.6 F (36.4 C). Her blood pressure is 103/81 and her pulse is 72. Her respiration is 20 and oxygen saturation is 98%.   Wt Readings from Last 3 Encounters:  02/04/20 117 lb (53.1 kg)  12/03/19 119 lb (54 kg)  10/22/19 117 lb (53.1 kg)    Physical Exam Vitals reviewed.  HENT:     Head: Normocephalic and atraumatic.     Mouth/Throat:     Mouth: Oropharynx is clear and moist.  Eyes:     Extraocular Movements: EOM normal.  Pupils: Pupils are equal, round, and reactive to light.  Cardiovascular:     Rate and Rhythm: Normal rate and regular rhythm.     Heart sounds: Normal heart sounds.     Comments: Cardiac exam shows irregular rate and irregular rhythm consistent with atrial fibrillation.  The rate is fairly well controlled.  She has no murmurs, rubs or bruits. Pulmonary:     Effort: Pulmonary effort is normal.     Breath sounds: Normal breath sounds.  Abdominal:     General: Bowel sounds are normal.     Palpations: Abdomen is soft.  Musculoskeletal:        General: No tenderness, deformity or edema. Normal range of motion.     Cervical back: Normal range of motion.     Comments: Her extremities does show some nonpitting edema in the lower legs.  This is mild.  She  has arthritic changes in her joints.  Lymphadenopathy:     Cervical: No cervical adenopathy.  Skin:    General: Skin is warm and dry.     Findings: No erythema or rash.  Neurological:     Mental Status: She is alert and oriented to person, place, and time.  Psychiatric:        Mood and Affect: Mood and affect normal.        Behavior: Behavior normal.        Thought Content: Thought content normal.        Judgment: Judgment normal.     Lab Results  Component Value Date   WBC 5.0 02/04/2020   HGB 14.0 02/04/2020   HCT 44.1 02/04/2020   MCV 94.2 02/04/2020   PLT 262 02/04/2020   Lab Results  Component Value Date   FERRITIN 68 02/14/2018   IRON 170 (H) 02/14/2018   TIBC 308 02/14/2018   UIBC 138 02/14/2018   IRONPCTSAT 55 02/14/2018   Lab Results  Component Value Date   RBC 4.68 02/04/2020   Lab Results  Component Value Date   KPAFRELGTCHN 11.9 05/19/2019   LAMBDASER 9.1 05/19/2019   KAPLAMBRATIO 1.31 05/19/2019   Lab Results  Component Value Date   IGGSERUM 573 (L) 05/19/2019   IGA 66 05/19/2019   IGMSERUM 37 05/19/2019   No results found for: Odetta Pink, SPEI   Chemistry      Component Value Date/Time   NA 142 02/04/2020 1305   NA 144 10/17/2016 1000   K 4.4 02/04/2020 1305   K 4.5 10/17/2016 1000   CL 105 02/04/2020 1305   CL 106 10/17/2016 1000   CO2 29 02/04/2020 1305   CO2 30 10/17/2016 1000   BUN 24 (H) 02/04/2020 1305   BUN 18 10/17/2016 1000   CREATININE 1.13 (H) 02/04/2020 1305   CREATININE 1.6 (H) 10/17/2016 1000      Component Value Date/Time   CALCIUM 9.7 02/04/2020 1305   CALCIUM 9.4 10/17/2016 1000   ALKPHOS 136 (H) 02/04/2020 1305   ALKPHOS 101 (H) 10/17/2016 1000   AST 22 02/04/2020 1305   ALT 8 02/04/2020 1305   ALT 21 10/17/2016 1000   BILITOT 1.0 02/04/2020 1305       Impression and Plan: Kathy Massey 86 yo white female with recurrent small B-cell follicular NHL. She  was originally treated with systemic chemotherapy with Rituxan/Treanda followed by maintenance Rituxan completed in February 2018.  She was treated with acalabrutinib for recurrent disease.  She did well with this for a while  and then began to have some toxicity so we stopped it.  By her blood counts and blood smear, I do not see any problems with recurrence.  Again, quality of life is clearly our goal here.  We really want her to enjoy quality of life.  Hopefully, the dizziness will not be a real problem for her.  Hopefully she has had no issues with falling.  We will plan to get her back to see [place] in another 3 or 4 months.     [name], MD 12/22/20211:50 PM

## 2020-02-05 LAB — BETA 2 MICROGLOBULIN, SERUM: Beta-2 Microglobulin: 2.1 mg/L (ref 0.6–2.4)

## 2020-03-22 ENCOUNTER — Ambulatory Visit: Payer: Medicare PPO | Admitting: Cardiology

## 2020-03-22 ENCOUNTER — Encounter: Payer: Self-pay | Admitting: Cardiology

## 2020-03-22 ENCOUNTER — Other Ambulatory Visit: Payer: Self-pay

## 2020-03-22 VITALS — BP 133/84 | HR 112 | Ht 61.0 in | Wt 119.0 lb

## 2020-03-22 DIAGNOSIS — I4821 Permanent atrial fibrillation: Secondary | ICD-10-CM

## 2020-03-22 MED ORDER — MECLIZINE HCL 12.5 MG PO TABS: 12.5000 mg | ORAL_TABLET | Freq: Two times a day (BID) | ORAL | 1 refills | Status: DC | PRN

## 2020-03-22 NOTE — Patient Instructions (Signed)
Medication Instructions:  Your physician has recommended you make the following change in your medication:  1. TAKE Meclizine 12.5 mg twice a day AS NEEDED for dizziness  *If you need a refill on your cardiac medications before your next appointment, please call your pharmacy*   Lab Work: None ordered   Testing/Procedures: None ordered   Follow-Up: At [facility], you and your health needs are our priority.  As part of our continuing mission to provide you with exceptional heart care, we have created designated Provider Care Teams.  These Care Teams include your primary Cardiologist (physician) and Advanced Practice Providers (APPs -  Physician Assistants and Nurse Practitioners) who all work together to provide you with the care you need, when you need it.  Your next appointment:   6 month(s)  The format for your next appointment:   In Person  Provider:   Allegra Lai, MD    Thank you for choosing [facility]!!   Trinidad Curet, RN [phone]   Other Instructions  Meclizine chewable tablets What is this medicine? MECLIZINE (MEK li zeen) is an antihistamine. It is used to prevent nausea, vomiting, or dizziness caused by motion sickness. It is also used to prevent and treat vertigo (extreme dizziness or a feeling that you or your surroundings are tilting or spinning around). This medicine may be used for other purposes; ask your health care provider or pharmacist if you have questions. COMMON BRAND NAME(S): Antivert, Bonine, Travel Sickness What should I tell my health care provider before I take this medicine? They need to know if you have any of these conditions:  glaucoma  lung or breathing disease, like asthma  problems urinating  prostate disease  stomach or intestine problems  an unusual or allergic reaction to meclizine, other medicines, foods, dyes, or preservatives  pregnant or trying to get pregnant  breast-feeding How should I use this  medicine? Take this medicine by mouth with a glass of water. Chew it completely or swallow whole. Follow the directions on the prescription label. If you are using this medicine to prevent motion sickness, take the dose at least 1 hour before travel. If it upsets your stomach, take it with food or milk. Take your doses at regular intervals. Do not take your medicine more often than directed. Talk to your pediatrician regarding the use of this medicine in children. While this drug may be prescribed for selected conditions, precautions do apply. Overdosage: If you think you have taken too much of this medicine contact a poison control center or emergency room at once. NOTE: This medicine is only for you. Do not share this medicine with others. What if I miss a dose? If you miss a dose, take it as soon as you can. If it is almost time for your next dose, take only that dose. Do not take double or extra doses. What may interact with this medicine? Do not take this medicine with any of the following medications:  MAOIs like Carbex, Eldepryl, Marplan, Nardil, and Parnate This medicine may also interact with the following medications:  alcohol  antihistamines for allergy, cough and cold  certain medicines for anxiety or sleep  certain medicines for depression, like amitriptyline, fluoxetine, sertraline  certain medicines for seizures like phenobarbital, primidone  general anesthetics like halothane, isoflurane, methoxyflurane, propofol  local anesthetics like lidocaine, pramoxine, tetracaine  medicines that relax muscles for surgery  narcotic medicines for pain  phenothiazines like chlorpromazine, mesoridazine, prochlorperazine, thioridazine This list may not describe  all possible interactions. Give your health care provider a list of all the medicines, herbs, non-prescription drugs, or dietary supplements you use. Also tell them if you smoke, drink alcohol, or use illegal drugs. Some items  may interact with your medicine. What should I watch for while using this medicine? Tell your doctor or healthcare professional if your symptoms do not start to get better or if they get worse. You may get drowsy or dizzy. Do not drive, use machinery, or do anything that needs mental alertness until you know how this medicine affects you. Do not stand or sit up quickly, especially if you are an older patient. This reduces the risk of dizzy or fainting spells. Alcohol may interfere with the effect of this medicine. Avoid alcoholic drinks. Your mouth may get dry. Chewing sugarless gum or sucking hard candy, and drinking plenty of water may help. Contact your doctor if the problem does not go away or is severe. This medicine may cause dry eyes and blurred vision. If you wear contact lenses you may feel some discomfort. Lubricating drops may help. See your eye doctor if the problem does not go away or is severe. What side effects may I notice from receiving this medicine? Side effects that you should report to your doctor or health care professional as soon as possible:  feeling faint or lightheaded, falls  fast, irregular heartbeat Side effects that usually do not require medical attention (report to your doctor or health care professional if they continue or are bothersome):  constipation  headache  trouble passing urine or change in the amount of urine  trouble sleeping  upset stomach This list may not describe all possible side effects. Call your doctor for medical advice about side effects. You may report side effects to FDA at 1-800-FDA-1088. Where should I keep my medicine? Keep out of the reach of children. Store at room temperature between 15 and 30 degrees C (59 and 86 degrees F). Keep container tightly closed. Throw away any unused medicine after the expiration date. NOTE: This sheet is a summary. It may not cover all possible information. If you have questions about this medicine,  talk to your doctor, pharmacist, or health care provider.  2021 Elsevier/Gold Standard (2015-03-04 10:44:29)

## 2020-03-22 NOTE — Progress Notes (Signed)
Electrophysiology Office Note   Date:  03/22/2020   ID:  Kathy Massey, DOB 04-07-1927, MRN [id number]  PCP:  Kathy Massey., MD  Cardiologist:   Primary Electrophysiologist:  Dr Curt Bears    CC: Follow up for atrial fibrillation   History of Present Illness: Kathy Massey is a 87 y.o. female who is being seen today for the evaluation of atrial fibrillation at the request of Kathy Massey.,*. Presenting today for electrophysiology evaluation.    She has a history of stress-induced cardiomyopathy and atrial fibrillation.  Her atrial fibrillation was initially diagnosed and she was persistent, but she had a lack of symptoms and opted for a rate control strategy.  Today, denies symptoms of palpitations, chest pain, shortness of breath, orthopnea, PND, lower extremity edema, claudication, presyncope, syncope, bleeding, or neurologic sequela. The patient is tolerating medications without difficulties.  For the most part she feels well.  She is able to do all of her daily activities.  Per her daughter, she has started having significant memory issues.  She continues to be dizzy.  Dizziness occurs at all times of the day.  Dizziness mostly occurs when she is turning her head from side to side.  No dizziness when she stands up.  Past Medical History:  Diagnosis Date  . Arthritis    hands -oa  . Dysrhythmia    afib  . Heart murmur   . Hypertension   . 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 Myocardial infarction ([facility])    06/15/15: stress induced cardiomyopathy   Past Surgical History:  Procedure Laterality Date  . CARDIAC CATHETERIZATION     06/17/15 [facility]): No CAD, LVEDP 9 mmHg, borderline LV contraction with apical akinesis, suggestive of stress-induced CM. EF > 55% by 06/16/15 echo.  . ELBOW BURSA SURGERY Right 2016  . EYE SURGERY     cataracts removed ,both eyes, /w IOL  . LYMPH NODE BIOPSY Left 01/14/2018   Procedure: EXCISION DEEP LEFT  INGUINAL LYMPH NODE BIOPSY ERAS PATHWAY;  Surgeon: Fanny Skates, MD;  Location: [facility];  Service: General;  Laterality: Left;  . TONSILLECTOMY       Current Outpatient Medications  Medication Sig Dispense Refill  . atorvastatin (LIPITOR) 20 MG tablet Take 20 mg by mouth daily.    . clindamycin (CLEOCIN-T) 1 % lotion Apply topically 2 (two) times daily. 60 mL 3  . clobetasol ointment (TEMOVATE) 0.05 % Apply topically.    Marland Kitchen diltiazem (CARDIZEM SR) 60 MG 12 hr capsule Take 1 tablet (60 mg total) once daily.  You may take a second tablet daily if needed for blood pressure and/or heart rate. 60 capsule 2  . donepezil (ARICEPT) 5 MG tablet Take 5 mg by mouth at bedtime.    Marland Kitchen ELIQUIS 2.5 MG TABS tablet TAKE 1 TABLET BY MOUTH TWICE A DAY 60 tablet 8  . meclizine (ANTIVERT) 12.5 MG tablet Take 1 tablet (12.5 mg total) by mouth 2 (two) times daily as needed for dizziness. 30 tablet 1   No current facility-administered medications for this visit.    Allergies:   Shellfish allergy   Social History:  The patient  reports that she has never smoked. She has never used smokeless tobacco. She reports current alcohol use of about 1.0 standard drink of alcohol per week. She reports that she does not use drugs.   Family History:  The patient's family history includes Alcohol abuse in her father and maternal grandfather; Breast  cancer in her daughter and paternal grandmother; Depression in her father and mother; Heart attack in her father; Heart disease in her father and mother; Heart failure in her mother; Multiple myeloma in her maternal grandmother.   ROS:  Please see the history of present illness.   Otherwise, review of systems is positive for none.   All other systems are reviewed and negative.   PHYSICAL EXAM: VS:  BP 133/84   Pulse (!) 112   Ht '5\' 1"'  (1.549 m)   Wt 119 lb (54 kg)   SpO2 93%   BMI 22.48 kg/m  , BMI Body mass index is 22.48 kg/m. GEN: Well nourished, well developed, in no acute  distress  HEENT: normal  Neck: no JVD, carotid bruits, or masses Cardiac: irregular; no murmurs, rubs, or gallops,no edema  Respiratory:  clear to auscultation bilaterally, normal work of breathing GI: soft, nontender, nondistended, + BS MS: no deformity or atrophy  Skin: warm and dry Neuro:  Strength and sensation are intact Psych: euthymic mood, full affect  EKG:  EKG is ordered today. Personal review of the ekg ordered shows atrial fibrillation, rate 115  Recent Labs: 02/04/2020: ALT 8; BUN 24; Creatinine 1.13; Hemoglobin 14.0; Platelet Count 262; Potassium 4.4; Sodium 142    Lipid Panel  No results found for: CHOL, TRIG, HDL, CHOLHDL, VLDL, LDLCALC, LDLDIRECT   Wt Readings from Last 3 Encounters:  03/22/20 119 lb (54 kg)  02/04/20 117 lb (53.1 kg)  12/03/19 119 lb (54 kg)      Other studies Reviewed: Additional studies/ records that were reviewed today include: TTE 06/16/15  Review of the above records today demonstrates:   Normal left ventricular systolic function, ejection fraction > 96%  Diastolic dysfunction - grade I (normal filling pressures)  Dilated left atrium - mild  Degenerative mitral valve disease  Tricuspid regurgitation - mild  Pulmonic regurgitation - mild  Normal right ventricular systolic function  Dilated right atrium - mild  Cardiac cath 06/15/15 1. No angiographically apparent coronary artery disease 2. Normal LV filling pressures  Cardiac monitor 10/03/2019 personally reviewed Max 173 bpm 10:06pm, 08/01 Min 54 bpm 08:50am, 07/30 Avg 96 bpm <1% PVCs 100% atrial fibrillation burden noted Triggered event associated with AF, rate 99-134   ASSESSMENT AND PLAN:  1.  Permanent atrial fibrillation: Currently on Eliquis and diltiazem.  CHA2DS2-VASc of 3.  She remains in atrial fibrillation with mildly elevated rates.  She felt quite poorly when we tried to increase her rate control.  No changes.  2.  Stress-induced cardiomyopathy: Ejection  fraction has since normalized.  No changes.  3.  Dizziness: Dizziness is very nonspecific.  Unlikely cardiac in nature.  We Tawonna Esquer try meclizine to see if this improves her symptoms.  Current medicines are reviewed at length with the patient today.   The patient does not have concerns regarding her medicines.  The following changes were made today: Start meclizine  Labs/ tests ordered today include:  Orders Placed This Encounter  Procedures  . EKG 12-Lead     Disposition:   FU with Jartavious Mckimmy 6 months  Signed, Yacine Droz Meredith Leeds, MD  03/22/2020 3:48 PM     [facility] [address] [place] [address] [phone] (office) [phone] (fax)

## 2020-04-13 DIAGNOSIS — F0280 Dementia in other diseases classified elsewhere without behavioral disturbance: Secondary | ICD-10-CM | POA: Insufficient documentation

## 2020-04-13 DIAGNOSIS — F0150 Vascular dementia without behavioral disturbance: Secondary | ICD-10-CM | POA: Insufficient documentation

## 2020-04-15 ENCOUNTER — Telehealth: Payer: Self-pay

## 2020-04-15 ENCOUNTER — Inpatient Hospital Stay: Payer: Medicare PPO | Admitting: Hematology & Oncology

## 2020-04-15 ENCOUNTER — Inpatient Hospital Stay: Payer: Medicare PPO | Attending: Hematology & Oncology

## 2020-04-15 ENCOUNTER — Other Ambulatory Visit: Payer: Self-pay

## 2020-04-15 ENCOUNTER — Encounter: Payer: Self-pay | Admitting: Hematology & Oncology

## 2020-04-15 VITALS — BP 106/64 | HR 95 | Temp 97.7°F | Resp 20 | Wt 116.1 lb

## 2020-04-15 DIAGNOSIS — C8583 Other specified types of non-Hodgkin lymphoma, intra-abdominal lymph nodes: Secondary | ICD-10-CM | POA: Diagnosis not present

## 2020-04-15 DIAGNOSIS — Z79899 Other long term (current) drug therapy: Secondary | ICD-10-CM | POA: Insufficient documentation

## 2020-04-15 DIAGNOSIS — I4891 Unspecified atrial fibrillation: Secondary | ICD-10-CM | POA: Insufficient documentation

## 2020-04-15 DIAGNOSIS — Z5111 Encounter for antineoplastic chemotherapy: Secondary | ICD-10-CM | POA: Insufficient documentation

## 2020-04-15 DIAGNOSIS — C8298 Follicular lymphoma, unspecified, lymph nodes of multiple sites: Secondary | ICD-10-CM | POA: Diagnosis not present

## 2020-04-15 DIAGNOSIS — R42 Dizziness and giddiness: Secondary | ICD-10-CM | POA: Insufficient documentation

## 2020-04-15 LAB — CMP (CANCER CENTER ONLY)
ALT: 8 U/L (ref 0–44)
AST: 25 U/L (ref 15–41)
Albumin: 4.4 g/dL (ref 3.5–5.0)
Alkaline Phosphatase: 115 U/L (ref 38–126)
Anion gap: 8 (ref 5–15)
BUN: 26 mg/dL — ABNORMAL HIGH (ref 8–23)
CO2: 29 mmol/L (ref 22–32)
Calcium: 10.1 mg/dL (ref 8.9–10.3)
Chloride: 104 mmol/L (ref 98–111)
Creatinine: 1.27 mg/dL — ABNORMAL HIGH (ref 0.44–1.00)
GFR, Estimated: 40 mL/min — ABNORMAL LOW (ref >60)
Glucose, Bld: 106 mg/dL — ABNORMAL HIGH (ref 70–99)
Potassium: 4.3 mmol/L (ref 3.5–5.1)
Sodium: 141 mmol/L (ref 135–145)
Total Bilirubin: 1.3 mg/dL — ABNORMAL HIGH (ref 0.3–1.2)
Total Protein: 6.4 g/dL — ABNORMAL LOW (ref 6.5–8.1)

## 2020-04-15 LAB — CBC WITH DIFFERENTIAL (CANCER CENTER ONLY)
Abs Immature Granulocytes: 0.02 10*3/uL (ref 0.00–0.07)
Basophils Absolute: 0.1 10*3/uL (ref 0.0–0.1)
Basophils Relative: 1 %
Eosinophils Absolute: 0.1 10*3/uL (ref 0.0–0.5)
Eosinophils Relative: 2 %
HCT: 42 % (ref 36.0–46.0)
Hemoglobin: 13.8 g/dL (ref 12.0–15.0)
Immature Granulocytes: 0 %
Lymphocytes Relative: 11 %
Lymphs Abs: 0.7 10*3/uL (ref 0.7–4.0)
MCH: 30.1 pg (ref 26.0–34.0)
MCHC: 32.9 g/dL (ref 30.0–36.0)
MCV: 91.7 fL (ref 80.0–100.0)
Monocytes Absolute: 0.4 10*3/uL (ref 0.1–1.0)
Monocytes Relative: 7 %
Neutro Abs: 4.8 10*3/uL (ref 1.7–7.7)
Neutrophils Relative %: 79 %
Platelet Count: 218 10*3/uL (ref 150–400)
RBC: 4.58 MIL/uL (ref 3.87–5.11)
RDW: 14.5 % (ref 11.5–15.5)
WBC Count: 6.1 10*3/uL (ref 4.0–10.5)
nRBC: 0 % (ref 0.0–0.2)

## 2020-04-15 LAB — LACTATE DEHYDROGENASE: LDH: 171 U/L (ref 98–192)

## 2020-04-15 MED ORDER — FLUDROCORTISONE ACETATE 0.1 MG PO TABS: 0.1000 mg | ORAL_TABLET | Freq: Every day | ORAL | 5 refills | Status: DC

## 2020-04-15 NOTE — Progress Notes (Signed)
Hematology and Oncology Follow Up Visit  Kathy Massey [id number] [birth year] 87 y.o. 04/15/2020   Principle Diagnosis:  Recurrent small B-cell follicular NHL  Past Therapy:        Acalabrutinib 100 mg po q day - started on 02/25/2018 -- d/c on 10/22/2019  Current Therapy: Observation   Interim History:  Kathy Massey is here today with her daughter for follow-up.  We last saw her back in December.  She has been doing okay although she still has dizziness.  I just wonder if she may not be having some orthostatic hypotension.  This seems to have been mostly in the morning.  I am going to try her on some Florinef (0.1 mg p.o. daily) I see this may not help.  I told her daughter that if there is no change in 3 weeks, just to stop this.  Otherwise she seems to be doing okay.  She has had a couple falls.  She is off anticoagulation.  She does have atrial fibrillation which is rate controlled.  She has had no problems with fever.  There is been no cough.  She is had no nausea or vomiting.  There has been no bowel or bladder incontinence.  She lives at [place].  She enjoys living there.  Her family does a good job in getting her out and about.  Overall, her performance status is ECOG 3.    Medications:  Allergies as of 04/15/2020      Reactions   Shellfish Allergy Itching, Swelling   Shrimp      Medication List       Accurate as of April 15, 2020  3:13 PM. If you have any questions, ask your nurse or doctor.        STOP taking these medications   donepezil 5 MG tablet Commonly known as: ARICEPT Stopped by: Kathy Napoleon, MD   meclizine 12.5 MG tablet Commonly known as: ANTIVERT Stopped by: Kathy Napoleon, MD     TAKE these medications   atorvastatin 20 MG tablet Commonly known as: LIPITOR Take 20 mg by mouth daily.   clindamycin 1 % lotion Commonly known as: Cleocin-T Apply topically 2 (two) times daily.   clobetasol ointment 0.05 % Commonly known as:  TEMOVATE Apply topically.   diltiazem 60 MG 12 hr capsule Commonly known as: CARDIZEM SR Take 1 tablet (60 mg total) once daily.  You may take a second tablet daily if needed for blood pressure and/or heart rate.   doxycycline 50 MG capsule Commonly known as: VIBRAMYCIN Take 50 mg by mouth daily.   Eliquis 2.5 MG Tabs tablet Generic drug: apixaban TAKE 1 TABLET BY MOUTH TWICE A DAY   fluocinonide-emollient 0.05 % cream Commonly known as: LIDEX-E APPLY SPARINGLY TO AFFECTED AREA TWICE A DAY AS DIRECTED   memantine 5 MG tablet Commonly known as: NAMENDA Take 1 PO daily and assess tolerabilty.       Allergies:  Allergies  Allergen Reactions  . Shellfish Allergy Itching and Swelling    Shrimp     Past Medical History, Surgical history, Social history, and Family History were reviewed and updated.  Review of Systems: Review of Systems  Constitutional: Negative.   HENT: Positive for hearing loss.   Eyes: Positive for blurred vision.  Respiratory: Negative.   Cardiovascular: Positive for palpitations.  Gastrointestinal: Negative.   Genitourinary: Negative.   Musculoskeletal: Negative.   Skin: Negative.   Neurological: Positive for dizziness.  Endo/Heme/Allergies: Negative.   Psychiatric/Behavioral:  Negative.      Physical Exam:  weight is 116 lb 1.9 oz (52.7 kg). Her oral temperature is 97.7 F (36.5 C). Her blood pressure is 106/64 and her pulse is 95. Her respiration is 20 and oxygen saturation is 99%.   Wt Readings from Last 3 Encounters:  04/15/20 116 lb 1.9 oz (52.7 kg)  03/22/20 119 lb (54 kg)  02/04/20 117 lb (53.1 kg)    Physical Exam Vitals reviewed.  HENT:     Head: Normocephalic and atraumatic.  Eyes:     Pupils: Pupils are equal, round, and reactive to light.  Cardiovascular:     Rate and Rhythm: Normal rate and regular rhythm.     Heart sounds: Normal heart sounds.     Comments: Cardiac exam shows irregular rate and irregular rhythm  consistent with atrial fibrillation.  The rate is fairly well controlled.  She has no murmurs, rubs or bruits. Pulmonary:     Effort: Pulmonary effort is normal.     Breath sounds: Normal breath sounds.  Abdominal:     General: Bowel sounds are normal.     Palpations: Abdomen is soft.  Musculoskeletal:        General: No tenderness or deformity. Normal range of motion.     Cervical back: Normal range of motion.     Comments: Her extremities does show some nonpitting edema in the lower legs.  This is mild.  She has arthritic changes in her joints.  Lymphadenopathy:     Cervical: No cervical adenopathy.  Skin:    General: Skin is warm and dry.     Findings: No erythema or rash.  Neurological:     Mental Status: She is alert and oriented to person, place, and time.  Psychiatric:        Behavior: Behavior normal.        Thought Content: Thought content normal.        Judgment: Judgment normal.     Lab Results  Component Value Date   WBC 6.1 04/15/2020   HGB 13.8 04/15/2020   HCT 42.0 04/15/2020   MCV 91.7 04/15/2020   PLT 218 04/15/2020   Lab Results  Component Value Date   FERRITIN 68 02/14/2018   IRON 170 (H) 02/14/2018   TIBC 308 02/14/2018   UIBC 138 02/14/2018   IRONPCTSAT 55 02/14/2018   Lab Results  Component Value Date   RBC 4.58 04/15/2020   Lab Results  Component Value Date   KPAFRELGTCHN 11.9 05/19/2019   LAMBDASER 9.1 05/19/2019   KAPLAMBRATIO 1.31 05/19/2019   Lab Results  Component Value Date   IGGSERUM 573 (L) 05/19/2019   IGA 66 05/19/2019   IGMSERUM 37 05/19/2019   No results found for: Odetta Pink, SPEI   Chemistry      Component Value Date/Time   NA 142 02/04/2020 1305   NA 144 10/17/2016 1000   K 4.4 02/04/2020 1305   K 4.5 10/17/2016 1000   CL 105 02/04/2020 1305   CL 106 10/17/2016 1000   CO2 29 02/04/2020 1305   CO2 30 10/17/2016 1000   BUN 24 (H) 02/04/2020 1305   BUN 18  10/17/2016 1000   CREATININE 1.13 (H) 02/04/2020 1305   CREATININE 1.6 (H) 10/17/2016 1000      Component Value Date/Time   CALCIUM 9.7 02/04/2020 1305   CALCIUM 9.4 10/17/2016 1000   ALKPHOS 136 (H) 02/04/2020 1305   ALKPHOS 101 (H) 10/17/2016  1000   AST 22 02/04/2020 1305   ALT 8 02/04/2020 1305   ALT 21 10/17/2016 1000   BILITOT 1.0 02/04/2020 1305       Impression and Plan: Kathy Massey 87 yo white female with recurrent small B-cell follicular NHL. She was originally treated with systemic chemotherapy with Rituxan/Treanda followed by maintenance Rituxan completed in February 2018.  She was treated with acalabrutinib for recurrent disease.  She did well with this for a while and then began to have some toxicity so we stopped it.  By her blood counts and blood smear, I do not see any problems with recurrence.  Again, quality of life is clearly our goal here.  Hopefully, the Florinef will help.  We will plan to get her back to see [place] in about 6 weeks or so.  I would like to see her back probably right after the Easter holiday.       Kathy Napoleon, MD 3/3/20223:13 PM

## 2020-04-15 NOTE — Telephone Encounter (Signed)
appts made and printed for pt per 04/15/20 los   [employer]

## 2020-04-30 ENCOUNTER — Ambulatory Visit: Payer: Medicare PPO | Admitting: Podiatry

## 2020-04-30 ENCOUNTER — Encounter: Payer: Self-pay | Admitting: Podiatry

## 2020-04-30 ENCOUNTER — Other Ambulatory Visit: Payer: Self-pay

## 2020-04-30 DIAGNOSIS — B351 Tinea unguium: Secondary | ICD-10-CM

## 2020-04-30 DIAGNOSIS — M79675 Pain in left toe(s): Secondary | ICD-10-CM

## 2020-04-30 DIAGNOSIS — M79674 Pain in right toe(s): Secondary | ICD-10-CM

## 2020-05-02 NOTE — Progress Notes (Signed)
Subjective:   Patient ID: Kathy Massey, female   DOB: 87 y.o.   MRN: [id number]   HPI Patient presents with elongated nailbeds 1-5 both feet that are thick dystrophic and she cannot cut and they are painful.  Presents with caregiver   ROS      Objective:  Physical Exam  Neurovascular status intact with patient found to have thick yellow brittle nailbeds 1-5 both feet painful     Assessment:  Mycotic nail infection with pain 1-5 both feet     Plan:  Debridement nailbeds 1-5 both feet no iatrogenic bleeding reappoint routine care

## 2020-06-02 ENCOUNTER — Inpatient Hospital Stay: Payer: Medicare PPO | Attending: Hematology & Oncology

## 2020-06-02 ENCOUNTER — Other Ambulatory Visit: Payer: Self-pay

## 2020-06-02 ENCOUNTER — Telehealth: Payer: Self-pay

## 2020-06-02 ENCOUNTER — Encounter: Payer: Self-pay | Admitting: Hematology & Oncology

## 2020-06-02 ENCOUNTER — Inpatient Hospital Stay (HOSPITAL_BASED_OUTPATIENT_CLINIC_OR_DEPARTMENT_OTHER): Payer: Medicare PPO | Admitting: Hematology & Oncology

## 2020-06-02 VITALS — BP 125/92 | HR 78 | Temp 97.8°F | Resp 16 | Wt 117.0 lb

## 2020-06-02 DIAGNOSIS — I4891 Unspecified atrial fibrillation: Secondary | ICD-10-CM | POA: Insufficient documentation

## 2020-06-02 DIAGNOSIS — R42 Dizziness and giddiness: Secondary | ICD-10-CM | POA: Insufficient documentation

## 2020-06-02 DIAGNOSIS — Z79899 Other long term (current) drug therapy: Secondary | ICD-10-CM | POA: Diagnosis not present

## 2020-06-02 DIAGNOSIS — C8583 Other specified types of non-Hodgkin lymphoma, intra-abdominal lymph nodes: Secondary | ICD-10-CM

## 2020-06-02 DIAGNOSIS — M7989 Other specified soft tissue disorders: Secondary | ICD-10-CM | POA: Insufficient documentation

## 2020-06-02 DIAGNOSIS — C8298 Follicular lymphoma, unspecified, lymph nodes of multiple sites: Secondary | ICD-10-CM | POA: Insufficient documentation

## 2020-06-02 LAB — CMP (CANCER CENTER ONLY)
ALT: 10 U/L (ref 0–44)
AST: 29 U/L (ref 15–41)
Albumin: 4.1 g/dL (ref 3.5–5.0)
Alkaline Phosphatase: 133 U/L — ABNORMAL HIGH (ref 38–126)
Anion gap: 9 (ref 5–15)
BUN: 21 mg/dL (ref 8–23)
CO2: 28 mmol/L (ref 22–32)
Calcium: 9.6 mg/dL (ref 8.9–10.3)
Chloride: 103 mmol/L (ref 98–111)
Creatinine: 1.16 mg/dL — ABNORMAL HIGH (ref 0.44–1.00)
GFR, Estimated: 45 mL/min — ABNORMAL LOW (ref >60)
Glucose, Bld: 117 mg/dL — ABNORMAL HIGH (ref 70–99)
Potassium: 4 mmol/L (ref 3.5–5.1)
Sodium: 140 mmol/L (ref 135–145)
Total Bilirubin: 0.9 mg/dL (ref 0.3–1.2)
Total Protein: 6.5 g/dL (ref 6.5–8.1)

## 2020-06-02 LAB — CBC WITH DIFFERENTIAL (CANCER CENTER ONLY)
Abs Immature Granulocytes: 0.01 10*3/uL (ref 0.00–0.07)
Basophils Absolute: 0.1 10*3/uL (ref 0.0–0.1)
Basophils Relative: 1 %
Eosinophils Absolute: 0.1 10*3/uL (ref 0.0–0.5)
Eosinophils Relative: 2 %
HCT: 41.8 % (ref 36.0–46.0)
Hemoglobin: 13.4 g/dL (ref 12.0–15.0)
Immature Granulocytes: 0 %
Lymphocytes Relative: 17 %
Lymphs Abs: 0.8 10*3/uL (ref 0.7–4.0)
MCH: 30 pg (ref 26.0–34.0)
MCHC: 32.1 g/dL (ref 30.0–36.0)
MCV: 93.7 fL (ref 80.0–100.0)
Monocytes Absolute: 0.4 10*3/uL (ref 0.1–1.0)
Monocytes Relative: 7 %
Neutro Abs: 3.6 10*3/uL (ref 1.7–7.7)
Neutrophils Relative %: 73 %
Platelet Count: 194 10*3/uL (ref 150–400)
RBC: 4.46 MIL/uL (ref 3.87–5.11)
RDW: 13.8 % (ref 11.5–15.5)
WBC Count: 5 10*3/uL (ref 4.0–10.5)
nRBC: 0 % (ref 0.0–0.2)

## 2020-06-02 LAB — LACTATE DEHYDROGENASE: LDH: 203 U/L — ABNORMAL HIGH (ref 98–192)

## 2020-06-02 LAB — SAVE SMEAR(SSMR), FOR PROVIDER SLIDE REVIEW

## 2020-06-02 NOTE — Telephone Encounter (Signed)
appts made per 06-02-20 los and pt will gain sch in office and through [employer]

## 2020-06-02 NOTE — Progress Notes (Signed)
Pt. Is accompanied by daughter who helped with the assignment.

## 2020-06-02 NOTE — Progress Notes (Signed)
Hematology and Oncology Follow Up Visit  Kathy Massey [id number] November 27, 1927 87 y.o. 06/02/2020   Principle Diagnosis:  Recurrent small B-cell follicular NHL  Past Therapy:        Acalabrutinib 100 mg po q day - started on 02/25/2018 -- d/c on 10/22/2019  Current Therapy: Observation   Interim History:  Kathy Massey is here today with Kathy Massey daughter for follow-up.  Unfortunately, the Florinef that we tried Kathy Massey on did not really help with the dizziness.  Kathy Massey daughter might have helped.  Although it was because some swelling in the legs.  We will go ahead and stop the Florinef.  Kathy Massey will be going out to [facility], [place] in a couple weeks.  There will be a wedding out there.  She will be out there for 2 weeks.  I am so happy for Kathy Massey.  I know she will have a wonderful time.  She still is doing independent living.  Kathy Massey daughter is doing a fantastic job trying to help Kathy Massey.  Kathy Massey daughter is helping with the medications.  I just wish that the dizziness would not be as bad.  I am not sure what the cause of the dizziness might be.  I know she has atrial fibrillation.  Kathy Massey blood pressure not that bad at all.  She has had no problems with bowels or bladder.  She has not fallen.  She has had no rashes.  She has had no cough or shortness of breath.  Kathy Massey appetite is okay.  She did have a nice Easter with Kathy Massey family.  Overall, I would have to say performance status is probably ECOG 3.    Medications:  Allergies as of 06/02/2020      Reactions   Shellfish Allergy Itching, Swelling   Shrimp      Medication List       Accurate as of June 02, 2020  3:42 PM. If you have any questions, ask your nurse or doctor.        atorvastatin 20 MG tablet Commonly known as: LIPITOR Take 20 mg by mouth daily.   clindamycin 1 % lotion Commonly known as: Cleocin-T Apply topically 2 (two) times daily.   clobetasol ointment 0.05 % Commonly known as: TEMOVATE Apply topically.   diltiazem  60 MG 12 hr capsule Commonly known as: CARDIZEM SR Take 1 tablet (60 mg total) once daily.  You may take a second tablet daily if needed for blood pressure and/or heart rate.   doxycycline 50 MG capsule Commonly known as: VIBRAMYCIN Take 50 mg by mouth daily.   Eliquis 2.5 MG Tabs tablet Generic drug: apixaban TAKE 1 TABLET BY MOUTH TWICE A DAY   fludrocortisone 0.1 MG tablet Commonly known as: FLORINEF Take 1 tablet (0.1 mg total) by mouth daily.   fluocinonide-emollient 0.05 % cream Commonly known as: LIDEX-E APPLY SPARINGLY TO AFFECTED AREA TWICE A DAY AS DIRECTED   memantine 5 MG tablet Commonly known as: NAMENDA Take 1 PO daily and assess tolerabilty.       Allergies:  Allergies  Allergen Reactions  . Shellfish Allergy Itching and Swelling    Shrimp     Past Medical History, Surgical history, Social history, and Family History were reviewed and updated.  Review of Systems: Review of Systems  Constitutional: Negative.   HENT: Positive for hearing loss.   Eyes: Positive for blurred vision.  Respiratory: Negative.   Cardiovascular: Positive for palpitations.  Gastrointestinal: Negative.   Genitourinary: Negative.  Musculoskeletal: Negative.   Skin: Negative.   Neurological: Positive for dizziness.  Endo/Heme/Allergies: Negative.   Psychiatric/Behavioral: Negative.      Physical Exam:  weight is 117 lb (53.1 kg). Kathy Massey oral temperature is 97.8 F (36.6 C). Kathy Massey blood pressure is 125/92 (abnormal) and Kathy Massey pulse is 78. Kathy Massey respiration is 16 and oxygen saturation is 99%.   Wt Readings from Last 3 Encounters:  06/02/20 117 lb (53.1 kg)  04/15/20 116 lb 1.9 oz (52.7 kg)  03/22/20 119 lb (54 kg)    Physical Exam Vitals reviewed.  HENT:     Head: Normocephalic and atraumatic.  Eyes:     Pupils: Pupils are equal, round, and reactive to light.  Cardiovascular:     Rate and Rhythm: Normal rate and regular rhythm.     Heart sounds: Normal heart sounds.      Comments: Cardiac exam shows irregular rate and irregular rhythm consistent with atrial fibrillation.  The rate is fairly well controlled.  She has no murmurs, rubs or bruits. Pulmonary:     Effort: Pulmonary effort is normal.     Breath sounds: Normal breath sounds.  Abdominal:     General: Bowel sounds are normal.     Palpations: Abdomen is soft.  Musculoskeletal:        General: No tenderness or deformity. Normal range of motion.     Cervical back: Normal range of motion.     Comments: Kathy Massey extremities does show some nonpitting edema in the lower legs.  This is mild.  She has arthritic changes in Kathy Massey joints.  Lymphadenopathy:     Cervical: No cervical adenopathy.  Skin:    General: Skin is warm and dry.     Findings: No erythema or rash.  Neurological:     Mental Status: She is alert and oriented to person, place, and time.  Psychiatric:        Behavior: Behavior normal.        Thought Content: Thought content normal.        Judgment: Judgment normal.     Lab Results  Component Value Date   WBC 5.0 06/02/2020   HGB 13.4 06/02/2020   HCT 41.8 06/02/2020   MCV 93.7 06/02/2020   PLT 194 06/02/2020   Lab Results  Component Value Date   FERRITIN 68 02/14/2018   IRON 170 (H) 02/14/2018   TIBC 308 02/14/2018   UIBC 138 02/14/2018   IRONPCTSAT 55 02/14/2018   Lab Results  Component Value Date   RBC 4.46 06/02/2020   Lab Results  Component Value Date   KPAFRELGTCHN 11.9 05/19/2019   LAMBDASER 9.1 05/19/2019   KAPLAMBRATIO 1.31 05/19/2019   Lab Results  Component Value Date   IGGSERUM 573 (L) 05/19/2019   IGA 66 05/19/2019   IGMSERUM 37 05/19/2019   No results found for: Odetta Pink, SPEI   Chemistry      Component Value Date/Time   NA 140 06/02/2020 1444   NA 144 10/17/2016 1000   K 4.0 06/02/2020 1444   K 4.5 10/17/2016 1000   CL 103 06/02/2020 1444   CL 106 10/17/2016 1000   CO2 28 06/02/2020 1444    CO2 30 10/17/2016 1000   BUN 21 06/02/2020 1444   BUN 18 10/17/2016 1000   CREATININE 1.16 (H) 06/02/2020 1444   CREATININE 1.6 (H) 10/17/2016 1000      Component Value Date/Time   CALCIUM 9.6 06/02/2020 1444   CALCIUM  9.4 10/17/2016 1000   ALKPHOS 133 (H) 06/02/2020 1444   ALKPHOS 101 (H) 10/17/2016 1000   AST 29 06/02/2020 1444   ALT 10 06/02/2020 1444   ALT 21 10/17/2016 1000   BILITOT 0.9 06/02/2020 1444       Impression and Plan: KathyPostonis 87 yo white female with recurrent small B-cell follicular NHL. She was originally treated with systemic chemotherapy with Rituxan/Treanda followed by maintenance Rituxan completed in February 2018.  She was treated with acalabrutinib for recurrent disease.  She did well with this for a while and then began to have some toxicity so we stopped it.  By Kathy Massey blood counts and blood smear, I do not see any problems with recurrence.  I really hope that she has a wonderful time out in [place].  I know that she is looking forward to this.  Maybe, the dizziness will correct itself over time.  I would like to get Kathy Massey back in early June.  If we can get Kathy Massey back after [facility] Day.  If all looks good at that time, we will then plan to get Kathy Massey through the summer.      [name], MD 4/20/20223:42 PM

## 2020-07-15 ENCOUNTER — Inpatient Hospital Stay: Payer: Medicare PPO | Admitting: Hematology & Oncology

## 2020-07-15 ENCOUNTER — Inpatient Hospital Stay: Payer: Medicare PPO | Attending: Hematology & Oncology

## 2020-08-30 ENCOUNTER — Ambulatory Visit: Payer: Medicare PPO | Admitting: Podiatry

## 2020-08-30 ENCOUNTER — Encounter: Payer: Self-pay | Admitting: Podiatry

## 2020-08-30 ENCOUNTER — Other Ambulatory Visit: Payer: Self-pay

## 2020-08-30 DIAGNOSIS — B351 Tinea unguium: Secondary | ICD-10-CM

## 2020-08-30 DIAGNOSIS — M79675 Pain in left toe(s): Secondary | ICD-10-CM

## 2020-08-30 DIAGNOSIS — M79674 Pain in right toe(s): Secondary | ICD-10-CM | POA: Diagnosis not present

## 2020-09-01 IMAGING — CT NUCLEAR MEDICINE PET IMAGE RESTAGING (PS) SKULL BASE TO THIGH
1 of 8 series · 3 of 16 positions shown, 4 images · non-contrast
Comparison: 02/12/2018

CLINICAL DATA: Subsequent treatment strategy for lymphoma.

EXAM:
NUCLEAR MEDICINE PET SKULL BASE TO THIGH
TECHNIQUE: 5.96 mCi F-18 FDG was injected intravenously. Full-ring PET imaging
was performed from the skull base to thigh after the radiotracer. CT
data was obtained and used for attenuation correction and anatomic
localization.
Fasting blood glucose: 80 mg/dl

[Series 4: ct sk_thigh 5.0 b31f · axial · 0.98mm/px · z∈[-1364,-492]mm · 3 of 219 slices shown, 4 images]
[im 1/219  soft-tissue]
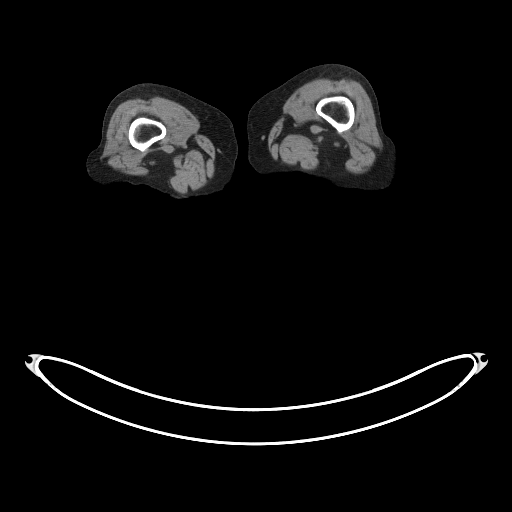
[im 1/219  bone]
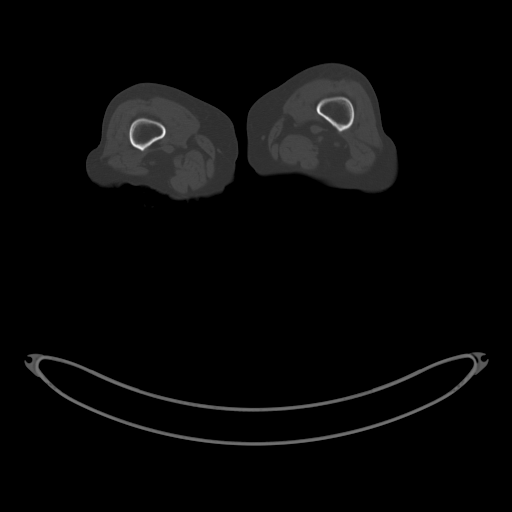
[im 110/219  soft-tissue]
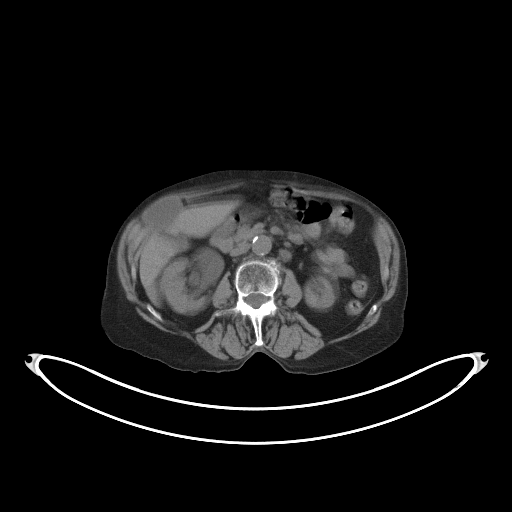
[im 219/219  soft-tissue]
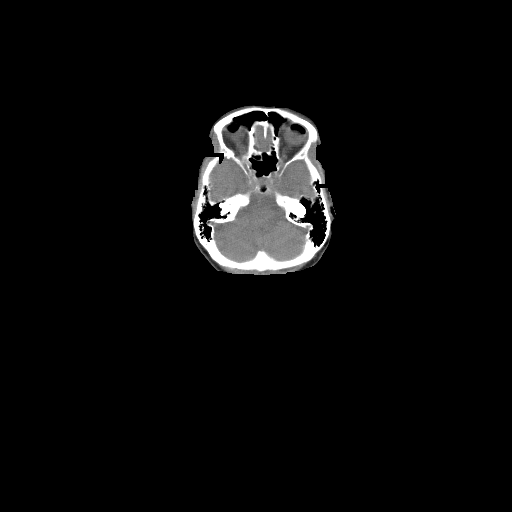

[3 of 16 positions shown; findings below may reference images not displayed]

FINDINGS: Mediastinal blood pool activity: SUV max

Liver activity: SUV max

NECK: No hypermetabolic lymph nodes in the neck.

Incidental CT findings: none

CHEST: Index left axillary lymph node measures 0.5 cm and has an SUV
max of 4.55, [HOSPITAL] criteria 4. On the previous exam this
measured 0.7 cm within SUV max of 2.98. No hypermetabolic
mediastinal or hilar lymph nodes. No suspicious pulmonary nodules.

Incidental CT findings: Cardiac enlargement and aortic
atherosclerosis.

ABDOMEN/PELVIS: No abnormal uptake within the liver, pancreas,
spleen or adrenal glands. No hypermetabolic lymph nodes within the
abdomen.

-Left external iliac node measures 1 cm and has an SUV max of 4.42,
[HOSPITAL] criteria 4. Unchanged.

-Right inguinal lymph node measures 1 cm within SUV max of 5.52,
[HOSPITAL] criteria 4. Previously this measured 1.3 cm with SUV max
of 7.8.

-Right inguinal lymph node measures 0.9 cm and has an SUV max of
3.82, [HOSPITAL] criteria 4. Previously this measured 1 cm within SUV
max of 5.05.

-Small left inguinal lymph node measures 0.8 cm within SUV max of
3.99, [HOSPITAL] criteria 4. Previously this measured 0.9 cm within
SUV max of 5.94.

Incidental CT findings: Interval decrease in volume of postsurgical
fluid collection at the left inguinal lymph node resection site.
This measures 1.9 cm, image 151/4. Previously 5.1 cm.

SKELETON: No focal hypermetabolic activity to suggest skeletal
metastasis.

Incidental CT findings: none
IMPRESSION: 1. Persistent FDG avid lymph nodes within the left axilla, left
external iliac nodal chain, and bilateral inguinal regions
compatible with [HOSPITAL] criteria 4 lesions.

## 2020-09-02 NOTE — Progress Notes (Signed)
Subjective: Kathy Massey is a pleasant 87 y.o. female patient seen today painful thick toenails that are difficult to trim. Pain interferes with ambulation. Aggravating factors include wearing enclosed shoe gear. Pain is relieved with periodic professional debridement.  She voices no new pedal problems on today's visit.  Allergies  Allergen Reactions   Shellfish Allergy Itching and Swelling    Shrimp     Objective: Physical Exam  General: Kathy Massey is a pleasant 87 y.o. Caucasian female, in NAD. AAO x 3.   Vascular:  Capillary fill time to digits <3 seconds b/l lower extremities. Palpable pedal pulses b/l LE. Pedal hair sparse. Lower extremity skin temperature gradient within normal limits. No pain with calf compression b/l.  Dermatological:  Pedal skin with normal turgor, texture and tone b/l lower extremities. No open wounds b/l lower extremities. Toenails 1-5 b/l elongated, discolored, dystrophic, thickened, crumbly with subungual debris and tenderness to dorsal palpation.  Musculoskeletal:  Normal muscle strength 5/5 to all lower extremity muscle groups bilaterally. No pain crepitus or joint limitation noted with ROM b/l. No gross bony deformities bilaterally.  Neurological:  Protective sensation intact 5/5 intact bilaterally with 10g monofilament b/l. Vibratory sensation intact b/l.  Assessment and Plan:  1. Pain due to onychomycosis of toenails of both feet   -Examined patient. -Patient to continue soft, supportive shoe gear daily. -Toenails 1-5 b/l were debrided in length and girth with sterile nail nippers and dremel without iatrogenic bleeding.  -Patient to report any pedal injuries to medical professional immediately. -Patient/POA to call should there be question/concern in the interim.  Return in about 3 months (around 11/30/2020).  Marzetta Board, DPM

## 2020-09-09 ENCOUNTER — Other Ambulatory Visit: Payer: Self-pay

## 2020-09-09 ENCOUNTER — Inpatient Hospital Stay: Payer: Medicare PPO | Admitting: Hematology & Oncology

## 2020-09-09 ENCOUNTER — Encounter: Payer: Self-pay | Admitting: Hematology & Oncology

## 2020-09-09 ENCOUNTER — Inpatient Hospital Stay: Payer: Medicare PPO | Attending: Hematology & Oncology

## 2020-09-09 VITALS — BP 101/66 | HR 100 | Temp 97.8°F | Resp 18 | Wt 116.0 lb

## 2020-09-09 DIAGNOSIS — C8298 Follicular lymphoma, unspecified, lymph nodes of multiple sites: Secondary | ICD-10-CM | POA: Insufficient documentation

## 2020-09-09 DIAGNOSIS — C8583 Other specified types of non-Hodgkin lymphoma, intra-abdominal lymph nodes: Secondary | ICD-10-CM | POA: Diagnosis not present

## 2020-09-09 DIAGNOSIS — I4891 Unspecified atrial fibrillation: Secondary | ICD-10-CM | POA: Insufficient documentation

## 2020-09-09 LAB — CMP (CANCER CENTER ONLY)
ALT: 8 U/L (ref 0–44)
AST: 27 U/L (ref 15–41)
Albumin: 4.1 g/dL (ref 3.5–5.0)
Alkaline Phosphatase: 121 U/L (ref 38–126)
Anion gap: 7 (ref 5–15)
BUN: 25 mg/dL — ABNORMAL HIGH (ref 8–23)
CO2: 27 mmol/L (ref 22–32)
Calcium: 9 mg/dL (ref 8.9–10.3)
Chloride: 101 mmol/L (ref 98–111)
Creatinine: 1.3 mg/dL — ABNORMAL HIGH (ref 0.44–1.00)
GFR, Estimated: 39 mL/min — ABNORMAL LOW (ref >60)
Glucose, Bld: 109 mg/dL — ABNORMAL HIGH (ref 70–99)
Potassium: 4.1 mmol/L (ref 3.5–5.1)
Sodium: 135 mmol/L (ref 135–145)
Total Bilirubin: 0.6 mg/dL (ref 0.3–1.2)
Total Protein: 6.7 g/dL (ref 6.5–8.1)

## 2020-09-09 LAB — CBC WITH DIFFERENTIAL (CANCER CENTER ONLY)
Abs Immature Granulocytes: 0.02 10*3/uL (ref 0.00–0.07)
Basophils Absolute: 0.1 10*3/uL (ref 0.0–0.1)
Basophils Relative: 1 %
Eosinophils Absolute: 0.1 10*3/uL (ref 0.0–0.5)
Eosinophils Relative: 2 %
HCT: 44.6 % (ref 36.0–46.0)
Hemoglobin: 14.1 g/dL (ref 12.0–15.0)
Immature Granulocytes: 0 %
Lymphocytes Relative: 9 %
Lymphs Abs: 0.6 10*3/uL — ABNORMAL LOW (ref 0.7–4.0)
MCH: 29.5 pg (ref 26.0–34.0)
MCHC: 31.6 g/dL (ref 30.0–36.0)
MCV: 93.3 fL (ref 80.0–100.0)
Monocytes Absolute: 0.4 10*3/uL (ref 0.1–1.0)
Monocytes Relative: 7 %
Neutro Abs: 5.1 10*3/uL (ref 1.7–7.7)
Neutrophils Relative %: 81 %
Platelet Count: 240 10*3/uL (ref 150–400)
RBC: 4.78 MIL/uL (ref 3.87–5.11)
RDW: 14.7 % (ref 11.5–15.5)
WBC Count: 6.3 10*3/uL (ref 4.0–10.5)
nRBC: 0 % (ref 0.0–0.2)

## 2020-09-09 LAB — LACTATE DEHYDROGENASE: LDH: 195 U/L — ABNORMAL HIGH (ref 98–192)

## 2020-09-09 NOTE — Progress Notes (Signed)
Hematology and Oncology Follow Up Visit  Kathy Massey [id number] May 14, 1927 87 y.o. 09/09/2020   Principle Diagnosis:  Recurrent small B-cell follicular NHL   Past Therapy:        Acalabrutinib 100 mg po q day - started on 02/25/2018 -- d/c on 10/22/2019  Current Therapy: Observation   Interim History:  Kathy Massey is here today with her daughter for follow-up.  She is managing okay.  She is in assisted living.  She seems to be doing fairly well there.  She does have her memory issues..  There is still issues with dizziness.  I just wonder this may not be from the atrial fibrillation.  The rate seems to be well controlled.  I should mention that she did have a nice time out in [place], [place].  They are out there I think in April.  They are out there for a wedding.  She seemed to have a good time.  She is eating okay.  She is having no problems with nausea or vomiting.  She does have skin lesions.  She does see the dermatologist.  She recently had some Mohs surgery.    There does not appear to be any issues with bowels or bladder.  I would have to say that overall, her performance status is ECOG 3.   Medications:  Allergies as of 09/09/2020       Reactions   Shellfish Allergy Itching, Swelling   Shrimp        Medication List        Accurate as of September 09, 2020  3:09 PM. If you have any questions, ask your nurse or doctor.          STOP taking these medications    doxycycline 50 MG capsule Commonly known as: VIBRAMYCIN Stopped by: Kathy Napoleon, MD       TAKE these medications    atorvastatin 20 MG tablet Commonly known as: LIPITOR Take 20 mg by mouth daily.   clindamycin 1 % lotion Commonly known as: Cleocin-T Apply topically 2 (two) times daily.   clobetasol ointment 0.05 % Commonly known as: TEMOVATE Apply topically.   diltiazem 60 MG 12 hr capsule Commonly known as: CARDIZEM SR Take 1 tablet (60 mg total) once daily.  You may take  a second tablet daily if needed for blood pressure and/or heart rate.   Eliquis 2.5 MG Tabs tablet Generic drug: apixaban TAKE 1 TABLET BY MOUTH TWICE A DAY   escitalopram 5 MG tablet Commonly known as: LEXAPRO Take 0.5 tablets by mouth daily.   fluocinonide-emollient 0.05 % cream Commonly known as: LIDEX-E APPLY SPARINGLY TO AFFECTED AREA TWICE A DAY AS DIRECTED        Allergies:  Allergies  Allergen Reactions   Shellfish Allergy Itching and Swelling    Shrimp     Past Medical History, Surgical history, Social history, and Family History were reviewed and updated.  Review of Systems: Review of Systems  Constitutional: Negative.   HENT:  Positive for hearing loss.   Eyes:  Positive for blurred vision.  Respiratory: Negative.    Cardiovascular:  Positive for palpitations.  Gastrointestinal: Negative.   Genitourinary: Negative.   Musculoskeletal: Negative.   Skin: Negative.   Neurological:  Positive for dizziness.  Endo/Heme/Allergies: Negative.   Psychiatric/Behavioral: Negative.      Physical Exam:  weight is 116 lb (52.6 kg). Her oral temperature is 97.8 F (36.6 C). Her blood pressure is 101/66 and her pulse  is 100. Her respiration is 18 and oxygen saturation is 95%.   Wt Readings from Last 3 Encounters:  09/09/20 116 lb (52.6 kg)  06/02/20 117 lb (53.1 kg)  04/15/20 116 lb 1.9 oz (52.7 kg)    Physical Exam Vitals reviewed.  HENT:     Head: Normocephalic and atraumatic.  Eyes:     Pupils: Pupils are equal, round, and reactive to light.  Cardiovascular:     Rate and Rhythm: Normal rate and regular rhythm.     Heart sounds: Normal heart sounds.     Comments: Cardiac exam shows irregular rate and irregular rhythm consistent with atrial fibrillation.  The rate is fairly well controlled.  She has no murmurs, rubs or bruits. Pulmonary:     Effort: Pulmonary effort is normal.     Breath sounds: Normal breath sounds.  Abdominal:     General: Bowel sounds  are normal.     Palpations: Abdomen is soft.  Musculoskeletal:        General: No tenderness or deformity. Normal range of motion.     Cervical back: Normal range of motion.     Comments: Her extremities does show some nonpitting edema in the lower legs.  This is mild.  She has arthritic changes in her joints.  Lymphadenopathy:     Cervical: No cervical adenopathy.  Skin:    General: Skin is warm and dry.     Findings: No erythema or rash.  Neurological:     Mental Status: She is alert and oriented to person, place, and time.  Psychiatric:        Behavior: Behavior normal.        Thought Content: Thought content normal.        Judgment: Judgment normal.    Lab Results  Component Value Date   WBC 6.3 09/09/2020   HGB 14.1 09/09/2020   HCT 44.6 09/09/2020   MCV 93.3 09/09/2020   PLT 240 09/09/2020   Lab Results  Component Value Date   FERRITIN 68 02/14/2018   IRON 170 (H) 02/14/2018   TIBC 308 02/14/2018   UIBC 138 02/14/2018   IRONPCTSAT 55 02/14/2018   Lab Results  Component Value Date   RBC 4.78 09/09/2020   Lab Results  Component Value Date   KPAFRELGTCHN 11.9 05/19/2019   LAMBDASER 9.1 05/19/2019   KAPLAMBRATIO 1.31 05/19/2019   Lab Results  Component Value Date   IGGSERUM 573 (L) 05/19/2019   IGA 66 05/19/2019   IGMSERUM 37 05/19/2019   No results found for: Odetta Pink, SPEI   Chemistry      Component Value Date/Time   NA 140 06/02/2020 1444   NA 144 10/17/2016 1000   K 4.0 06/02/2020 1444   K 4.5 10/17/2016 1000   CL 103 06/02/2020 1444   CL 106 10/17/2016 1000   CO2 28 06/02/2020 1444   CO2 30 10/17/2016 1000   BUN 21 06/02/2020 1444   BUN 18 10/17/2016 1000   CREATININE 1.16 (H) 06/02/2020 1444   CREATININE 1.6 (H) 10/17/2016 1000      Component Value Date/Time   CALCIUM 9.6 06/02/2020 1444   CALCIUM 9.4 10/17/2016 1000   ALKPHOS 133 (H) 06/02/2020 1444   ALKPHOS 101 (H) 10/17/2016  1000   AST 29 06/02/2020 1444   ALT 10 06/02/2020 1444   ALT 21 10/17/2016 1000   BILITOT 0.9 06/02/2020 1444       Impression and Plan: [name] is 87 yo white female with recurrent small B-cell follicular NHL. She was originally treated with systemic chemotherapy with Rituxan/Treanda followed by maintenance Rituxan completed in February 2018.  She was treated with acalabrutinib for recurrent disease.  She did well with this for a while and then began to have some toxicity so we stopped it.  He stopped it about a year ago.  By her blood counts and blood smear, I do not see any problems with recurrence.  Overall, I would have to say that her quality of life is about as good as it can be.  Again she has a memory issues.  She does have the atrial fibrillation.  I am glad that she is in the assisted living facility.  I know that this is a good place for her and that they are taking good care of her.  We will now plan to get her back after Thanksgiving.  I think this would be very reasonable.  Kathy Napoleon, MD 7/28/20223:09 PM

## 2020-10-11 ENCOUNTER — Ambulatory Visit: Payer: Medicare PPO | Admitting: Cardiology

## 2020-10-11 ENCOUNTER — Other Ambulatory Visit: Payer: Self-pay

## 2020-10-11 ENCOUNTER — Encounter: Payer: Self-pay | Admitting: Cardiology

## 2020-10-11 VITALS — BP 92/54 | HR 84 | Ht 60.0 in | Wt 116.0 lb

## 2020-10-11 DIAGNOSIS — I4819 Other persistent atrial fibrillation: Secondary | ICD-10-CM | POA: Diagnosis not present

## 2020-10-11 NOTE — Patient Instructions (Addendum)

## 2020-10-11 NOTE — Progress Notes (Signed)
Electrophysiology Office Note   Date:  10/11/2020   ID:  Kathy Massey, DOB [birth year], MRN [id number]  PCP:  [name], MD  Cardiologist:   Primary Electrophysiologist:  Dr Curt Bears    CC: Follow up for atrial fibrillation   History of Present Illness: Kathy Massey is a 87 y.o. female who is being seen today for the evaluation of atrial fibrillation at the request of [name].,*. Presenting today for electrophysiology evaluation.    She has a history significant for stress-induced cardiomyopathy and atrial fibrillation.  Her atrial fibrillation was originally diagnosed when she was persistent.  She had a lack of symptoms and opted for a rate control strategy.  She has in the past been given medications for rate control, but this made her feel weak and fatigued.  Today, denies symptoms of palpitations, chest pain, shortness of breath, orthopnea, PND, lower extremity edema, claudication, presyncope, syncope, bleeding, or neurologic sequela. The patient is tolerating medications without difficulties.  Since being seen she has done well.  Her dementia has continued to progress and she is moved into assisted living.  She continues to have the same dizziness that she has had over the last few years, this has been stable.  Aside from that she is without complaint.  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 EF > 55% by 06/16/15 echo.   ELBOW BURSA SURGERY Right 2016   EYE SURGERY     cataracts removed ,both eyes, /w IOL   LYMPH NODE BIOPSY Left  01/14/2018   Procedure: EXCISION DEEP LEFT INGUINAL LYMPH NODE BIOPSY ERAS PATHWAY;  Surgeon: Fanny Skates, MD;  Location: [facility];  Service: General;  Laterality: Left;   TONSILLECTOMY       Current Outpatient Medications  Medication Sig Dispense Refill   atorvastatin (LIPITOR) 20 MG tablet Take 20 mg by mouth daily.     clindamycin (CLEOCIN-T) 1 % lotion Apply topically 2 (two) times daily. 60 mL 3   clobetasol ointment (TEMOVATE) 0.05 % Apply topically.     diltiazem (CARDIZEM SR) 60 MG 12 hr capsule Take 1 tablet (60 mg total) once daily.  You may take a second tablet daily if needed for blood pressure and/or heart rate. 60 capsule 2   ELIQUIS 2.5 MG TABS tablet TAKE 1 TABLET BY MOUTH TWICE A DAY 60 tablet 8   escitalopram (LEXAPRO) 5 MG tablet Take 0.5 tablets by mouth daily.     fluocinonide-emollient (LIDEX-E) 0.05 % cream APPLY SPARINGLY TO AFFECTED AREA TWICE A DAY AS DIRECTED     No current facility-administered medications for this visit.    Allergies:   Shellfish allergy   Social History:  The patient  reports that she has never smoked. She has never used smokeless tobacco. She reports current alcohol use of about 1.0 standard drink per week. She reports that she does not use drugs.   Family History:  The patient's family history includes Alcohol abuse in her father and maternal grandfather; Breast cancer in her daughter  and paternal grandmother; Depression in her father and mother; Heart attack in her father; Heart disease in her father and mother; Heart failure in her mother; Multiple myeloma in her maternal grandmother.   ROS:  Please see the history of present illness.   Otherwise, review of systems is positive for none.   All other systems are reviewed and negative.   PHYSICAL EXAM: VS:  BP (!) 92/54   Pulse 84   Ht 5' (1.524 m)   Wt 116 lb (52.6 kg)   SpO2 96%   BMI 22.65 kg/m  , BMI Body mass index is 22.65 kg/m. GEN: Well nourished, well developed, in no acute  distress  HEENT: normal  Neck: no JVD, carotid bruits, or masses Cardiac: RRR; no murmurs, rubs, or gallops,no edema  Respiratory:  clear to auscultation bilaterally, normal work of breathing GI: soft, nontender, nondistended, + BS MS: no deformity or atrophy  Skin: warm and dry Neuro:  Strength and sensation are intact Psych: euthymic mood, full affect  EKG:  EKG is ordered today. Personal review of the ekg ordered shows atrial fibrillation, rate 84  Recent Labs: 09/09/2020: ALT 8; BUN 25; Creatinine 1.30; Hemoglobin 14.1; Platelet Count 240; Potassium 4.1; Sodium 135    Lipid Panel  No results found for: CHOL, TRIG, HDL, CHOLHDL, VLDL, LDLCALC, LDLDIRECT   Wt Readings from Last 3 Encounters:  10/11/20 116 lb (52.6 kg)  09/09/20 116 lb (52.6 kg)  06/02/20 117 lb (53.1 kg)      Other studies Reviewed: Additional studies/ records that were reviewed today include: TTE 06/16/15  Review of the above records today demonstrates:   Normal left ventricular systolic function, ejection fraction > 53%  Diastolic dysfunction - grade I (normal filling pressures)  Dilated left atrium - mild  Degenerative mitral valve disease  Tricuspid regurgitation - mild  Pulmonic regurgitation - mild  Normal right ventricular systolic function  Dilated right atrium - mild  Cardiac cath 06/15/15 1. No angiographically apparent coronary artery disease 2. Normal LV filling pressures  Cardiac monitor 10/03/2019 personally reviewed Max 173 bpm 10:06pm, 08/01 Min 54 bpm 08:50am, 07/30 Avg 96 bpm <1% PVCs 100% atrial fibrillation burden noted Triggered event associated with AF, rate 99-134   ASSESSMENT AND PLAN:  1.  Permanent atrial fibrillation: Currently on Eliquis and diltiazem.  CHA2DS2-VASc of 3.  Remains well rate controlled.  No current atrial fibrillation symptoms.  No changes.  2.  Stress-induced cardiomyopathy: Ejection fraction has since normalized.  No changes.  Current  medicines are reviewed at length with the patient today.   The patient does not have concerns regarding her medicines.  The following changes were made today: None  Labs/ tests ordered today include:  Orders Placed This Encounter  Procedures   EKG 12-Lead      Disposition:   FU with Daiwik [place] 12 months  Signed, Vasilisa Vore Meredith Leeds, MD  10/11/2020 2:23 PM     [place] [address] [phone] (office) [phone] (fax)

## 2020-10-26 ENCOUNTER — Other Ambulatory Visit: Payer: Self-pay | Admitting: Cardiology

## 2020-10-26 DIAGNOSIS — C8590 Non-Hodgkin lymphoma, unspecified, unspecified site: Secondary | ICD-10-CM

## 2020-10-26 DIAGNOSIS — C8583 Other specified types of non-Hodgkin lymphoma, intra-abdominal lymph nodes: Secondary | ICD-10-CM

## 2020-10-26 NOTE — Telephone Encounter (Signed)
Prescription refill request for Eliquis received. Indication: afib  Last office visit: [place], 10/11/2020 Scr: 1.30, 09/09/2020 Age: 87 yo  Weight: 52.6 kg   Refill sent.

## 2020-11-30 ENCOUNTER — Ambulatory Visit: Payer: Medicare PPO | Admitting: Podiatry

## 2020-12-14 ENCOUNTER — Other Ambulatory Visit: Payer: Self-pay

## 2020-12-14 ENCOUNTER — Ambulatory Visit: Payer: Medicare PPO | Admitting: Podiatry

## 2020-12-14 ENCOUNTER — Encounter: Payer: Self-pay | Admitting: Podiatry

## 2020-12-14 DIAGNOSIS — M2042 Other hammer toe(s) (acquired), left foot: Secondary | ICD-10-CM

## 2020-12-14 DIAGNOSIS — M79674 Pain in right toe(s): Secondary | ICD-10-CM

## 2020-12-14 DIAGNOSIS — M79675 Pain in left toe(s): Secondary | ICD-10-CM

## 2020-12-14 DIAGNOSIS — M21619 Bunion of unspecified foot: Secondary | ICD-10-CM

## 2020-12-14 DIAGNOSIS — B351 Tinea unguium: Secondary | ICD-10-CM | POA: Diagnosis not present

## 2020-12-14 DIAGNOSIS — D689 Coagulation defect, unspecified: Secondary | ICD-10-CM

## 2020-12-14 NOTE — Progress Notes (Signed)
This patient returns to my office for at risk foot care.  This patient requires this care by a professional since this patient will be at risk due to having CKD and dementia.  This patient is unable to cut nails herself since the patient cannot reach her nails.These nails are painful walking and wearing shoes.  This patient presents for at risk foot care today.  General Appearance  Alert, conversant and in no acute stress.  Vascular  Dorsalis pedis and posterior tibial  pulses are weakly palpable  bilaterally.  Capillary return is within normal limits  bilaterally. Cold feet bilaterally.  Neurologic  Senn-Weinstein monofilament wire test within normal limits  bilaterally. Muscle power within normal limits bilaterally.  Nails Thick disfigured discolored nails with subungual debris  from hallux to fifth toes bilaterally. No evidence of bacterial infection or drainage bilaterally.  Orthopedic  No limitations of motion  feet .  No crepitus or effusions noted.  No bony pathology or digital deformities noted.  Skin  normotropic skin with no porokeratosis noted bilaterally.  No signs of infections or ulcers noted.     Onychomycosis  Pain in right toes  Pain in left toes  Consent was obtained for treatment procedures.   Mechanical debridement of nails 1-5  bilaterally performed with a nail nipper.  Filed with dremel without incident.    Return office visit    3 months                  Told patient to return for periodic foot care and evaluation due to potential at risk complications.   Gardiner Barefoot DPM

## 2021-01-12 ENCOUNTER — Other Ambulatory Visit: Payer: Medicare PPO

## 2021-01-12 ENCOUNTER — Ambulatory Visit: Payer: Medicare PPO | Admitting: Hematology & Oncology

## 2021-01-17 ENCOUNTER — Inpatient Hospital Stay: Payer: Medicare PPO | Attending: Hematology & Oncology

## 2021-01-17 ENCOUNTER — Encounter: Payer: Self-pay | Admitting: Hematology & Oncology

## 2021-01-17 ENCOUNTER — Other Ambulatory Visit: Payer: Self-pay

## 2021-01-17 ENCOUNTER — Inpatient Hospital Stay: Payer: Medicare PPO | Admitting: Hematology & Oncology

## 2021-01-17 VITALS — BP 120/65 | HR 100 | Temp 97.7°F | Resp 16 | Wt 120.0 lb

## 2021-01-17 DIAGNOSIS — Z79899 Other long term (current) drug therapy: Secondary | ICD-10-CM | POA: Insufficient documentation

## 2021-01-17 DIAGNOSIS — L570 Actinic keratosis: Secondary | ICD-10-CM | POA: Diagnosis not present

## 2021-01-17 DIAGNOSIS — S42002D Fracture of unspecified part of left clavicle, subsequent encounter for fracture with routine healing: Secondary | ICD-10-CM | POA: Insufficient documentation

## 2021-01-17 DIAGNOSIS — F0390 Unspecified dementia without behavioral disturbance: Secondary | ICD-10-CM | POA: Insufficient documentation

## 2021-01-17 DIAGNOSIS — Z8572 Personal history of non-Hodgkin lymphomas: Secondary | ICD-10-CM | POA: Insufficient documentation

## 2021-01-17 DIAGNOSIS — C8583 Other specified types of non-Hodgkin lymphoma, intra-abdominal lymph nodes: Secondary | ICD-10-CM

## 2021-01-17 DIAGNOSIS — I4891 Unspecified atrial fibrillation: Secondary | ICD-10-CM | POA: Insufficient documentation

## 2021-01-17 DIAGNOSIS — Z7901 Long term (current) use of anticoagulants: Secondary | ICD-10-CM | POA: Insufficient documentation

## 2021-01-17 DIAGNOSIS — Z9221 Personal history of antineoplastic chemotherapy: Secondary | ICD-10-CM | POA: Diagnosis not present

## 2021-01-17 LAB — CBC WITH DIFFERENTIAL (CANCER CENTER ONLY)
Abs Immature Granulocytes: 0.02 10*3/uL (ref 0.00–0.07)
Basophils Absolute: 0.1 10*3/uL (ref 0.0–0.1)
Basophils Relative: 1 %
Eosinophils Absolute: 0.1 10*3/uL (ref 0.0–0.5)
Eosinophils Relative: 3 %
HCT: 41.9 % (ref 36.0–46.0)
Hemoglobin: 13.3 g/dL (ref 12.0–15.0)
Immature Granulocytes: 0 %
Lymphocytes Relative: 18 %
Lymphs Abs: 0.9 10*3/uL (ref 0.7–4.0)
MCH: 30.8 pg (ref 26.0–34.0)
MCHC: 31.7 g/dL (ref 30.0–36.0)
MCV: 97 fL (ref 80.0–100.0)
Monocytes Absolute: 0.4 10*3/uL (ref 0.1–1.0)
Monocytes Relative: 8 %
Neutro Abs: 3.4 10*3/uL (ref 1.7–7.7)
Neutrophils Relative %: 70 %
Platelet Count: 199 10*3/uL (ref 150–400)
RBC: 4.32 MIL/uL (ref 3.87–5.11)
RDW: 16.3 % — ABNORMAL HIGH (ref 11.5–15.5)
WBC Count: 4.8 10*3/uL (ref 4.0–10.5)
nRBC: 0 % (ref 0.0–0.2)

## 2021-01-17 LAB — CMP (CANCER CENTER ONLY)
ALT: 7 U/L (ref 0–44)
AST: 24 U/L (ref 15–41)
Albumin: 4.4 g/dL (ref 3.5–5.0)
Alkaline Phosphatase: 138 U/L — ABNORMAL HIGH (ref 38–126)
Anion gap: 10 (ref 5–15)
BUN: 17 mg/dL (ref 8–23)
CO2: 28 mmol/L (ref 22–32)
Calcium: 9.6 mg/dL (ref 8.9–10.3)
Chloride: 104 mmol/L (ref 98–111)
Creatinine: 1.15 mg/dL — ABNORMAL HIGH (ref 0.44–1.00)
GFR, Estimated: 45 mL/min — ABNORMAL LOW (ref >60)
Glucose, Bld: 135 mg/dL — ABNORMAL HIGH (ref 70–99)
Potassium: 3.8 mmol/L (ref 3.5–5.1)
Sodium: 142 mmol/L (ref 135–145)
Total Bilirubin: 0.7 mg/dL (ref 0.3–1.2)
Total Protein: 6.5 g/dL (ref 6.5–8.1)

## 2021-01-17 LAB — LACTATE DEHYDROGENASE: LDH: 199 U/L — ABNORMAL HIGH (ref 98–192)

## 2021-01-17 NOTE — Progress Notes (Signed)
Hematology and Oncology Follow Up Visit  Kathy Massey [id number] [birth year] 87 y.o. 01/17/2021   Principle Diagnosis:  Recurrent small B-cell follicular NHL   Past Therapy:        Acalabrutinib 100 mg po q day - started on 02/25/2018 -- d/c on 10/22/2019  Current Therapy: Observation   Interim History:  Kathy Massey is here today with Kathy Massey for follow-up.  We last saw Kathy back in July.  She has broken Kathy left collarbone.  I think this was about 6 weeks ago.  She fell.  It sounds like she is doing better right now.  The dementia is certainly Kathy biggest issue.  She is quite pleasant.  She just does not remember much.  Kathy Massey, comes in with Kathy, does most of the answering of questions.  It sounds like she had a decent Thanksgiving.  She was at a son's house.  It seems like she ate okay  She has had no problems with bowels or bladder.  She has had no diarrhea.  She has had no bleeding.  She has a lot of actinic keratoses and solar keratoses.  She does see dermatology.  She does have the atrial fibrillation.  He is on low-dose Eliquis.    I would have to say that overall, Kathy performance status is ECOG 3.   Medications:  Allergies as of 01/17/2021       Reactions   Shellfish Allergy Itching, Swelling   Shrimp        Medication List        Accurate as of January 17, 2021  3:52 PM. If you have any questions, ask your nurse or doctor.          STOP taking these medications    ergocalciferol 1.25 MG (50000 UT) capsule Commonly known as: VITAMIN D2 Stopped by: [name], MD       TAKE these medications    atorvastatin 20 MG tablet Commonly known as: LIPITOR Take 20 mg by mouth daily.   clindamycin 1 % lotion Commonly known as: Cleocin-T Apply topically 2 (two) times daily.   clobetasol ointment 0.05 % Commonly known as: TEMOVATE Apply topically.   cyanocobalamin 1000 MCG tablet Take by mouth.   diltiazem 60 MG 12 hr  capsule Commonly known as: CARDIZEM SR Take 1 tablet (60 mg total) once daily.  You may take a second tablet daily if needed for blood pressure and/or heart rate.   Eliquis 2.5 MG Tabs tablet Generic drug: apixaban TAKE 1 TABLET BY MOUTH TWICE A DAY   escitalopram 5 MG tablet Commonly known as: LEXAPRO Take 0.5 tablets by mouth daily.   fluocinonide-emollient 0.05 % cream Commonly known as: LIDEX-E APPLY SPARINGLY TO AFFECTED AREA TWICE A DAY AS DIRECTED   memantine 10 MG tablet Commonly known as: NAMENDA Take 1 BID for memory.   TYLENOL 8 HOUR PO Take by mouth as needed.        Allergies:  Allergies  Allergen Reactions   Shellfish Allergy Itching and Swelling    Shrimp     Past Medical History, Surgical history, Social history, and Family History were reviewed and updated.  Review of Systems: Review of Systems  Constitutional: Negative.   HENT:  Positive for hearing loss.   Eyes:  Positive for blurred vision.  Respiratory: Negative.    Cardiovascular:  Positive for palpitations.  Gastrointestinal: Negative.   Genitourinary: Negative.   Musculoskeletal: Negative.   Skin: Negative.   Neurological:  Positive for dizziness.  Endo/Heme/Allergies: Negative.   Psychiatric/Behavioral: Negative.      Physical Exam:  weight is 120 lb (54.4 kg). Kathy oral temperature is 97.7 F (36.5 C). Kathy blood pressure is 120/65 and Kathy pulse is 100. Kathy respiration is 16 and oxygen saturation is 98%.   Wt Readings from Last 3 Encounters:  01/17/21 120 lb (54.4 kg)  10/11/20 116 lb (52.6 kg)  09/09/20 116 lb (52.6 kg)    Physical Exam Vitals reviewed.  HENT:     Head: Normocephalic and atraumatic.  Eyes:     Pupils: Pupils are equal, round, and reactive to light.  Cardiovascular:     Rate and Rhythm: Normal rate and regular rhythm.     Heart sounds: Normal heart sounds.     Comments: Cardiac exam shows irregular rate and irregular rhythm consistent with atrial  fibrillation.  The rate is fairly well controlled.  She has no murmurs, rubs or bruits. Pulmonary:     Effort: Pulmonary effort is normal.     Breath sounds: Normal breath sounds.  Abdominal:     General: Bowel sounds are normal.     Palpations: Abdomen is soft.  Musculoskeletal:        General: No tenderness or deformity. Normal range of motion.     Cervical back: Normal range of motion.     Comments: Kathy extremities does show some nonpitting edema in the lower legs.  This is mild.  She has arthritic changes in Kathy joints.  Lymphadenopathy:     Cervical: No cervical adenopathy.  Skin:    General: Skin is warm and dry.     Findings: No erythema or rash.  Neurological:     Mental Status: She is alert and oriented to person, place, and time.  Psychiatric:        Behavior: Behavior normal.        Thought Content: Thought content normal.        Judgment: Judgment normal.    Lab Results  Component Value Date   WBC 4.8 01/17/2021   HGB 13.3 01/17/2021   HCT 41.9 01/17/2021   MCV 97.0 01/17/2021   PLT 199 01/17/2021   Lab Results  Component Value Date   FERRITIN 68 02/14/2018   IRON 170 (H) 02/14/2018   TIBC 308 02/14/2018   UIBC 138 02/14/2018   IRONPCTSAT 55 02/14/2018   Lab Results  Component Value Date   RBC 4.32 01/17/2021   Lab Results  Component Value Date   KPAFRELGTCHN 11.9 05/19/2019   LAMBDASER 9.1 05/19/2019   KAPLAMBRATIO 1.31 05/19/2019   Lab Results  Component Value Date   IGGSERUM 573 (L) 05/19/2019   IGA 66 05/19/2019   IGMSERUM 37 05/19/2019   No results found for: Odetta Pink, SPEI   Chemistry      Component Value Date/Time   NA 142 01/17/2021 1509   NA 144 10/17/2016 1000   K 3.8 01/17/2021 1509   K 4.5 10/17/2016 1000   CL 104 01/17/2021 1509   CL 106 10/17/2016 1000   CO2 28 01/17/2021 1509   CO2 30 10/17/2016 1000   BUN 17 01/17/2021 1509   BUN 18 10/17/2016 1000   CREATININE  1.15 (H) 01/17/2021 1509   CREATININE 1.6 (H) 10/17/2016 1000      Component Value Date/Time   CALCIUM 9.6 01/17/2021 1509   CALCIUM 9.4 10/17/2016 1000   ALKPHOS 138 (H) 01/17/2021 1509   ALKPHOS  101 (H) 10/17/2016 1000   AST 24 01/17/2021 1509   ALT 7 01/17/2021 1509   ALT 21 10/17/2016 1000   BILITOT 0.7 01/17/2021 1509       Impression and Plan: Kathy Massey is 87 yo white female with recurrent small B-cell follicular NHL. She was originally treated with systemic chemotherapy with Rituxan/Treanda followed by maintenance Rituxan completed in February 2018.  She was treated with acalabrutinib for recurrent disease.  She did well with this for a while and then began to have some toxicity so we stopped it.  We stopped it about a year and a half ago.  By Kathy blood counts and blood smear, I do not see any problems with recurrence.  Overall, our goal is Kathy quality of life.  I hate that she fell and broke the collarbone.  We will now plan to get Kathy back after When Kathy.  I will plan to get Kathy back in the springtime when the weather should be decent.     [name], MD 12/5/20223:52 PM

## 2021-01-17 NOTE — Progress Notes (Signed)
Pt. accompanied by daughter who helped with the assessment.

## 2021-02-13 ENCOUNTER — Emergency Department (HOSPITAL_COMMUNITY): Payer: Medicare PPO

## 2021-02-13 ENCOUNTER — Inpatient Hospital Stay (HOSPITAL_COMMUNITY)
Admission: EM | Admit: 2021-02-13 | Discharge: 2021-02-24 | DRG: 480 | Disposition: A | Discharge status: Skilled Nursing Facility | Payer: Medicare PPO | Attending: Internal Medicine | Admitting: Internal Medicine

## 2021-02-13 ENCOUNTER — Other Ambulatory Visit: Payer: Self-pay

## 2021-02-13 ENCOUNTER — Encounter (HOSPITAL_COMMUNITY): Payer: Self-pay | Admitting: Emergency Medicine

## 2021-02-13 DIAGNOSIS — D696 Thrombocytopenia, unspecified: Secondary | ICD-10-CM | POA: Diagnosis present

## 2021-02-13 DIAGNOSIS — F0152 Vascular dementia, unspecified severity, with psychotic disturbance: Secondary | ICD-10-CM | POA: Diagnosis present

## 2021-02-13 DIAGNOSIS — F0282 Dementia in other diseases classified elsewhere, unspecified severity, with psychotic disturbance: Secondary | ICD-10-CM | POA: Diagnosis present

## 2021-02-13 DIAGNOSIS — F0154 Vascular dementia, unspecified severity, with anxiety: Secondary | ICD-10-CM | POA: Diagnosis present

## 2021-02-13 DIAGNOSIS — F0284 Dementia in other diseases classified elsewhere, unspecified severity, with anxiety: Secondary | ICD-10-CM | POA: Diagnosis present

## 2021-02-13 DIAGNOSIS — D62 Acute posthemorrhagic anemia: Secondary | ICD-10-CM | POA: Diagnosis not present

## 2021-02-13 DIAGNOSIS — Z66 Do not resuscitate: Secondary | ICD-10-CM | POA: Diagnosis not present

## 2021-02-13 DIAGNOSIS — F05 Delirium due to known physiological condition: Secondary | ICD-10-CM | POA: Diagnosis present

## 2021-02-13 DIAGNOSIS — R739 Hyperglycemia, unspecified: Secondary | ICD-10-CM | POA: Diagnosis not present

## 2021-02-13 DIAGNOSIS — I5043 Acute on chronic combined systolic (congestive) and diastolic (congestive) heart failure: Secondary | ICD-10-CM | POA: Diagnosis not present

## 2021-02-13 DIAGNOSIS — I4819 Other persistent atrial fibrillation: Secondary | ICD-10-CM | POA: Diagnosis present

## 2021-02-13 DIAGNOSIS — I5020 Unspecified systolic (congestive) heart failure: Secondary | ICD-10-CM | POA: Diagnosis not present

## 2021-02-13 DIAGNOSIS — E1165 Type 2 diabetes mellitus with hyperglycemia: Secondary | ICD-10-CM | POA: Diagnosis present

## 2021-02-13 DIAGNOSIS — I5041 Acute combined systolic (congestive) and diastolic (congestive) heart failure: Secondary | ICD-10-CM | POA: Diagnosis not present

## 2021-02-13 DIAGNOSIS — F0280 Dementia in other diseases classified elsewhere without behavioral disturbance: Secondary | ICD-10-CM | POA: Diagnosis present

## 2021-02-13 DIAGNOSIS — I428 Other cardiomyopathies: Secondary | ICD-10-CM | POA: Diagnosis not present

## 2021-02-13 DIAGNOSIS — N1831 Chronic kidney disease, stage 3a: Secondary | ICD-10-CM | POA: Diagnosis present

## 2021-02-13 DIAGNOSIS — Z7189 Other specified counseling: Secondary | ICD-10-CM

## 2021-02-13 DIAGNOSIS — S72141A Displaced intertrochanteric fracture of right femur, initial encounter for closed fracture: Principal | ICD-10-CM

## 2021-02-13 DIAGNOSIS — D638 Anemia in other chronic diseases classified elsewhere: Secondary | ICD-10-CM | POA: Diagnosis present

## 2021-02-13 DIAGNOSIS — S72001A Fracture of unspecified part of neck of right femur, initial encounter for closed fracture: Secondary | ICD-10-CM | POA: Diagnosis not present

## 2021-02-13 DIAGNOSIS — F0150 Vascular dementia without behavioral disturbance: Secondary | ICD-10-CM | POA: Diagnosis not present

## 2021-02-13 DIAGNOSIS — I97191 Other postprocedural cardiac functional disturbances following other surgery: Secondary | ICD-10-CM | POA: Diagnosis not present

## 2021-02-13 DIAGNOSIS — Z419 Encounter for procedure for purposes other than remedying health state, unspecified: Secondary | ICD-10-CM

## 2021-02-13 DIAGNOSIS — Y838 Other surgical procedures as the cause of abnormal reaction of the patient, or of later complication, without mention of misadventure at the time of the procedure: Secondary | ICD-10-CM | POA: Diagnosis not present

## 2021-02-13 DIAGNOSIS — I509 Heart failure, unspecified: Secondary | ICD-10-CM

## 2021-02-13 DIAGNOSIS — Y92129 Unspecified place in nursing home as the place of occurrence of the external cause: Secondary | ICD-10-CM

## 2021-02-13 DIAGNOSIS — C8583 Other specified types of non-Hodgkin lymphoma, intra-abdominal lymph nodes: Secondary | ICD-10-CM | POA: Diagnosis present

## 2021-02-13 DIAGNOSIS — Z803 Family history of malignant neoplasm of breast: Secondary | ICD-10-CM

## 2021-02-13 DIAGNOSIS — F02818 Dementia in other diseases classified elsewhere, unspecified severity, with other behavioral disturbance: Secondary | ICD-10-CM | POA: Diagnosis present

## 2021-02-13 DIAGNOSIS — E8779 Other fluid overload: Secondary | ICD-10-CM | POA: Diagnosis not present

## 2021-02-13 DIAGNOSIS — R0989 Other specified symptoms and signs involving the circulatory and respiratory systems: Secondary | ICD-10-CM | POA: Diagnosis present

## 2021-02-13 DIAGNOSIS — I959 Hypotension, unspecified: Secondary | ICD-10-CM | POA: Diagnosis present

## 2021-02-13 DIAGNOSIS — Z9221 Personal history of antineoplastic chemotherapy: Secondary | ICD-10-CM

## 2021-02-13 DIAGNOSIS — I130 Hypertensive heart and chronic kidney disease with heart failure and stage 1 through stage 4 chronic kidney disease, or unspecified chronic kidney disease: Secondary | ICD-10-CM | POA: Diagnosis present

## 2021-02-13 DIAGNOSIS — D631 Anemia in chronic kidney disease: Secondary | ICD-10-CM | POA: Diagnosis present

## 2021-02-13 DIAGNOSIS — Z20822 Contact with and (suspected) exposure to covid-19: Secondary | ICD-10-CM | POA: Diagnosis present

## 2021-02-13 DIAGNOSIS — F01518 Vascular dementia, unspecified severity, with other behavioral disturbance: Secondary | ICD-10-CM | POA: Diagnosis present

## 2021-02-13 DIAGNOSIS — Z91013 Allergy to seafood: Secondary | ICD-10-CM

## 2021-02-13 DIAGNOSIS — W1830XA Fall on same level, unspecified, initial encounter: Secondary | ICD-10-CM | POA: Diagnosis present

## 2021-02-13 DIAGNOSIS — I4821 Permanent atrial fibrillation: Secondary | ICD-10-CM | POA: Diagnosis present

## 2021-02-13 DIAGNOSIS — Z811 Family history of alcohol abuse and dependence: Secondary | ICD-10-CM

## 2021-02-13 DIAGNOSIS — G309 Alzheimer's disease, unspecified: Secondary | ICD-10-CM | POA: Diagnosis present

## 2021-02-13 DIAGNOSIS — E1122 Type 2 diabetes mellitus with diabetic chronic kidney disease: Secondary | ICD-10-CM | POA: Diagnosis present

## 2021-02-13 DIAGNOSIS — E8770 Fluid overload, unspecified: Secondary | ICD-10-CM | POA: Diagnosis present

## 2021-02-13 DIAGNOSIS — R54 Age-related physical debility: Secondary | ICD-10-CM | POA: Diagnosis present

## 2021-02-13 DIAGNOSIS — Z79899 Other long term (current) drug therapy: Secondary | ICD-10-CM

## 2021-02-13 DIAGNOSIS — Z6822 Body mass index (BMI) 22.0-22.9, adult: Secondary | ICD-10-CM

## 2021-02-13 DIAGNOSIS — S72001D Fracture of unspecified part of neck of right femur, subsequent encounter for closed fracture with routine healing: Secondary | ICD-10-CM | POA: Diagnosis not present

## 2021-02-13 DIAGNOSIS — I4891 Unspecified atrial fibrillation: Secondary | ICD-10-CM | POA: Diagnosis not present

## 2021-02-13 DIAGNOSIS — S72009A Fracture of unspecified part of neck of unspecified femur, initial encounter for closed fracture: Secondary | ICD-10-CM | POA: Diagnosis present

## 2021-02-13 DIAGNOSIS — I429 Cardiomyopathy, unspecified: Secondary | ICD-10-CM | POA: Diagnosis not present

## 2021-02-13 DIAGNOSIS — I252 Old myocardial infarction: Secondary | ICD-10-CM

## 2021-02-13 DIAGNOSIS — I2510 Atherosclerotic heart disease of native coronary artery without angina pectoris: Secondary | ICD-10-CM | POA: Diagnosis present

## 2021-02-13 DIAGNOSIS — M19042 Primary osteoarthritis, left hand: Secondary | ICD-10-CM | POA: Diagnosis present

## 2021-02-13 DIAGNOSIS — R0602 Shortness of breath: Secondary | ICD-10-CM

## 2021-02-13 DIAGNOSIS — W19XXXA Unspecified fall, initial encounter: Secondary | ICD-10-CM

## 2021-02-13 DIAGNOSIS — M19041 Primary osteoarthritis, right hand: Secondary | ICD-10-CM | POA: Diagnosis present

## 2021-02-13 DIAGNOSIS — Z515 Encounter for palliative care: Secondary | ICD-10-CM | POA: Diagnosis not present

## 2021-02-13 DIAGNOSIS — E43 Unspecified severe protein-calorie malnutrition: Secondary | ICD-10-CM | POA: Insufficient documentation

## 2021-02-13 DIAGNOSIS — Z9181 History of falling: Secondary | ICD-10-CM

## 2021-02-13 DIAGNOSIS — Z8249 Family history of ischemic heart disease and other diseases of the circulatory system: Secondary | ICD-10-CM

## 2021-02-13 DIAGNOSIS — Z7901 Long term (current) use of anticoagulants: Secondary | ICD-10-CM

## 2021-02-13 DIAGNOSIS — Z818 Family history of other mental and behavioral disorders: Secondary | ICD-10-CM

## 2021-02-13 DIAGNOSIS — Z807 Family history of other malignant neoplasms of lymphoid, hematopoietic and related tissues: Secondary | ICD-10-CM

## 2021-02-13 DIAGNOSIS — E876 Hypokalemia: Secondary | ICD-10-CM | POA: Diagnosis present

## 2021-02-13 DIAGNOSIS — R000 Tachycardia, unspecified: Secondary | ICD-10-CM | POA: Diagnosis present

## 2021-02-13 LAB — CBC
HCT: 36.2 % (ref 36.0–46.0)
Hemoglobin: 11.6 g/dL — ABNORMAL LOW (ref 12.0–15.0)
MCH: 31.5 pg (ref 26.0–34.0)
MCHC: 32 g/dL (ref 30.0–36.0)
MCV: 98.4 fL (ref 80.0–100.0)
Platelets: 156 10*3/uL (ref 150–400)
RBC: 3.68 MIL/uL — ABNORMAL LOW (ref 3.87–5.11)
RDW: 16.6 % — ABNORMAL HIGH (ref 11.5–15.5)
WBC: 8.6 10*3/uL (ref 4.0–10.5)
nRBC: 0 % (ref 0.0–0.2)

## 2021-02-13 LAB — BASIC METABOLIC PANEL
Anion gap: 9 (ref 5–15)
BUN: 16 mg/dL (ref 8–23)
CO2: 23 mmol/L (ref 22–32)
Calcium: 8.7 mg/dL — ABNORMAL LOW (ref 8.9–10.3)
Chloride: 108 mmol/L (ref 98–111)
Creatinine, Ser: 1.13 mg/dL — ABNORMAL HIGH (ref 0.44–1.00)
GFR, Estimated: 46 mL/min — ABNORMAL LOW (ref >60)
Glucose, Bld: 121 mg/dL — ABNORMAL HIGH (ref 70–99)
Potassium: 3.7 mmol/L (ref 3.5–5.1)
Sodium: 140 mmol/L (ref 135–145)

## 2021-02-13 LAB — SURGICAL PCR SCREEN
MRSA, PCR: NEGATIVE
Staphylococcus aureus: NEGATIVE

## 2021-02-13 LAB — RESP PANEL BY RT-PCR (FLU A&B, COVID) ARPGX2
Influenza A by PCR: NEGATIVE
Influenza B by PCR: NEGATIVE
SARS Coronavirus 2 by RT PCR: NEGATIVE

## 2021-02-13 MED ORDER — ACETAMINOPHEN 325 MG PO TABS
650.0000 mg | ORAL_TABLET | Freq: Four times a day (QID) | ORAL | Status: DC | PRN
  Administered 2021-02-13 – 2021-02-15 (×3): 650 mg via ORAL
  Filled 2021-02-13 (×2): qty 2

## 2021-02-13 MED ORDER — BISACODYL 5 MG PO TBEC
5.0000 mg | DELAYED_RELEASE_TABLET | Freq: Every day | ORAL | Status: DC | PRN
  Administered 2021-02-17 – 2021-02-20 (×4): 5 mg via ORAL
  Filled 2021-02-13 (×4): qty 1

## 2021-02-13 MED ORDER — FENTANYL CITRATE PF 50 MCG/ML IJ SOSY
25.0000 ug | PREFILLED_SYRINGE | Freq: Once | INTRAMUSCULAR | Status: AC
  Administered 2021-02-13: 25 ug via INTRAVENOUS
  Filled 2021-02-13: qty 1

## 2021-02-13 MED ORDER — METHOCARBAMOL 500 MG PO TABS
500.0000 mg | ORAL_TABLET | Freq: Four times a day (QID) | ORAL | Status: DC | PRN
  Administered 2021-02-14 – 2021-02-22 (×12): 500 mg via ORAL
  Filled 2021-02-13 (×11): qty 1

## 2021-02-13 MED ORDER — ENSURE PRE-SURGERY PO LIQD
296.0000 mL | Freq: Once | ORAL | Status: AC
  Administered 2021-02-14: 296 mL via ORAL
  Filled 2021-02-13: qty 296

## 2021-02-13 MED ORDER — FENTANYL CITRATE PF 50 MCG/ML IJ SOSY
25.0000 ug | PREFILLED_SYRINGE | INTRAMUSCULAR | Status: DC | PRN
  Administered 2021-02-13 (×2): 25 ug via INTRAVENOUS
  Filled 2021-02-13 (×2): qty 1

## 2021-02-13 MED ORDER — METHOCARBAMOL 1000 MG/10ML IJ SOLN: 500.0000 mg | Freq: Four times a day (QID) | INTRAVENOUS | Status: DC | PRN

## 2021-02-13 MED ORDER — OXYCODONE HCL 5 MG PO TABS
5.0000 mg | ORAL_TABLET | ORAL | Status: DC | PRN
  Administered 2021-02-13 – 2021-02-16 (×5): 5 mg via ORAL
  Administered 2021-02-16 – 2021-02-17 (×3): 10 mg via ORAL
  Administered 2021-02-19 – 2021-02-22 (×6): 5 mg via ORAL
  Filled 2021-02-13: qty 2
  Filled 2021-02-13 (×4): qty 1
  Filled 2021-02-13 (×2): qty 2
  Filled 2021-02-13 (×5): qty 1
  Filled 2021-02-13: qty 2

## 2021-02-13 MED ORDER — POLYETHYLENE GLYCOL 3350 17 G PO PACK
17.0000 g | PACK | Freq: Every day | ORAL | Status: DC | PRN
  Administered 2021-02-17 – 2021-02-20 (×3): 17 g via ORAL
  Filled 2021-02-13 (×3): qty 1

## 2021-02-13 MED ORDER — DILTIAZEM HCL ER 60 MG PO CP12
60.0000 mg | ORAL_CAPSULE | Freq: Every day | ORAL | Status: DC
  Administered 2021-02-13 – 2021-02-15 (×3): 60 mg via ORAL
  Filled 2021-02-13 (×4): qty 1

## 2021-02-13 MED ORDER — ONDANSETRON HCL 4 MG/2ML IJ SOLN
4.0000 mg | Freq: Four times a day (QID) | INTRAMUSCULAR | Status: DC | PRN
  Administered 2021-02-13 – 2021-02-17 (×2): 4 mg via INTRAVENOUS
  Filled 2021-02-13 (×2): qty 2

## 2021-02-13 MED ORDER — CHLORHEXIDINE GLUCONATE 4 % EX LIQD
60.0000 mL | Freq: Once | CUTANEOUS | Status: AC
  Administered 2021-02-14: 4 via TOPICAL
  Filled 2021-02-13: qty 60

## 2021-02-13 MED ORDER — DOCUSATE SODIUM 100 MG PO CAPS
100.0000 mg | ORAL_CAPSULE | Freq: Two times a day (BID) | ORAL | Status: DC
  Administered 2021-02-15 – 2021-02-24 (×16): 100 mg via ORAL
  Filled 2021-02-13 (×22): qty 1

## 2021-02-13 MED ORDER — MORPHINE SULFATE (PF) 2 MG/ML IV SOLN
2.0000 mg | INTRAVENOUS | Status: DC | PRN
  Administered 2021-02-13 – 2021-02-18 (×4): 2 mg via INTRAVENOUS
  Filled 2021-02-13 (×4): qty 1

## 2021-02-13 MED ORDER — QUETIAPINE FUMARATE 25 MG PO TABS
25.0000 mg | ORAL_TABLET | Freq: Every day | ORAL | Status: DC
  Administered 2021-02-13 – 2021-02-16 (×4): 25 mg via ORAL
  Filled 2021-02-13 (×6): qty 1

## 2021-02-13 NOTE — Assessment & Plan Note (Signed)
-  I have discussed code status with the patient's son and daughter and they are in agreement that the patient would not desire resuscitation and would prefer to die a natural death should that situation arise. -She will need a gold out of facility DNR form at the time of discharge -They also prefer to minimize medications on an ongoing basis if possible

## 2021-02-13 NOTE — ED Notes (Signed)
Patients son, Lafonda Mosses and given an update via telephone at this time.

## 2021-02-13 NOTE — Assessment & Plan Note (Signed)
-  Mild hyperglycemia without known h/o DM -She is unlikely to suffer long-term effects of uncontrolled DM regardless -Would not monitor and recommend surveillance only with significantly elevated glucose

## 2021-02-13 NOTE — Assessment & Plan Note (Signed)
-  Rate controlled with Diltiazem but she is late for dose so continues to have tachycardia in the 120s -Will monitor on telemetry -She has been on Eliquis but this is on hold -Consider fall risk when deciding whether to resume

## 2021-02-13 NOTE — ED Provider Notes (Addendum)
[facility] EMERGENCY DEPARTMENT Provider Note   CSN: [id number] Arrival date & time: 02/13/21  3846     History  Chief Complaint  Patient presents with   Fall   Hip Pain    Kathy Massey is a 88 y.o. female.  88 year old female presents today for an unwitnessed fall that occurred today at the nursing home facility.  Patient is a poor historian.  She is alert oriented x1 (only to her name).  Unsure of her baseline.  She does endorse pain to her right hip, and left shoulder. Per EMS staff responded to patient calling out for an unwitnessed fall and was found on her left side in the bathroom.  The history is provided by the patient. No language interpreter was used.      Home Medications Prior to Admission medications   Medication Sig Start Date End Date Taking? Authorizing Provider  Acetaminophen (TYLENOL 8 HOUR PO) Take by mouth as needed.    [provider]  apixaban (ELIQUIS) 2.5 MG TABS tablet TAKE 1 TABLET BY MOUTH TWICE A DAY 10/26/20   Camnitz, Ocie Doyne, MD  atorvastatin (LIPITOR) 20 MG tablet Take 20 mg by mouth daily.    [provider]  clindamycin (CLEOCIN-T) 1 % lotion Apply topically 2 (two) times daily. 06/03/19   [name], MD  clobetasol ointment (TEMOVATE) 0.05 % Apply topically. 09/09/18   [provider]  cyanocobalamin 1000 MCG tablet Take by mouth.    [provider]  diltiazem (CARDIZEM SR) 60 MG 12 hr capsule Take 1 tablet (60 mg total) once daily.  You may take a second tablet daily if needed for blood pressure and/or heart rate. 03/05/18   Camnitz, Will Hassell Done, MD  escitalopram (LEXAPRO) 5 MG tablet Take 0.5 tablets by mouth daily. 09/06/20   [provider]  fluocinonide-emollient (LIDEX-E) 0.05 % cream APPLY SPARINGLY TO AFFECTED AREA TWICE A DAY AS DIRECTED 04/01/20   [provider]  memantine (NAMENDA) 10 MG tablet Take 1 BID for memory. 11/11/20   [provider]       Allergies    Shellfish allergy    Review of Systems   Review of Systems  Unable to perform ROS: Mental status change  Musculoskeletal:  Positive for arthralgias.   Physical Exam Updated Vital Signs BP (!) 116/93 (BP Location: Right Arm)    Pulse 62    Temp 98.5 F (36.9 C) (Oral)    Resp 18    SpO2 100%  Physical Exam Vitals and nursing note reviewed.  Constitutional:      General: She is not in acute distress.    Appearance: Normal appearance. She is not ill-appearing.  HENT:     Head: Normocephalic and atraumatic.     Nose: Nose normal.  Eyes:     Conjunctiva/sclera: Conjunctivae normal.  Cardiovascular:     Rate and Rhythm: Tachycardia present.  Pulmonary:     Effort: Pulmonary effort is normal. No respiratory distress.     Breath sounds: No wheezing.  Abdominal:     General: There is no distension.     Tenderness: There is no abdominal tenderness. There is no guarding.  Musculoskeletal:        General: No deformity.     Cervical back: Normal range of motion.     Comments: Patient's hip stabilized with a draw sheet.  Right hip with minimal external rotation.  Tenderness to palpation present on bilateral hips.  Left shoulder  with mild tenderness palpation present however full range of motion preserved.  Without tenderness palpation of cervical, thoracic, lumbar spine.  Skin:    Findings: No rash.  Neurological:     Mental Status: She is alert.    ED Results / Procedures / Treatments   Labs (all labs ordered are listed, but only abnormal results are displayed) Labs Reviewed  CBC  BASIC METABOLIC PANEL    EKG None  Radiology No results found.  Procedures Procedures    Medications Ordered in ED Medications - No data to display  ED Course/ Medical Decision Making/ A&P Clinical Course as of 02/13/21 0956  Sun Feb 13, 2021  0839 DG Hip Kaylyn Layer W or Wo Pelvis 2-3 Views Right [AA]    Clinical Course User Index [AA] Evlyn Courier, PA-C                            Medical Decision Making  88 year old female presents today for evaluation following unwitnessed fall and complaint of right hip pain.  Patient is in A. fib with rates between 100-1 20.  Patient has history of A. fib and is on Eliquis with last dose last night.  She denies complaints of chest pain, shortness of breath.  Initial work-up significant for hemoglobin of 11.6, without leukocytosis, creatinine of 1.13 otherwise chemistry panel without concerning findings.  CT head, CT cervical spine without acute concerning findings.  X-ray of left shoulder and left hip without acute fractures.  X-ray of right hip with displaced fracture of right intertrochanteric region. Attempted to call on-call orthopedic surgery x2 without success.  We will continue to try. Will discuss with hospitalist for potential admission.  Daughter and son both aware of plan for admission and findings of displaced intertrochanteric fracture.  Case discussed with hospitalist Dr. Lorin Mercy who will accept patient for admission.   Discussed with orthopedic surgeon who plans to proceed with procedure tomorrow and request Eliquis to be held in the meantime.  Hospitalist made aware.      Final Clinical Impression(s) / ED Diagnoses Final diagnoses:  Fall, initial encounter  Fracture, intertrochanteric, right femur, closed, initial encounter [facility])  Atrial fibrillation, unspecified type [facility])    Rx / DC Orders ED Discharge Orders     None         Evlyn Courier, PA-C 02/13/21 1022    Evlyn Courier, PA-C 02/13/21 1046    Tegeler, Gwenyth Allegra, MD 02/13/21 1429

## 2021-02-13 NOTE — ED Triage Notes (Signed)
Per EMS, pt from [place], called out for an unwitnessed fall.  Staff is not sure how long she was on the floor, pt confused at baseline.  She was found left lateral on the bathroom floor.  Pt is on blood thinners, no evidence she hit her head and she denies.  C/o right hip pain, unable to straighten legs to exam legs.    Given 100mg cs fentanyl Afib 90-120 (hx of same) 148/84

## 2021-02-13 NOTE — Assessment & Plan Note (Signed)
-  Appears to be stable at this time -Will trend

## 2021-02-13 NOTE — H&P (Signed)
History and Physical    Patient: Kathy Massey CBS:[id number] DOB: 1927/04/17 DOA: 02/13/2021 DOS: the patient was seen and examined on 02/13/2021 PCP: [name], MD  Patient coming from: [facility]  Chief Complaint: Fall   HPI: Kathy Massey is a 88 y.o. female with medical history significant of afib on Eliquis; CAD; HTN; and dementia; h/o lymphoma presenting with a fall.  Her children were present and provided all history.  She lives in memory care with advanced dementia; she has behavioral disturbance starting about 6pm and gets quite agitated and so has been on very low-dose Lexapro 2.5 mg daily.  Her primary complaint is dizziness and they think that she has been on too many medications and would like her med list trimmed.  They knew a fall was inevitable; she fell a couple of months ago and broke her left clavicle.  She walks unassisted.  She was found in the floor of her room about 530 AM.  She is alert to person only and has no idea what happened.    ER Course:  Unwitnessed fall, R hip fracture.  Ortho pending.    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 EF > 55% by 06/16/15 echo.   ELBOW BURSA SURGERY Right 2016   EYE SURGERY     cataracts removed ,both eyes, /w IOL   LYMPH NODE BIOPSY Left 01/14/2018   Procedure: EXCISION DEEP LEFT INGUINAL LYMPH NODE BIOPSY ERAS PATHWAY;  Surgeon: Fanny Skates, MD;  Location: [place];  Service: General;  Laterality: Left;   TONSILLECTOMY     Social  History:  reports that she has never smoked. She has never used smokeless tobacco. She reports current alcohol use of about 1.0 standard drink per week. She reports that she does not use drugs.  Allergies  Allergen Reactions   Shellfish Allergy Itching and Swelling    Shrimp     Family History  Problem Relation Age of Onset   Heart disease Mother    Depression Mother    Heart failure Mother    Heart disease Father    Alcohol abuse Father    Heart attack Father    Depression Father    Multiple myeloma Maternal Grandmother    Alcohol abuse Maternal Grandfather    Breast cancer Paternal Grandmother    Breast cancer Daughter     Prior to Admission medications   Medication Sig Start Date End Date Taking? Authorizing Provider  Acetaminophen (TYLENOL 8 HOUR PO) Take by mouth as needed.    [provider]  apixaban (ELIQUIS) 2.5 MG TABS tablet TAKE 1 TABLET BY MOUTH TWICE A DAY 10/26/20   Camnitz, Ocie Doyne, MD  atorvastatin (LIPITOR) 20 MG tablet Take 20 mg by mouth daily.    [provider]  clindamycin (CLEOCIN-T) 1 % lotion Apply topically 2 (two) times daily. 06/03/19   [name], MD  clobetasol ointment (TEMOVATE) 0.05 % Apply topically. 09/09/18   [provider]  cyanocobalamin 1000 MCG tablet Take by mouth.    [provider]  diltiazem (CARDIZEM SR) 60 MG 12 hr capsule Take 1 tablet (60 mg total) once daily.  You may take a second tablet daily if needed for blood pressure and/or heart rate. 03/05/18   Camnitz, Will Hassell Done, MD  escitalopram (LEXAPRO) 5 MG tablet Take 0.5 tablets by mouth daily. 09/06/20   [provider]  fluocinonide-emollient (LIDEX-E) 0.05 % cream APPLY SPARINGLY TO AFFECTED AREA TWICE A DAY AS DIRECTED 04/01/20   [provider]  memantine (NAMENDA) 10 MG tablet Take 1 BID for memory. 11/11/20   [provider]    Physical Exam: Vitals:   02/13/21 0900 02/13/21 0930 02/13/21 1030 02/13/21  1130  BP: 111/88 (!) 123/101 123/85 120/69  Pulse: 64 (!) 126 (!) 116 (!) 120  Resp: (!) 29 (!) 23 (!) 23 (!) 26  Temp:      TempSrc:      SpO2: 98% 98% 90% 97%  Weight:      Height:       General:  Appears calm and comfortable and is in NAD, pleasantly demented Eyes:  PERRL, EOMI, normal lids, iris ENT:  grossly normal hearing, lips & tongue, mmm; appropriate dentition Neck:  no LAD, masses or thyromegaly Cardiovascular: Irregularly irregular with tachycardia, no m/r/g. 1+ LE edema.  Respiratory:   CTA bilaterally with no wheezes/rales/rhonchi.  Mildly increased respiratory effort. Abdomen:  soft, NT, ND Skin:  no rash or induration seen on limited exam Musculoskeletal:  R leg is mildly shortened compared to left; patient has apparent TTP with light touch along the right hip and anterior thigh Psychiatric:  pleasantly confused mood and affect, speech sparse but appropriate, AOx1 Neurologic:  CN 2-12 grossly intact with limited patient participation   Radiological Exams on Admission: Independently reviewed - see discussion in A/P where applicable  CT Head Wo Contrast  Result Date: 02/13/2021 CLINICAL DATA:  Un witnessed fall. Headache. Patient is on blood thinners. EXAM: CT HEAD WITHOUT CONTRAST CT CERVICAL SPINE WITHOUT CONTRAST TECHNIQUE: Multidetector CT imaging of the head and cervical spine was performed following the standard protocol without intravenous contrast. Multiplanar CT image reconstructions of the cervical spine were also generated. COMPARISON:  None. FINDINGS: CT HEAD FINDINGS Brain: No evidence of acute infarction, hemorrhage, hydrocephalus, extra-axial collection or mass lesion/mass effect. Ventricular and sulcal enlargement consistent with moderate atrophy. Patchy areas of white matter hypoattenuation are also noted consistent with mild chronic microvascular ischemic change. Vascular: No hyperdense vessel or unexpected calcification. Skull: Normal. Negative for fracture  or focal lesion. Sinuses/Orbits: Globes and orbits are unremarkable. Sinuses are clear. Other: None. CT CERVICAL SPINE FINDINGS Alignment: Normal. Skull base and vertebrae: No acute fracture. No primary bone lesion or focal pathologic process. Soft tissues and spinal canal: No prevertebral fluid or swelling. No visible canal hematoma. Disc levels: Moderate loss of disc height at C3-C4 and C4-C5 and C6-C7. Mild disc bulging and endplate spurring noted at these levels. No convincing disc herniation. Upper chest: Multiple bone fragments adjacent to the distal left clavicle suspected to be an old fracture, but possibly acute/recent. Other: None. IMPRESSION: HEAD CT 1. No acute intracranial abnormalities. CERVICAL CT 1. No spine fracture or malalignment. 2. Distal left clavicle fractures, incompletely imaged and possibly chronic. Clinical correlation recommended. Electronically Signed   By: Lajean Manes M.D.   On: 02/13/2021 09:03   CT Cervical Spine Wo Contrast  Result Date: 02/13/2021 CLINICAL DATA:  Un witnessed  fall. Headache. Patient is on blood thinners. EXAM: CT HEAD WITHOUT CONTRAST CT CERVICAL SPINE WITHOUT CONTRAST TECHNIQUE: Multidetector CT imaging of the head and cervical spine was performed following the standard protocol without intravenous contrast. Multiplanar CT image reconstructions of the cervical spine were also generated. COMPARISON:  None. FINDINGS: CT HEAD FINDINGS Brain: No evidence of acute infarction, hemorrhage, hydrocephalus, extra-axial collection or mass lesion/mass effect. Ventricular and sulcal enlargement consistent with moderate atrophy. Patchy areas of white matter hypoattenuation are also noted consistent with mild chronic microvascular ischemic change. Vascular: No hyperdense vessel or unexpected calcification. Skull: Normal. Negative for fracture or focal lesion. Sinuses/Orbits: Globes and orbits are unremarkable. Sinuses are clear. Other: None. CT CERVICAL SPINE FINDINGS  Alignment: Normal. Skull base and vertebrae: No acute fracture. No primary bone lesion or focal pathologic process. Soft tissues and spinal canal: No prevertebral fluid or swelling. No visible canal hematoma. Disc levels: Moderate loss of disc height at C3-C4 and C4-C5 and C6-C7. Mild disc bulging and endplate spurring noted at these levels. No convincing disc herniation. Upper chest: Multiple bone fragments adjacent to the distal left clavicle suspected to be an old fracture, but possibly acute/recent. Other: None. IMPRESSION: HEAD CT 1. No acute intracranial abnormalities. CERVICAL CT 1. No spine fracture or malalignment. 2. Distal left clavicle fractures, incompletely imaged and possibly chronic. Clinical correlation recommended. Electronically Signed   By: Lajean Manes M.D.   On: 02/13/2021 09:03   DG Shoulder Left  Result Date: 02/13/2021 CLINICAL DATA:  Status post fall with left shoulder pain. EXAM: LEFT SHOULDER - 2+ VIEW COMPARISON:  December 29, 2020 FINDINGS: Chronic change of the left clavicle is noted. There is no acute fracture or dislocation. The visualized lung field is normal. IMPRESSION: No acute fracture or dislocation. Electronically Signed   By: Abelardo Diesel M.D.   On: 02/13/2021 08:29   DG Hip Unilat W or Wo Pelvis 2-3 Views Left  Result Date: 02/13/2021 CLINICAL DATA:  Status post fall with right hip pain. EXAM: DG HIP (WITH OR WITHOUT PELVIS) 2-3V LEFT COMPARISON:  None. FINDINGS: There is no evidence of hip fracture or dislocation. Degenerative joint changes of left hip are noted. IMPRESSION: No acute fracture or dislocation of left hip. Electronically Signed   By: Abelardo Diesel M.D.   On: 02/13/2021 08:28   DG Hip Unilat W or Wo Pelvis 2-3 Views Right  Result Date: 02/13/2021 CLINICAL DATA:  Status post fall with right hip pain. EXAM: DG HIP (WITH OR WITHOUT PELVIS) 2-3V RIGHT COMPARISON:  None. FINDINGS: Displaced fracture of the right intertrochanteric region extending to the  proximal right femoral shaft is identified. Degenerative joint changes of the left hip and. IMPRESSION: Displaced fracture of right intertrochanteric region extending to the proximal right femoral shaft. Electronically Signed   By: Abelardo Diesel M.D.   On: 02/13/2021 08:27    EKG: Independently reviewed.  Afib with rate 129; nonspecific ST changes  that may be rate related   Labs on Admission: I have personally reviewed the available labs and imaging studies at the time of the admission.  Pertinent labs:   Glucose 121 BUN 16/Creatinine 1.13/GFR 46 - stable WBC 8.6 Hgb 11.6     Assessment/Plan * Hip fracture (HCC)- (present on admission) -Apparently mechanical fall resulting in hip fracture -Orthopedics consulted -NPO after midnight in anticipation of surgical repair tomorrow -SCDs overnight, start Lovenox post-operatively (or as per ortho) -Pain control with Tylenol, Robaxin, Oxycodone, and Morphine prn -TOC team consult for rehab  placement -Will need PT consult post-operatively -Hip fracture order set utilized -TXA per orthopedics -Fascia iliacus block ordered per anesthesia  Pre-operative stratification -Orthopedic/spinal surgery is associated with an intermediate (1-5%) cardiovascular risk for cardiac death and nonfatal MI -Her revised cardiac index gives a risk estimate of 3.9% -Her Detsky's Modified Cardiac Risk Index score is 15, with a low cardiac risk -It is reasonable for her to go to the OR without additional evaluation  Hyperglycemia- (present on admission) -Mild hyperglycemia without known h/o DM -She is unlikely to suffer long-term effects of uncontrolled DM regardless -Would not monitor and recommend surveillance only with significantly elevated glucose  DNR (do not resuscitate)- (present on admission) -I have discussed code status with the patient's son and daughter and they are in agreement that the patient would not desire resuscitation and would prefer to  die a natural death should that situation arise. -She will need a gold out of facility DNR form at the time of discharge -They also prefer to minimize medications on an ongoing basis if possible  Mixed Alzheimer's and vascular dementia ([place])- (present on admission) -Family describes significant sundowning (they refer to it as "anxiety" but describe behavioral disturbance -She was started on low-dose Lexapro (2.5 mg) without obvious improvement -Will stop Lexapro and add Seroquel nightly at 6 pm -Will hold Namenda and consider stopping this, as well; she has advanced disease and is unlikely to have benefit from ongoing therapy -Delirium precautions -Daughter will plan to stay tomorrow night but is available sooner if needed -Telesitter  Persistent atrial fibrillation ([place])- (present on admission) -Rate controlled with Diltiazem but she is late for dose so continues to have tachycardia in the 120s -Will monitor on telemetry -She has been on Eliquis but this is on hold -Consider fall risk when deciding whether to resume  Stage 3a chronic kidney disease (CKD) ([place])- (present on admission) -Appears to be stable at this time -Will trend  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  -She was treated with acalabrutinib for recurrent disease, stopped due to toxicity -No obvious evidence of recurrence -Followed by Dr. Marin Olp -Last seen in Dec, plan for spring f/u    Advance Care Planning:   Code Status: DNR   Consults: Orthopedics; nutrition; TOC team; will need PT post-operatively  Family Communication: Son and daughter were present throughout evaluation  Severity of Illness: The appropriate patient status for this patient is INPATIENT. Inpatient status is judged to be  reasonable and necessary in order to provide the required intensity of service to ensure the patient's safety. The patient's presenting symptoms, physical exam findings, and initial radiographic and laboratory data in the context of their chronic comorbidities is felt to place them at high risk for further clinical deterioration. Furthermore, it is not anticipated that the patient will be medically stable for discharge from the hospital within 2 midnights of admission.   * I certify that at the point of admission it is my clinical judgment that the patient will require inpatient hospital care spanning beyond 2 midnights from the point of admission due to high intensity of service, high risk for further deterioration and high frequency of surveillance required.*  Author: Karmen Bongo, M.D. 02/13/2021 1:31 PM  For on call review [email or web address].

## 2021-02-13 NOTE — Assessment & Plan Note (Signed)
-  Apparently mechanical fall resulting in hip fracture -Orthopedics consulted -NPO after midnight in anticipation of surgical repair tomorrow -SCDs overnight, start Lovenox post-operatively (or as per ortho) -Pain control with Tylenol, Robaxin, Oxycodone, and Morphine prn -TOC team consult for rehab placement -Will need PT consult post-operatively -Hip fracture order set utilized -TXA per orthopedics -Fascia iliacus block ordered per anesthesia  Pre-operative stratification -Orthopedic/spinal surgery is associated with an intermediate (1-5%) cardiovascular risk for cardiac death and nonfatal MI -Her revised cardiac index gives a risk estimate of 3.9% -Her Detsky's Modified Cardiac Risk Index score is 15, with a low cardiac risk -It is reasonable for her to go to the OR without additional evaluation

## 2021-02-13 NOTE — Assessment & Plan Note (Signed)
-  Recurrent small B-cell follicular NHL -She was originally treated with systemic chemotherapy with Rituxan/Treanda followed by maintenance Rituxan completed in February 2018. -She was treated with acalabrutinib for recurrent disease, stopped due to toxicity -No obvious evidence of recurrence -Followed by Dr. Marin Olp -Last seen in Dec, plan for spring f/u

## 2021-02-13 NOTE — H&P (Signed)
ORTHOPAEDIC CONSULTATION  REQUESTING PHYSICIAN: Karmen Bongo, MD  Chief Complaint: Right hip pain  HPI: Kathy Massey is a 88 y.o. female who presents with presents today with a right intertrochanteric hip fracture after a fall from standing.  This was unwitnessed at her rehab unit.  She resides at [facility] full-time.  She is according to her daughter a very active ambulator with walker.  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 EF > 55% by 06/16/15 echo.   ELBOW BURSA SURGERY Right 2016   EYE SURGERY     cataracts removed ,both eyes, /w IOL   LYMPH NODE BIOPSY Left 01/14/2018   Procedure: EXCISION DEEP LEFT INGUINAL LYMPH NODE BIOPSY ERAS PATHWAY;  Surgeon: Fanny Skates, MD;  Location: [facility];  Service: General;  Laterality: Left;   TONSILLECTOMY     Social History   Socioeconomic History   Marital status: Widowed    Spouse name: Not on file   Number of children: Not on file   Years of education: Not on file   Highest education level: Not on file  Occupational History   Not on file  Tobacco Use   Smoking status: Never   Smokeless tobacco: Never   Tobacco comments:    never used tobacco  Vaping Use   Vaping Use: Never used  Substance and Sexual Activity   Alcohol use: Yes    Alcohol/week: 1.0 standard drink    Types: 1 Glasses of wine per week   Drug use: No   Sexual activity: Not on file  Other Topics Concern   Not on file  Social  History Narrative   Not on file   Social Determinants of Health   Financial Resource Strain: Not on file  Food Insecurity: Not on file  Transportation Needs: Not on file  Physical Activity: Not on file  Stress: Not on file  Social Connections: Not on file   Family History  Problem Relation Age of Onset   Heart disease Mother    Depression Mother    Heart failure Mother    Heart disease Father    Alcohol abuse Father    Heart attack Father    Depression Father    Multiple myeloma Maternal Grandmother    Alcohol abuse Maternal Grandfather    Breast cancer Paternal Grandmother    Breast cancer Daughter    - negative except otherwise stated in the family history section Allergies  Allergen Reactions   Shellfish Allergy Itching and Swelling    Shrimp    Prior to Admission medications   Medication Sig Start Date End Date Taking? Authorizing Provider  Acetaminophen (TYLENOL 8 HOUR PO) Take by mouth as needed.    [provider]  apixaban (ELIQUIS) 2.5 MG TABS tablet TAKE 1 TABLET BY MOUTH TWICE A DAY 10/26/20   Camnitz, Ocie Doyne, MD  atorvastatin (LIPITOR) 20 MG tablet Take 20 mg by mouth daily.  [provider]  clindamycin (CLEOCIN-T) 1 % lotion Apply topically 2 (two) times daily. 06/03/19   [name], MD  clobetasol ointment (TEMOVATE) 0.05 % Apply topically. 09/09/18   [provider]  cyanocobalamin 1000 MCG tablet Take by mouth.    [provider]  diltiazem (CARDIZEM SR) 60 MG 12 hr capsule Take 1 tablet (60 mg total) once daily.  You may take a second tablet daily if needed for blood pressure and/or heart rate. 03/05/18   Camnitz, Will Hassell Done, MD  escitalopram (LEXAPRO) 5 MG tablet Take 0.5 tablets by mouth daily. 09/06/20   [provider]  fluocinonide-emollient (LIDEX-E) 0.05 % cream APPLY SPARINGLY TO AFFECTED AREA TWICE A DAY AS DIRECTED 04/01/20   [provider]  memantine (NAMENDA) 10 MG tablet Take 1  BID for memory. 11/11/20   [provider]   CT Head Wo Contrast  Result Date: 02/13/2021 CLINICAL DATA:  Un witnessed fall. Headache. Patient is on blood thinners. EXAM: CT HEAD WITHOUT CONTRAST CT CERVICAL SPINE WITHOUT CONTRAST TECHNIQUE: Multidetector CT imaging of the head and cervical spine was performed following the standard protocol without intravenous contrast. Multiplanar CT image reconstructions of the cervical spine were also generated. COMPARISON:  None. FINDINGS: CT HEAD FINDINGS Brain: No evidence of acute infarction, hemorrhage, hydrocephalus, extra-axial collection or mass lesion/mass effect. Ventricular and sulcal enlargement consistent with moderate atrophy. Patchy areas of white matter hypoattenuation are also noted consistent with mild chronic microvascular ischemic change. Vascular: No hyperdense vessel or unexpected calcification. Skull: Normal. Negative for fracture or focal lesion. Sinuses/Orbits: Globes and orbits are unremarkable. Sinuses are clear. Other: None. CT CERVICAL SPINE FINDINGS Alignment: Normal. Skull base and vertebrae: No acute fracture. No primary bone lesion or focal pathologic process. Soft tissues and spinal canal: No prevertebral fluid or swelling. No visible canal hematoma. Disc levels: Moderate loss of disc height at C3-C4 and C4-C5 and C6-C7. Mild disc bulging and endplate spurring noted at these levels. No convincing disc herniation. Upper chest: Multiple bone fragments adjacent to the distal left clavicle suspected to be an old fracture, but possibly acute/recent. Other: None. IMPRESSION: HEAD CT 1. No acute intracranial abnormalities. CERVICAL CT 1. No spine fracture or malalignment. 2. Distal left clavicle fractures, incompletely imaged and possibly chronic. Clinical correlation recommended. Electronically Signed   By: Lajean Manes M.D.   On: 02/13/2021 09:03   CT Cervical Spine Wo Contrast  Result Date: 02/13/2021 CLINICAL DATA:  Un witnessed  fall. Headache. Patient is on blood thinners. EXAM: CT HEAD WITHOUT CONTRAST CT CERVICAL SPINE WITHOUT CONTRAST TECHNIQUE: Multidetector CT imaging of the head and cervical spine was performed following the standard protocol without intravenous contrast. Multiplanar CT image reconstructions of the cervical spine were also generated. COMPARISON:  None. FINDINGS: CT HEAD FINDINGS Brain: No evidence of acute infarction, hemorrhage, hydrocephalus, extra-axial collection or mass lesion/mass effect. Ventricular and sulcal enlargement consistent with moderate atrophy. Patchy areas of white matter hypoattenuation are also noted consistent with mild chronic microvascular ischemic change. Vascular: No hyperdense vessel or unexpected calcification. Skull: Normal. Negative for fracture or focal lesion. Sinuses/Orbits: Globes and orbits are unremarkable. Sinuses are clear. Other: None. CT CERVICAL SPINE FINDINGS Alignment: Normal. Skull base and vertebrae: No acute fracture. No primary bone lesion or focal pathologic process. Soft tissues and spinal canal: No prevertebral fluid or swelling. No visible canal hematoma. Disc levels: Moderate loss of disc height at C3-C4 and C4-C5 and C6-C7. Mild disc bulging and endplate spurring noted at these  levels. No convincing disc herniation. Upper chest: Multiple bone fragments adjacent to the distal left clavicle suspected to be an old fracture, but possibly acute/recent. Other: None. IMPRESSION: HEAD CT 1. No acute intracranial abnormalities. CERVICAL CT 1. No spine fracture or malalignment. 2. Distal left clavicle fractures, incompletely imaged and possibly chronic. Clinical correlation recommended. Electronically Signed   By: Lajean Manes M.D.   On: 02/13/2021 09:03   DG Shoulder Left  Result Date: 02/13/2021 CLINICAL DATA:  Status post fall with left shoulder pain. EXAM: LEFT SHOULDER - 2+ VIEW COMPARISON:  December 29, 2020 FINDINGS: Chronic change of the left clavicle is noted.  There is no acute fracture or dislocation. The visualized lung field is normal. IMPRESSION: No acute fracture or dislocation. Electronically Signed   By: Abelardo Diesel M.D.   On: 02/13/2021 08:29   DG Hip Unilat W or Wo Pelvis 2-3 Views Left  Result Date: 02/13/2021 CLINICAL DATA:  Status post fall with right hip pain. EXAM: DG HIP (WITH OR WITHOUT PELVIS) 2-3V LEFT COMPARISON:  None. FINDINGS: There is no evidence of hip fracture or dislocation. Degenerative joint changes of left hip are noted. IMPRESSION: No acute fracture or dislocation of left hip. Electronically Signed   By: Abelardo Diesel M.D.   On: 02/13/2021 08:28   DG Hip Unilat W or Wo Pelvis 2-3 Views Right  Result Date: 02/13/2021 CLINICAL DATA:  Status post fall with right hip pain. EXAM: DG HIP (WITH OR WITHOUT PELVIS) 2-3V RIGHT COMPARISON:  None. FINDINGS: Displaced fracture of the right intertrochanteric region extending to the proximal right femoral shaft is identified. Degenerative joint changes of the left hip and. IMPRESSION: Displaced fracture of right intertrochanteric region extending to the proximal right femoral shaft. Electronically Signed   By: Abelardo Diesel M.D.   On: 02/13/2021 08:27     Positive ROS: All other systems have been reviewed and were otherwise negative with the exception of those mentioned in the HPI and as above.  Physical Exam: General: No acute distress Cardiovascular: No pedal edema Respiratory: No cyanosis, no use of accessory musculature GI: No organomegaly, abdomen is soft and non-tender Skin: No lesions in the area of chief complaint Neurologic: Sensation intact distally Psychiatric: Patient is at baseline mood and affect Lymphatic: No axillary or cervical lymphadenopathy  MUSCULOSKELETAL:  Right hip is shortened externally rotated.  Unable to comply with physical examination.  She does dorsiflex and plantarflex right foot spontaneously.  2+ dorsalis pedis pulse warm well perfused  toes  Independent Imaging Review: X-rays AP pelvis 2 views right hip: Displaced right intertrochanteric hip fracture  Assessment: 88 year old female with right displaced intertrochanteric hip fracture after a fall while at her nursing home.  At this time I have had a long discussion with her daughter.  They would like to proceed with surgical fixation in an effort to mobilize her so that she does not suffer the complications that can come with nonweightbearing.    Plan: Plan for right hip intertrochanteric hip fixation with nail  Thank you for the consult and the opportunity to see Ms. Elling  Vanetta Mulders, MD [facility] 12:01 PM

## 2021-02-13 NOTE — Assessment & Plan Note (Signed)
-  Family describes significant sundowning (they refer to it as "anxiety" but describe behavioral disturbance -She was started on low-dose Lexapro (2.5 mg) without obvious improvement -Will stop Lexapro and add Seroquel nightly at 6 pm -Will hold Namenda and consider stopping this, as well; she has advanced disease and is unlikely to have benefit from ongoing therapy -Delirium precautions -Daughter will plan to stay tomorrow night but is available sooner if needed -Telesitter

## 2021-02-14 ENCOUNTER — Encounter (HOSPITAL_COMMUNITY)
Admission: EM | Disposition: A | Discharge status: Skilled Nursing Facility | Payer: Self-pay | Source: Home / Self Care | Attending: Internal Medicine

## 2021-02-14 ENCOUNTER — Inpatient Hospital Stay (HOSPITAL_COMMUNITY): Payer: Medicare PPO

## 2021-02-14 ENCOUNTER — Inpatient Hospital Stay (HOSPITAL_COMMUNITY): Payer: Medicare PPO | Admitting: Anesthesiology

## 2021-02-14 DIAGNOSIS — S72001A Fracture of unspecified part of neck of right femur, initial encounter for closed fracture: Secondary | ICD-10-CM | POA: Diagnosis not present

## 2021-02-14 DIAGNOSIS — Z66 Do not resuscitate: Secondary | ICD-10-CM | POA: Diagnosis not present

## 2021-02-14 DIAGNOSIS — R739 Hyperglycemia, unspecified: Secondary | ICD-10-CM | POA: Diagnosis not present

## 2021-02-14 DIAGNOSIS — S72141A Displaced intertrochanteric fracture of right femur, initial encounter for closed fracture: Secondary | ICD-10-CM | POA: Diagnosis not present

## 2021-02-14 HISTORY — PX: INTRAMEDULLARY (IM) NAIL INTERTROCHANTERIC: SHX5875

## 2021-02-14 LAB — BASIC METABOLIC PANEL
Anion gap: 8 (ref 5–15)
BUN: 16 mg/dL (ref 8–23)
CO2: 28 mmol/L (ref 22–32)
Calcium: 8.8 mg/dL — ABNORMAL LOW (ref 8.9–10.3)
Chloride: 106 mmol/L (ref 98–111)
Creatinine, Ser: 1.23 mg/dL — ABNORMAL HIGH (ref 0.44–1.00)
GFR, Estimated: 41 mL/min — ABNORMAL LOW (ref >60)
Glucose, Bld: 131 mg/dL — ABNORMAL HIGH (ref 70–99)
Potassium: 4.1 mmol/L (ref 3.5–5.1)
Sodium: 142 mmol/L (ref 135–145)

## 2021-02-14 LAB — CBC
HCT: 37.1 % (ref 36.0–46.0)
Hemoglobin: 11.7 g/dL — ABNORMAL LOW (ref 12.0–15.0)
MCH: 31.2 pg (ref 26.0–34.0)
MCHC: 31.5 g/dL (ref 30.0–36.0)
MCV: 98.9 fL (ref 80.0–100.0)
Platelets: 141 10*3/uL — ABNORMAL LOW (ref 150–400)
RBC: 3.75 MIL/uL — ABNORMAL LOW (ref 3.87–5.11)
RDW: 16.8 % — ABNORMAL HIGH (ref 11.5–15.5)
WBC: 6.4 10*3/uL (ref 4.0–10.5)
nRBC: 0 % (ref 0.0–0.2)

## 2021-02-14 SURGERY — FIXATION, FRACTURE, INTERTROCHANTERIC, WITH INTRAMEDULLARY ROD
Anesthesia: General | Laterality: Right

## 2021-02-14 MED ORDER — CEFAZOLIN SODIUM-DEXTROSE 2-4 GM/100ML-% IV SOLN
2.0000 g | Freq: Two times a day (BID) | INTRAVENOUS | Status: AC
  Administered 2021-02-14 – 2021-02-15 (×2): 2 g via INTRAVENOUS
  Filled 2021-02-14 (×2): qty 100

## 2021-02-14 MED ORDER — ADULT MULTIVITAMIN W/MINERALS CH
1.0000 | ORAL_TABLET | Freq: Every day | ORAL | Status: DC
  Administered 2021-02-14 – 2021-02-24 (×10): 1 via ORAL
  Filled 2021-02-14 (×11): qty 1

## 2021-02-14 MED ORDER — TRANEXAMIC ACID-NACL 1000-0.7 MG/100ML-% IV SOLN
1000.0000 mg | INTRAVENOUS | Status: AC
  Administered 2021-02-14: 1000 mg via INTRAVENOUS

## 2021-02-14 MED ORDER — FENTANYL CITRATE (PF) 100 MCG/2ML IJ SOLN: 25.0000 ug | INTRAMUSCULAR | Status: DC | PRN

## 2021-02-14 MED ORDER — PROPOFOL 10 MG/ML IV BOLUS
INTRAVENOUS | Status: AC
  Filled 2021-02-14: qty 20

## 2021-02-14 MED ORDER — TRANEXAMIC ACID-NACL 1000-0.7 MG/100ML-% IV SOLN
INTRAVENOUS | Status: AC
  Filled 2021-02-14: qty 100

## 2021-02-14 MED ORDER — LACTATED RINGERS IV BOLUS
250.0000 mL | Freq: Once | INTRAVENOUS | Status: AC
  Administered 2021-02-14: 250 mL via INTRAVENOUS

## 2021-02-14 MED ORDER — ORAL CARE MOUTH RINSE
15.0000 mL | Freq: Two times a day (BID) | OROMUCOSAL | Status: DC
  Administered 2021-02-14 – 2021-02-24 (×15): 15 mL via OROMUCOSAL

## 2021-02-14 MED ORDER — ROCURONIUM BROMIDE 10 MG/ML (PF) SYRINGE
PREFILLED_SYRINGE | INTRAVENOUS | Status: DC | PRN
  Administered 2021-02-14: 50 mg via INTRAVENOUS

## 2021-02-14 MED ORDER — ENSURE ENLIVE PO LIQD
237.0000 mL | Freq: Two times a day (BID) | ORAL | Status: DC
  Administered 2021-02-15 – 2021-02-24 (×17): 237 mL via ORAL

## 2021-02-14 MED ORDER — ONDANSETRON HCL 4 MG/2ML IJ SOLN
INTRAMUSCULAR | Status: DC | PRN
Start: 2021-02-14 — End: 2021-02-14
  Administered 2021-02-14: 4 mg via INTRAVENOUS

## 2021-02-14 MED ORDER — ACETAMINOPHEN 500 MG PO TABS: 1000.0000 mg | ORAL_TABLET | Freq: Once | ORAL | Status: DC | PRN

## 2021-02-14 MED ORDER — LIDOCAINE 2% (20 MG/ML) 5 ML SYRINGE
INTRAMUSCULAR | Status: DC | PRN
  Administered 2021-02-14: 40 mg via INTRAVENOUS

## 2021-02-14 MED ORDER — LACTATED RINGERS IV SOLN: INTRAVENOUS | Status: DC | PRN

## 2021-02-14 MED ORDER — CEFAZOLIN SODIUM-DEXTROSE 2-4 GM/100ML-% IV SOLN
2.0000 g | INTRAVENOUS | Status: AC
  Administered 2021-02-14: 2 g via INTRAVENOUS

## 2021-02-14 MED ORDER — FENTANYL CITRATE (PF) 250 MCG/5ML IJ SOLN
INTRAMUSCULAR | Status: AC
  Filled 2021-02-14: qty 5

## 2021-02-14 MED ORDER — VANCOMYCIN HCL 1000 MG IV SOLR
INTRAVENOUS | Status: AC
  Filled 2021-02-14: qty 20

## 2021-02-14 MED ORDER — CEFAZOLIN SODIUM-DEXTROSE 2-4 GM/100ML-% IV SOLN: 2.0000 g | Freq: Three times a day (TID) | INTRAVENOUS | Status: DC

## 2021-02-14 MED ORDER — OXYCODONE HCL 5 MG/5ML PO SOLN: 5.0000 mg | Freq: Once | ORAL | Status: DC | PRN

## 2021-02-14 MED ORDER — 0.9 % SODIUM CHLORIDE (POUR BTL) OPTIME
TOPICAL | Status: DC | PRN
  Administered 2021-02-14: 1000 mL

## 2021-02-14 MED ORDER — CHLORHEXIDINE GLUCONATE CLOTH 2 % EX PADS
6.0000 | MEDICATED_PAD | Freq: Every day | CUTANEOUS | Status: DC
  Administered 2021-02-14 – 2021-02-21 (×7): 6 via TOPICAL

## 2021-02-14 MED ORDER — DILTIAZEM HCL 25 MG/5ML IV SOLN
10.0000 mg | Freq: Once | INTRAVENOUS | Status: AC
  Administered 2021-02-14: 10 mg via INTRAVENOUS
  Filled 2021-02-14: qty 5

## 2021-02-14 MED ORDER — PHENYLEPHRINE HCL-NACL 20-0.9 MG/250ML-% IV SOLN
INTRAVENOUS | Status: DC | PRN
  Administered 2021-02-14: 20 ug/min via INTRAVENOUS

## 2021-02-14 MED ORDER — PROPOFOL 10 MG/ML IV BOLUS
INTRAVENOUS | Status: DC | PRN
Start: 2021-02-14 — End: 2021-02-14
  Administered 2021-02-14: 60 mg via INTRAVENOUS

## 2021-02-14 MED ORDER — PHENYLEPHRINE 40 MCG/ML (10ML) SYRINGE FOR IV PUSH (FOR BLOOD PRESSURE SUPPORT)
PREFILLED_SYRINGE | INTRAVENOUS | Status: DC | PRN
  Administered 2021-02-14: 80 ug via INTRAVENOUS
  Administered 2021-02-14: 120 ug via INTRAVENOUS
  Administered 2021-02-14 (×2): 80 ug via INTRAVENOUS

## 2021-02-14 MED ORDER — FENTANYL CITRATE (PF) 250 MCG/5ML IJ SOLN
INTRAMUSCULAR | Status: DC | PRN
  Administered 2021-02-14: 75 ug via INTRAVENOUS
  Administered 2021-02-14: 25 ug via INTRAVENOUS

## 2021-02-14 MED ORDER — OXYCODONE HCL 5 MG PO TABS: 5.0000 mg | ORAL_TABLET | Freq: Once | ORAL | Status: DC | PRN

## 2021-02-14 MED ORDER — SUGAMMADEX SODIUM 200 MG/2ML IV SOLN
INTRAVENOUS | Status: DC | PRN
  Administered 2021-02-14: 150 mg via INTRAVENOUS

## 2021-02-14 MED ORDER — ACETAMINOPHEN 160 MG/5ML PO SOLN: 1000.0000 mg | Freq: Once | ORAL | Status: DC | PRN

## 2021-02-14 MED ORDER — VANCOMYCIN HCL 1000 MG IV SOLR
INTRAVENOUS | Status: DC | PRN
  Administered 2021-02-14: 1000 mg

## 2021-02-14 MED ORDER — ACETAMINOPHEN 10 MG/ML IV SOLN: 1000.0000 mg | Freq: Once | INTRAVENOUS | Status: DC | PRN

## 2021-02-14 MED ORDER — CEFAZOLIN SODIUM-DEXTROSE 2-4 GM/100ML-% IV SOLN
INTRAVENOUS | Status: AC
  Filled 2021-02-14: qty 100

## 2021-02-14 MED ORDER — METHOCARBAMOL 500 MG PO TABS
500.0000 mg | ORAL_TABLET | Freq: Once | ORAL | Status: AC
  Filled 2021-02-14: qty 1

## 2021-02-14 SURGICAL SUPPLY — 45 items
ALCOHOL 70% 16 OZ (MISCELLANEOUS) ×2 IMPLANT
BAG COUNTER SPONGE SURGICOUNT (BAG) ×2 IMPLANT
BIT DRILL CALIBRATED 4.3X320MM (BIT) IMPLANT
BNDG COHESIVE 6X5 TAN STRL LF (GAUZE/BANDAGES/DRESSINGS) ×4 IMPLANT
CANISTER SUCT 3000ML PPV (MISCELLANEOUS) ×2 IMPLANT
CORTICAL BONE SCR 5.0MM X 48MM (Screw) ×2 IMPLANT
COVER PERINEAL POST (MISCELLANEOUS) ×2 IMPLANT
COVER SURGICAL LIGHT HANDLE (MISCELLANEOUS) ×2 IMPLANT
DRAPE C-ARM 42X72 X-RAY (DRAPES) ×2 IMPLANT
DRAPE HALF SHEET 40X57 (DRAPES) IMPLANT
DRAPE INCISE IOBAN 66X45 STRL (DRAPES) ×2 IMPLANT
DRAPE STERI IOBAN 125X83 (DRAPES) ×2 IMPLANT
DRESSING MEPILEX FLEX 4X4 (GAUZE/BANDAGES/DRESSINGS) IMPLANT
DRILL CALIBRATED 4.3X320MM (BIT) ×2
DRSG ADAPTIC 3X8 NADH LF (GAUZE/BANDAGES/DRESSINGS) ×2 IMPLANT
DRSG MEPILEX FLEX 4X4 (GAUZE/BANDAGES/DRESSINGS) ×6
DURAPREP 26ML APPLICATOR (WOUND CARE) ×2 IMPLANT
ELECT CAUTERY BLADE 6.4 (BLADE) ×2 IMPLANT
ELECT REM PT RETURN 9FT ADLT (ELECTROSURGICAL) ×2
ELECTRODE REM PT RTRN 9FT ADLT (ELECTROSURGICAL) ×1 IMPLANT
GAUZE SPONGE 4X4 12PLY STRL LF (GAUZE/BANDAGES/DRESSINGS) ×2 IMPLANT
GLOVE SRG 8 PF TXTR STRL LF DI (GLOVE) ×2 IMPLANT
GLOVE SURG ENC MOIS LTX SZ7.5 (GLOVE) ×4 IMPLANT
GLOVE SURG UNDER POLY LF SZ8 (GLOVE) ×2
GOWN STRL REUS W/ TWL LRG LVL3 (GOWN DISPOSABLE) ×1 IMPLANT
GOWN STRL REUS W/ TWL XL LVL3 (GOWN DISPOSABLE) ×2 IMPLANT
GOWN STRL REUS W/TWL LRG LVL3 (GOWN DISPOSABLE) ×1
GOWN STRL REUS W/TWL XL LVL3 (GOWN DISPOSABLE) ×2
GUIDEPIN 3.2X17.5 THRD DISP (PIN) ×1 IMPLANT
GUIDEWIRE 3.0X100MM BALL TIP (WIRE) ×1 IMPLANT
HIP FRAC NAIL LAG SCR 10.5X100 (Orthopedic Implant) ×1 IMPLANT
KIT BASIN OR (CUSTOM PROCEDURE TRAY) ×2 IMPLANT
KIT TURNOVER KIT B (KITS) ×2 IMPLANT
NAIL AFFIXUS RT 11X340MM (Nail) ×1 IMPLANT
NS IRRIG 1000ML POUR BTL (IV SOLUTION) ×2 IMPLANT
PACK GENERAL/GYN (CUSTOM PROCEDURE TRAY) ×2 IMPLANT
PAD ARMBOARD 7.5X6 YLW CONV (MISCELLANEOUS) ×4 IMPLANT
SCREW CANN THRD AFF 10.5X100 (Orthopedic Implant) IMPLANT
SCREW CORTICL BON 5.0MM X 48MM (Screw) IMPLANT
SCREWDRIVER HEX TIP 3.5MM (MISCELLANEOUS) ×1 IMPLANT
STAPLER VISISTAT 35W (STAPLE) ×2 IMPLANT
SUT MON AB 2-0 CT1 36 (SUTURE) ×2 IMPLANT
TOWEL GREEN STERILE (TOWEL DISPOSABLE) ×2 IMPLANT
TOWEL GREEN STERILE FF (TOWEL DISPOSABLE) ×2 IMPLANT
WATER STERILE IRR 1000ML POUR (IV SOLUTION) ×2 IMPLANT

## 2021-02-14 NOTE — Anesthesia Preprocedure Evaluation (Signed)
Anesthesia Evaluation  Patient identified by MRN, date of birth, ID band Patient confused    Reviewed: Allergy & Precautions, NPO status , Patient's Chart, lab work & pertinent test results  History of Anesthesia Complications Negative for: history of anesthetic complications  Airway Mallampati: III  TM Distance: <3 FB Neck ROM: Full    Dental  (+) Dental Advisory Given, Teeth Intact   Pulmonary neg pulmonary ROS,    breath sounds clear to auscultation       Cardiovascular hypertension, Pt. on medications + Past MI  + dysrhythmias Atrial Fibrillation  Rhythm:Irregular Rate:Tachycardia     Neuro/Psych PSYCHIATRIC DISORDERS Dementia negative neurological ROS     GI/Hepatic negative GI ROS, Neg liver ROS,   Endo/Other    Renal/GU Renal diseaseLab Results      Component                Value               Date                      WBC                      6.4                 02/14/2021                HGB                      11.7 (L)            02/14/2021                HCT                      37.1                02/14/2021                MCV                      98.9                02/14/2021                PLT                      141 (L)             02/14/2021                Musculoskeletal  (+) Arthritis , Interoch Hip Right   Abdominal   Peds  Hematology  (+) Blood dyscrasia, anemia , Lab Results      Component                Value               Date                      WBC                      6.4                 02/14/2021                HGB  11.7 (L)            02/14/2021                HCT                      37.1                02/14/2021                MCV                      98.9                02/14/2021                PLT                      141 (L)             02/14/2021            eliquis last dose 12/31   Anesthesia Other Findings   Reproductive/Obstetrics                              Anesthesia Physical Anesthesia Plan  ASA: 3  Anesthesia Plan: General   Post-op Pain Management:    Induction: Intravenous  PONV Risk Score and Plan: 3 and Ondansetron and Dexamethasone  Airway Management Planned: Oral ETT  Additional Equipment: None  Intra-op Plan:   Post-operative Plan: Extubation in OR  Informed Consent: I have reviewed the patients History and Physical, chart, labs and discussed the procedure including the risks, benefits and alternatives for the proposed anesthesia with the patient or authorized representative who has indicated his/her understanding and acceptance.     Dental advisory given  Plan Discussed with: CRNA and Anesthesiologist  Anesthesia Plan Comments:         Anesthesia Quick Evaluation

## 2021-02-14 NOTE — Interval H&P Note (Signed)
History and Physical Interval Note:  02/14/2021 9:12 AM  Kathy Massey  has presented today for surgery, with the diagnosis of Interoch Hip Right.  The various methods of treatment have been discussed with the patient and family. After consideration of risks, benefits and other options for treatment, the patient has consented to  Procedure(s): INTRAMEDULLARY (IM) NAIL INTERTROCHANTRIC (Right) as a surgical intervention.  The patient's history has been reviewed, patient examined, no change in status, stable for surgery.  I have reviewed the patient's chart and labs.  Questions were answered to the patient's satisfaction.     Vanetta Mulders

## 2021-02-14 NOTE — Progress Notes (Signed)
PROGRESS NOTE    TIAWANNA Massey  XBD:[id number] DOB: May 07, 1927 DOA: 02/13/2021 PCP: [name], MD   Brief Narrative:  The patient is a elderly 88 year old Caucasian female past medical history significant for but not limited to atrial fibrillation on anticoagulation with apixaban, CAD, hypertension, history of dementia as well as history of lymphoma presenting after a fall.  Patient has advanced dementia and she lives in a memory care unit and she was unable to provide a subjective history.  Patient's children are at bedside and provides all the history.  She had behavioral disturbances started around 6 PM and became quite agitated on low-dose Lexapro 2.5 mg daily.  Her primary complaint was dizziness and patient stated that she may have been on too many medications and subsequently she fell a few months ago and broke her left clavicle.  She usually walks unassisted and yesterday she was found on the floor of her room around 5:30 AM.  She is only alert to person and had no idea what happened.  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 after midnight last night and went for surgical intervention  today -SCDs overnight, and orthopedic surgery recommending resuming apixaban tomorrow -Pain control with Tylenol, Robaxin, Oxycodone, and Morphine prn -TOC team consult for rehab placement -Will need PT consult post-operatively -Hip fracture order set utilized -TXA per orthopedics -Fascia iliacus block ordered per anesthesia -Per Ortho she is to be weightbearing as tolerated on the right leg and she will refuse physical therapy on day 1.  They are going to follow-up with the patient in the outpatient setting in 2 weeks for staple removal -Per Ortho resume apixaban tomorrow   Pre-operative stratification -Orthopedic/spinal surgery is associated with an intermediate (1-5%) cardiovascular risk for cardiac death and nonfatal MI -Her revised cardiac index gives a risk estimate of 3.9% -Her Detsky's Modified Cardiac Risk Index score is 15, with a low cardiac risk -It is reasonable for her to go to the OR without additional evaluation   Hyperglycemia- (present on admission) -Mild hyperglycemia without known h/o DM -She is unlikely to suffer long-term effects of uncontrolled DM regardless -Would not monitor and recommend surveillance only with significantly elevated glucose -CBG yesterday was 121 and this morning it is 131   Goals of care  -DNR (do not resuscitate)- (present on admission) -I have discussed code status with the patient's son and daughter and they are in agreement that the patient would not desire resuscitation and would prefer to die a natural death should that situation arise. -She will need a gold out of facility DNR form at the time of discharge -They also prefer to minimize medications on an ongoing basis if possible   Mixed Alzheimer's and  vascular dementia ([place])- (present on admission) -Family describes significant sundowning (they refer to it as "anxiety" but describe behavioral disturbance -She was started on low-dose Lexapro (2.5 mg) without obvious improvement -Will  stop Lexapro and add Seroquel nightly at 6 pm and will continue Seroquel -Will hold Namenda and consider stopping this, as well; she has advanced disease and is unlikely to have benefit from ongoing therapy -Delirium precautions -Daughter will plan to stay t tonight but is available sooner if needed -Telesitter   Persistent atrial fibrillation ([place])- (present on admission) -Rate controlled with Diltiazem but she is late for dose so continues to have tachycardia in the 120s -Will monitor on telemetry -She has been on Eliquis but this is on hold and per Ortho recommending resuming in the morning -Consider fall risk when deciding whether to resume   Stage 3a chronic kidney disease (CKD) ([place])- (present on admission) -Appears to be stable at this time -Patient's BUNs/creatinine went from 16/1.13 and is now 16/1.23 -Avoid further nephrotoxic medications, contrast dyes, hypotension renally dose medications   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  -She was treated with acalabrutinib for recurrent disease, stopped due to toxicity -No obvious evidence of recurrence -Followed by Dr. Marin Olp -Last seen in Dec, plan for spring f/u  Normocytic anemia/anemia of chronic disease -Patient's hemoglobin/hematocrit went from 11.6/36.2 is now 11.7/37.1 -Check anemia panel in a.m. -Expect to drop postoperatively little bit further -Continue to monitor for signs and symptoms of bleeding; currently no overt bleeding noted -Repeat CBC in the a.m.  Thrombocytopenia -Platelet count went from 156 is now 141 -Continue to monitor for signs and symptoms of bleeding; no overt bleeding noted -Repeat CBC in the a.m.  DVT prophylaxis: SCDs and will resume apixaban in the morning per Ortho  recommendation Code Status: DO NOT RESUSCITATE Family Communication: Family at the bedside when she was down in the OR Disposition Plan: Pending PT OT evaluations  Status is: Inpatient  Remains inpatient appropriate because: She will need PT OT to further evaluate and orthopedic surgery clearance prior to safe discharge disposition  Consultants:  Orthopedic surgery  Procedures:  Right hip cephalomedullary nail  Antimicrobials:  Anti-infectives (From admission, onward)    Start     Dose/Rate Route Frequency Ordered Stop   02/14/21 1800  ceFAZolin (ANCEF) IVPB 2g/100 mL premix        2 g 200 mL/hr over 30 Minutes Intravenous Every 12 hours 02/14/21 1324 02/15/21 1759   02/14/21 1400  ceFAZolin (ANCEF) IVPB 2g/100 mL premix  Status:  Discontinued        2 g 200 mL/hr over 30 Minutes Intravenous Every 8 hours 02/14/21 1306 02/14/21 1322   02/14/21 1104  vancomycin (VANCOCIN) powder  Status:  Discontinued          As needed 02/14/21 1104 02/14/21 1123   02/14/21 0915  ceFAZolin (ANCEF) IVPB 2g/100 mL premix        2 g 200 mL/hr over 30 Minutes Intravenous On call to O.R. 02/14/21 0902 02/14/21 1031   02/14/21 0906  ceFAZolin (ANCEF) 2-4 GM/100ML-% IVPB       Note to Pharmacy: Gustavo Lah J: cabinet override      02/14/21 0906 02/14/21 1052        Subjective: Seen and examined in the PACU and she was complaining of the ice being  too cold on her hip.  Denies any pain but she is pleasantly demented.  She denies any nausea or vomiting.  No other concerns or complaints this time.  Objective: Vitals:   02/14/21 1302 02/14/21 1328 02/14/21 1628 02/14/21 1631  BP: 106/87  93/71   Pulse: 97  (!) 52 (!) 102  Resp: 18  18   Temp: 97.7 F (36.5 C)  (!) 97.2 F (36.2 C)   TempSrc: Oral  Oral   SpO2: 93% 90% 92% 92%  Weight:      Height:        Intake/Output Summary (Last 24 hours) at 02/14/2021 1757 Last data filed at 02/14/2021 1240 Gross per 24 hour  Intake 900 ml  Output 1030  ml  Net -130 ml   Filed Weights   02/13/21 0855 02/14/21 0911  Weight: 49.9 kg 49.9 kg   Examination: Physical Exam:  Constitutional: Thin frail chronically ill-appearing Caucasian female currently in no acute distress Eyes: Lids and conjunctivae normal, sclerae anicteric  ENMT: External Ears, Nose appear normal. Grossly normal hearing.  Neck: Appears normal, supple, no cervical masses, normal ROM, no appreciable thyromegaly: No appreciable JVD Respiratory: Diminished to auscultation bilaterally, no wheezing, rales, rhonchi or crackles. Normal respiratory effort and patient is not tachypenic. No accessory muscle use.  Cardiovascular: Irregularly irregular and slightly tachycardic, no murmurs / rubs / gallops. S1 and S2 auscultated.  No appreciable lower extremity edema Abdomen: Soft, non-tender, non-distended. Bowel sounds positive.  GU: Deferred. Musculoskeletal: No clubbing / cyanosis of digits/nails. No joint deformity upper and lower extremities.   Skin: No rashes, lesions, ulcers on a limited skin evaluation. No induration; Warm and dry.  Neurologic: CN 2-12 grossly intact with no focal deficits. Romberg sign and cerebellar reflexes not assessed.  Psychiatric: Impaired judgment and insight. Alert and oriented x 1. Normal mood and appropriate affect.   Data Reviewed: I have personally reviewed following labs and imaging studies  CBC: Recent Labs  Lab 02/13/21 0716 02/14/21 0309  WBC 8.6 6.4  HGB 11.6* 11.7*  HCT 36.2 37.1  MCV 98.4 98.9  PLT 156 008*   Basic Metabolic Panel: Recent Labs  Lab 02/13/21 0716 02/14/21 0309  NA 140 142  K 3.7 4.1  CL 108 106  CO2 23 28  GLUCOSE 121* 131*  BUN 16 16  CREATININE 1.13* 1.23*  CALCIUM 8.7* 8.8*   GFR: Estimated Creatinine Clearance: 20.3 mL/min (A) (by C-G formula based on SCr of 1.23 mg/dL (H)). Liver Function Tests: No results for input(s): AST, ALT, ALKPHOS, BILITOT, PROT, ALBUMIN in the last 168 hours. No results  for input(s): LIPASE, AMYLASE in the last 168 hours. No results for input(s): AMMONIA in the last 168 hours. Coagulation Profile: No results for input(s): INR, PROTIME in the last 168 hours. Cardiac Enzymes: No results for input(s): CKTOTAL, CKMB, CKMBINDEX, TROPONINI in the last 168 hours. BNP (last 3 results) No results for input(s): PROBNP in the last 8760 hours. HbA1C: No results for input(s): HGBA1C in the last 72 hours. CBG: No results for input(s): GLUCAP in the last 168 hours. Lipid Profile: No results for input(s): CHOL, HDL, LDLCALC, TRIG, CHOLHDL, LDLDIRECT in the last 72 hours. Thyroid Function Tests: No results for input(s): TSH, T4TOTAL, FREET4, T3FREE, THYROIDAB in the last 72 hours. Anemia Panel: No results for input(s): VITAMINB12, FOLATE, FERRITIN, TIBC, IRON, RETICCTPCT in the last 72 hours. Sepsis Labs: No results for input(s): PROCALCITON, LATICACIDVEN in the last 168 hours.  Recent Results (from the  past 240 hour(s))  Resp Panel by RT-PCR (Flu A&B, Covid) Nasopharyngeal Swab     Status: None   Collection Time: 02/13/21 11:44 AM   Specimen: Nasopharyngeal Swab; Nasopharyngeal(NP) swabs in vial transport medium  Result Value Ref Range Status   SARS Coronavirus 2 by RT PCR NEGATIVE NEGATIVE Final    Comment: (NOTE) SARS-CoV-2 target nucleic acids are NOT DETECTED.  The SARS-CoV-2 RNA is generally detectable in upper respiratory specimens during the acute phase of infection. The lowest concentration of SARS-CoV-2 viral copies this assay can detect is 138 copies/mL. A negative result does not preclude SARS-Cov-2 infection and should not be used as the sole basis for treatment or other patient management decisions. A negative result may occur with  improper specimen collection/handling, submission of specimen other than nasopharyngeal swab, presence of viral mutation(s) within the areas targeted by this assay, and inadequate number of viral copies(<138  copies/mL). A negative result must be combined with clinical observations, patient history, and epidemiological information. The expected result is Negative.  Fact Sheet for Patients:  EntrepreneurPulse.com.au  Fact Sheet for Healthcare Providers:  IncredibleEmployment.be  This test is no t yet approved or cleared by the [place] FDA and  has been authorized for detection and/or diagnosis of SARS-CoV-2 by FDA under an Emergency Use Authorization (EUA). This EUA will remain  in effect (meaning this test can be used) for the duration of the COVID-19 declaration under Section 564(b)(1) of the Act, 21 U.S.C.section 360bbb-3(b)(1), unless the authorization is terminated  or revoked sooner.       Influenza A by PCR NEGATIVE NEGATIVE Final   Influenza B by PCR NEGATIVE NEGATIVE Final    Comment: (NOTE) The Xpert Xpress SARS-CoV-2/FLU/RSV plus assay is intended as an aid in the diagnosis of influenza from Nasopharyngeal swab specimens and should not be used as a sole basis for treatment. Nasal washings and aspirates are unacceptable for Xpert Xpress SARS-CoV-2/FLU/RSV testing.  Fact Sheet for Patients: EntrepreneurPulse.com.au  Fact Sheet for Healthcare Providers: IncredibleEmployment.be  This test is not yet approved or cleared by the [place] FDA and has been authorized for detection and/or diagnosis of SARS-CoV-2 by FDA under an Emergency Use Authorization (EUA). This EUA will remain in effect (meaning this test can be used) for the duration of the COVID-19 declaration under Section 564(b)(1) of the Act, 21 U.S.C. section 360bbb-3(b)(1), unless the authorization is terminated or revoked.  Performed at [facility], [place] [address], [place], [place] [address]   Surgical pcr screen     Status: None   Collection Time: 02/13/21  8:24 PM   Specimen: Nasal Mucosa; Nasal Swab  Result Value Ref  Range Status   MRSA, PCR NEGATIVE NEGATIVE Final   Staphylococcus aureus NEGATIVE NEGATIVE Final    Comment: (NOTE) The Xpert SA Assay (FDA approved for NASAL specimens in patients 30 years of age and older), is one component of a comprehensive surveillance program. It is not intended to diagnose infection nor to guide or monitor treatment. Performed at [facility], [place] [address], [place], [place] [address]     RN Pressure Injury Documentation:     Estimated body mass index is 22.22 kg/m as calculated from the following:   Height as of this encounter: 4\' 11"  (1.499 m).   Weight as of this encounter: 49.9 kg.  Malnutrition Type:  Nutrition Problem: Increased nutrient needs Etiology: hip fracture  Malnutrition Characteristics:  Signs/Symptoms: estimated needs  Nutrition Interventions:  Interventions: Ensure Enlive (  each supplement provides 350kcal and 20 grams of protein), MVI   Radiology Studies: CT Head Wo Contrast  Result Date: 02/13/2021 CLINICAL DATA:  Un witnessed fall. Headache. Patient is on blood thinners. EXAM: CT HEAD WITHOUT CONTRAST CT CERVICAL SPINE WITHOUT CONTRAST TECHNIQUE: Multidetector CT imaging of the head and cervical spine was performed following the standard protocol without intravenous contrast. Multiplanar CT image reconstructions of the cervical spine were also generated. COMPARISON:  None. FINDINGS: CT HEAD FINDINGS Brain: No evidence of acute infarction, hemorrhage, hydrocephalus, extra-axial collection or mass lesion/mass effect. Ventricular and sulcal enlargement consistent with moderate atrophy. Patchy areas of white matter hypoattenuation are also noted consistent with mild chronic microvascular ischemic change. Vascular: No hyperdense vessel or unexpected calcification. Skull: Normal. Negative for fracture or focal lesion. Sinuses/Orbits: Globes and orbits are unremarkable. Sinuses are clear. Other: None. CT CERVICAL SPINE FINDINGS Alignment:  Normal. Skull base and vertebrae: No acute fracture. No primary bone lesion or focal pathologic process. Soft tissues and spinal canal: No prevertebral fluid or swelling. No visible canal hematoma. Disc levels: Moderate loss of disc height at C3-C4 and C4-C5 and C6-C7. Mild disc bulging and endplate spurring noted at these levels. No convincing disc herniation. Upper chest: Multiple bone fragments adjacent to the distal left clavicle suspected to be an old fracture, but possibly acute/recent. Other: None. IMPRESSION: HEAD CT 1. No acute intracranial abnormalities. CERVICAL CT 1. No spine fracture or malalignment. 2. Distal left clavicle fractures, incompletely imaged and possibly chronic. Clinical correlation recommended. Electronically Signed   By: Lajean Manes M.D.   On: 02/13/2021 09:03   CT Cervical Spine Wo Contrast  Result Date: 02/13/2021 CLINICAL DATA:  Un witnessed fall. Headache. Patient is on blood thinners. EXAM: CT HEAD WITHOUT CONTRAST CT CERVICAL SPINE WITHOUT CONTRAST TECHNIQUE: Multidetector CT imaging of the head and cervical spine was performed following the standard protocol without intravenous contrast. Multiplanar CT image reconstructions of the cervical spine were also generated. COMPARISON:  None. FINDINGS: CT HEAD FINDINGS Brain: No evidence of acute infarction, hemorrhage, hydrocephalus, extra-axial collection or mass lesion/mass effect. Ventricular and sulcal enlargement consistent with moderate atrophy. Patchy areas of white matter hypoattenuation are also noted consistent with mild chronic microvascular ischemic change. Vascular: No hyperdense vessel or unexpected calcification. Skull: Normal. Negative for fracture or focal lesion. Sinuses/Orbits: Globes and orbits are unremarkable. Sinuses are clear. Other: None. CT CERVICAL SPINE FINDINGS Alignment: Normal. Skull base and vertebrae: No acute fracture. No primary bone lesion or focal pathologic process. Soft tissues and spinal  canal: No prevertebral fluid or swelling. No visible canal hematoma. Disc levels: Moderate loss of disc height at C3-C4 and C4-C5 and C6-C7. Mild disc bulging and endplate spurring noted at these levels. No convincing disc herniation. Upper chest: Multiple bone fragments adjacent to the distal left clavicle suspected to be an old fracture, but possibly acute/recent. Other: None. IMPRESSION: HEAD CT 1. No acute intracranial abnormalities. CERVICAL CT 1. No spine fracture or malalignment. 2. Distal left clavicle fractures, incompletely imaged and possibly chronic. Clinical correlation recommended. Electronically Signed   By: Lajean Manes M.D.   On: 02/13/2021 09:03   DG Shoulder Left  Result Date: 02/13/2021 CLINICAL DATA:  Status post fall with left shoulder pain. EXAM: LEFT SHOULDER - 2+ VIEW COMPARISON:  December 29, 2020 FINDINGS: Chronic change of the left clavicle is noted. There is no acute fracture or dislocation. The visualized lung field is normal. IMPRESSION: No acute fracture or dislocation. Electronically Signed   By: Seward Meth  Augustin Coupe M.D.   On: 02/13/2021 08:29   DG C-Arm 1-60 Min-No Report  Result Date: 02/14/2021 Fluoroscopy was utilized by the requesting physician.  No radiographic interpretation.   DG C-Arm 1-60 Min-No Report  Result Date: 02/14/2021 Fluoroscopy was utilized by the requesting physician.  No radiographic interpretation.   DG Hip Unilat W or Wo Pelvis 2-3 Views Left  Result Date: 02/13/2021 CLINICAL DATA:  Status post fall with right hip pain. EXAM: DG HIP (WITH OR WITHOUT PELVIS) 2-3V LEFT COMPARISON:  None. FINDINGS: There is no evidence of hip fracture or dislocation. Degenerative joint changes of left hip are noted. IMPRESSION: No acute fracture or dislocation of left hip. Electronically Signed   By: Abelardo Diesel M.D.   On: 02/13/2021 08:28   DG Hip Unilat W or Wo Pelvis 2-3 Views Right  Result Date: 02/13/2021 CLINICAL DATA:  Status post fall with right hip pain.  EXAM: DG HIP (WITH OR WITHOUT PELVIS) 2-3V RIGHT COMPARISON:  None. FINDINGS: Displaced fracture of the right intertrochanteric region extending to the proximal right femoral shaft is identified. Degenerative joint changes of the left hip and. IMPRESSION: Displaced fracture of right intertrochanteric region extending to the proximal right femoral shaft. Electronically Signed   By: Abelardo Diesel M.D.   On: 02/13/2021 08:27   DG FEMUR, MIN 2 VIEWS RIGHT  Result Date: 02/14/2021 CLINICAL DATA:  Intramedullary rod fixation of right femur fracture. EXAM: RIGHT FEMUR 2 VIEWS; DG C-ARM 1-60 MIN-NO REPORT Radiation exposure index: 7.05 mGy. COMPARISON:  February 13, 2021. FINDINGS: Six intraoperative fluoroscopic images were obtained of the right femur. These images demonstrate surgical internal fixation of intertrochanteric fracture involving proximal right femur. Intramedullary rod fixation of right femur is noted as well. IMPRESSION: Fluoroscopic guidance provided during surgical internal fixation of proximal right femoral intertrochanteric fracture. Electronically Signed   By: Marijo Conception M.D.   On: 02/14/2021 12:26    Scheduled Meds:  Chlorhexidine Gluconate Cloth  6 each Topical Daily   diltiazem  60 mg Oral Daily   docusate sodium  100 mg Oral BID   [START ON 02/15/2021] feeding supplement  237 mL Oral BID BM   mouth rinse  15 mL Mouth Rinse BID   multivitamin with minerals  1 tablet Oral Daily   QUEtiapine  25 mg Oral QHS   Continuous Infusions:   ceFAZolin (ANCEF) IV     methocarbamol (ROBAXIN) IV      LOS: 1 day   Kerney Elbe, DO [facility] PAGER is on AMION  If 7PM-7AM, please contact night-coverage [email or web address]

## 2021-02-14 NOTE — Progress Notes (Signed)
Pt unable to urinate. Bladder scan showed 971ml. Made provider on call aware. Insertion of foley has been ordered.

## 2021-02-14 NOTE — Anesthesia Postprocedure Evaluation (Signed)
Anesthesia Post Note  Patient: Kathy Massey  Procedure(s) Performed: INTRAMEDULLARY (IM) NAIL INTERTROCHANTRIC (Right)     Patient location during evaluation: PACU Anesthesia Type: General Level of consciousness: patient cooperative and awake Pain management: pain level controlled Vital Signs Assessment: post-procedure vital signs reviewed and stable Respiratory status: spontaneous breathing, nonlabored ventilation, respiratory function stable and patient connected to nasal cannula oxygen Cardiovascular status: blood pressure returned to baseline and stable Postop Assessment: no apparent nausea or vomiting Anesthetic complications: no   No notable events documented.  Last Vitals:  Vitals:   02/14/21 1302 02/14/21 1328  BP: 106/87   Pulse: 97   Resp: 18   Temp: 36.5 C   SpO2: 93% 90%    Last Pain:  Vitals:   02/14/21 1313  TempSrc:   PainSc: 10-Worst pain ever                 Mahaley Schwering

## 2021-02-14 NOTE — Transfer of Care (Signed)
Immediate Anesthesia Transfer of Care Note  Patient: Kathy Massey  Procedure(s) Performed: INTRAMEDULLARY (IM) NAIL INTERTROCHANTRIC (Right)  Patient Location: PACU  Anesthesia Type:General  Level of Consciousness: drowsy  Airway & Oxygen Therapy: Patient Spontanous Breathing and Patient connected to nasal cannula oxygen  Post-op Assessment: Report given to RN, Post -op Vital signs reviewed and stable and Patient moving all extremities  Post vital signs: Reviewed and stable  Last Vitals:  Vitals Value Taken Time  BP 134/120 02/14/21 1126  Temp 36.8 C 02/14/21 1126  Pulse 111 02/14/21 1136  Resp 26 02/14/21 1136  SpO2 95 % 02/14/21 1136  Vitals shown include unvalidated device data.  Last Pain:  Vitals:   02/14/21 1126  TempSrc:   PainSc: Asleep         Complications: No notable events documented.

## 2021-02-14 NOTE — Discharge Instructions (Signed)
° ° °   Discharge Instructions    Attending Surgeon: Vanetta Mulders, MD Office Phone Number: [phone]   Diagnosis and Procedures:    Surgeries Performed: Right hip intramedullary nail  Discharge Plan:    Diet: Resume usual diet. Begin with light or bland foods.  Drink plenty of fluids.  Activity:  Weight bearing as tolerated.  ever or chills. Be alert for excessive pain or bleeding and notify your surgeon immediately.  WOUND INSTRUCTIONS:   Leave your dressing jin place until your post operative visit.  Keep it clean and dry.  Always keep the incision clean and dry until the staples/sutures are removed. If there is no drainage from the incision you should keep it open to air. If there is drainage from the incision you must keep it covered at all times until the drainage stops  Do not soak in a bath tub, hot tub, pool, lake or other body of water until 21 days after your surgery and your incision is completely dry and healed.  If you have removable sutures (or staples) they must be removed 10-14 days (unless otherwise instructed) from the day of your surgery.     1)  Elevate the extremity as much as possible.  2)  Keep the dressing clean and dry.  3)  Please call us if the dressing becomes wet or dirty.  4)  If you are experiencing worsening pain or worsening swelling, please call.

## 2021-02-14 NOTE — Brief Op Note (Signed)
° °  Brief Op Note  Date of Surgery: 02/14/2021  Preoperative Diagnosis: Interoch Hip Right  Postoperative Diagnosis: same  Procedure: Procedure(s): INTRAMEDULLARY (IM) NAIL INTERTROCHANTRIC  Implants: Implant Name Type Inv. Item Serial No. Manufacturer Lot No. LRB No. Used Action  NAIL AFFIXUS RT 11X340MM - [id number] Nail NAIL AFFIXUS RT 11X340MM  [employer](ORTH,TRAU,BIO,SG) [id number] Right 1 Implanted  HIP FRAC NAIL LAG SCR 10.5X100 - [id number] Orthopedic Implant HIP FRAC NAIL LAG SCR 10.5X100  [employer](ORTH,TRAU,BIO,SG) [id number] Right 1 Implanted  CORTICAL BONE SCR 5.0MM X 48MM - [id number] Screw CORTICAL BONE SCR 5.0MM X 48MM  [employer](ORTH,TRAU,BIO,SG) [id number] Right 1 Implanted    Surgeons: Surgeon(s): Vanetta Mulders, MD  Anesthesia: General    Estimated Blood Loss: See anesthesia record  Complications: None  Condition to PACU: Stable  Yevonne Pax, MD 02/14/2021 11:15 AM

## 2021-02-14 NOTE — Progress Notes (Signed)
Initial Nutrition Assessment  DOCUMENTATION CODES:   Not applicable  INTERVENTION:  - Encourage PO intake - Ensure Enlive po BID, each supplement provides 350 kcal and 20 grams of protein - Magic cup TID with meals, each supplement provides 290 kcal and 9 grams of protein - MVI with minerals daily  NUTRITION DIAGNOSIS:   Increased nutrient needs related to hip fracture as evidenced by estimated needs.  GOAL:   Patient will meet greater than or equal to 90% of their needs  MONITOR:   Supplement acceptance, PO intake, Labs, Weight trends  REASON FOR ASSESSMENT:   Consult Hip fracture protocol  ASSESSMENT:   Pt admitted from [facility] with R hip fracture after a fall. PMH includes afib, CAD, HTN, dementia and h/o of lymphoma.  02/14/21: R IM nail intertrochanteric surgery  Pt out of room for surgery. Family present in room, provided history. Pt from memory care unit at [facility] with advanced dementia. They report she was previously active before hip fracture. Also noted a previous fall and L clavicle fracture a few months ago. They report she was eating well PTA. They are agreeable for her to receive Ensure during admission and also report she loves ice cream. Will send both supplements to ensure adequate nutritional needs are being met.  Pt's family reports a usual weight of 115 lbs and deny recent weight loss. Per review of chart, noted 8% weight loss since December 2022 which is significant for time frame.  Medications: colace, IV abx, IV tranexamic acid  Labs: Cr 1.23, Calcium 8.8  NUTRITION - FOCUSED PHYSICAL EXAM: Pt out of room, defer to follow up.  Diet Order:   Diet Order             Diet regular Room service appropriate? Yes; Fluid consistency: Thin  Diet effective now                   EDUCATION NEEDS:   No education needs have been identified at this time  Skin:  Skin Assessment: Reviewed RN Assessment  Last BM:  PTA  Height:   Ht  Readings from Last 1 Encounters:  02/14/21 '4\' 11"'  (1.499 m)    Weight:   Wt Readings from Last 1 Encounters:  02/14/21 49.9 kg   BMI:  Body mass index is 22.22 kg/m.  Estimated Nutritional Needs:   Kcal:  1500-1700  Protein:  75-85g  Fluid:  >/=1.5L  Clayborne Dana, RDN, LDN Clinical Nutrition

## 2021-02-14 NOTE — Progress Notes (Signed)
Olena Heckle, NNP and Shanon Brow, RN RRT, and Nellie, Writer were notified. See orders.

## 2021-02-14 NOTE — Op Note (Signed)
Date of Surgery: 02/14/2021  INDICATIONS: Kathy Massey is a 88 y.o.-year-old female with displaced right intertrochanteric hip fracture.  The risk and benefits of the procedure with discussed in detail and documented in the pre-operative evaluation.  PREOPERATIVE DIAGNOSIS: 1.  Displaced right intertrochanteric hip fracture  POSTOPERATIVE DIAGNOSIS: Same.  PROCEDURE: 1.  Right hip cephalomedullary nail  SURGEON: Yevonne Pax MD  ASSISTANT: Raynelle Fanning, ATC; necessary for the timely completion of procedure and due to complexity of procedure.  ANESTHESIA:  general  IV FLUIDS AND URINE: See anesthesia record.  ANTIBIOTICS: Ancef 2 g  ESTIMATED BLOOD LOSS: 15 mL.  IMPLANTS:  Implant Name Type Inv. Item Serial No. Manufacturer Lot No. LRB No. Used Action  NAIL AFFIXUS RT 11X340MM - [id number] Nail NAIL AFFIXUS RT 11X340MM  [employer](ORTH,TRAU,BIO,SG) [id number] Right 1 Implanted  HIP FRAC NAIL LAG SCR 10.5X100 - [id number] Orthopedic Implant HIP FRAC NAIL LAG SCR 10.5X100  [employer](ORTH,TRAU,BIO,SG) [id number] Right 1 Implanted  CORTICAL BONE SCR 5.0MM X 48MM - [id number] Screw CORTICAL BONE SCR 5.0MM X 48MM  [employer](ORTH,TRAU,BIO,SG) [id number] Right 1 Implanted    DRAINS: None  CULTURES: None  COMPLICATIONS: none  DESCRIPTION OF PROCEDURE:  Patient was identified in the preoperative holding area.  The correct site was noted according to universal protocol with nursing.  She was given Ancef prior to skin incision.  She was subsequently taken back to the operating room.  Anesthesia was induced.  She was prepped and draped in the usual sterile fashion on the Hana table.  Traction internal rotation was utilized in order to obtain anatomic reduction.  Again final timeout was held confirming the correct site.  Began with an approach to the posterior lateral greater trochanter.  10 blade was used to incise through skin and then through IT band.  Electrocautery was used to cauterize  any bleeding vessels.  The starting pin was used in the center center position at the tip of the greater trochanter.  This was driven to the level of the lesser trochanter.  The opening reamer was used.  We subsequently placed the ball-tipped guidewire down to the distal aspect of the femur.  This was confirmed on AP and lateral fluoroscopy.  A size 12-1/2 reamer was then used.  This met little resistance.  A appropriately sized nail was then placed according to a measurement off of the ball-tipped wire.  This was placed to the correct level.  The femoral neck guidewire was then placed.  This was done under AP and lateral fluoroscopy to achieve a center center position.  The length of the screw was then measured off of this wire and we drilled to a 100 mm level.  The lag screw was then placed.  The setscrew was tightened and backed off a half turn.  We then turned attention to the distal interlock.  Perfect circles technique was utilized.  Skin incision was made over the proximal aspect of the oblong hole.  A drill and then depth gauge was used.  The appropriate length screw was taken.  This was placed and had excellent purchase.  All wounds were thoroughly irrigated.  Final AP and lateral fluoroscopy's were obtained.  1 g of vancomycin powder was used in the proximalmost wound.  The wounds were closed in layers of 0 Vicryl 2-0 Vicryl and staples.  Mepilex dressings were placed.  All counts were correct at the end of the case.  The patient was taken to the PACU without complication  Raynelle Fanning ATC was necessary for opening, closing, retracting, limb positioning and overall facilitation and timely completion of the procedure.     POSTOPERATIVE PLAN: Patient be weightbearing as tolerated on the right leg.  She will receive physical therapy on postop day 1.  She will be given a diet.  She will follow-up with me in 2 weeks for staple removal.  Yevonne Pax, MD 11:15 AM

## 2021-02-14 NOTE — Anesthesia Procedure Notes (Signed)
Procedure Name: Intubation Date/Time: 02/14/2021 10:15 AM Performed by: Amadeo Garnet, CRNA Pre-anesthesia Checklist: Patient identified, Emergency Drugs available, Suction available and Patient being monitored Patient Re-evaluated:Patient Re-evaluated prior to induction Oxygen Delivery Method: Circle system utilized Preoxygenation: Pre-oxygenation with 100% oxygen Induction Type: IV induction Ventilation: Mask ventilation without difficulty Laryngoscope Size: Mac and 3 Grade View: Grade III Tube type: Oral Number of attempts: 1 Airway Equipment and Method: Bougie stylet Placement Confirmation: ETT inserted through vocal cords under direct vision, positive ETCO2 and breath sounds checked- equal and bilateral Secured at: 22 cm Tube secured with: Tape Dental Injury: Teeth and Oropharynx as per pre-operative assessment

## 2021-02-15 DIAGNOSIS — S72141A Displaced intertrochanteric fracture of right femur, initial encounter for closed fracture: Secondary | ICD-10-CM | POA: Diagnosis not present

## 2021-02-15 DIAGNOSIS — I4821 Permanent atrial fibrillation: Secondary | ICD-10-CM

## 2021-02-15 DIAGNOSIS — E8770 Fluid overload, unspecified: Secondary | ICD-10-CM

## 2021-02-15 DIAGNOSIS — S72001A Fracture of unspecified part of neck of right femur, initial encounter for closed fracture: Secondary | ICD-10-CM | POA: Diagnosis not present

## 2021-02-15 DIAGNOSIS — Z66 Do not resuscitate: Secondary | ICD-10-CM | POA: Diagnosis not present

## 2021-02-15 DIAGNOSIS — R739 Hyperglycemia, unspecified: Secondary | ICD-10-CM | POA: Diagnosis not present

## 2021-02-15 LAB — CBC WITH DIFFERENTIAL/PLATELET
Abs Immature Granulocytes: 0.03 10*3/uL (ref 0.00–0.07)
Basophils Absolute: 0 10*3/uL (ref 0.0–0.1)
Basophils Relative: 1 %
Eosinophils Absolute: 0.1 10*3/uL (ref 0.0–0.5)
Eosinophils Relative: 1 %
HCT: 30.8 % — ABNORMAL LOW (ref 36.0–46.0)
Hemoglobin: 10 g/dL — ABNORMAL LOW (ref 12.0–15.0)
Immature Granulocytes: 1 %
Lymphocytes Relative: 11 %
Lymphs Abs: 0.6 10*3/uL — ABNORMAL LOW (ref 0.7–4.0)
MCH: 32.4 pg (ref 26.0–34.0)
MCHC: 32.5 g/dL (ref 30.0–36.0)
MCV: 99.7 fL (ref 80.0–100.0)
Monocytes Absolute: 0.4 10*3/uL (ref 0.1–1.0)
Monocytes Relative: 8 %
Neutro Abs: 4.5 10*3/uL (ref 1.7–7.7)
Neutrophils Relative %: 78 %
Platelets: 112 10*3/uL — ABNORMAL LOW (ref 150–400)
RBC: 3.09 MIL/uL — ABNORMAL LOW (ref 3.87–5.11)
RDW: 16.6 % — ABNORMAL HIGH (ref 11.5–15.5)
WBC: 5.7 10*3/uL (ref 4.0–10.5)
nRBC: 0 % (ref 0.0–0.2)

## 2021-02-15 LAB — COMPREHENSIVE METABOLIC PANEL
ALT: 9 U/L (ref 0–44)
AST: 25 U/L (ref 15–41)
Albumin: 2.6 g/dL — ABNORMAL LOW (ref 3.5–5.0)
Alkaline Phosphatase: 84 U/L (ref 38–126)
Anion gap: 8 (ref 5–15)
BUN: 16 mg/dL (ref 8–23)
CO2: 24 mmol/L (ref 22–32)
Calcium: 8 mg/dL — ABNORMAL LOW (ref 8.9–10.3)
Chloride: 106 mmol/L (ref 98–111)
Creatinine, Ser: 1.22 mg/dL — ABNORMAL HIGH (ref 0.44–1.00)
GFR, Estimated: 42 mL/min — ABNORMAL LOW (ref >60)
Glucose, Bld: 95 mg/dL (ref 70–99)
Potassium: 3.8 mmol/L (ref 3.5–5.1)
Sodium: 138 mmol/L (ref 135–145)
Total Bilirubin: 1.2 mg/dL (ref 0.3–1.2)
Total Protein: 4.6 g/dL — ABNORMAL LOW (ref 6.5–8.1)

## 2021-02-15 LAB — PHOSPHORUS: Phosphorus: 3.6 mg/dL (ref 2.5–4.6)

## 2021-02-15 LAB — BRAIN NATRIURETIC PEPTIDE: B Natriuretic Peptide: 227.9 pg/mL — ABNORMAL HIGH (ref 0.0–100.0)

## 2021-02-15 LAB — RETICULOCYTES
Immature Retic Fract: 16.3 % — ABNORMAL HIGH (ref 2.3–15.9)
RBC.: 3.12 MIL/uL — ABNORMAL LOW (ref 3.87–5.11)
Retic Count, Absolute: 52.1 10*3/uL (ref 19.0–186.0)
Retic Ct Pct: 1.7 % (ref 0.4–3.1)

## 2021-02-15 LAB — IRON AND TIBC
Iron: 20 ug/dL — ABNORMAL LOW (ref 28–170)
Saturation Ratios: 9 % — ABNORMAL LOW (ref 10.4–31.8)
TIBC: 227 ug/dL — ABNORMAL LOW (ref 250–450)
UIBC: 207 ug/dL

## 2021-02-15 LAB — FERRITIN: Ferritin: 99 ng/mL (ref 11–307)

## 2021-02-15 LAB — MAGNESIUM: Magnesium: 2 mg/dL (ref 1.7–2.4)

## 2021-02-15 LAB — FOLATE: Folate: 9.1 ng/mL (ref >5.9)

## 2021-02-15 LAB — VITAMIN B12: Vitamin B-12: 1291 pg/mL — ABNORMAL HIGH (ref 180–914)

## 2021-02-15 MED ORDER — DILTIAZEM HCL 25 MG/5ML IV SOLN
10.0000 mg | Freq: Once | INTRAVENOUS | Status: AC
  Administered 2021-02-15: 10 mg via INTRAVENOUS
  Filled 2021-02-15 (×2): qty 5

## 2021-02-15 MED ORDER — APIXABAN 2.5 MG PO TABS
2.5000 mg | ORAL_TABLET | Freq: Two times a day (BID) | ORAL | Status: DC
  Administered 2021-02-15 – 2021-02-24 (×19): 2.5 mg via ORAL
  Filled 2021-02-15 (×20): qty 1

## 2021-02-15 MED ORDER — LACTATED RINGERS IV BOLUS
250.0000 mL | Freq: Once | INTRAVENOUS | Status: AC
  Administered 2021-02-15: 250 mL via INTRAVENOUS

## 2021-02-15 MED ORDER — AMIODARONE HCL IN DEXTROSE 360-4.14 MG/200ML-% IV SOLN
60.0000 mg/h | INTRAVENOUS | Status: DC
  Filled 2021-02-15: qty 200

## 2021-02-15 MED ORDER — AMIODARONE HCL IN DEXTROSE 360-4.14 MG/200ML-% IV SOLN
30.0000 mg/h | INTRAVENOUS | Status: DC
  Filled 2021-02-15: qty 200

## 2021-02-15 NOTE — Progress Notes (Signed)
PROGRESS NOTE    Kathy Massey  YNW:[id number] DOB: 1928-01-28 DOA: 02/13/2021 PCP: [name], MD   Brief Narrative:  The patient is a elderly 88 year old Caucasian female past medical history significant for but not limited to atrial fibrillation on anticoagulation with apixaban, CAD, hypertension, history of dementia as well as history of lymphoma presenting after a fall.  Patient has advanced dementia and she lives in a memory care unit and she was unable to provide a subjective history.  Patient's children are at bedside and provides all the history.  She had behavioral disturbances started around 6 PM and became quite agitated on low-dose Lexapro 2.5 mg daily.  Her primary complaint was dizziness and patient stated that she may have been on too many medications and subsequently she fell a few months ago and broke her left clavicle.  She usually walks unassisted and yesterday she was found on the floor of her room around 5:30 AM.  She is only alert to person and had no idea what happened.  She had a unwitnessed fall and was found to have a right hip fracture and orthopedic surgery was consulted and they did a right hip intertrochanteric hip fixation with a nail on 02/14/2021.  Remains confused and this morning she is back in atrial fibrillation with RVR with rates in the 150s.  She was given IV diltiazem 10 g x 1 and became slightly hypotensive.  Because of her uncontrolled heart rates cardiology was consulted for further evaluation recommendations and she had blood pressure and limited options and they are going to add IV amiodarone for now.  Orthopedic surgery recommends resuming anticoagulation at this point.  She does appear little volume overload and since the daughter had endorsed increased lower extremity edema we have ordered an echocardiogram and cardiology feels that she could benefit from Lasix once blood pressure is more stable.   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 after midnight last night and went for surgical intervention today -SCDs overnight, and orthopedic surgery recommending resuming apixaban tomorrow -Pain control with Tylenol, Robaxin, Oxycodone, and Morphine prn -TOC team consult for rehab placement -Will need PT consult post-operatively -Hip fracture order set utilized -TXA per orthopedics -Fascia iliacus block ordered per anesthesia -Per Ortho she is to be weightbearing as tolerated on the right leg and she will refuse physical therapy on day 1.  They are going to follow-up with the patient in the outpatient setting in 2 weeks for staple removal -Per Ortho resume apixaban today cardiology is going to be discussing risks and benefits of resuming anticoagulation given her recurrent falls given that she is fallen at least more than once recently; she was already resumed on 2.5 mg p.o. twice daily this a.m. and will consider discontinuing pending further discussion   Pre-operative stratification -Orthopedic/spinal surgery is associated with an intermediate (1-5%) cardiovascular risk for cardiac death and nonfatal MI -Her revised cardiac index  gives a risk estimate of 3.9% -Her Detsky's Modified Cardiac Risk Index score is 15, with a low cardiac risk -It is reasonable for her to go to the OR without additional evaluation   Hyperglycemia-  (present on admission) -Mild hyperglycemia without known h/o DM -She is unlikely to suffer long-term effects of uncontrolled DM regardless -Would not monitor and recommend surveillance only with significantly elevated glucose -CBG yesterday the day before yesterday was 121 and this a.m. was 95.  Continue monitor on daily BMPs and CMP's   Goals of care  -DNR (do not resuscitate)- (present on admission) -I have discussed code status with the patient's son and daughter and they are in agreement that the patient would not desire resuscitation and would prefer to die a natural death should that situation arise. -She will need a gold out of facility DNR form at the time of discharge -They also prefer to minimize medications on an ongoing basis if possible   Mixed Alzheimer's and vascular dementia ([place])- (present on admission) -Family describes significant sundowning (they refer to it as "anxiety" but describe behavioral disturbance -She was started on low-dose Lexapro (2.5 mg) without obvious improvement -Will stop Lexapro and add Seroquel nightly at 6 pm and will continue Seroquel -Will hold Namenda and consider stopping this, as well; she has advanced disease and is unlikely to have benefit from ongoing therapy -Delirium precautions -Daughter stayed with the patient yesterday -Telesitter   Persistent atrial fibrillation with RVR ([place])- (present on admission) -Rate controlled with Diltiazem but she is late for dose so continues to have tachycardia in the 120s and then become uncontrolled.  Received her home dose of diltiazem this morning and became hypotensive -Will monitor on telemetry -She has been on Eliquis but this is on hold and per Ortho recommending resuming this a.m. but will defer to cardiology to reinitiate given that the risks may outweigh the benefits -Consider fall risk when deciding whether to resume and cardiology to help guide this but she was amenable to plan, use p.o. twice  daily this morning resumed on 2.5 mg of p.o. 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  -She was treated with acalabrutinib for recurrent disease, stopped due to toxicity -No obvious evidence of recurrence -Followed by Dr. Marin Olp -Last seen in Dec, plan for spring f/u  Volume overload -Patient has bilateral lower extremity edema and she does have some JVD as well as some rales -We are checking echocardiogram and a BNP and this is still pending to be done -Cardiology to follow-up on volume decide on Lasix for blood pressures are more stable -Strict I's and O's and daily weights -Continue to monitor for signs and symptoms of volume overload  Acute postoperative blood loss anemia superimposed on normocytic anemia/anemia of chronic disease -Patient's hemoglobin/hematocrit went from 11.6/36.2 is now 11.7/37.1 and has further dropped to 10.0/30.8 as an expected drop -Checked anemia panel and showed an iron level of 20, U IBC of 207, TIBC of 227, saturation ratios of 9%, ferritin level of 99, folate of 9.1 -Expect to  drop postoperatively little bit further -Continue to monitor for signs and symptoms of bleeding; currently no overt bleeding noted -Repeat CBC in the a.m.  Thrombocytopenia -Platelet count went from 156 is now 141 yesterday and today is 112 -Continue to monitor for signs and symptoms of bleeding; no overt bleeding noted -Repeat  CBC in the a.m.  DVT prophylaxis: SCDs and will resume apixaban in the morning per Ortho recommendation Code Status: DO NOT RESUSCITATE Family Communication: Discussed with the daughter at bedside Disposition Plan: Pending PT OT evaluations  Status is: Inpatient  Remains inpatient appropriate because: She will need PT OT to further evaluate and orthopedic surgery clearance prior to safe discharge disposition  Consultants:  Orthopedic surgery Cardiology  Procedures:  Right hip cephalomedullary nail  Antimicrobials:  Anti-infectives (From admission, onward)    Start     Dose/Rate Route Frequency Ordered Stop   02/14/21 1800  ceFAZolin (ANCEF) IVPB 2g/100 mL premix        2 g 200 mL/hr over 30 Minutes Intravenous Every 12 hours 02/14/21 1324 02/15/21 0608   02/14/21 1400  ceFAZolin (ANCEF) IVPB 2g/100 mL premix  Status:  Discontinued        2 g 200 mL/hr over 30 Minutes Intravenous Every 8 hours 02/14/21 1306 02/14/21 1322   02/14/21 1104  vancomycin (VANCOCIN) powder  Status:  Discontinued          As needed 02/14/21 1104 02/14/21 1123   02/14/21 0915  ceFAZolin (ANCEF) IVPB 2g/100 mL premix        2 g 200 mL/hr over 30 Minutes Intravenous On call to O.R. 02/14/21 0902 02/14/21 1031   02/14/21 0906  ceFAZolin (ANCEF) 2-4 GM/100ML-% IVPB       Note to Pharmacy: Gustavo Lah J: cabinet override      02/14/21 0906 02/14/21 1052        Subjective: Seen and examined this a.m. and remains pleasantly demented and cardiology was consulted given that her heart rates were uncontrolled and that because she was hypotensive.  She is unable to provide a subjective history but knows who she is but that is the extent of her orientation.  Daughter thinks that her legs have been little bit more swollen recently.  Has been falling multiple times.  She denies any chest pain or shortness of breath is wearing supplemental oxygen via nasal cannula and heart rates are  uncontrolled.  Objective: Vitals:   02/15/21 0633 02/15/21 0743 02/15/21 1126 02/15/21 1400  BP: (!) 122/97 108/77 (!) 87/61 (!) 135/106  Pulse: (!) 123 (!) 52 74 (!) 104  Resp: 20 16 17 18   Temp: 98 F (36.7 C) 98.7 F (37.1 C) 98 F (36.7 C)   TempSrc: Oral Oral    SpO2: 100% 98% 93%   Weight:      Height:        Intake/Output Summary (Last 24 hours) at 02/15/2021 1504 Last data filed at 02/15/2021 0930 Gross per 24 hour  Intake 620 ml  Output 450 ml  Net 170 ml    Filed Weights   02/13/21 0855 02/14/21 0911  Weight: 49.9 kg 49.9 kg   Examination: Physical Exam:  Constitutional: Patient is a thin frail chronically ill-appearing Caucasian female currently in no acute distress Eyes: Lids and conjunctive are normal. ENMT: External Ears, Nose appear normal. Grossly normal hearing.  Neck: Appears normal, supple, no cervical masses, normal ROM, no appreciable thyromegaly; has some JVD Respiratory: Diminished to auscultation bilaterally with coarse breath sounds and  crackles, no wheezing, rales, rhonchi. Normal respiratory effort and patient is not tachypenic. No accessory muscle use.  Wearing supplemental oxygen via nasal cannula Cardiovascular: Irregularly irregular and significantly tachycardic, no murmurs / rubs / gallops. S1 and S2 auscultated.  Has 1+ lower extremity edema bilaterally Abdomen: Soft, non-tender, non-distended.  Bowel sounds positive.  GU: Deferred. Musculoskeletal: No clubbing / cyanosis of digits/nails. No joint deformity upper and lower extremities.  Skin: No rashes, lesions, ulcers on limited skin evaluation. No induration; Warm and dry.  Neurologic: CN 2-12 grossly intact with no focal deficits. Romberg sign and cerebellar reflexes not assessed.  Psychiatric: Impaired judgment and insight. Alert and oriented x 1. Normal mood and appropriate affect.   Data Reviewed: I have personally reviewed following labs and imaging studies  CBC: Recent Labs  Lab  02/13/21 0716 02/14/21 0309 02/15/21 0352  WBC 8.6 6.4 5.7  NEUTROABS  --   --  4.5  HGB 11.6* 11.7* 10.0*  HCT 36.2 37.1 30.8*  MCV 98.4 98.9 99.7  PLT 156 141* 112*    Basic Metabolic Panel: Recent Labs  Lab 02/13/21 0716 02/14/21 0309 02/15/21 0352  NA 140 142 138  K 3.7 4.1 3.8  CL 108 106 106  CO2 23 28 24   GLUCOSE 121* 131* 95  BUN 16 16 16   CREATININE 1.13* 1.23* 1.22*  CALCIUM 8.7* 8.8* 8.0*  MG  --   --  2.0  PHOS  --   --  3.6    GFR: Estimated Creatinine Clearance: 20.5 mL/min (A) (by C-G formula based on SCr of 1.22 mg/dL (H)). Liver Function Tests: Recent Labs  Lab 02/15/21 0352  AST 25  ALT 9  ALKPHOS 84  BILITOT 1.2  PROT 4.6*  ALBUMIN 2.6*   No results for input(s): LIPASE, AMYLASE in the last 168 hours. No results for input(s): AMMONIA in the last 168 hours. Coagulation Profile: No results for input(s): INR, PROTIME in the last 168 hours. Cardiac Enzymes: No results for input(s): CKTOTAL, CKMB, CKMBINDEX, TROPONINI in the last 168 hours. BNP (last 3 results) No results for input(s): PROBNP in the last 8760 hours. HbA1C: No results for input(s): HGBA1C in the last 72 hours. CBG: No results for input(s): GLUCAP in the last 168 hours. Lipid Profile: No results for input(s): CHOL, HDL, LDLCALC, TRIG, CHOLHDL, LDLDIRECT in the last 72 hours. Thyroid Function Tests: No results for input(s): TSH, T4TOTAL, FREET4, T3FREE, THYROIDAB in the last 72 hours. Anemia Panel: Recent Labs    02/15/21 0352  VITAMINB12 1,291*  FOLATE 9.1  FERRITIN 99  TIBC 227*  IRON 20*  RETICCTPCT 1.7   Sepsis Labs: No results for input(s): PROCALCITON, LATICACIDVEN in the last 168 hours.  Recent Results (from the past 240 hour(s))  Resp Panel by RT-PCR (Flu A&B, Covid) Nasopharyngeal Swab     Status: None   Collection Time: 02/13/21 11:44 AM   Specimen: Nasopharyngeal Swab; Nasopharyngeal(NP) swabs in vial transport medium  Result Value Ref Range Status    SARS Coronavirus 2 by RT PCR NEGATIVE NEGATIVE Final    Comment: (NOTE) SARS-CoV-2 target nucleic acids are NOT DETECTED.  The SARS-CoV-2 RNA is generally detectable in upper respiratory specimens during the acute phase of infection. The lowest concentration of SARS-CoV-2 viral copies this assay can detect is 138 copies/mL. A negative result does not preclude SARS-Cov-2 infection and should not be used as the sole basis for treatment or other patient management decisions. A negative result may occur with  improper specimen  collection/handling, submission of specimen other than nasopharyngeal swab, presence of viral mutation(s) within the areas targeted by this assay, and inadequate number of viral copies(<138 copies/mL). A negative result must be combined with clinical observations, patient history, and epidemiological information. The expected result is Negative.  Fact Sheet for Patients:  EntrepreneurPulse.com.au  Fact Sheet for Healthcare Providers:  IncredibleEmployment.be  This test is no t yet approved or cleared by the [place] FDA and  has been authorized for detection and/or diagnosis of SARS-CoV-2 by FDA under an Emergency Use Authorization (EUA). This EUA will remain  in effect (meaning this test can be used) for the duration of the COVID-19 declaration under Section 564(b)(1) of the Act, 21 U.S.C.section 360bbb-3(b)(1), unless the authorization is terminated  or revoked sooner.       Influenza A by PCR NEGATIVE NEGATIVE Final   Influenza B by PCR NEGATIVE NEGATIVE Final    Comment: (NOTE) The Xpert Xpress SARS-CoV-2/FLU/RSV plus assay is intended as an aid in the diagnosis of influenza from Nasopharyngeal swab specimens and should not be used as a sole basis for treatment. Nasal washings and aspirates are unacceptable for Xpert Xpress SARS-CoV-2/FLU/RSV testing.  Fact Sheet for  Patients: EntrepreneurPulse.com.au  Fact Sheet for Healthcare Providers: IncredibleEmployment.be  This test is not yet approved or cleared by the [place] FDA and has been authorized for detection and/or diagnosis of SARS-CoV-2 by FDA under an Emergency Use Authorization (EUA). This EUA will remain in effect (meaning this test can be used) for the duration of the COVID-19 declaration under Section 564(b)(1) of the Act, 21 U.S.C. section 360bbb-3(b)(1), unless the authorization is terminated or revoked.  Performed at [facility], [place] [address], [place], [place] [address]   Surgical pcr screen     Status: None   Collection Time: 02/13/21  8:24 PM   Specimen: Nasal Mucosa; Nasal Swab  Result Value Ref Range Status   MRSA, PCR NEGATIVE NEGATIVE Final   Staphylococcus aureus NEGATIVE NEGATIVE Final    Comment: (NOTE) The Xpert SA Assay (FDA approved for NASAL specimens in patients 14 years of age and older), is one component of a comprehensive surveillance program. It is not intended to diagnose infection nor to guide or monitor treatment. Performed at [facility], [place] [address], [place], [place] [address]      RN Pressure Injury Documentation:     Estimated body mass index is 22.22 kg/m as calculated from the following:   Height as of this encounter: 4\' 11"  (1.499 m).   Weight as of this encounter: 49.9 kg.  Malnutrition Type:  Nutrition Problem: Increased nutrient needs Etiology: hip fracture  Malnutrition Characteristics:  Signs/Symptoms: estimated needs  Nutrition Interventions:  Interventions: Ensure Enlive (each supplement provides 350kcal and 20 grams of protein), MVI   Radiology Studies: DG C-Arm 1-60 Min-No Report  Result Date: 02/14/2021 CLINICAL DATA:  Intramedullary rod fixation of right femur fracture. EXAM: RIGHT FEMUR 2 VIEWS; DG C-ARM 1-60 MIN-NO REPORT Radiation exposure index: 7.05 mGy.  COMPARISON:  February 13, 2021. FINDINGS: Six intraoperative fluoroscopic images were obtained of the right femur. These images demonstrate surgical internal fixation of intertrochanteric fracture involving proximal right femur. Intramedullary rod fixation of right femur is noted as well. IMPRESSION: Fluoroscopic guidance provided during surgical internal fixation of proximal right femoral intertrochanteric fracture. Electronically Signed   By: Marijo Conception M.D.   On: 02/14/2021 12:26   DG C-Arm 1-60 Min-No Report  Result Date: 02/14/2021 CLINICAL DATA:  Intramedullary rod fixation of right femur fracture. EXAM: RIGHT FEMUR 2 VIEWS; DG C-ARM 1-60 MIN-NO REPORT Radiation exposure index: 7.05 mGy. COMPARISON:  February 13, 2021. FINDINGS: Six intraoperative fluoroscopic images were obtained of the right femur. These images demonstrate surgical internal fixation of intertrochanteric fracture involving proximal right femur. Intramedullary rod fixation of right femur is noted as well. IMPRESSION: Fluoroscopic guidance provided during surgical internal fixation of proximal right femoral intertrochanteric fracture. Electronically Signed   By: Marijo Conception M.D.   On: 02/14/2021 12:26   DG FEMUR, MIN 2 VIEWS RIGHT  Result Date: 02/14/2021 CLINICAL DATA:  Intramedullary rod fixation of right femur fracture. EXAM: RIGHT FEMUR 2 VIEWS; DG C-ARM 1-60 MIN-NO REPORT Radiation exposure index: 7.05 mGy. COMPARISON:  February 13, 2021. FINDINGS: Six intraoperative fluoroscopic images were obtained of the right femur. These images demonstrate surgical internal fixation of intertrochanteric fracture involving proximal right femur. Intramedullary rod fixation of right femur is noted as well. IMPRESSION: Fluoroscopic guidance provided during surgical internal fixation of proximal right femoral intertrochanteric fracture. Electronically Signed   By: Marijo Conception M.D.   On: 02/14/2021 12:26    Scheduled Meds:  apixaban  2.5 mg  Oral BID   Chlorhexidine Gluconate Cloth  6 each Topical Daily   docusate sodium  100 mg Oral BID   feeding supplement  237 mL Oral BID BM   mouth rinse  15 mL Mouth Rinse BID   multivitamin with minerals  1 tablet Oral Daily   QUEtiapine  25 mg Oral QHS   Continuous Infusions:  methocarbamol (ROBAXIN) IV      LOS: 2 days   Kerney Elbe, DO [facility] PAGER is on AMION  If 7PM-7AM, please contact night-coverage [email or web address]

## 2021-02-15 NOTE — TOC Initial Note (Addendum)
Transition of Care [facility]) - Initial/Assessment Note    Patient Details  Name: Kathy Massey MRN: [id number] Date of Birth: 1927/05/15  Transition of Care [facility]) CM/SW Contact:    Emeterio Reeve, LCSW Phone Number: 02/15/2021, 12:51 PM  Clinical Narrative:                  CSW received SNF consult. CSW spoke to daughter [name] on phone. CSW introduced self and explained role at the hospital. [name] reports that PTA she was at [facility] and has been for about 4-5 years. Pt ambulates on her own and completes ADLs on her own.   CSW reviewed PT/OT recommendations for SNF. Daughter stated that she assumed she would need SNF. Pt is from [facility] and they confirmed that [facility] can take pt for SNF when ready for DC. CSW gave pt medicare.gov rating list to review. CSW explained insurance auth process. Pt is managed by Everlene Balls, CSW will start insurance auth before DC.  CSW will continue to follow.  Expected Discharge Plan: Skilled Nursing Facility Barriers to Discharge: Continued Medical Work up, Ship broker   Patient Goals and CMS Choice Patient states their goals for this hospitalization and ongoing recovery are:: Pt unable to verbalize goals CMS Medicare.gov Compare Post Acute Care list provided to:: Patient Choice offered to / list presented to : Adult Children  Expected Discharge Plan and Services Expected Discharge Plan: [place] arrangements for the past 2 months: [place]                                      Prior Living Arrangements/Services Living arrangements for the past 2 months: [place] Lives with:: Facility Resident Patient language and need for interpreter reviewed:: Yes Do you feel safe going back to the place where you live?: Yes      Need for Family Participation in Patient Care: Yes (Comment) Care giver support system in place?: Yes (comment)   Criminal Activity/Legal  Involvement Pertinent to Current Situation/Hospitalization: No - Comment as needed  Activities of Daily Living      Permission Sought/Granted   Permission granted to share information with : Yes, Verbal Permission Granted     Permission granted to share info w AGENCY: SNF        Emotional Assessment   Attitude/Demeanor/Rapport: Unable to Assess Affect (typically observed): Unable to Assess Orientation: : Oriented to Self Alcohol / Substance Use: Not Applicable Psych Involvement: No (comment)  Admission diagnosis:  Hip fracture ([place]) [S72.009A] Fall, initial encounter [W19.XXXA] Fracture, intertrochanteric, right femur, closed, initial encounter ([place]) [S72.141A] Atrial fibrillation, unspecified type [facility]) [I48.91] Patient Active Problem List   Diagnosis Date Noted   Fracture, intertrochanteric, right femur, closed, initial encounter ([place])    Hip fracture ([place]) 02/13/2021   DNR (do not resuscitate) 02/13/2021   Hyperglycemia 02/13/2021   Blood clotting disorder ([place]) 12/14/2020   Mixed Alzheimer's and vascular dementia ([place]) 04/13/2020   Alzheimer's dementia with behavioral disturbance ([place]) 10/13/2019   Open wound of scalp 03/01/2018   Anticoagulant long-term use 07/20/2017   Stage 3a chronic kidney disease (CKD) ([place]) 07/20/2017   Dizziness 07/20/2017   Other thrombophilia ([place]) 07/04/2017   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  Emeterio Reeve, LCSW Clinical Social Worker

## 2021-02-15 NOTE — Consult Note (Signed)
Cardiology Consultation:   Patient ID: Kathy Massey MRN: [id number]; DOB: 1927-11-15  Admit date: 02/13/2021 Date of Consult: 02/15/2021  PCP:  [name], MD   [facility] Cardiologist:  None  Electrophysiologist:  Will Meredith Leeds, MD       Patient Profile:   Kathy Massey is a 88 y.o. female with a hx of dementia, Afib w/ rate control strategy on Eliquis, MI 2017 felt 2nd stress induced w/ apical WMA at cath & no CAD, non-Hodgkin's lymphoma, IDA, osteoporosis, who is being seen 02/15/2021 for the evaluation of rapid atrial fib at the request of Dr Alfredia Ferguson.  History of Present Illness:   Kathy Massey last saw regular cardiology 2017. She started seeing Dr Curt Bears in 2018 for atrial fib, last visit 10/11/2020 >> rate control, continue Eliquis.  She was admitted 02/13/2021 after a fall, found down at 5:30 am. Pt w/ R hip fx s/p right hip intertrochanteric hip fixation with a nail on 02/14/2021. She tolerated the procedure well.   Pt HR was elevated on admit and has remained elevated >> Cards asked to see.   Kathy Massey is oriented to name and partly to place.  She thinks she is in [place], but knows she is in the hospital.  She hurts all over, most likely from the recent surgery.  She does not complain of any specific area of pain.  Her daughter was present at first stated that they had seen an increased amount of lower extremity edema in the last few weeks.  It is daytime, she does not know if the patient wakes up with it.  However, it is more than they are used to seeing.  She is on oxygen, and when the sats are being picked up well, they are in the mid 90s.  However, when the oxygen was out of her nose, they were consistently in the 80s.  Her heart rate is elevated in the 120s or more most of the time.  She is not aware of the atrial fibrillation and does not feel it at all.   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 EF > 55% by 06/16/15 echo.   ELBOW BURSA SURGERY Right 2016   EYE SURGERY     cataracts removed ,both eyes, /w IOL   LYMPH NODE BIOPSY Left 01/14/2018   Procedure: EXCISION DEEP LEFT INGUINAL LYMPH NODE BIOPSY ERAS PATHWAY;  Surgeon: Fanny Skates, MD;  Location: [facility];  Service: General;  Laterality: Left;   TONSILLECTOMY       Home Medications:  Prior to Admission medications   Medication Sig Start Date End Date Taking? Authorizing Provider  acetaminophen (TYLENOL) 500 MG tablet Take 1,000 mg by mouth 3 (three) times daily.   Yes [provider]  apixaban (ELIQUIS) 2.5 MG TABS tablet TAKE 1 TABLET BY MOUTH TWICE A DAY Patient taking differently: Take 2.5 mg by mouth 2 (two) times daily. 10/26/20  Yes Camnitz, Will Hassell Done, MD  atorvastatin (LIPITOR) 20 MG tablet Take 20 mg  by mouth at bedtime.   Yes [provider]  cyanocobalamin 1000 MCG tablet Take 1,000 mcg by mouth every morning.   Yes [provider]  diltiazem (CARDIZEM SR) 60 MG 12 hr capsule Take 1 tablet (60 mg total) once daily.  You may take a second tablet daily if needed for blood pressure and/or heart rate. Patient taking differently: 60 mg every morning. 03/05/18  Yes Camnitz, Will Hassell Done, MD  escitalopram (LEXAPRO) 5 MG tablet Take 5 mg by mouth every morning. 09/06/20  Yes [provider]  fluocinonide ointment (LIDEX) 6.38 % Apply 1 application topically See admin instructions. Order date 02/02/21 - apply topically to scalp twice daily Monday thru Friday until healed   Yes [provider]  memantine  (NAMENDA) 10 MG tablet Take 10 mg by mouth 2 (two) times daily. For memory 11/11/20  Yes [provider]    Inpatient Medications: Scheduled Meds:  apixaban  2.5 mg Oral BID   Chlorhexidine Gluconate Cloth  6 each Topical Daily   diltiazem  60 mg Oral Daily   docusate sodium  100 mg Oral BID   feeding supplement  237 mL Oral BID BM   mouth rinse  15 mL Mouth Rinse BID   multivitamin with minerals  1 tablet Oral Daily   QUEtiapine  25 mg Oral QHS   Continuous Infusions:  methocarbamol (ROBAXIN) IV     PRN Meds: acetaminophen, bisacodyl, methocarbamol **OR** methocarbamol (ROBAXIN) IV, morphine injection, ondansetron (ZOFRAN) IV, oxyCODONE, polyethylene glycol  Allergies:    Allergies  Allergen Reactions   Shellfish Allergy Itching and Swelling    Shrimp     Social History:   Social History   Socioeconomic History   Marital status: Widowed    Spouse name: Not on file   Number of children: Not on file   Years of education: Not on file   Highest education level: Not on file  Occupational History   Not on file  Tobacco Use   Smoking status: Never   Smokeless tobacco: Never   Tobacco comments:    never used tobacco  Vaping Use   Vaping Use: Never used  Substance and Sexual Activity   Alcohol use: Yes    Alcohol/week: 1.0 standard drink    Types: 1 Glasses of wine per week   Drug use: No   Sexual activity: Not on file  Other Topics Concern   Not on file  Social History Narrative   Not on file   Social Determinants of Health   Financial Resource Strain: Not on file  Food Insecurity: Not on file  Transportation Needs: Not on file  Physical Activity: Not on file  Stress: Not on file  Social Connections: Not on file  Intimate Partner Violence: Not on file    Family History:   Family History  Problem Relation Age of Onset   Heart disease Mother    Depression Mother    Heart failure Mother    Heart disease Father    Alcohol abuse Father    Heart  attack Father    Depression Father    Multiple myeloma Maternal Grandmother    Alcohol abuse Maternal Grandfather    Breast cancer Paternal Grandmother    Breast cancer Daughter      ROS:  Please see the history of present illness.  All other ROS reviewed and negative.     Physical Exam/Data:   Vitals:   02/15/21 7564 02/15/21 0743 02/15/21 1126 02/15/21 1400  BP: (!) 122/97 108/77 (!) 87/61 (!) 135/106  Pulse: (!) 123 (!) 52 74 (!) 104  Resp: '20 16 17 18  ' Temp: 98 F (36.7 C) 98.7 F (37.1 C) 98 F (36.7 C)   TempSrc: Oral Oral    SpO2: 100% 98% 93%   Weight:      Height:        Intake/Output Summary (Last 24 hours) at 02/15/2021 1455 Last data filed at 02/15/2021 0930 Gross per 24 hour  Intake 620 ml  Output 450 ml  Net 170 ml   Last 3 Weights 02/14/2021 02/13/2021 01/17/2021  Weight (lbs) 110 lb 110 lb 120 lb  Weight (kg) 49.896 kg 49.896 kg 54.432 kg     Body mass index is 22.22 kg/m.  General: Slender, frail, elderly female, who appears a bit anxious at baseline and easily becomes more agitated HEENT: normal for age Neck:  JVD 10 cm Vascular: No carotid bruits; Distal pulses 2+ bilaterally Cardiac:  normal S1, S2; irregular rate and rhythm; no murmur  Lungs: Poor inspiratory effort, some Rales heard to auscultation bilaterally, no wheezing, rhonchi  Abd: soft, diffusely tender, no hepatomegaly  Ext: no LLE edema, RLE has some edema in her thigh, ice packs are in place Musculoskeletal:  No deformities, BUE and BLE strength not tested due to patient condition Skin: warm and dry  Neuro:  CNs 2-12 intact, no focal abnormalities noted Psych:  Normal affect   EKG:  The EKG was personally reviewed and demonstrates:  atrial fib, HR 129 Telemetry:  Telemetry was personally reviewed and demonstrates:  Atrial fib, RVR in general with heart rates occasionally greater than 150  Relevant CV Studies:  14 day monitor: 2021 Max 173 bpm 10:06pm, 08/01 Min 54 bpm 08:50am,  07/30 Avg 96 bpm <1% PVCs 100% atrial fibrillation burden noted Triggered event associated with AF, rate 99-134  CARDIAC CATH: 06/17/2015 Findings:  1. No angiographically apparent coronary artery disease.  2. Normal left ventricular filling pressures (LVEDP = 9 mm Hg).  3. Borderline left ventricular contraction with apical akinesis,  suggestive of stress-induced cardiomyopathy.   Recommendations:  1. Cardiovascular risk reduction.  2. Follow up with primary cardiologist.   ECHO: 06/16/2015  Normal left ventricular systolic function, ejection fraction > 25%   Diastolic dysfunction - grade I (normal filling pressures)   Dilated left atrium - mild   Degenerative mitral valve disease   Tricuspid regurgitation - mild   Pulmonic regurgitation - mild   Normal right ventricular systolic function   Dilated right atrium - mild  Laboratory Data:  High Sensitivity Troponin:  No results for input(s): TROPONINIHS in the last 720 hours.   Chemistry Recent Labs  Lab 02/13/21 0716 02/14/21 0309 02/15/21 0352  NA 140 142 138  K 3.7 4.1 3.8  CL 108 106 106  CO2 '23 28 24  ' GLUCOSE 121* 131* 95  BUN '16 16 16  ' CREATININE 1.13* 1.23* 1.22*  CALCIUM 8.7* 8.8* 8.0*  MG  --   --  2.0  GFRNONAA 46* 41* 42*  ANIONGAP '9 8 8    ' Recent Labs  Lab 02/15/21 0352  PROT 4.6*  ALBUMIN 2.6*  AST 25  ALT 9  ALKPHOS 84  BILITOT 1.2   Lipids No results for input(s): CHOL, TRIG, HDL, LABVLDL, LDLCALC, CHOLHDL in the last 168 hours.  Hematology Recent Labs  Lab 02/13/21 0716 02/14/21 0309 02/15/21 0352  WBC 8.6 6.4 5.7  RBC 3.68* 3.75* 3.09*   3.12*  HGB  11.6* 11.7* 10.0*  HCT 36.2 37.1 30.8*  MCV 98.4 98.9 99.7  MCH 31.5 31.2 32.4  MCHC 32.0 31.5 32.5  RDW 16.6* 16.8* 16.6*  PLT 156 141* 112*   Thyroid No results found for: TSH   BNPNo results for input(s): BNP, PROBNP in the last 168 hours.  DDimer No results for input(s): DDIMER in the last 168  hours.   Radiology/Studies:  CT Head Wo Contrast  Result Date: 02/13/2021 CLINICAL DATA:  Un witnessed fall. Headache. Patient is on blood thinners. EXAM: CT HEAD WITHOUT CONTRAST CT CERVICAL SPINE WITHOUT CONTRAST TECHNIQUE: Multidetector CT imaging of the head and cervical spine was performed following the standard protocol without intravenous contrast. Multiplanar CT image reconstructions of the cervical spine were also generated. COMPARISON:  None. FINDINGS: CT HEAD FINDINGS Brain: No evidence of acute infarction, hemorrhage, hydrocephalus, extra-axial collection or mass lesion/mass effect. Ventricular and sulcal enlargement consistent with moderate atrophy. Patchy areas of white matter hypoattenuation are also noted consistent with mild chronic microvascular ischemic change. Vascular: No hyperdense vessel or unexpected calcification. Skull: Normal. Negative for fracture or focal lesion. Sinuses/Orbits: Globes and orbits are unremarkable. Sinuses are clear. Other: None. CT CERVICAL SPINE FINDINGS Alignment: Normal. Skull base and vertebrae: No acute fracture. No primary bone lesion or focal pathologic process. Soft tissues and spinal canal: No prevertebral fluid or swelling. No visible canal hematoma. Disc levels: Moderate loss of disc height at C3-C4 and C4-C5 and C6-C7. Mild disc bulging and endplate spurring noted at these levels. No convincing disc herniation. Upper chest: Multiple bone fragments adjacent to the distal left clavicle suspected to be an old fracture, but possibly acute/recent. Other: None. IMPRESSION: HEAD CT 1. No acute intracranial abnormalities. CERVICAL CT 1. No spine fracture or malalignment. 2. Distal left clavicle fractures, incompletely imaged and possibly chronic. Clinical correlation recommended. Electronically Signed   By: Lajean Manes M.D.   On: 02/13/2021 09:03   CT Cervical Spine Wo Contrast  Result Date: 02/13/2021 CLINICAL DATA:  Un witnessed fall. Headache. Patient is  on blood thinners. EXAM: CT HEAD WITHOUT CONTRAST CT CERVICAL SPINE WITHOUT CONTRAST TECHNIQUE: Multidetector CT imaging of the head and cervical spine was performed following the standard protocol without intravenous contrast. Multiplanar CT image reconstructions of the cervical spine were also generated. COMPARISON:  None. FINDINGS: CT HEAD FINDINGS Brain: No evidence of acute infarction, hemorrhage, hydrocephalus, extra-axial collection or mass lesion/mass effect. Ventricular and sulcal enlargement consistent with moderate atrophy. Patchy areas of white matter hypoattenuation are also noted consistent with mild chronic microvascular ischemic change. Vascular: No hyperdense vessel or unexpected calcification. Skull: Normal. Negative for fracture or focal lesion. Sinuses/Orbits: Globes and orbits are unremarkable. Sinuses are clear. Other: None. CT CERVICAL SPINE FINDINGS Alignment: Normal. Skull base and vertebrae: No acute fracture. No primary bone lesion or focal pathologic process. Soft tissues and spinal canal: No prevertebral fluid or swelling. No visible canal hematoma. Disc levels: Moderate loss of disc height at C3-C4 and C4-C5 and C6-C7. Mild disc bulging and endplate spurring noted at these levels. No convincing disc herniation. Upper chest: Multiple bone fragments adjacent to the distal left clavicle suspected to be an old fracture, but possibly acute/recent. Other: None. IMPRESSION: HEAD CT 1. No acute intracranial abnormalities. CERVICAL CT 1. No spine fracture or malalignment. 2. Distal left clavicle fractures, incompletely imaged and possibly chronic. Clinical correlation recommended. Electronically Signed   By: Lajean Manes M.D.   On: 02/13/2021 09:03   DG Shoulder Left  Result Date: 02/13/2021  CLINICAL DATA:  Status post fall with left shoulder pain. EXAM: LEFT SHOULDER - 2+ VIEW COMPARISON:  December 29, 2020 FINDINGS: Chronic change of the left clavicle is noted. There is no acute fracture  or dislocation. The visualized lung field is normal. IMPRESSION: No acute fracture or dislocation. Electronically Signed   By: Abelardo Diesel M.D.   On: 02/13/2021 08:29   DG C-Arm 1-60 Min-No Report  Result Date: 02/14/2021 CLINICAL DATA:  Intramedullary rod fixation of right femur fracture. EXAM: RIGHT FEMUR 2 VIEWS; DG C-ARM 1-60 MIN-NO REPORT Radiation exposure index: 7.05 mGy. COMPARISON:  February 13, 2021. FINDINGS: Six intraoperative fluoroscopic images were obtained of the right femur. These images demonstrate surgical internal fixation of intertrochanteric fracture involving proximal right femur. Intramedullary rod fixation of right femur is noted as well. IMPRESSION: Fluoroscopic guidance provided during surgical internal fixation of proximal right femoral intertrochanteric fracture. Electronically Signed   By: Marijo Conception M.D.   On: 02/14/2021 12:26   DG C-Arm 1-60 Min-No Report  Result Date: 02/14/2021 CLINICAL DATA:  Intramedullary rod fixation of right femur fracture. EXAM: RIGHT FEMUR 2 VIEWS; DG C-ARM 1-60 MIN-NO REPORT Radiation exposure index: 7.05 mGy. COMPARISON:  February 13, 2021. FINDINGS: Six intraoperative fluoroscopic images were obtained of the right femur. These images demonstrate surgical internal fixation of intertrochanteric fracture involving proximal right femur. Intramedullary rod fixation of right femur is noted as well. IMPRESSION: Fluoroscopic guidance provided during surgical internal fixation of proximal right femoral intertrochanteric fracture. Electronically Signed   By: Marijo Conception M.D.   On: 02/14/2021 12:26   DG Hip Unilat W or Wo Pelvis 2-3 Views Left  Result Date: 02/13/2021 CLINICAL DATA:  Status post fall with right hip pain. EXAM: DG HIP (WITH OR WITHOUT PELVIS) 2-3V LEFT COMPARISON:  None. FINDINGS: There is no evidence of hip fracture or dislocation. Degenerative joint changes of left hip are noted. IMPRESSION: No acute fracture or dislocation of left  hip. Electronically Signed   By: Abelardo Diesel M.D.   On: 02/13/2021 08:28   DG Hip Unilat W or Wo Pelvis 2-3 Views Right  Result Date: 02/13/2021 CLINICAL DATA:  Status post fall with right hip pain. EXAM: DG HIP (WITH OR WITHOUT PELVIS) 2-3V RIGHT COMPARISON:  None. FINDINGS: Displaced fracture of the right intertrochanteric region extending to the proximal right femoral shaft is identified. Degenerative joint changes of the left hip and. IMPRESSION: Displaced fracture of right intertrochanteric region extending to the proximal right femoral shaft. Electronically Signed   By: Abelardo Diesel M.D.   On: 02/13/2021 08:27   DG FEMUR, MIN 2 VIEWS RIGHT  Result Date: 02/14/2021 CLINICAL DATA:  Intramedullary rod fixation of right femur fracture. EXAM: RIGHT FEMUR 2 VIEWS; DG C-ARM 1-60 MIN-NO REPORT Radiation exposure index: 7.05 mGy. COMPARISON:  February 13, 2021. FINDINGS: Six intraoperative fluoroscopic images were obtained of the right femur. These images demonstrate surgical internal fixation of intertrochanteric fracture involving proximal right femur. Intramedullary rod fixation of right femur is noted as well. IMPRESSION: Fluoroscopic guidance provided during surgical internal fixation of proximal right femoral intertrochanteric fracture. Electronically Signed   By: Marijo Conception M.D.   On: 02/14/2021 12:26   1  Assessment and Plan:   Atrial fib, longstanding persistent w/ HR - Rhythm is not new, but HR is elevated in setting of pain, recent surgery, anxiety - BP limits options, is too low to increase Cardizem - she got her home dose of Cardizem 60 mg qd  this am, SBP then dropped to 87/61 - will add amio IV for now - once pt recovers from acute event, decide if amio is long-term med or not - w/ 2 recent falls, discuss if  Eliquis risk (even at reduced dose due to age, wt) is > benefits  2. Volume overload - dtr reports increased LE edema recently - +JVD and some rales on exam - SBP < 100 at  one point today (after po Cardizem given) - follow volume and decide on Lasix once BP more stable  3. Unwitnessed fall w/ hip fx - s/p surgical repair - pt has fallen at least once more recently - discuss w/ MD if risks are now > benefits of Eliquis   Risk Assessment/Risk Scores:       New York Heart Association (NYHA) Functional Class NYHA Class IV  CHA2DS2-VASc Score = 5   This indicates a 7.2% annual risk of stroke. The patient's score is based upon: CHF History: 1 HTN History: 1 Diabetes History: 0 Stroke History: 0 Vascular Disease History: 0 Age Score: 2 Gender Score: 1   For questions or updates, please contact [facility] Please consult [email or web address] for contact info under    Signed, Rosaria Ferries, PA-C  02/15/2021 2:55 PM

## 2021-02-15 NOTE — Progress Notes (Signed)
°   02/14/21 2347  Assess: MEWS Score  Temp 98.9 F (37.2 C)  BP 104/73  Pulse Rate (!) 124  Resp 20  Level of Consciousness Alert  SpO2 95 %  O2 Device Nasal Cannula  O2 Flow Rate (L/min) 3 L/min  Assess: MEWS Score  MEWS Temp 0  MEWS Systolic 0  MEWS Pulse 2  MEWS RR 0  MEWS LOC 0  MEWS Score 2  MEWS Score Color Yellow   Pt received a 250 ml bolus of LR. Will continue to monitor. Olena Heckle, NP was informed of current VS.

## 2021-02-15 NOTE — Progress Notes (Signed)
° °  Subjective:  Pain well controlled. Pending PT eval. Tolerating diet.  Objective:   VITALS:   Vitals:   02/15/21 0031 02/15/21 0140 02/15/21 0400 02/15/21 0633  BP: 100/78 112/68 94/67 (!) 122/97  Pulse: (!) 122 (!) 123 (!) 127 (!) 123  Resp: 20 18  20   Temp:  98 F (36.7 C) 98.7 F (37.1 C) 98 F (36.7 C)  TempSrc:  Oral Axillary Oral  SpO2:  95% 100% 100%  Weight:      Height:        Neurologically intact Neurovascular intact Sensation intact distally Intact pulses distally Able to DF and PF right foot   Lab Results  Component Value Date   WBC 5.7 02/15/2021   HGB 10.0 (L) 02/15/2021   HCT 30.8 (L) 02/15/2021   MCV 99.7 02/15/2021   PLT 112 (L) 02/15/2021     Assessment/Plan:  1 Day Post-Op s/p right hip CMN for IT fx, doing well  - Expected postop acute blood loss anemia - will monitor for symptoms - Patient to work with PT/OT to optimize mobilization safely - DVT ppx - SCDs, ambulation, Resume Eliquis - Postoperative Abx: Ancef x 2 additional doses given - WBAT operative extremity - Pain control - multimodal pain management, ATC acetaminophen   Kathy Massey 02/15/2021, 7:06 AM

## 2021-02-15 NOTE — Evaluation (Signed)
Physical Therapy Evaluation Patient Details Name: Kathy Massey MRN: [id number] DOB: 06/06/1927 Today's Date: 02/15/2021  History of Present Illness  Pt is a 88 y/o female s/p right hip intertrochanteric hip fixation with a nail on 02/14/2021 and is WBAT RLE. PMH includes advanced dementia, atrial fibrillation, CAD, hypertension, and lymphoma. Pt lives in a memory care unit.  Clinical Impression  Received pt semi-reclined in bed confused and disoriented. Pt unable to provide subjective history due to advanced dementia and unable to follow cues/instructions for mobility. Pt on 3L O2 via [place] with O2 sats ranging from 74%-94% and HR reaching as high as 180bpm - RN alerted and reported this is pt's baseline and MD aware. Pt required total A for bed mobility with no initiation of movement and extremely limited by pain. Pt required min A for static sitting balance and did not stand due to safety concerns. Acute PT to cont to follow.      Recommendations for follow up therapy are one component of a multi-disciplinary discharge planning process, led by the attending physician.  Recommendations may be updated based on patient status, additional functional criteria and insurance authorization.  Follow Up Recommendations Skilled nursing-short term rehab (<3 hours/day)    Assistance Recommended at Discharge Frequent or constant Supervision/Assistance  Patient can return home with the following  Other (comment) (pt currently unsafe to D/C home)    Equipment Recommendations Rolling walker (2 wheels)  Recommendations for Other Services  OT consult    Functional Status Assessment Patient has had a recent decline in their functional status and demonstrates the ability to make significant improvements in function in a reasonable and predictable amount of time.     Precautions / Restrictions Precautions Precautions: Fall Restrictions Weight Bearing Restrictions: Yes RLE Weight Bearing: Weight bearing as  tolerated      Mobility  Bed Mobility Overal bed mobility: Needs Assistance Bed Mobility: Rolling;Supine to Sit;Sit to Supine Rolling: Total assist   Supine to sit: Total assist Sit to supine: Total assist   General bed mobility comments: pt unable to initate any movement to roll, follow cues to reach for bedrail, or move LE's off bed. Pt resisting movement Patient Response: Anxious  Transfers                        Ambulation/Gait                  Stairs            Wheelchair Mobility    Modified Rankin (Stroke Patients Only)       Balance Overall balance assessment: Needs assistance Sitting-balance support: Bilateral upper extremity supported Sitting balance-Leahy Scale: Poor Sitting balance - Comments: required min A for static sitting balance due to posterior lean. Unable to scoot to edge of bed to place feet on floor. Postural control: Posterior lean                                   Pertinent Vitals/Pain Pain Assessment: Faces (pt did not understand 1-10 pain scale) Faces Pain Scale: Hurts little more (with movement) Breathing: occasional labored breathing, short period of hyperventilation Negative Vocalization: occasional moan/groan, low speech, negative/disapproving quality Facial Expression: sad, frightened, frown Body Language: tense, distressed pacing, fidgeting Consolability: distracted or reassured by voice/touch PAINAD Score: 5 Pain Location: R hip Pain Descriptors / Indicators: Discomfort;Grimacing;Crying;Guarding;Operative site guarding Pain Intervention(s): Limited activity  within patient's tolerance;Monitored during session;Premedicated before session    [place] expects to be discharged to:: Other (Comment) (memory care facility)                   Additional Comments: due to advanced dementia, pt unable to provide subjective history.    Prior Function Prior Level of Function :  Patient poor historian/Family not available                     Hand Dominance        Extremity/Trunk Assessment        Lower Extremity Assessment Lower Extremity Assessment: RLE deficits/detail;Generalized weakness;Difficult to assess due to impaired cognition RLE Deficits / Details: pt unable to follow cues/instructions RLE: Unable to fully assess due to pain;Unable to fully assess due to immobilization RLE Coordination: decreased gross motor    Cervical / Trunk Assessment Cervical / Trunk Assessment: Kyphotic  Communication   Communication: Other (comment) (unable to determine accurracy due to dementia)  Cognition   Behavior During Therapy: Anxious Overall Cognitive Status: History of cognitive impairments - at baseline                                 General Comments: required cues for orientation        General Comments General comments (skin integrity, edema, etc.): pt extremly fearful of movement    Exercises     Assessment/Plan    PT Assessment Patient needs continued PT services  PT Problem List Decreased strength;Decreased cognition;Decreased range of motion;Decreased activity tolerance;Decreased balance;Decreased mobility;Decreased coordination;Decreased knowledge of use of DME;Decreased safety awareness;Pain;Decreased skin integrity       PT Treatment Interventions DME instruction;Gait training;Stair training;Functional mobility training;Therapeutic activities;Therapeutic exercise;Balance training;Neuromuscular re-education;Cognitive remediation;Patient/family education;Wheelchair mobility training;Modalities    PT Goals (Current goals can be found in the Care Plan section)  Acute Rehab PT Goals Patient Stated Goal: "to get out of here" PT Goal Formulation: With patient Time For Goal Achievement: 03/01/21 Potential to Achieve Goals: Fair    Frequency 7X/week (QD)     Co-evaluation               AM-PAC PT "6 Clicks"  Mobility  Outcome Measure Help needed turning from your back to your side while in a flat bed without using bedrails?: Total Help needed moving from lying on your back to sitting on the side of a flat bed without using bedrails?: Total Help needed moving to and from a bed to a chair (including a wheelchair)?: Total Help needed standing up from a chair using your arms (e.g., wheelchair or bedside chair)?: Total Help needed to walk in hospital room?: Total Help needed climbing 3-5 steps with a railing? : Total 6 Click Score: 6    End of Session Equipment Utilized During Treatment: Oxygen Activity Tolerance: Patient limited by pain Patient left: in bed;with call bell/phone within reach;with bed alarm set;with SCD's reapplied Nurse Communication: Mobility status PT Visit Diagnosis: Unsteadiness on feet (R26.81);Other abnormalities of gait and mobility (R26.89);Muscle weakness (generalized) (M62.81);History of falling (Z91.81);Pain Pain - Right/Left: Right Pain - part of body: Hip    Time: 0630-1601 PT Time Calculation (min) (ACUTE ONLY): 22 min   Charges:   PT Evaluation $PT Eval Moderate Complexity: 1 Mod          Becky Sax PT, DPT  Blenda Nicely 02/15/2021, 9:18 AM

## 2021-02-15 NOTE — Plan of Care (Signed)
  Problem: Clinical Measurements: Goal: Postoperative complications will be avoided or minimized Outcome: Progressing   

## 2021-02-15 NOTE — Progress Notes (Signed)
°  Amiodarone Drug - Drug Interaction Consult Note  Recommendations:  Monitor for bradycardia, AV block, myocardial depression while on diltiazem  Monitor QTc while on ondansetron, quetiapine  Amiodarone is metabolized by the cytochrome P450 system and therefore has the potential to cause many drug interactions. Amiodarone has an average plasma half-life of 50 days (range 20 to 100 days).   There is potential for drug interactions to occur several weeks or months after stopping treatment and the onset of drug interactions may be slow after initiating amiodarone.   []  Statins: Increased risk of myopathy. Simvastatin- restrict dose to 20mg  daily. Other statins: counsel patients to report any muscle pain or weakness immediately.  []  Anticoagulants: Amiodarone can increase anticoagulant effect. Consider warfarin dose reduction. Patients should be monitored closely and the dose of anticoagulant altered accordingly, remembering that amiodarone levels take several weeks to stabilize.  []  Antiepileptics: Amiodarone can increase plasma concentration of phenytoin, the dose should be reduced. Note that small changes in phenytoin dose can result in large changes in levels. Monitor patient and counsel on signs of toxicity.  []  Beta blockers: increased risk of bradycardia, AV block and myocardial depression. Sotalol - avoid concomitant use.  [x]   Calcium channel blockers (diltiazem and verapamil): increased risk of bradycardia, AV block and myocardial depression.  []   Cyclosporine: Amiodarone increases levels of cyclosporine. Reduced dose of cyclosporine is recommended.  []  Digoxin dose should be halved when amiodarone is started.  []  Diuretics: increased risk of cardiotoxicity if hypokalemia occurs.  []  Oral hypoglycemic agents (glyburide, glipizide, glimepiride): increased risk of hypoglycemia. Patient's glucose levels should be monitored closely when initiating amiodarone therapy.   [x]  Drugs that  prolong the QT interval:  Torsades de pointes risk may be increased with concurrent use - avoid if possible.  Monitor QTc, also keep magnesium/potassium WNL if concurrent therapy can't be avoided.  Antibiotics: e.g. fluoroquinolones, erythromycin.  Antiarrhythmics: e.g. quinidine, procainamide, disopyramide, sotalol.  Antipsychotics: e.g. phenothiazines, haloperidol. quetiapine.   Lithium, tricyclic antidepressants, and methadone.  Ondansetron  Gillermina Hu, PharmD, BCPS, [facility] Clinical Pharmacist 02/15/2021 2:51 PM

## 2021-02-15 NOTE — TOC CAGE-AID Note (Signed)
Transition of Care [facility]) - CAGE-AID Screening   Patient Details  Name: Kathy Massey MRN: [id number] Date of Birth: [birth year]  Transition of Care [facility]) CM/SW Contact:    [name], [place] Phone Number: 02/15/2021, 9:06 AM   Clinical Narrative: Pt is unable to participate in Cage Aid.  Pt is not appropriate for assessment.  Orvill Coulthard Tarpley-Carter, MSW, LCSW-A Pronouns:  She/Her/Hers [name] Transitions of Care Clinical Social Worker Direct Number:  [phone] [email or web address]  CAGE-AID Screening: Substance Abuse Screening unable to be completed due to: : Patient unable to participate             Substance Abuse Education Offered: No

## 2021-02-16 ENCOUNTER — Inpatient Hospital Stay (HOSPITAL_COMMUNITY): Payer: Medicare PPO

## 2021-02-16 ENCOUNTER — Encounter (HOSPITAL_COMMUNITY): Payer: Self-pay | Admitting: Orthopaedic Surgery

## 2021-02-16 DIAGNOSIS — E8779 Other fluid overload: Secondary | ICD-10-CM | POA: Diagnosis not present

## 2021-02-16 DIAGNOSIS — S72141A Displaced intertrochanteric fracture of right femur, initial encounter for closed fracture: Secondary | ICD-10-CM | POA: Diagnosis not present

## 2021-02-16 DIAGNOSIS — I4891 Unspecified atrial fibrillation: Secondary | ICD-10-CM | POA: Diagnosis not present

## 2021-02-16 DIAGNOSIS — I4819 Other persistent atrial fibrillation: Secondary | ICD-10-CM | POA: Diagnosis not present

## 2021-02-16 DIAGNOSIS — I959 Hypotension, unspecified: Secondary | ICD-10-CM | POA: Diagnosis not present

## 2021-02-16 DIAGNOSIS — I4821 Permanent atrial fibrillation: Secondary | ICD-10-CM | POA: Diagnosis not present

## 2021-02-16 LAB — COMPREHENSIVE METABOLIC PANEL
ALT: 7 U/L (ref 0–44)
AST: 22 U/L (ref 15–41)
Albumin: 2.4 g/dL — ABNORMAL LOW (ref 3.5–5.0)
Alkaline Phosphatase: 86 U/L (ref 38–126)
Anion gap: 7 (ref 5–15)
BUN: 21 mg/dL (ref 8–23)
CO2: 25 mmol/L (ref 22–32)
Calcium: 8.2 mg/dL — ABNORMAL LOW (ref 8.9–10.3)
Chloride: 105 mmol/L (ref 98–111)
Creatinine, Ser: 1.22 mg/dL — ABNORMAL HIGH (ref 0.44–1.00)
GFR, Estimated: 42 mL/min — ABNORMAL LOW (ref >60)
Glucose, Bld: 108 mg/dL — ABNORMAL HIGH (ref 70–99)
Potassium: 3.7 mmol/L (ref 3.5–5.1)
Sodium: 137 mmol/L (ref 135–145)
Total Bilirubin: 1.5 mg/dL — ABNORMAL HIGH (ref 0.3–1.2)
Total Protein: 4.5 g/dL — ABNORMAL LOW (ref 6.5–8.1)

## 2021-02-16 LAB — CBC WITH DIFFERENTIAL/PLATELET
Abs Immature Granulocytes: 0.02 10*3/uL (ref 0.00–0.07)
Basophils Absolute: 0 10*3/uL (ref 0.0–0.1)
Basophils Relative: 1 %
Eosinophils Absolute: 0 10*3/uL (ref 0.0–0.5)
Eosinophils Relative: 1 %
HCT: 29.5 % — ABNORMAL LOW (ref 36.0–46.0)
Hemoglobin: 9.4 g/dL — ABNORMAL LOW (ref 12.0–15.0)
Immature Granulocytes: 0 %
Lymphocytes Relative: 11 %
Lymphs Abs: 0.5 10*3/uL — ABNORMAL LOW (ref 0.7–4.0)
MCH: 31.6 pg (ref 26.0–34.0)
MCHC: 31.9 g/dL (ref 30.0–36.0)
MCV: 99.3 fL (ref 80.0–100.0)
Monocytes Absolute: 0.4 10*3/uL (ref 0.1–1.0)
Monocytes Relative: 9 %
Neutro Abs: 3.6 10*3/uL (ref 1.7–7.7)
Neutrophils Relative %: 78 %
Platelets: 116 10*3/uL — ABNORMAL LOW (ref 150–400)
RBC: 2.97 MIL/uL — ABNORMAL LOW (ref 3.87–5.11)
RDW: 16.2 % — ABNORMAL HIGH (ref 11.5–15.5)
WBC: 4.6 10*3/uL (ref 4.0–10.5)
nRBC: 0 % (ref 0.0–0.2)

## 2021-02-16 LAB — MAGNESIUM: Magnesium: 2.1 mg/dL (ref 1.7–2.4)

## 2021-02-16 LAB — ECHOCARDIOGRAM COMPLETE
AR max vel: 2.02 cm2
AV Peak grad: 6.8 mmHg
Ao pk vel: 1.31 m/s
Area-P 1/2: 4.86 cm2
Calc EF: 46.7 %
Height: 59 in
MV M vel: 4.21 m/s
MV Peak grad: 70.9 mmHg
P 1/2 time: 549 msec
S' Lateral: 3.3 cm
Single Plane A2C EF: 49.6 %
Single Plane A4C EF: 47.6 %
Weight: 1908.3 oz

## 2021-02-16 LAB — PHOSPHORUS: Phosphorus: 3.8 mg/dL (ref 2.5–4.6)

## 2021-02-16 MED ORDER — AMIODARONE HCL IN DEXTROSE 360-4.14 MG/200ML-% IV SOLN
30.0000 mg/h | INTRAVENOUS | Status: DC
  Administered 2021-02-17 – 2021-02-18 (×4): 30 mg/h via INTRAVENOUS
  Filled 2021-02-16 (×6): qty 200

## 2021-02-16 MED ORDER — MIDODRINE HCL 5 MG PO TABS
10.0000 mg | ORAL_TABLET | Freq: Three times a day (TID) | ORAL | Status: DC
  Administered 2021-02-16 – 2021-02-23 (×21): 10 mg via ORAL
  Filled 2021-02-16 (×22): qty 2

## 2021-02-16 MED ORDER — MIDODRINE HCL 5 MG PO TABS
2.5000 mg | ORAL_TABLET | Freq: Three times a day (TID) | ORAL | Status: DC
  Administered 2021-02-16 (×2): 2.5 mg via ORAL
  Filled 2021-02-16 (×2): qty 1

## 2021-02-16 MED ORDER — AMIODARONE HCL IN DEXTROSE 360-4.14 MG/200ML-% IV SOLN
60.0000 mg/h | INTRAVENOUS | Status: AC
  Administered 2021-02-16: 60 mg/h via INTRAVENOUS

## 2021-02-16 MED ORDER — PERFLUTREN LIPID MICROSPHERE
1.0000 mL | INTRAVENOUS | Status: AC | PRN
  Administered 2021-02-16: 2 mL via INTRAVENOUS
  Filled 2021-02-16: qty 10

## 2021-02-16 MED ORDER — MIDODRINE HCL 5 MG PO TABS
10.0000 mg | ORAL_TABLET | Freq: Three times a day (TID) | ORAL | Status: DC
  Administered 2021-02-16: 10 mg via ORAL
  Filled 2021-02-16: qty 2

## 2021-02-16 NOTE — Progress Notes (Addendum)
PROGRESS NOTE  Kathy Massey JKK:[id number] DOB: August 20, 1927 DOA: 02/13/2021 PCP: [name], MD   LOS: 3 days   Brief Narrative / Interim history: 88 year old female with history of A. fib on Eliquis, CAD, HTN, dementia, history of lymphoma who came into the hospital and was admitted on 02/13/2021 after a ground-level fall.  She was found on the floor during the morning hours, could not recall what happened.  She was found to have a hip fracture on the right side and orthopedics was consulted, she is status post right hip intertrochanteric fixation with nail on 1/2.  Hospital course complicated by A. fib with RVR with rates into the 150s for which cardiology was consulted.  Due to persistent hypotension with diltiazem as well as amiodarone she was transferred to stepdown on 1/4.  Subjective / 24h Interval events: She is sleepy this morning, blood pressure overnight 71I-96V systolic.  She is off amiodarone this morning due to hypotension overnight.  Underlying dementia.   Assessment & Plan: Principal Problem Persistent A. fib with RVR, hypotension - Main issue currently, appreciate cardiology consultation.  Her hypotension complicates the picture, could not use diltiazem.  She was placed on amiodarone infusion however she became hypotensive with that.  Blood pressure this morning in the 89F systolic, she is rate controlled into the 1 teens.  Due to persistent hypotension start low-dose midodrine this morning, can uptitrate based on response.  Active Problems Right hip fracture-due to mechanical fall, orthopedic surgery consulted and she is status post nail intertrochanteric hip fixation.  Per Ortho, weightbearing as tolerated.  Needs SNF once acute issues improve.  Anticoagulated with Eliquis  Hyperglycemia-no known diabetes.  Likely transient in the setting of acute stress.  Monitor CBGs with morning labs, currently with decent control  Chronic kidney disease stage IIIa-Baseline creatinine  1.1-1.2, currently at baseline  Acute on chronic diastolic CHF-repeat 2D echo pending, she has evidence of fluid overload with cardiomegaly, elevated BNP, tiny bilateral pleural effusions.  Hypotension precludes diuresis at this point.  Appreciate cardiology follow-up  Underlying dementia-mixed Alzheimer and vascular.  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 She was treated with acalabrutinib for recurrent disease, stopped due to toxicity. No obvious evidence of recurrence, followed by Dr. Marin Olp   Acute postoperative blood loss anemia superimposed on normocytic anemia/anemia of chronic disease -monitor, hemoglobin stable  Thrombocytopenia -Likely consumptive, platelets overall stable and gradually improving  Scheduled Meds:  apixaban  2.5 mg Oral BID   Chlorhexidine Gluconate Cloth  6 each Topical Daily   docusate sodium  100 mg Oral BID   feeding supplement  237 mL Oral BID BM   mouth rinse  15 mL Mouth Rinse BID   midodrine  2.5 mg Oral TID WC   multivitamin with minerals  1 tablet Oral Daily   QUEtiapine  25 mg Oral QHS   Continuous Infusions:  amiodarone Stopped (02/16/21 0422)   Followed by   amiodarone     methocarbamol (ROBAXIN) IV     PRN Meds:.acetaminophen, bisacodyl, methocarbamol **OR** methocarbamol (ROBAXIN) IV, morphine injection, ondansetron (ZOFRAN) IV, oxyCODONE, polyethylene glycol  Diet Orders (From admission, onward)     Start     Ordered   02/14/21 1307  Diet regular Room service appropriate? Yes; Fluid consistency: Thin  Diet effective now  Question Answer Comment  Room service appropriate? Yes   Fluid consistency: Thin      02/14/21 1306            DVT prophylaxis: apixaban (ELIQUIS) tablet 2.5 mg Start: 02/15/21  1115 SCDs Start: 02/13/21 1219 apixaban (ELIQUIS) tablet 2.5 mg     Code Status: DNR  Family Communication: no family at bedside   Status is: Inpatient  Remains inpatient appropriate because: A. fib with RVR, hypotension  Level of care: Progressive  Consultants:  Cardiology  Orthopedic surgery   Procedures:  2D echo: pending  Microbiology  none  Antimicrobials: none    Objective: Vitals:   02/16/21 0500 02/16/21 0520 02/16/21 0600 02/16/21 0732  BP:   (!) 80/54 93/67  Pulse: (!) 112   (!) 122  Resp: 20 20 20 20   Temp:    98 F (36.7 C)  TempSrc:    Oral  SpO2: 93%  94% 99%  Weight:      Height:        Intake/Output Summary (Last 24 hours) at 02/16/2021 1019 Last data filed at 02/16/2021 0211 Gross per 24 hour  Intake 170 ml  Output 250 ml  Net -80 ml   Filed Weights   02/13/21 0855 02/14/21 0911 02/16/21 0336  Weight: 49.9 kg 49.9 kg 54.1 kg    Examination:  Constitutional: Lethargic this morning Eyes: no scleral icterus ENMT: Mucous membranes are moist.  Neck: normal, supple Respiratory: Faint bibasilar crackles, no wheezing, diminished at the bases Cardiovascular: Irregular, tachycardic, trace edema Abdomen: non distended, no tenderness. Bowel sounds positive.  Musculoskeletal: no clubbing / cyanosis.  Skin: no rashes Neurologic: No apparent focal deficits but does not follow commands consistently   Data Reviewed: I have independently reviewed following labs and imaging studies   CBC: Recent Labs  Lab 02/13/21 0716 02/14/21 0309 02/15/21 0352 02/16/21 0217  WBC 8.6 6.4 5.7 4.6  NEUTROABS  --   --  4.5 3.6  HGB 11.6* 11.7* 10.0* 9.4*  HCT 36.2 37.1 30.8* 29.5*  MCV 98.4 98.9 99.7 99.3  PLT 156 141* 112* 573*   Basic Metabolic Panel: Recent Labs  Lab 02/13/21 0716 02/14/21 0309 02/15/21 0352 02/16/21 0217  NA 140 142 138 137  K 3.7 4.1 3.8 3.7  CL 108 106 106 105  CO2 23 28 24 25   GLUCOSE 121* 131* 95 108*  BUN 16 16 16 21    CREATININE 1.13* 1.23* 1.22* 1.22*  CALCIUM 8.7* 8.8* 8.0* 8.2*  MG  --   --  2.0 2.1  PHOS  --   --  3.6 3.8   Liver Function Tests: Recent Labs  Lab 02/15/21 0352 02/16/21 0217  AST 25 22  ALT 9 7  ALKPHOS 84 86  BILITOT 1.2 1.5*  PROT 4.6* 4.5*  ALBUMIN 2.6* 2.4*   Coagulation Profile: No results for input(s): INR, PROTIME in the last 168 hours. HbA1C: No results for input(s): HGBA1C in the last 72 hours. CBG: No results for input(s): GLUCAP in the last 168 hours.  Recent Results (from the past 240 hour(s))  Resp Panel by RT-PCR (Flu A&B, Covid) Nasopharyngeal Swab     Status: None   Collection Time: 02/13/21 11:44 AM   Specimen: Nasopharyngeal Swab; Nasopharyngeal(NP) swabs in vial transport medium  Result Value Ref Range Status   SARS Coronavirus 2 by RT PCR NEGATIVE NEGATIVE Final    Comment: (NOTE) SARS-CoV-2 target nucleic acids are NOT DETECTED.  The SARS-CoV-2 RNA is generally detectable  in upper respiratory specimens during the acute phase of infection. The lowest concentration of SARS-CoV-2 viral copies this assay can detect is 138 copies/mL. A negative result does not preclude SARS-Cov-2 infection and should not be used as the sole basis for treatment or other patient management decisions. A negative result may occur with  improper specimen collection/handling, submission of specimen other than nasopharyngeal swab, presence of viral mutation(s) within the areas targeted by this assay, and inadequate number of viral copies(<138 copies/mL). A negative result must be combined with clinical observations, patient history, and epidemiological information. The expected result is Negative.  Fact Sheet for Patients:  EntrepreneurPulse.com.au  Fact Sheet for Healthcare Providers:  IncredibleEmployment.be  This test is no t yet approved or cleared by the [place] FDA and  has been authorized for detection and/or  diagnosis of SARS-CoV-2 by FDA under an Emergency Use Authorization (EUA). This EUA will remain  in effect (meaning this test can be used) for the duration of the COVID-19 declaration under Section 564(b)(1) of the Act, 21 U.S.C.section 360bbb-3(b)(1), unless the authorization is terminated  or revoked sooner.       Influenza A by PCR NEGATIVE NEGATIVE Final   Influenza B by PCR NEGATIVE NEGATIVE Final    Comment: (NOTE) The Xpert Xpress SARS-CoV-2/FLU/RSV plus assay is intended as an aid in the diagnosis of influenza from Nasopharyngeal swab specimens and should not be used as a sole basis for treatment. Nasal washings and aspirates are unacceptable for Xpert Xpress SARS-CoV-2/FLU/RSV testing.  Fact Sheet for Patients: EntrepreneurPulse.com.au  Fact Sheet for Healthcare Providers: IncredibleEmployment.be  This test is not yet approved or cleared by the [place] FDA and has been authorized for detection and/or diagnosis of SARS-CoV-2 by FDA under an Emergency Use Authorization (EUA). This EUA will remain in effect (meaning this test can be used) for the duration of the COVID-19 declaration under Section 564(b)(1) of the Act, 21 U.S.C. section 360bbb-3(b)(1), unless the authorization is terminated or revoked.  Performed at [facility], [place] [address], [place], [place] [address]   Surgical pcr screen     Status: None   Collection Time: 02/13/21  8:24 PM   Specimen: Nasal Mucosa; Nasal Swab  Result Value Ref Range Status   MRSA, PCR NEGATIVE NEGATIVE Final   Staphylococcus aureus NEGATIVE NEGATIVE Final    Comment: (NOTE) The Xpert SA Assay (FDA approved for NASAL specimens in patients 7 years of age and older), is one component of a comprehensive surveillance program. It is not intended to diagnose infection nor to guide or monitor treatment. Performed at [facility], [place] [address], [place], [place] [address]       Radiology Studies: DG CHEST PORT 1 VIEW  Result Date: 02/16/2021 CLINICAL DATA:  Shortness of breath. EXAM: PORTABLE CHEST 1 VIEW COMPARISON:  CT chest 12/04/2017. FINDINGS: Prominent cardiomegaly. No pulmonary venous congestion. Low lung volumes with mild bibasilar atelectasis. Tiny bilateral pleural effusions cannot be excluded. No pneumothorax. Mild thoracic spine scoliosis. Distal left clavicle fracture, age undetermined. IMPRESSION: 1.  Prominent cardiomegaly, no pulmonary venous congestion. 2. Low lung volumes with mild bibasilar atelectasis. Tiny bilateral pleural effusions cannot be excluded. 3.  Distal left clavicle fracture, age undetermined. Electronically Signed   By: Marcello Moores  Register M.D.   On: 02/16/2021 06:30      Marzetta Board, MD, PhD [facility]  Between 7 am - 7 pm I am available, please contact me via Amion (for emergencies) or Securechat (non urgent  messages)  Between 7 pm - 7 am I am not available, please contact night coverage MD/APP via Amion

## 2021-02-16 NOTE — Progress Notes (Signed)
Progress Note  Patient Name: Kathy Massey Date of Encounter: 02/16/2021  [facility] Cardiologist: None Dr. Curt Bears   Subjective   Overnight hypotension following addition of amiodarone leading to transfer to step down unit. Off amiodarone. Rates in 100-110s. Patient is with dementia and sleepy.  Inpatient Medications    Scheduled Meds:  apixaban  2.5 mg Oral BID   Chlorhexidine Gluconate Cloth  6 each Topical Daily   docusate sodium  100 mg Oral BID   feeding supplement  237 mL Oral BID BM   mouth rinse  15 mL Mouth Rinse BID   midodrine  2.5 mg Oral TID WC   multivitamin with minerals  1 tablet Oral Daily   QUEtiapine  25 mg Oral QHS   Continuous Infusions:  amiodarone Stopped (02/16/21 0422)   Followed by   amiodarone     methocarbamol (ROBAXIN) IV     PRN Meds: acetaminophen, bisacodyl, methocarbamol **OR** methocarbamol (ROBAXIN) IV, morphine injection, ondansetron (ZOFRAN) IV, oxyCODONE, polyethylene glycol   Vital Signs    Vitals:   02/16/21 0500 02/16/21 0520 02/16/21 0600 02/16/21 0732  BP:   (!) 80/54 93/67  Pulse: (!) 112   (!) 122  Resp: 20 20 20 20   Temp:    98 F (36.7 C)  TempSrc:    Oral  SpO2: 93%  94% 99%  Weight:      Height:        Intake/Output Summary (Last 24 hours) at 02/16/2021 0811 Last data filed at 02/16/2021 0211 Gross per 24 hour  Intake 410 ml  Output 550 ml  Net -140 ml   Last 3 Weights 02/16/2021 02/14/2021 02/13/2021  Weight (lbs) 119 lb 4.3 oz 110 lb 110 lb  Weight (kg) 54.1 kg 49.896 kg 49.896 kg      Telemetry    Atrial fibrillation at rate of 100-100s - Personally Reviewed  ECG    N/A  Physical Exam   GEN: Thin frail chronically ill appearing female in no acute distress.   Neck: +JVD Cardiac: Ir IR tachycardic, no murmurs, rubs, or gallops.  Respiratory: Seems course breath on anterior lung exam  GI: Soft, nontender, non-distended  YF:VCBSW edema; No deformity. Neuro:  Nonfocal  Psych: Drowsy, dementia    Labs    High Sensitivity Troponin:  No results for input(s): TROPONINIHS in the last 720 hours.   Chemistry Recent Labs  Lab 02/14/21 0309 02/15/21 0352 02/16/21 0217  NA 142 138 137  K 4.1 3.8 3.7  CL 106 106 105  CO2 28 24 25   GLUCOSE 131* 95 108*  BUN 16 16 21   CREATININE 1.23* 1.22* 1.22*  CALCIUM 8.8* 8.0* 8.2*  MG  --  2.0 2.1  PROT  --  4.6* 4.5*  ALBUMIN  --  2.6* 2.4*  AST  --  25 22  ALT  --  9 7  ALKPHOS  --  84 86  BILITOT  --  1.2 1.5*  GFRNONAA 41* 42* 42*  ANIONGAP 8 8 7     Lipids No results for input(s): CHOL, TRIG, HDL, LABVLDL, LDLCALC, CHOLHDL in the last 168 hours.  Hematology Recent Labs  Lab 02/14/21 0309 02/15/21 0352 02/16/21 0217  WBC 6.4 5.7 4.6  RBC 3.75* 3.09*   3.12* 2.97*  HGB 11.7* 10.0* 9.4*  HCT 37.1 30.8* 29.5*  MCV 98.9 99.7 99.3  MCH 31.2 32.4 31.6  MCHC 31.5 32.5 31.9  RDW 16.8* 16.6* 16.2*  PLT 141* 112* 116*  Thyroid No results for input(s): TSH, FREET4 in the last 168 hours.  BNP Recent Labs  Lab 02/15/21 1554  BNP 227.9*    DDimer No results for input(s): DDIMER in the last 168 hours.   Radiology    DG CHEST PORT 1 VIEW  Result Date: 02/16/2021 CLINICAL DATA:  Shortness of breath. EXAM: PORTABLE CHEST 1 VIEW COMPARISON:  CT chest 12/04/2017. FINDINGS: Prominent cardiomegaly. No pulmonary venous congestion. Low lung volumes with mild bibasilar atelectasis. Tiny bilateral pleural effusions cannot be excluded. No pneumothorax. Mild thoracic spine scoliosis. Distal left clavicle fracture, age undetermined. IMPRESSION: 1.  Prominent cardiomegaly, no pulmonary venous congestion. 2. Low lung volumes with mild bibasilar atelectasis. Tiny bilateral pleural effusions cannot be excluded. 3.  Distal left clavicle fracture, age undetermined. Electronically Signed   By: Marcello Moores  Register M.D.   On: 02/16/2021 06:30   DG C-Arm 1-60 Min-No Report  Result Date: 02/14/2021 CLINICAL DATA:  Intramedullary rod fixation of right femur  fracture. EXAM: RIGHT FEMUR 2 VIEWS; DG C-ARM 1-60 MIN-NO REPORT Radiation exposure index: 7.05 mGy. COMPARISON:  February 13, 2021. FINDINGS: Six intraoperative fluoroscopic images were obtained of the right femur. These images demonstrate surgical internal fixation of intertrochanteric fracture involving proximal right femur. Intramedullary rod fixation of right femur is noted as well. IMPRESSION: Fluoroscopic guidance provided during surgical internal fixation of proximal right femoral intertrochanteric fracture. Electronically Signed   By: Marijo Conception M.D.   On: 02/14/2021 12:26   DG C-Arm 1-60 Min-No Report  Result Date: 02/14/2021 CLINICAL DATA:  Intramedullary rod fixation of right femur fracture. EXAM: RIGHT FEMUR 2 VIEWS; DG C-ARM 1-60 MIN-NO REPORT Radiation exposure index: 7.05 mGy. COMPARISON:  February 13, 2021. FINDINGS: Six intraoperative fluoroscopic images were obtained of the right femur. These images demonstrate surgical internal fixation of intertrochanteric fracture involving proximal right femur. Intramedullary rod fixation of right femur is noted as well. IMPRESSION: Fluoroscopic guidance provided during surgical internal fixation of proximal right femoral intertrochanteric fracture. Electronically Signed   By: Marijo Conception M.D.   On: 02/14/2021 12:26   DG FEMUR, MIN 2 VIEWS RIGHT  Result Date: 02/14/2021 CLINICAL DATA:  Intramedullary rod fixation of right femur fracture. EXAM: RIGHT FEMUR 2 VIEWS; DG C-ARM 1-60 MIN-NO REPORT Radiation exposure index: 7.05 mGy. COMPARISON:  February 13, 2021. FINDINGS: Six intraoperative fluoroscopic images were obtained of the right femur. These images demonstrate surgical internal fixation of intertrochanteric fracture involving proximal right femur. Intramedullary rod fixation of right femur is noted as well. IMPRESSION: Fluoroscopic guidance provided during surgical internal fixation of proximal right femoral intertrochanteric fracture.  Electronically Signed   By: Marijo Conception M.D.   On: 02/14/2021 12:26    Cardiac Studies   Pending echo   Patient Profile     88 y.o. female  with a hx of dementia, Afib w/ rate control strategy on Eliquis, MI 2017 felt 2nd stress induced w/ apical WMA at cath & no CAD, non-Hodgkin's lymphoma, IDA, osteoporosis seen for afib rvr.   Assessment & Plan    Post op afib RVR - Long standing hx of afib with rate control strategy. Cardiology is consulted for elevated heart rate - Unable to tolerate rate neither cardizem nor  amiodarone (off both) - Thankfully heart rate stable at 110s - On Eliquis 2.5mg  BID (appropriate dose)  - Pending echo - Hopefully rate will improved as she recovers from surgery. Expect elevated heart rate with PT/OT  2. Volume overload - BNP 227 -  Unable to give diuretics given soft blood pressure - On midodrine >> consider titration   3. Right hip fracture s/p surgery 4. Post op anemia   For questions or updates, please contact [facility] Please consult [email or web address] for contact info under        SignedLeanor Kail, PA  02/16/2021, 8:11 AM

## 2021-02-16 NOTE — Progress Notes (Signed)
° °  Subjective:  Hypotensive overnight, transferred to medical stepdown. Pain controlled this AM. Pending stabilization of BP prior to PT.  Objective:   VITALS:   Vitals:   02/16/21 0500 02/16/21 0520 02/16/21 0600 02/16/21 0732  BP:   (!) 80/54 93/67  Pulse: (!) 112   (!) 122  Resp: 20 20 20 20   Temp:    98 F (36.7 C)  TempSrc:    Oral  SpO2: 93%  94% 99%  Weight:      Height:        Neurologically intact Neurovascular intact Sensation intact distally Intact pulses distally Able to DF and PF right foot   Lab Results  Component Value Date   WBC 4.6 02/16/2021   HGB 9.4 (L) 02/16/2021   HCT 29.5 (L) 02/16/2021   MCV 99.3 02/16/2021   PLT 116 (L) 02/16/2021     Assessment/Plan:  2 Days Post-Op s/p right hip CMN for IT fx, doing well  - Expected postop acute blood loss anemia - Patient to work with PT/OT to optimize mobilization safely - DVT ppx - SCDs, ambulation, Resume Eliquis - Postoperative Abx: Ancef x 2 additional doses given - WBAT operative extremity - Pain control - multimodal pain management, ATC acetaminophen   Kathy Massey 02/16/2021, 7:36 AM

## 2021-02-16 NOTE — Progress Notes (Signed)
TRH night cross cover note:  I was notified by RN that this patient, who was recently transferred from [facility], has had a slight interval decline in her systolic blood pressure from the 90s to the 70s mmHg corresponding with the timing of initiation of amiodarone drip.  Cardiology has evaluated the patient and recommend holding the amiodarone drip for hypotension.  Consequently, I have updated existing amiodarone drip to reflect hold parameters for systolic blood pressure less than 90 mmHg, and have asked to be notified if ensuing blood pressure does not increase following holding of the amiodarone drip. Remains in Afib, currently rate controlled w/ HR's in the 110's bpm. The patient has a history of dementia, and appears to be at her demented baseline in terms of mental status evaluation, and is currently without any acute complaints.   Babs Bertin, DO Hospitalist

## 2021-02-16 NOTE — Progress Notes (Signed)
Patient transferred from [name] accompanied by RN to [name] [facility].Attached to cardiac monitoring CCMD notified, CHG wipes done,V/S checked.Patient on Atrial fibrillation with HR ranging from 110 to 120 per minute..Will continue to monitor the patient.Kathy Massey

## 2021-02-16 NOTE — Progress Notes (Signed)
Patient would not wake up enough to safely give her 22:00 Eliquis and Colace. She took Seroquel 25 mg at 18:25 this evening so she is now very sleepy.

## 2021-02-16 NOTE — Progress Notes (Signed)
Pt transferred to [place] .

## 2021-02-16 NOTE — Progress Notes (Signed)
Amiodarone drip held, systolic blood pressure ranging from 70s to 80s. Dr Quincy Sheehan hospitalist on call is aware.

## 2021-02-16 NOTE — Progress Notes (Signed)
Physical Therapy Treatment Patient Details Name: Kathy Massey MRN: [id number] DOB: 08/25/1927 Today's Date: 02/16/2021   History of Present Illness Pt is a 88 y/o female s/p right hip intertrochanteric hip fixation with a nail on 02/14/2021 and is WBAT RLE. PMH includes advanced dementia, atrial fibrillation, CAD, hypertension, and lymphoma. Pt lives in a memory care unit.    PT Comments    Pt received in bed, awake/alert with baseline dementia. BP in supine 95/71. She required total assist supine to sit. Pt sat EOB x 8 minutes with min/min guard assist. BP in sitting 101/69. Total assist sit to supine. +2 total assist for scooting up in bed. Total assist rolling R/L for bed pad change. Pt less resistive to mobility today.    Recommendations for follow up therapy are one component of a multi-disciplinary discharge planning process, led by the attending physician.  Recommendations may be updated based on patient status, additional functional criteria and insurance authorization.  Follow Up Recommendations  Skilled nursing-short term rehab (<3 hours/day)     Assistance Recommended at Discharge Frequent or constant Supervision/Assistance  Patient can return home with the following Other (comment) (from ALF memory care)   Equipment Recommendations  Other (comment) (defer to next venue)    Recommendations for Other Services       Precautions / Restrictions Precautions Precautions: Fall;Other (comment) Precaution Comments: watch BP and HR Restrictions RLE Weight Bearing: Weight bearing as tolerated     Mobility  Bed Mobility Overal bed mobility: Needs Assistance Bed Mobility: Rolling;Supine to Sit;Sit to Supine Rolling: Total assist   Supine to sit: Total assist Sit to supine: Total assist   General bed mobility comments: resisting movement when transitioning supine to sit. No resistance noted with sit to supine.    Transfers                   General transfer  comment: unable due to pain    Ambulation/Gait                   Stairs             Wheelchair Mobility    Modified Rankin (Stroke Patients Only)       Balance Overall balance assessment: Needs assistance Sitting-balance support: Bilateral upper extremity supported;Feet supported Sitting balance-Leahy Scale: Poor Sitting balance - Comments: Pt sat EOB x 8 minutes, min to min guard assist statically Postural control: Posterior lean                                  Cognition Arousal/Alertness: Awake/alert Behavior During Therapy: Anxious Overall Cognitive Status: History of cognitive impairments - at baseline                                 General Comments: advanced dementia at baseline. Resides in memory care unit.        Exercises      General Comments General comments (skin integrity, edema, etc.): HR into 130s with mobility. BP in supine 95/71. BP in sitting 101/69. SpO2 stable on 3L      Pertinent Vitals/Pain Pain Assessment: Faces Faces Pain Scale: Hurts whole lot Pain Location: RLE Pain Descriptors / Indicators: Discomfort;Grimacing;Crying;Operative site guarding;Moaning Pain Intervention(s): Monitored during session;Repositioned;Patient requesting pain meds-RN notified    Home Living  Prior Function            PT Goals (current goals can now be found in the care plan section) Acute Rehab PT Goals Patient Stated Goal: return to [facility] per daughter and son Progress towards PT goals: Progressing toward goals    Frequency    Min 3X/week      PT Plan Frequency needs to be updated    Co-evaluation              AM-PAC PT "6 Clicks" Mobility   Outcome Measure  Help needed turning from your back to your side while in a flat bed without using bedrails?: Total Help needed moving from lying on your back to sitting on the side of a flat bed without using bedrails?:  Total Help needed moving to and from a bed to a chair (including a wheelchair)?: Total Help needed standing up from a chair using your arms (e.g., wheelchair or bedside chair)?: Total Help needed to walk in hospital room?: Total Help needed climbing 3-5 steps with a railing? : Total 6 Click Score: 6    End of Session Equipment Utilized During Treatment: Oxygen Activity Tolerance: Patient limited by pain Patient left: in bed;with call bell/phone within reach;with bed alarm set;with family/visitor present Nurse Communication: Mobility status PT Visit Diagnosis: Unsteadiness on feet (R26.81);Other abnormalities of gait and mobility (R26.89);Muscle weakness (generalized) (M62.81);History of falling (Z91.81);Pain Pain - Right/Left: Right Pain - part of body: Hip     Time: 2563-8937 PT Time Calculation (min) (ACUTE ONLY): 25 min  Charges:  $Therapeutic Activity: 23-37 mins                     Lorrin Goodell, PT  Office # [phone] Pager [phone]    Lorriane Shire 02/16/2021, 12:28 PM

## 2021-02-17 DIAGNOSIS — I4819 Other persistent atrial fibrillation: Secondary | ICD-10-CM | POA: Diagnosis not present

## 2021-02-17 DIAGNOSIS — I4821 Permanent atrial fibrillation: Secondary | ICD-10-CM | POA: Diagnosis not present

## 2021-02-17 DIAGNOSIS — I4891 Unspecified atrial fibrillation: Secondary | ICD-10-CM | POA: Diagnosis not present

## 2021-02-17 DIAGNOSIS — S72141A Displaced intertrochanteric fracture of right femur, initial encounter for closed fracture: Secondary | ICD-10-CM | POA: Diagnosis not present

## 2021-02-17 DIAGNOSIS — E8779 Other fluid overload: Secondary | ICD-10-CM | POA: Diagnosis not present

## 2021-02-17 LAB — COMPREHENSIVE METABOLIC PANEL
ALT: 6 U/L (ref 0–44)
AST: 20 U/L (ref 15–41)
Albumin: 2.4 g/dL — ABNORMAL LOW (ref 3.5–5.0)
Alkaline Phosphatase: 88 U/L (ref 38–126)
Anion gap: 4 — ABNORMAL LOW (ref 5–15)
BUN: 23 mg/dL (ref 8–23)
CO2: 26 mmol/L (ref 22–32)
Calcium: 8.3 mg/dL — ABNORMAL LOW (ref 8.9–10.3)
Chloride: 105 mmol/L (ref 98–111)
Creatinine, Ser: 1.16 mg/dL — ABNORMAL HIGH (ref 0.44–1.00)
GFR, Estimated: 45 mL/min — ABNORMAL LOW (ref >60)
Glucose, Bld: 91 mg/dL (ref 70–99)
Potassium: 4.2 mmol/L (ref 3.5–5.1)
Sodium: 135 mmol/L (ref 135–145)
Total Bilirubin: 1.1 mg/dL (ref 0.3–1.2)
Total Protein: 4.7 g/dL — ABNORMAL LOW (ref 6.5–8.1)

## 2021-02-17 LAB — CBC
HCT: 30.3 % — ABNORMAL LOW (ref 36.0–46.0)
Hemoglobin: 9.4 g/dL — ABNORMAL LOW (ref 12.0–15.0)
MCH: 31.3 pg (ref 26.0–34.0)
MCHC: 31 g/dL (ref 30.0–36.0)
MCV: 101 fL — ABNORMAL HIGH (ref 80.0–100.0)
Platelets: 140 10*3/uL — ABNORMAL LOW (ref 150–400)
RBC: 3 MIL/uL — ABNORMAL LOW (ref 3.87–5.11)
RDW: 16.1 % — ABNORMAL HIGH (ref 11.5–15.5)
WBC: 5.1 10*3/uL (ref 4.0–10.5)
nRBC: 0 % (ref 0.0–0.2)

## 2021-02-17 LAB — PHOSPHORUS: Phosphorus: 3.5 mg/dL (ref 2.5–4.6)

## 2021-02-17 LAB — CORTISOL-AM, BLOOD: Cortisol - AM: 18.1 ug/dL (ref 6.7–22.6)

## 2021-02-17 LAB — MAGNESIUM: Magnesium: 2.1 mg/dL (ref 1.7–2.4)

## 2021-02-17 MED ORDER — QUETIAPINE FUMARATE 25 MG PO TABS
12.5000 mg | ORAL_TABLET | Freq: Every day | ORAL | Status: DC
  Administered 2021-02-17 – 2021-02-18 (×2): 12.5 mg via ORAL
  Filled 2021-02-17 (×2): qty 1

## 2021-02-17 MED ORDER — FUROSEMIDE 10 MG/ML IJ SOLN
40.0000 mg | Freq: Once | INTRAMUSCULAR | Status: AC
  Administered 2021-02-17: 40 mg via INTRAVENOUS
  Filled 2021-02-17: qty 4

## 2021-02-17 NOTE — Progress Notes (Signed)
PROGRESS NOTE  Kathy Massey YQM:[id number] DOB: 07-14-1927 DOA: 02/13/2021 PCP: [name], MD   LOS: 4 days   Brief Narrative / Interim history: 88 year old female with history of A. fib on Eliquis, CAD, HTN, dementia, history of lymphoma who came into the hospital and was admitted on 02/13/2021 after a ground-level fall.  She was found on the floor during the morning hours, could not recall what happened.  She was found to have a hip fracture on the right side and orthopedics was consulted, she is status post right hip intertrochanteric fixation with nail on 1/2.  Hospital course complicated by A. fib with RVR with rates into the 150s for which cardiology was consulted.  Due to persistent hypotension with diltiazem as well as amiodarone she was transferred to stepdown on 1/4.  Subjective / 24h Interval events: Appears confused this morning.  Picking at the bed sheets and tries to eat them.  Complains of pain all over including her legs, headache, chest  Assessment & Plan: Principal Problem Persistent A. fib with RVR, hypotension - Main issue currently, appreciate cardiology consultation.  Her hypotension complicates the picture, could not use diltiazem.  She was placed on amiodarone infusion however she became hypotensive with that.  Started on midodrine, currently at max dose of 10 mg 3 times daily.  Blood pressure little bit better, attempt amiodarone can  Active Problems Right hip fracture-due to mechanical fall, orthopedic surgery consulted and she is status post nail intertrochanteric hip fixation.  Per Ortho, weightbearing as tolerated.  Needs SNF once acute issues improve.  Anticoagulated with Eliquis  Hyperglycemia-no known diabetes.  Likely transient in the setting of acute stress.  Continue to monitor CBGs  Chronic kidney disease stage IIIa-Baseline creatinine 1.1-1.2, at baseline this morning  Acute on chronic systolic and diastolic CHF-repeat 2D echo shows EF 30-35%,  moderately decreased LV function.  LV has global hypokinesis.  Upon improvement in her heart rate and blood pressure she will need diuresis as clinically she looks fluid overloaded  Underlying dementia-mixed Alzheimer and vascular.  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 She was treated with acalabrutinib for recurrent disease, stopped due to toxicity. No obvious evidence of recurrence, followed by Dr. Marin Olp   Acute postoperative blood loss anemia superimposed on normocytic anemia/anemia of chronic disease -monitor, hemoglobin remained stable  Thrombocytopenia -Likely consumptive, platelets overall stable and continue to improve today  Scheduled Meds:  apixaban  2.5 mg Oral BID   Chlorhexidine Gluconate Cloth  6 each Topical Daily   docusate sodium  100 mg Oral BID   feeding supplement  237 mL Oral BID BM   furosemide  40 mg Intravenous Once   mouth rinse  15 mL Mouth Rinse BID   midodrine  10 mg Oral TID WC   multivitamin with minerals  1 tablet Oral Daily   QUEtiapine  12.5 mg Oral QHS   Continuous Infusions:  amiodarone Stopped (02/16/21 1030)   methocarbamol (ROBAXIN) IV     PRN Meds:.acetaminophen, bisacodyl, methocarbamol **OR** methocarbamol (ROBAXIN) IV, morphine injection, ondansetron (ZOFRAN) IV, oxyCODONE, polyethylene glycol  Diet Orders (From admission, onward)     Start     Ordered   02/14/21 1307  Diet regular Room service appropriate? Yes; Fluid consistency: Thin  Diet effective now       Question  Answer Comment  Room service appropriate? Yes   Fluid consistency: Thin      02/14/21 1306            DVT prophylaxis: apixaban (ELIQUIS) tablet 2.5 mg Start: 02/15/21 1115 SCDs Start: 02/13/21 1219 apixaban (ELIQUIS) tablet 2.5 mg      Code Status: DNR  Family Communication: Son present at bedside  Status is: Inpatient  Remains inpatient appropriate because: A. fib with RVR, hypotension  Level of care: Progressive  Consultants:  Cardiology  Orthopedic surgery   Procedures:  2D echo: pending  Microbiology  none  Antimicrobials: none    Objective: Vitals:   02/17/21 0407 02/17/21 0409 02/17/21 0736 02/17/21 0741  BP: 99/78 105/82 119/79 111/88  Pulse: (!) 124 (!) 120 84 80  Resp: (!) 24 20 (!) 22 17  Temp: 98.7 F (37.1 C) 98.7 F (37.1 C) 98.1 F (36.7 C)   TempSrc: Oral Oral Oral   SpO2: 95% 100% 97% 99%  Weight:      Height:        Intake/Output Summary (Last 24 hours) at 02/17/2021 1056 Last data filed at 02/16/2021 1831 Gross per 24 hour  Intake 60 ml  Output 200 ml  Net -140 ml    Filed Weights   02/13/21 0855 02/14/21 0911 02/16/21 0336  Weight: 49.9 kg 49.9 kg 54.1 kg    Examination:  Constitutional: Confused, no apparent distress Eyes: Anicteric ENMT: Moist mucous membranes Neck: normal, supple Respiratory: Bibasilar crackles, no wheezing, respirations diminished at the bases Cardiovascular: Irregular, tachycardic Abdomen: Soft, NT, ND, bowel sounds positive Musculoskeletal: no clubbing / cyanosis.  Skin: No rashes seen Neurologic: Nonfocal, does not follow commands consistently   Data Reviewed: I have independently reviewed following labs and imaging studies   CBC: Recent Labs  Lab 02/13/21 0716 02/14/21 0309 02/15/21 0352 02/16/21 0217 02/17/21 0326  WBC 8.6 6.4 5.7 4.6 5.1  NEUTROABS  --   --  4.5 3.6  --   HGB 11.6* 11.7* 10.0* 9.4* 9.4*  HCT 36.2 37.1 30.8* 29.5* 30.3*  MCV 98.4 98.9 99.7 99.3 101.0*  PLT 156 141* 112* 116* 140*    Basic Metabolic Panel: Recent Labs  Lab 02/13/21 0716 02/14/21 0309 02/15/21 0352 02/16/21 0217 02/17/21 0326  NA 140 142 138 137 135  K 3.7 4.1 3.8 3.7 4.2  CL 108 106 106 105 105  CO2 23 28 24 25 26   GLUCOSE 121*  131* 95 108* 91  BUN 16 16 16 21 23   CREATININE 1.13* 1.23* 1.22* 1.22* 1.16*  CALCIUM 8.7* 8.8* 8.0* 8.2* 8.3*  MG  --   --  2.0 2.1 2.1  PHOS  --   --  3.6 3.8 3.5    Liver Function Tests: Recent Labs  Lab 02/15/21 0352 02/16/21 0217 02/17/21 0326  AST 25 22 20   ALT 9 7 6   ALKPHOS 84 86 88  BILITOT 1.2 1.5* 1.1  PROT 4.6* 4.5* 4.7*  ALBUMIN 2.6* 2.4* 2.4*    Coagulation Profile: No results for input(s): INR, PROTIME in the last 168 hours. HbA1C: No results for input(s): HGBA1C in the last 72 hours. CBG: No results for input(s): GLUCAP in the last 168 hours.  Recent Results (from the past 240 hour(s))  Resp Panel by RT-PCR (Flu A&B, Covid) Nasopharyngeal Swab     Status: None   Collection Time: 02/13/21 11:44 AM   Specimen: Nasopharyngeal Swab; Nasopharyngeal(NP) swabs in vial transport medium  Result Value Ref Range Status  SARS Coronavirus 2 by RT PCR NEGATIVE NEGATIVE Final    Comment: (NOTE) SARS-CoV-2 target nucleic acids are NOT DETECTED.  The SARS-CoV-2 RNA is generally detectable in upper respiratory specimens during the acute phase of infection. The lowest concentration of SARS-CoV-2 viral copies this assay can detect is 138 copies/mL. A negative result does not preclude SARS-Cov-2 infection and should not be used as the sole basis for treatment or other patient management decisions. A negative result may occur with  improper specimen collection/handling, submission of specimen other than nasopharyngeal swab, presence of viral mutation(s) within the areas targeted by this assay, and inadequate number of viral copies(<138 copies/mL). A negative result must be combined with clinical observations, patient history, and epidemiological information. The expected result is Negative.  Fact Sheet for Patients:  EntrepreneurPulse.com.au  Fact Sheet for Healthcare Providers:  IncredibleEmployment.be  This test is no t yet  approved or cleared by the [place] FDA and  has been authorized for detection and/or diagnosis of SARS-CoV-2 by FDA under an Emergency Use Authorization (EUA). This EUA will remain  in effect (meaning this test can be used) for the duration of the COVID-19 declaration under Section 564(b)(1) of the Act, 21 U.S.C.section 360bbb-3(b)(1), unless the authorization is terminated  or revoked sooner.       Influenza A by PCR NEGATIVE NEGATIVE Final   Influenza B by PCR NEGATIVE NEGATIVE Final    Comment: (NOTE) The Xpert Xpress SARS-CoV-2/FLU/RSV plus assay is intended as an aid in the diagnosis of influenza from Nasopharyngeal swab specimens and should not be used as a sole basis for treatment. Nasal washings and aspirates are unacceptable for Xpert Xpress SARS-CoV-2/FLU/RSV testing.  Fact Sheet for Patients: EntrepreneurPulse.com.au  Fact Sheet for Healthcare Providers: IncredibleEmployment.be  This test is not yet approved or cleared by the [place] FDA and has been authorized for detection and/or diagnosis of SARS-CoV-2 by FDA under an Emergency Use Authorization (EUA). This EUA will remain in effect (meaning this test can be used) for the duration of the COVID-19 declaration under Section 564(b)(1) of the Act, 21 U.S.C. section 360bbb-3(b)(1), unless the authorization is terminated or revoked.  Performed at [facility], [place] [address] [place], [place] [address]   Surgical pcr screen     Status: None   Collection Time: 02/13/21  8:24 PM   Specimen: Nasal Mucosa; Nasal Swab  Result Value Ref Range Status   MRSA, PCR NEGATIVE NEGATIVE Final   Staphylococcus aureus NEGATIVE NEGATIVE Final    Comment: (NOTE) The Xpert SA Assay (FDA approved for NASAL specimens in patients 67 years of age and older), is one component of a comprehensive surveillance program. It is not intended to diagnose infection nor to guide or monitor  treatment. Performed at [facility], [place] [address] [place], [place] [address]       Radiology Studies: ECHOCARDIOGRAM COMPLETE  Result Date: 02/16/2021    ECHOCARDIOGRAM REPORT   Patient Name:   VANITY LARSSON Date of Exam: 02/16/2021 Medical Rec #:  [id number]       Height:       59.0 in Accession #:    [id number]      Weight:       119.3 lb Date of Birth:  May 21, 1927       BSA:          1.481 m Patient Age:    10 years        BP:  93/67 mmHg Patient Gender: F               HR:           126 bpm. Exam Location:  Inpatient Procedure: 2D Echo, Cardiac Doppler, Color Doppler and Intracardiac            Opacification Agent Indications:    Afib  History:        Patient has no prior history of Echocardiogram examinations.  Sonographer:    Jyl Heinz Referring Phys: [id number] [place]  1. Left ventricular ejection fraction, by estimation, is 30 to 35%. The left ventricle has moderately decreased function. The left ventricle demonstrates global hypokinesis. Diastolic function indeterminant due to AFib.  2. Right ventricular systolic function is normal. The right ventricular size is mildly enlarged. There is mildly elevated pulmonary artery systolic pressure. The estimated right ventricular systolic pressure is 42.7 mmHg.  3. Left atrial size was severely dilated.  4. Right atrial size was severely dilated.  5. The mitral valve is grossly normal. Moderate mitral valve regurgitation.  6. Tricuspid valve regurgitation is severe.  7. The aortic valve is tricuspid. There is mild calcification of the aortic valve. There is mild thickening of the aortic valve. Aortic valve regurgitation is mild. Aortic valve sclerosis/calcification is present, without any evidence of aortic stenosis.  8. The inferior vena cava is dilated in size with <50% respiratory variability, suggesting right atrial pressure of 15 mmHg. Comparison(s): No prior Echocardiogram. FINDINGS  Left Ventricle: Left  ventricular ejection fraction, by estimation, is 30 to 35%. The left ventricle has moderately decreased function. The left ventricle demonstrates global hypokinesis. The left ventricular internal cavity size was normal in size. There is no left ventricular hypertrophy. Diastolic function indeterminant due to AFib. Right Ventricle: The right ventricular size is mildly enlarged. No increase in right ventricular wall thickness. Right ventricular systolic function is normal. There is mildly elevated pulmonary artery systolic pressure. The tricuspid regurgitant velocity is 2.70 m/s, and with an assumed right atrial pressure of 15 mmHg, the estimated right ventricular systolic pressure is 06.2 mmHg. Left Atrium: Left atrial size was severely dilated. Right Atrium: Right atrial size was severely dilated. Pericardium: There is no evidence of pericardial effusion. Mitral Valve: The mitral valve is grossly normal. There is mild thickening of the mitral valve leaflet(s). There is mild calcification of the mitral valve leaflet(s). Mild mitral annular calcification. Moderate mitral valve regurgitation. Tricuspid Valve: The tricuspid valve is normal in structure. Tricuspid valve regurgitation is severe. Aortic Valve: The aortic valve is tricuspid. There is mild calcification of the aortic valve. There is mild thickening of the aortic valve. Aortic valve regurgitation is mild. Aortic regurgitation PHT measures 549 msec. Aortic valve sclerosis/calcification is present, without any evidence of aortic stenosis. Aortic valve peak gradient measures 6.8 mmHg. Pulmonic Valve: The pulmonic valve was normal in structure. Pulmonic valve regurgitation is mild. Aorta: The aortic root and ascending aorta are structurally normal, with no evidence of dilitation. Venous: The inferior vena cava is dilated in size with less than 50% respiratory variability, suggesting right atrial pressure of 15 mmHg. IAS/Shunts: The atrial septum is grossly  normal.  LEFT VENTRICLE PLAX 2D LVIDd:         4.40 cm     Diastology LVIDs:         3.30 cm     LV e' medial:    7.94 cm/s LV PW:         0.80 cm  LV E/e' medial:  12.2 LV IVS:        0.70 cm     LV e' lateral:   11.40 cm/s LVOT diam:     2.00 cm     LV E/e' lateral: 8.5 LV SV:         36 LV SV Index:   24 LVOT Area:     3.14 cm  LV Volumes (MOD) LV vol d, MOD A2C: 76.0 ml LV vol d, MOD A4C: 82.3 ml LV vol s, MOD A2C: 38.3 ml LV vol s, MOD A4C: 43.1 ml LV SV MOD A2C:     37.7 ml LV SV MOD A4C:     82.3 ml LV SV MOD BP:      37.2 ml RIGHT VENTRICLE             IVC RV Basal diam:  4.50 cm     IVC diam: 1.50 cm RV Mid diam:    4.30 cm RV S prime:     13.10 cm/s TAPSE (M-mode): 2.0 cm LEFT ATRIUM             Index        RIGHT ATRIUM           Index LA diam:        4.10 cm 2.77 cm/m   RA Area:     32.20 cm LA Vol (A2C):   77.8 ml 52.55 ml/m  RA Volume:   121.00 ml 81.72 ml/m LA Vol (A4C):   83.6 ml 56.46 ml/m LA Biplane Vol: 89.6 ml 60.52 ml/m  AORTIC VALVE AV Area (Vmax): 2.02 cm AV Vmax:        130.50 cm/s AV Peak Grad:   6.8 mmHg LVOT Vmax:      83.90 cm/s LVOT Vmean:     57.200 cm/s LVOT VTI:       0.114 m AI PHT:         549 msec  AORTA Ao Root diam: 2.40 cm Ao Asc diam:  2.60 cm MITRAL VALVE               TRICUSPID VALVE MV Area (PHT): 4.86 cm    TR Peak grad:   29.2 mmHg MV Decel Time: 156 msec    TR Vmax:        270.00 cm/s MR Peak grad: 70.9 mmHg MR Mean grad: 49.5 mmHg    SHUNTS MR Vmax:      421.00 cm/s  Systemic VTI:  0.11 m MR Vmean:     338.5 cm/s   Systemic Diam: 2.00 cm MV E velocity: 96.90 cm/s Gwyndolyn Kaufman MD Electronically signed by Gwyndolyn Kaufman MD Signature Date/Time: 02/16/2021/1:44:26 PM    Final       Marzetta Board, MD, PhD [facility]  Between 7 am - 7 pm I am available, please contact me via Amion (for emergencies) or Securechat (non urgent messages)  Between 7 pm - 7 am I am not available, please contact night coverage MD/APP via Amion

## 2021-02-17 NOTE — Progress Notes (Signed)
Progress Note  Patient Name: Kathy Massey Date of Encounter: 02/17/2021  [facility] Cardiologist: Will Meredith Leeds, MD   Subjective   Some leg pain, + confusion   Inpatient Medications    Scheduled Meds:  apixaban  2.5 mg Oral BID   Chlorhexidine Gluconate Cloth  6 each Topical Daily   docusate sodium  100 mg Oral BID   feeding supplement  237 mL Oral BID BM   mouth rinse  15 mL Mouth Rinse BID   midodrine  10 mg Oral TID WC   multivitamin with minerals  1 tablet Oral Daily   QUEtiapine  12.5 mg Oral QHS   Continuous Infusions:  amiodarone Stopped (02/16/21 1030)   methocarbamol (ROBAXIN) IV     PRN Meds: acetaminophen, bisacodyl, methocarbamol **OR** methocarbamol (ROBAXIN) IV, morphine injection, ondansetron (ZOFRAN) IV, oxyCODONE, polyethylene glycol   Vital Signs    Vitals:   02/17/21 0407 02/17/21 0409 02/17/21 0736 02/17/21 0741  BP: 99/78 105/82 119/79 111/88  Pulse: (!) 124 (!) 120 84 80  Resp: (!) 24 20 (!) 22 17  Temp: 98.7 F (37.1 C) 98.7 F (37.1 C) 98.1 F (36.7 C)   TempSrc: Oral Oral Oral   SpO2: 95% 100% 97% 99%  Weight:      Height:        Intake/Output Summary (Last 24 hours) at 02/17/2021 0925 Last data filed at 02/16/2021 1831 Gross per 24 hour  Intake 60 ml  Output 200 ml  Net -140 ml   Last 3 Weights 02/16/2021 02/14/2021 02/13/2021  Weight (lbs) 119 lb 4.3 oz 110 lb 110 lb  Weight (kg) 54.1 kg 49.896 kg 49.896 kg      Telemetry    A fib RVR more so when awake. - Personally Reviewed  ECG    No new - Personally Reviewed  Physical Exam   GEN: No acute distress.   Neck: No JVD Cardiac: irreg irreg, no murmurs, rubs, or gallops.  Respiratory: Clear to auscultation bilaterally. GI: Soft, nontender, non-distended  MS: No edema; No deformity. Neuro:  Nonfocal  Psych: Normal affect   Labs    High Sensitivity Troponin:  No results for input(s): TROPONINIHS in the last 720 hours.   Chemistry Recent Labs  Lab  02/15/21 0352 02/16/21 0217 02/17/21 0326  NA 138 137 135  K 3.8 3.7 4.2  CL 106 105 105  CO2 24 25 26   GLUCOSE 95 108* 91  BUN 16 21 23   CREATININE 1.22* 1.22* 1.16*  CALCIUM 8.0* 8.2* 8.3*  MG 2.0 2.1 2.1  PROT 4.6* 4.5* 4.7*  ALBUMIN 2.6* 2.4* 2.4*  AST 25 22 20   ALT 9 7 6   ALKPHOS 84 86 88  BILITOT 1.2 1.5* 1.1  GFRNONAA 42* 42* 45*  ANIONGAP 8 7 4*    Lipids No results for input(s): CHOL, TRIG, HDL, LABVLDL, LDLCALC, CHOLHDL in the last 168 hours.  Hematology Recent Labs  Lab 02/15/21 0352 02/16/21 0217 02/17/21 0326  WBC 5.7 4.6 5.1  RBC 3.09*   3.12* 2.97* 3.00*  HGB 10.0* 9.4* 9.4*  HCT 30.8* 29.5* 30.3*  MCV 99.7 99.3 101.0*  MCH 32.4 31.6 31.3  MCHC 32.5 31.9 31.0  RDW 16.6* 16.2* 16.1*  PLT 112* 116* 140*   Thyroid No results for input(s): TSH, FREET4 in the last 168 hours.  BNP Recent Labs  Lab 02/15/21 1554  BNP 227.9*    DDimer No results for input(s): DDIMER in the last 168 hours.  Radiology    DG CHEST PORT 1 VIEW  Result Date: 02/16/2021 CLINICAL DATA:  Shortness of breath. EXAM: PORTABLE CHEST 1 VIEW COMPARISON:  CT chest 12/04/2017. FINDINGS: Prominent cardiomegaly. No pulmonary venous congestion. Low lung volumes with mild bibasilar atelectasis. Tiny bilateral pleural effusions cannot be excluded. No pneumothorax. Mild thoracic spine scoliosis. Distal left clavicle fracture, age undetermined. IMPRESSION: 1.  Prominent cardiomegaly, no pulmonary venous congestion. 2. Low lung volumes with mild bibasilar atelectasis. Tiny bilateral pleural effusions cannot be excluded. 3.  Distal left clavicle fracture, age undetermined. Electronically Signed   By: Marcello Moores  Register M.D.   On: 02/16/2021 06:30   ECHOCARDIOGRAM COMPLETE  Result Date: 02/16/2021    ECHOCARDIOGRAM REPORT   Patient Name:   Kathy Massey Date of Exam: 02/16/2021 Medical Rec #:  [id number]       Height:       59.0 in Accession #:    [id number]      Weight:       119.3 lb Date of Birth:   [birth year]       BSA:          1.481 m Patient Age:    88 years        BP:           93/67 mmHg Patient Gender: F               HR:           126 bpm. Exam Location:  Inpatient Procedure: 2D Echo, Cardiac Doppler, Color Doppler and Intracardiac            Opacification Agent Indications:    Afib  History:        Patient has no prior history of Echocardiogram examinations.  Sonographer:    Jyl Heinz Referring Phys: [id number] [place]  1. Left ventricular ejection fraction, by estimation, is 30 to 35%. The left ventricle has moderately decreased function. The left ventricle demonstrates global hypokinesis. Diastolic function indeterminant due to AFib.  2. Right ventricular systolic function is normal. The right ventricular size is mildly enlarged. There is mildly elevated pulmonary artery systolic pressure. The estimated right ventricular systolic pressure is 24.5 mmHg.  3. Left atrial size was severely dilated.  4. Right atrial size was severely dilated.  5. The mitral valve is grossly normal. Moderate mitral valve regurgitation.  6. Tricuspid valve regurgitation is severe.  7. The aortic valve is tricuspid. There is mild calcification of the aortic valve. There is mild thickening of the aortic valve. Aortic valve regurgitation is mild. Aortic valve sclerosis/calcification is present, without any evidence of aortic stenosis.  8. The inferior vena cava is dilated in size with <50% respiratory variability, suggesting right atrial pressure of 15 mmHg. Comparison(s): No prior Echocardiogram. FINDINGS  Left Ventricle: Left ventricular ejection fraction, by estimation, is 30 to 35%. The left ventricle has moderately decreased function. The left ventricle demonstrates global hypokinesis. The left ventricular internal cavity size was normal in size. There is no left ventricular hypertrophy. Diastolic function indeterminant due to AFib. Right Ventricle: The right ventricular size is mildly enlarged.  No increase in right ventricular wall thickness. Right ventricular systolic function is normal. There is mildly elevated pulmonary artery systolic pressure. The tricuspid regurgitant velocity is 2.70 m/s, and with an assumed right atrial pressure of 15 mmHg, the estimated right ventricular systolic pressure is 80.9 mmHg. Left Atrium: Left atrial size was severely dilated. Right Atrium: Right atrial size was severely dilated.  Pericardium: There is no evidence of pericardial effusion. Mitral Valve: The mitral valve is grossly normal. There is mild thickening of the mitral valve leaflet(s). There is mild calcification of the mitral valve leaflet(s). Mild mitral annular calcification. Moderate mitral valve regurgitation. Tricuspid Valve: The tricuspid valve is normal in structure. Tricuspid valve regurgitation is severe. Aortic Valve: The aortic valve is tricuspid. There is mild calcification of the aortic valve. There is mild thickening of the aortic valve. Aortic valve regurgitation is mild. Aortic regurgitation PHT measures 549 msec. Aortic valve sclerosis/calcification is present, without any evidence of aortic stenosis. Aortic valve peak gradient measures 6.8 mmHg. Pulmonic Valve: The pulmonic valve was normal in structure. Pulmonic valve regurgitation is mild. Aorta: The aortic root and ascending aorta are structurally normal, with no evidence of dilitation. Venous: The inferior vena cava is dilated in size with less than 50% respiratory variability, suggesting right atrial pressure of 15 mmHg. IAS/Shunts: The atrial septum is grossly normal.  LEFT VENTRICLE PLAX 2D LVIDd:         4.40 cm     Diastology LVIDs:         3.30 cm     LV e' medial:    7.94 cm/s LV PW:         0.80 cm     LV E/e' medial:  12.2 LV IVS:        0.70 cm     LV e' lateral:   11.40 cm/s LVOT diam:     2.00 cm     LV E/e' lateral: 8.5 LV SV:         36 LV SV Index:   24 LVOT Area:     3.14 cm  LV Volumes (MOD) LV vol d, MOD A2C: 76.0 ml LV  vol d, MOD A4C: 82.3 ml LV vol s, MOD A2C: 38.3 ml LV vol s, MOD A4C: 43.1 ml LV SV MOD A2C:     37.7 ml LV SV MOD A4C:     82.3 ml LV SV MOD BP:      37.2 ml RIGHT VENTRICLE             IVC RV Basal diam:  4.50 cm     IVC diam: 1.50 cm RV Mid diam:    4.30 cm RV S prime:     13.10 cm/s TAPSE (M-mode): 2.0 cm LEFT ATRIUM             Index        RIGHT ATRIUM           Index LA diam:        4.10 cm 2.77 cm/m   RA Area:     32.20 cm LA Vol (A2C):   77.8 ml 52.55 ml/m  RA Volume:   121.00 ml 81.72 ml/m LA Vol (A4C):   83.6 ml 56.46 ml/m LA Biplane Vol: 89.6 ml 60.52 ml/m  AORTIC VALVE AV Area (Vmax): 2.02 cm AV Vmax:        130.50 cm/s AV Peak Grad:   6.8 mmHg LVOT Vmax:      83.90 cm/s LVOT Vmean:     57.200 cm/s LVOT VTI:       0.114 m AI PHT:         549 msec  AORTA Ao Root diam: 2.40 cm Ao Asc diam:  2.60 cm MITRAL VALVE               TRICUSPID VALVE MV Area (PHT): 4.86  cm    TR Peak grad:   29.2 mmHg MV Decel Time: 156 msec    TR Vmax:        270.00 cm/s MR Peak grad: 70.9 mmHg MR Mean grad: 49.5 mmHg    SHUNTS MR Vmax:      421.00 cm/s  Systemic VTI:  0.11 m MR Vmean:     338.5 cm/s   Systemic Diam: 2.00 cm MV E velocity: 96.90 cm/s Gwyndolyn Kaufman MD Electronically signed by Gwyndolyn Kaufman MD Signature Date/Time: 02/16/2021/1:44:26 PM    Final     Cardiac Studies  Echo 02/16/21 IMPRESSIONS     1. Left ventricular ejection fraction, by estimation, is 30 to 35%. The  left ventricle has moderately decreased function. The left ventricle  demonstrates global hypokinesis. Diastolic function indeterminant due to  AFib.   2. Right ventricular systolic function is normal. The right ventricular  size is mildly enlarged. There is mildly elevated pulmonary artery  systolic pressure. The estimated right ventricular systolic pressure is  09.4 mmHg.   3. Left atrial size was severely dilated.   4. Right atrial size was severely dilated.   5. The mitral valve is grossly normal. Moderate mitral valve   regurgitation.   6. Tricuspid valve regurgitation is severe.   7. The aortic valve is tricuspid. There is mild calcification of the  aortic valve. There is mild thickening of the aortic valve. Aortic valve  regurgitation is mild. Aortic valve sclerosis/calcification is present,  without any evidence of aortic  stenosis.   8. The inferior vena cava is dilated in size with <50% respiratory  variability, suggesting right atrial pressure of 15 mmHg.   Comparison(s): No prior Echocardiogram.   FINDINGS   Left Ventricle: Left ventricular ejection fraction, by estimation, is 30  to 35%. The left ventricle has moderately decreased function. The left  ventricle demonstrates global hypokinesis. The left ventricular internal  cavity size was normal in size.  There is no left ventricular hypertrophy. Diastolic function indeterminant  due to AFib.   Right Ventricle: The right ventricular size is mildly enlarged. No  increase in right ventricular wall thickness. Right ventricular systolic  function is normal. There is mildly elevated pulmonary artery systolic  pressure. The tricuspid regurgitant  velocity is 2.70 m/s, and with an assumed right atrial pressure of 15  mmHg, the estimated right ventricular systolic pressure is 70.9 mmHg.   Left Atrium: Left atrial size was severely dilated.   Right Atrium: Right atrial size was severely dilated.   Pericardium: There is no evidence of pericardial effusion.   Mitral Valve: The mitral valve is grossly normal. There is mild thickening  of the mitral valve leaflet(s). There is mild calcification of the mitral  valve leaflet(s). Mild mitral annular calcification. Moderate mitral valve  regurgitation.   Tricuspid Valve: The tricuspid valve is normal in structure. Tricuspid  valve regurgitation is severe.   Aortic Valve: The aortic valve is tricuspid. There is mild calcification  of the aortic valve. There is mild thickening of the aortic valve.  Aortic  valve regurgitation is mild. Aortic regurgitation PHT measures 549 msec.  Aortic valve  sclerosis/calcification is present, without any evidence of aortic  stenosis. Aortic valve peak gradient measures 6.8 mmHg.   Pulmonic Valve: The pulmonic valve was normal in structure. Pulmonic valve  regurgitation is mild.   Aorta: The aortic root and ascending aorta are structurally normal, with  no evidence of dilitation.   ECHO: 06/16/2015  Normal left  ventricular systolic function, ejection fraction > 09%   Diastolic dysfunction - grade I (normal filling pressures)   Dilated left atrium - mild   Degenerative mitral valve disease   Tricuspid regurgitation - mild   Pulmonic regurgitation - mild   Normal right ventricular systolic function   Dilated right atrium - mild  Patient Profile     88 y.o. female with a hx of dementia, Afib w/ rate control strategy on Eliquis, MI 2017 felt 2nd stress induced w/ apical WMA at cath & no CAD, non-Hodgkin's lymphoma, IDA, osteoporosis seen for afib rvr  Assessment & Plan    POST OP A Fib with RVR.   --HR today 130s -during night 110 or so but when awake HR elevated.   --has been unable to tolerate dilt and amio consider dig but with age and Cr. Concern ? Po amio? --on eliquis 2.5 BID but missed dose last pm due to being too sleepy. --new CM from 2017 looking back at EKGs HR was in 80s in August 2022.   New CM, with volume overload EF 30-35% --BNP 227 --Hypotensive on midodrine  --her I&O is balanced --unable to begin ACE/ARB/Entresto /spiron due to hypotension.   --does HR have a role in new CM?  Rt hip fracture s/p surgery  POD #3  Post op anemia with hgb 9.4 down from 11.7 on admit -with BP issues and HR ? transfuse             For questions or updates, please contact [place] Please consult [email or web address] for contact info under        Signed, Cecilie Kicks, NP  02/17/2021, 9:25 AM

## 2021-02-17 NOTE — Care Management Important Message (Signed)
Important Message  Patient Details  Name: NAFISA OLDS MRN: [id number] Date of Birth: [birth year]   Medicare Important Message Given:  Yes     Shelda Altes 02/17/2021, 7:16 AM

## 2021-02-18 DIAGNOSIS — Z515 Encounter for palliative care: Secondary | ICD-10-CM

## 2021-02-18 DIAGNOSIS — E8779 Other fluid overload: Secondary | ICD-10-CM | POA: Diagnosis not present

## 2021-02-18 DIAGNOSIS — I4821 Permanent atrial fibrillation: Secondary | ICD-10-CM | POA: Diagnosis not present

## 2021-02-18 DIAGNOSIS — Z7189 Other specified counseling: Secondary | ICD-10-CM | POA: Diagnosis not present

## 2021-02-18 DIAGNOSIS — S72141A Displaced intertrochanteric fracture of right femur, initial encounter for closed fracture: Secondary | ICD-10-CM | POA: Diagnosis not present

## 2021-02-18 LAB — CBC
HCT: 31.3 % — ABNORMAL LOW (ref 36.0–46.0)
Hemoglobin: 9.8 g/dL — ABNORMAL LOW (ref 12.0–15.0)
MCH: 30.9 pg (ref 26.0–34.0)
MCHC: 31.3 g/dL (ref 30.0–36.0)
MCV: 98.7 fL (ref 80.0–100.0)
Platelets: 158 10*3/uL (ref 150–400)
RBC: 3.17 MIL/uL — ABNORMAL LOW (ref 3.87–5.11)
RDW: 15.9 % — ABNORMAL HIGH (ref 11.5–15.5)
WBC: 5.2 10*3/uL (ref 4.0–10.5)
nRBC: 0 % (ref 0.0–0.2)

## 2021-02-18 LAB — COMPREHENSIVE METABOLIC PANEL
ALT: <5 U/L (ref 0–44)
AST: 30 U/L (ref 15–41)
Albumin: 2.3 g/dL — ABNORMAL LOW (ref 3.5–5.0)
Alkaline Phosphatase: 93 U/L (ref 38–126)
Anion gap: 10 (ref 5–15)
BUN: 27 mg/dL — ABNORMAL HIGH (ref 8–23)
CO2: 23 mmol/L (ref 22–32)
Calcium: 8.1 mg/dL — ABNORMAL LOW (ref 8.9–10.3)
Chloride: 103 mmol/L (ref 98–111)
Creatinine, Ser: 1.22 mg/dL — ABNORMAL HIGH (ref 0.44–1.00)
GFR, Estimated: 42 mL/min — ABNORMAL LOW (ref >60)
Glucose, Bld: 106 mg/dL — ABNORMAL HIGH (ref 70–99)
Potassium: 4.1 mmol/L (ref 3.5–5.1)
Sodium: 136 mmol/L (ref 135–145)
Total Bilirubin: 1.4 mg/dL — ABNORMAL HIGH (ref 0.3–1.2)
Total Protein: 4.7 g/dL — ABNORMAL LOW (ref 6.5–8.1)

## 2021-02-18 MED ORDER — HYDROXYZINE HCL 10 MG PO TABS
5.0000 mg | ORAL_TABLET | Freq: Three times a day (TID) | ORAL | Status: DC | PRN
  Administered 2021-02-18 – 2021-02-22 (×4): 5 mg via ORAL
  Filled 2021-02-18 (×8): qty 1

## 2021-02-18 MED ORDER — FUROSEMIDE 40 MG PO TABS
40.0000 mg | ORAL_TABLET | Freq: Every day | ORAL | Status: DC
  Administered 2021-02-18 – 2021-02-24 (×7): 40 mg via ORAL
  Filled 2021-02-18 (×7): qty 1

## 2021-02-18 MED ORDER — ACETAMINOPHEN 325 MG PO TABS
650.0000 mg | ORAL_TABLET | Freq: Three times a day (TID) | ORAL | Status: DC
  Administered 2021-02-18 – 2021-02-19 (×4): 650 mg via ORAL
  Filled 2021-02-18 (×4): qty 2

## 2021-02-18 MED ORDER — ESCITALOPRAM OXALATE 10 MG PO TABS
5.0000 mg | ORAL_TABLET | Freq: Every day | ORAL | Status: DC
  Administered 2021-02-18 – 2021-02-24 (×7): 5 mg via ORAL
  Filled 2021-02-18 (×7): qty 1

## 2021-02-18 MED ORDER — SODIUM CHLORIDE 0.9 % IV BOLUS
500.0000 mL | Freq: Once | INTRAVENOUS | Status: AC
  Administered 2021-02-18: 500 mL via INTRAVENOUS

## 2021-02-18 NOTE — Progress Notes (Signed)
Physical Therapy Treatment Patient Details Name: Kathy Massey MRN: [id number] DOB: 1927-04-18 Today's Date: 02/18/2021   History of Present Illness Pt is a 88 y/o female s/p right hip intertrochanteric hip fixation with a nail on 02/14/2021 and is WBAT RLE. PMH includes advanced dementia, atrial fibrillation, CAD, hypertension, and lymphoma. Pt lives in a memory care unit.    PT Comments    The pt was seen today for continued progress of OOB mobility and activity tolerance. She was agreeable to session, but continues to be limited by significant pain in R hip, and requires totalA to complete transition to sitting EOB, to attempt standing at EOB, and to return to supine. The pt did attempt to assist with LE movements, but was unable to complete >25% of movements on her own. The pt did complete a series of exercises for BLE, but requires assist to complete full ROM against gravity at this time. Will continue to benefit from skilled PT to progress activity tolerance and capacity for transfers, continue to recommend SNF rehab when medically stable for d/c.     Recommendations for follow up therapy are one component of a multi-disciplinary discharge planning process, led by the attending physician.  Recommendations may be updated based on patient status, additional functional criteria and insurance authorization.  Follow Up Recommendations  Skilled nursing-short term rehab (<3 hours/day)     Assistance Recommended at Discharge Frequent or constant Supervision/Assistance  Patient can return home with the following Other (comment) (from ALF memory care)   Equipment Recommendations  Other (comment) (defer to post acute)    Recommendations for Other Services       Precautions / Restrictions Precautions Precautions: Fall;Other (comment) Precaution Comments: watch BP and HR Restrictions Weight Bearing Restrictions: Yes RLE Weight Bearing: Weight bearing as tolerated     Mobility  Bed  Mobility Overal bed mobility: Needs Assistance Bed Mobility: Rolling;Supine to Sit;Sit to Supine Rolling: Total assist   Supine to sit: Total assist Sit to supine: Total assist   General bed mobility comments: pt attempting to assist with movement of each LE towards EOB, but unable to complete >25% on her own. totalA to return to bed with pt unable to assist    Transfers Overall transfer level: Needs assistance Equipment used: Rolling walker (2 wheels);1 person hand held assist Transfers: Sit to/from Stand Sit to Stand: Total assist           General transfer comment: unable to achieve full stand, pt did achieve some hip clearance with blocking of L knee. no wt put on RLE    Ambulation/Gait               General Gait Details: unable to achieve full stand     Balance Overall balance assessment: Needs assistance Sitting-balance support: Bilateral upper extremity supported;Feet supported Sitting balance-Leahy Scale: Poor Sitting balance - Comments: pt with BUE support and generally shaky while sitting EOB. no overt LOB. HR 120-159bpm in afib Postural control: Posterior lean Standing balance support: Bilateral upper extremity supported Standing balance-Leahy Scale: Zero Standing balance comment: dependent on therapist                            Cognition Arousal/Alertness: Awake/alert Behavior During Therapy: Anxious Overall Cognitive Status: History of cognitive impairments - at baseline  General Comments: advanced dementia at baseline. Resides in memory care unit.        Exercises General Exercises - Lower Extremity Ankle Circles/Pumps: AAROM;Both;10 reps;Supine Long Arc Quad: AAROM;Both;10 reps;Seated Heel Slides: AAROM;Left;5 reps;Supine Hip Flexion/Marching: AROM;Left;10 reps;Seated    General Comments General comments (skin integrity, edema, etc.): HR 120-159bpm, RN present and aware       Pertinent Vitals/Pain Pain Assessment: Faces Faces Pain Scale: Hurts whole lot Pain Location: R hip, pt also grimacing with movement of LLE Pain Descriptors / Indicators: Discomfort;Grimacing;Crying;Operative site guarding;Moaning Pain Intervention(s): Limited activity within patient's tolerance;Monitored during session;Repositioned     PT Goals (current goals can now be found in the care plan section) Acute Rehab PT Goals Patient Stated Goal: return to [facility] per daughter and son PT Goal Formulation: With patient Time For Goal Achievement: 03/01/21 Potential to Achieve Goals: Fair Progress towards PT goals: Progressing toward goals    Frequency    Min 3X/week      PT Plan Current plan remains appropriate       AM-PAC PT "6 Clicks" Mobility   Outcome Measure  Help needed turning from your back to your side while in a flat bed without using bedrails?: Total Help needed moving from lying on your back to sitting on the side of a flat bed without using bedrails?: Total Help needed moving to and from a bed to a chair (including a wheelchair)?: Total Help needed standing up from a chair using your arms (e.g., wheelchair or bedside chair)?: Total Help needed to walk in hospital room?: Total Help needed climbing 3-5 steps with a railing? : Total 6 Click Score: 6    End of Session Equipment Utilized During Treatment: Gait belt Activity Tolerance: Patient limited by pain Patient left: in bed;with call bell/phone within reach;with bed alarm set;with family/visitor present Nurse Communication: Mobility status PT Visit Diagnosis: Unsteadiness on feet (R26.81);Other abnormalities of gait and mobility (R26.89);Muscle weakness (generalized) (M62.81);History of falling (Z91.81);Pain Pain - Right/Left: Right Pain - part of body: Hip     Time: 9169-4503 PT Time Calculation (min) (ACUTE ONLY): 46 min  Charges:  $Therapeutic Exercise: 23-37 mins $Therapeutic Activity: 8-22  mins                     West Carbo, PT, DPT   Acute Rehabilitation Department Pager #: [phone]   Sandra Cockayne 02/18/2021, 6:12 PM

## 2021-02-18 NOTE — Consult Note (Signed)
°Palliative Medicine Inpatient Consult Note ° °Consulting Provider: Gherghe, Costin M, MD ° °Reason for consult:   °Palliative Care Consult Services Palliative Medicine Consult  °Reason for Consult? GOC  ° °HPI:  °Per intake H&P --> 88-year-old female with history of A. fib on Eliquis, CAD, HTN, dementia, history of lymphoma who came into the hospital and was admitted on 02/13/2021 after a ground-level fall.  She was found on the floor during the morning hours, could not recall what happened.  She was found to have a hip fracture on the right side and orthopedics was consulted, she is status post right hip intertrochanteric fixation with nail on 1/2.  Hospital course complicated by A. fib with RVR with rates into the 150s for which cardiology was consulted.  Due to persistent hypotension with diltiazem as well as amiodarone she was transferred to stepdown on 1/4. ° °Palliative care has been asked to get involved to discuss goals of care post surgery.  ° °Clinical Assessment/Goals of Care: ° °*Please note that this is a verbal dictation therefore any spelling or grammatical errors are due to the "Dragon Medical One" system interpretation. ° °I have reviewed medical records including EPIC notes, labs and imaging, received report from bedside RN, assessed the patient who is lying in bed pleasantly confused. °  °I met with Kathy Massey to further discuss diagnosis prognosis, GOC, EOL wishes, disposition and options. °  °I introduced Palliative Medicine as specialized medical care for people living with serious illness. It focuses on providing relief from the symptoms and stress of a serious illness. The goal is to improve quality of life for both the patient and the family. ° °Medical History Review and Understanding: ° °Kathy Massey is delirious and telling me about having a child, she is not coherent enough to provide an update on her clinical state. ° °Much of Kathy Massey's history was provided by her two children present as bedside. We  reviewed her clinical condition inclusive of her A. fib on Eliquis which has been a long standing issue, CAD, HTN, dementia, history of lymphoma which she went through treatment for. Family is regretful for this as it was a very slow disease process and chemo was hard on her body.  ° °Regarding patients dementia she has had this for many years though in the last year is when her deficits became more pronounced.  ° °We reviewed Kathy Massey fall leading her to need a right hip fx repair. Discussed that often in elderly dementia patients post-operative surgical complications are significant and this event can often lead to an increase in overall mortality.  ° °Social History: °Kathy Massey is from [place], [place]. She is a widow though was married to her spouse for 65 years. She is a mother of three. She has nine grandchildren and six great grandchildren. She use to love golfing and playing tennis. She was very active in her church and is of the methodist faith.  ° °Functional and Nutritional State: °Kathy Massey was very function prior to her fall she was able to walk 0.5-1 miles 3x weekly. She was "roaming" the halls at [facility] where she was a primary resident. She initially started in their independent living and then transitioned to their memory care facility.  ° °Palliative Symptoms: °Pain in right hip - incremental (+) facial grimacing.  ° °Advance Directives: °There are advance directives though we do not have these on file. Will request a copy.  ° °Code Status: °Concepts specific to code status, artifical feeding and hydration, continued   IV antibiotics and rehospitalization was had.  Reviewed that Kathy Massey would not wish for heroic measures to sustain life. Confirmed DNAR/DNI code status.  ° °Goals for the Future: ° °At this time plan to transition to [facility] when optimized medically. If she is not thriving there to further discuss hospice. ° °I described hospice as a service for patients who have a life expectancy  of 6 months or less.  ° °The goal of hospice is the preservation of dignity and quality at the end phases of life.  ° °Under hospice care, the focus changes from curative to symptom relief.  ° °Reviewed patients declined state, complications of hip repair that could occur. Reviewed the idea that Kathy Massey may not thrive and may very well continue to decline. Discussed complicated AFIB w. RVR , soft blood pressures, and FTT.  ° °Family open to OP Palliative care with [facility]. ° °Discussed the importance of continued conversation with family and their  medical providers regarding overall plan of care and treatment options, ensuring decisions are within the context of the patients values and GOCs. ° °Decision Maker: °Kathy Massey (daughter) [phone] ° °SUMMARY OF RECOMMENDATIONS   °DNAR/DNI  ° °Patient family would like to be medically optimized ° °Plan to transition to [facility] - if not thriving they are open to hospice through [facility] ° °TOC - Arrange OP Palliative support through [facility] ° °Ongoing PMT support ° °Code Status/Advance Care Planning: °DNAR/DNI °  °Symptom Management:  °Right Hip Pain: °- Tylenol 650mg PO Q8H ° °Palliative Prophylaxis:  °Aspiration, Bowel Regimen, Delirium Protocol, Frequent Pain Assessment, Oral Care, Palliative Wound Care, and Turn Reposition ° °Additional Recommendations (Limitations, Scope, Preferences): °Continue current scope of care ° °Psycho-social/Spiritual:  °Desire for further Chaplaincy support: No °Additional Recommendations: Education on post operative course associated with a hip fracture °  °Prognosis: Very high 12 month mortality risk  ° °Discharge Planning: Discharge to [facility] skilled nursing with OP Palliative support ° °Vitals:  ° 02/18/21 0210 02/18/21 0340  °BP: (!) 84/67 95/79  °Pulse: 100 99  °Resp: 20 18  °Temp:  98.9 °F (37.2 °C)  °SpO2:  96%  ° ° °Intake/Output Summary (Last 24 hours) at 02/18/2021  0637 °Last data filed at 02/18/2021 0331 °Gross per 24 hour  °Intake 550 ml  °Output 1550 ml  °Net -1000 ml  ° °Last Weight  Most recent update: 02/16/2021  3:36 AM  ° ° Weight  °54.1 kg (119 lb 4.3 oz)  °      ° °  ° °Gen:  Elderly Frail Caucasian F in NAD °HEENT: moist mucous membranes °CV: Irregular rate and rhythm  °PULM: On RA °ABD: soft/nontender  °EXT: No edema  °Neuro: Alert and oriented x3  ° °PPS: 30% ° ° °This conversation/these recommendations were discussed with patient primary care team, Dr. Gherghe ° °Time In: 1310 °Time Out: 1420 °Total Time: 70 ° °MDM - High  °______________________________________________________ °  °[facility] Team °Team Cell Phone: [phone] °Please utilize secure chat with additional questions, if there is no response within 30 minutes please call the above phone number ° °Palliative Medicine Team providers are available by phone from 7am to 7pm daily and can be reached through the team cell phone.  °Should this patient require assistance outside of these hours, please call the patient's attending physician. ° ° °

## 2021-02-18 NOTE — Progress Notes (Signed)
Progress Note  Patient Name: Kathy Massey Date of Encounter: 02/18/2021  [facility] Cardiologist: Will Meredith Leeds, MD   Subjective   + hip pain, son is in room, tells me her HR has been up and down for years.  He is asking about PT- they did not see yesterday.    Inpatient Medications    Scheduled Meds:  apixaban  2.5 mg Oral BID   Chlorhexidine Gluconate Cloth  6 each Topical Daily   docusate sodium  100 mg Oral BID   feeding supplement  237 mL Oral BID BM   mouth rinse  15 mL Mouth Rinse BID   midodrine  10 mg Oral TID WC   multivitamin with minerals  1 tablet Oral Daily   QUEtiapine  12.5 mg Oral QHS   Continuous Infusions:  amiodarone 30 mg/hr (02/18/21 0434)   methocarbamol (ROBAXIN) IV     PRN Meds: acetaminophen, bisacodyl, methocarbamol **OR** methocarbamol (ROBAXIN) IV, morphine injection, ondansetron (ZOFRAN) IV, oxyCODONE, polyethylene glycol   Vital Signs    Vitals:   02/17/21 2338 02/18/21 0210 02/18/21 0340 02/18/21 0724  BP: 106/76 (!) 84/67 95/79 96/69   Pulse: 97 100 99 (!) 110  Resp: 20 20 18 16   Temp:   98.9 F (37.2 C) 98.1 F (36.7 C)  TempSrc:   Axillary Oral  SpO2: 95%  96% 96%  Weight:      Height:        Intake/Output Summary (Last 24 hours) at 02/18/2021 0810 Last data filed at 02/18/2021 0331 Gross per 24 hour  Intake 550 ml  Output 1550 ml  Net -1000 ml   Last 3 Weights 02/16/2021 02/14/2021 02/13/2021  Weight (lbs) 119 lb 4.3 oz 110 lb 110 lb  Weight (kg) 54.1 kg 49.896 kg 49.896 kg      Telemetry    Atrial fib again at night 100-110 but with activity up to 120 on amiodarone - Personally Reviewed  ECG    No new - Personally Reviewed  Physical Exam   GEN: No acute distress.  General pain no chest pain Neck: No JVD Cardiac: irreg irreg, no murmurs, rubs, or gallops.  Respiratory: Clear to auscultation bilaterally. GI: Soft, nontender, non-distended  MS: No to tr edema; No deformity. Neuro:  Nonfocal  Psych: Normal  affect   Labs    High Sensitivity Troponin:  No results for input(s): TROPONINIHS in the last 720 hours.   Chemistry Recent Labs  Lab 02/15/21 0352 02/16/21 0217 02/17/21 0326 02/18/21 0418  NA 138 137 135 136  K 3.8 3.7 4.2 4.1  CL 106 105 105 103  CO2 24 25 26 23   GLUCOSE 95 108* 91 106*  BUN 16 21 23  27*  CREATININE 1.22* 1.22* 1.16* 1.22*  CALCIUM 8.0* 8.2* 8.3* 8.1*  MG 2.0 2.1 2.1  --   PROT 4.6* 4.5* 4.7* 4.7*  ALBUMIN 2.6* 2.4* 2.4* 2.3*  AST 25 22 20 30   ALT 9 7 6  <5  ALKPHOS 84 86 88 93  BILITOT 1.2 1.5* 1.1 1.4*  GFRNONAA 42* 42* 45* 42*  ANIONGAP 8 7 4* 10    Lipids No results for input(s): CHOL, TRIG, HDL, LABVLDL, LDLCALC, CHOLHDL in the last 168 hours.  Hematology Recent Labs  Lab 02/16/21 0217 02/17/21 0326 02/18/21 0418  WBC 4.6 5.1 5.2  RBC 2.97* 3.00* 3.17*  HGB 9.4* 9.4* 9.8*  HCT 29.5* 30.3* 31.3*  MCV 99.3 101.0* 98.7  MCH 31.6 31.3 30.9  MCHC 31.9  31.0 31.3  RDW 16.2* 16.1* 15.9*  PLT 116* 140* 158   Thyroid No results for input(s): TSH, FREET4 in the last 168 hours.  BNP Recent Labs  Lab 02/15/21 1554  BNP 227.9*    DDimer No results for input(s): DDIMER in the last 168 hours.   Radiology    ECHOCARDIOGRAM COMPLETE  Result Date: 02/16/2021    ECHOCARDIOGRAM REPORT   Patient Name:   Kathy Massey Date of Exam: 02/16/2021 Medical Rec #:  [id number]       Height:       59.0 in Accession #:    [id number]      Weight:       119.3 lb Date of Birth:  1927/06/20       BSA:          1.481 m Patient Age:    88 years        BP:           93/67 mmHg Patient Gender: F               HR:           126 bpm. Exam Location:  Inpatient Procedure: 2D Echo, Cardiac Doppler, Color Doppler and Intracardiac            Opacification Agent Indications:    Afib  History:        Patient has no prior history of Echocardiogram examinations.  Sonographer:    Jyl Heinz Referring Phys: [id number] [place]  1. Left ventricular ejection  fraction, by estimation, is 30 to 35%. The left ventricle has moderately decreased function. The left ventricle demonstrates global hypokinesis. Diastolic function indeterminant due to AFib.  2. Right ventricular systolic function is normal. The right ventricular size is mildly enlarged. There is mildly elevated pulmonary artery systolic pressure. The estimated right ventricular systolic pressure is 62.7 mmHg.  3. Left atrial size was severely dilated.  4. Right atrial size was severely dilated.  5. The mitral valve is grossly normal. Moderate mitral valve regurgitation.  6. Tricuspid valve regurgitation is severe.  7. The aortic valve is tricuspid. There is mild calcification of the aortic valve. There is mild thickening of the aortic valve. Aortic valve regurgitation is mild. Aortic valve sclerosis/calcification is present, without any evidence of aortic stenosis.  8. The inferior vena cava is dilated in size with <50% respiratory variability, suggesting right atrial pressure of 15 mmHg. Comparison(s): No prior Echocardiogram. FINDINGS  Left Ventricle: Left ventricular ejection fraction, by estimation, is 30 to 35%. The left ventricle has moderately decreased function. The left ventricle demonstrates global hypokinesis. The left ventricular internal cavity size was normal in size. There is no left ventricular hypertrophy. Diastolic function indeterminant due to AFib. Right Ventricle: The right ventricular size is mildly enlarged. No increase in right ventricular wall thickness. Right ventricular systolic function is normal. There is mildly elevated pulmonary artery systolic pressure. The tricuspid regurgitant velocity is 2.70 m/s, and with an assumed right atrial pressure of 15 mmHg, the estimated right ventricular systolic pressure is 03.5 mmHg. Left Atrium: Left atrial size was severely dilated. Right Atrium: Right atrial size was severely dilated. Pericardium: There is no evidence of pericardial effusion.  Mitral Valve: The mitral valve is grossly normal. There is mild thickening of the mitral valve leaflet(s). There is mild calcification of the mitral valve leaflet(s). Mild mitral annular calcification. Moderate mitral valve regurgitation. Tricuspid Valve: The tricuspid valve is normal in structure. Tricuspid valve  regurgitation is severe. Aortic Valve: The aortic valve is tricuspid. There is mild calcification of the aortic valve. There is mild thickening of the aortic valve. Aortic valve regurgitation is mild. Aortic regurgitation PHT measures 549 msec. Aortic valve sclerosis/calcification is present, without any evidence of aortic stenosis. Aortic valve peak gradient measures 6.8 mmHg. Pulmonic Valve: The pulmonic valve was normal in structure. Pulmonic valve regurgitation is mild. Aorta: The aortic root and ascending aorta are structurally normal, with no evidence of dilitation. Venous: The inferior vena cava is dilated in size with less than 50% respiratory variability, suggesting right atrial pressure of 15 mmHg. IAS/Shunts: The atrial septum is grossly normal.  LEFT VENTRICLE PLAX 2D LVIDd:         4.40 cm     Diastology LVIDs:         3.30 cm     LV e' medial:    7.94 cm/s LV PW:         0.80 cm     LV E/e' medial:  12.2 LV IVS:        0.70 cm     LV e' lateral:   11.40 cm/s LVOT diam:     2.00 cm     LV E/e' lateral: 8.5 LV SV:         36 LV SV Index:   24 LVOT Area:     3.14 cm  LV Volumes (MOD) LV vol d, MOD A2C: 76.0 ml LV vol d, MOD A4C: 82.3 ml LV vol s, MOD A2C: 38.3 ml LV vol s, MOD A4C: 43.1 ml LV SV MOD A2C:     37.7 ml LV SV MOD A4C:     82.3 ml LV SV MOD BP:      37.2 ml RIGHT VENTRICLE             IVC RV Basal diam:  4.50 cm     IVC diam: 1.50 cm RV Mid diam:    4.30 cm RV S prime:     13.10 cm/s TAPSE (M-mode): 2.0 cm LEFT ATRIUM             Index        RIGHT ATRIUM           Index LA diam:        4.10 cm 2.77 cm/m   RA Area:     32.20 cm LA Vol (A2C):   77.8 ml 52.55 ml/m  RA Volume:    121.00 ml 81.72 ml/m LA Vol (A4C):   83.6 ml 56.46 ml/m LA Biplane Vol: 89.6 ml 60.52 ml/m  AORTIC VALVE AV Area (Vmax): 2.02 cm AV Vmax:        130.50 cm/s AV Peak Grad:   6.8 mmHg LVOT Vmax:      83.90 cm/s LVOT Vmean:     57.200 cm/s LVOT VTI:       0.114 m AI PHT:         549 msec  AORTA Ao Root diam: 2.40 cm Ao Asc diam:  2.60 cm MITRAL VALVE               TRICUSPID VALVE MV Area (PHT): 4.86 cm    TR Peak grad:   29.2 mmHg MV Decel Time: 156 msec    TR Vmax:        270.00 cm/s MR Peak grad: 70.9 mmHg MR Mean grad: 49.5 mmHg    SHUNTS MR Vmax:      421.00 cm/s  Systemic VTI:  0.11 m MR Vmean:     338.5 cm/s   Systemic Diam: 2.00 cm MV E velocity: 96.90 cm/s Gwyndolyn Kaufman MD Electronically signed by Gwyndolyn Kaufman MD Signature Date/Time: 02/16/2021/1:44:26 PM    Final     Cardiac Studies   Echo 02/16/21 IMPRESSIONS     1. Left ventricular ejection fraction, by estimation, is 30 to 35%. The  left ventricle has moderately decreased function. The left ventricle  demonstrates global hypokinesis. Diastolic function indeterminant due to  AFib.   2. Right ventricular systolic function is normal. The right ventricular  size is mildly enlarged. There is mildly elevated pulmonary artery  systolic pressure. The estimated right ventricular systolic pressure is  09.9 mmHg.   3. Left atrial size was severely dilated.   4. Right atrial size was severely dilated.   5. The mitral valve is grossly normal. Moderate mitral valve  regurgitation.   6. Tricuspid valve regurgitation is severe.   7. The aortic valve is tricuspid. There is mild calcification of the  aortic valve. There is mild thickening of the aortic valve. Aortic valve  regurgitation is mild. Aortic valve sclerosis/calcification is present,  without any evidence of aortic  stenosis.   8. The inferior vena cava is dilated in size with <50% respiratory  variability, suggesting right atrial pressure of 15 mmHg.   Comparison(s): No prior  Echocardiogram.   FINDINGS   Left Ventricle: Left ventricular ejection fraction, by estimation, is 30  to 35%. The left ventricle has moderately decreased function. The left  ventricle demonstrates global hypokinesis. The left ventricular internal  cavity size was normal in size.  There is no left ventricular hypertrophy. Diastolic function indeterminant  due to AFib.   Right Ventricle: The right ventricular size is mildly enlarged. No  increase in right ventricular wall thickness. Right ventricular systolic  function is normal. There is mildly elevated pulmonary artery systolic  pressure. The tricuspid regurgitant  velocity is 2.70 m/s, and with an assumed right atrial pressure of 15  mmHg, the estimated right ventricular systolic pressure is 83.3 mmHg.   Left Atrium: Left atrial size was severely dilated.   Right Atrium: Right atrial size was severely dilated.   Pericardium: There is no evidence of pericardial effusion.   Mitral Valve: The mitral valve is grossly normal. There is mild thickening  of the mitral valve leaflet(s). There is mild calcification of the mitral  valve leaflet(s). Mild mitral annular calcification. Moderate mitral valve  regurgitation.   Tricuspid Valve: The tricuspid valve is normal in structure. Tricuspid  valve regurgitation is severe.   Aortic Valve: The aortic valve is tricuspid. There is mild calcification  of the aortic valve. There is mild thickening of the aortic valve. Aortic  valve regurgitation is mild. Aortic regurgitation PHT measures 549 msec.  Aortic valve  sclerosis/calcification is present, without any evidence of aortic  stenosis. Aortic valve peak gradient measures 6.8 mmHg.   Pulmonic Valve: The pulmonic valve was normal in structure. Pulmonic valve  regurgitation is mild.   Aorta: The aortic root and ascending aorta are structurally normal, with  no evidence of dilitation.    ECHO: 06/16/2015  Normal left ventricular  systolic function, ejection fraction > 82%   Diastolic dysfunction - grade I (normal filling pressures)   Dilated left atrium - mild   Degenerative mitral valve disease   Tricuspid regurgitation - mild   Pulmonic regurgitation - mild   Normal right ventricular systolic function   Dilated  right atrium - mild    Patient Profile     87 y.o. female with a hx of dementia, Afib w/ rate control strategy on Eliquis, MI 2017 felt 2nd stress induced w/ apical WMA at cath & no CAD, non-Hodgkin's lymphoma, IDA, osteoporosis seen for afib rvr  Assessment & Plan    POST OP A Fib with RVR.   --HR today 130s -during night 110 or so but when awake HR elevated.   --has been unable to tolerate dilt and amio but have resumed IV amio and she is tolerating .   --on eliquis 2.5 BID but missed dose last pm due to being too sleepy. --new CM from 2017 looking back at EKGs HR was in 80s in August 2022.  Son anxious for discharge.   New CM, with volume overload EF 30-35% --BNP 227 --Hypotensive on midodrine  BP still in 09Z systolic --today neg 1L (rec'd 40 mg IV lasix yesterday)  --unable to begin ACE/ARB/Entresto /spiron due to hypotension.   --does HR have a role in new CM?   Rt hip fracture s/p surgery  POD #3   Post op anemia with hgb 9.7 down from 11.7 on admit           For questions or updates, please contact [place] Please consult [email or web address] for contact info under        Signed, Cecilie Kicks, NP  02/18/2021, 8:10 AM

## 2021-02-18 NOTE — TOC Progression Note (Signed)
Transition of Care [facility]) - Progression Note    Patient Details  Name: Kathy Massey MRN: [id number] Date of Birth: 1927/03/08  Transition of Care [facility]) CM/SW [place], [place] Phone Number: 02/18/2021, 2:10 PM  Clinical Narrative:     CSW spoke with patient's daughter, [name]- CSW introduced self and explained role. Family states no questions for CSW at this time. Family remains agreeable to SNF/[facility] when medically stable.   TOC continue to follow and assist with discharge planning.   Thurmond Butts, MSW, LCSW Clinical Social Worker     Expected Discharge Plan: Skilled Nursing Facility Barriers to Discharge: Continued Medical Work up, Ship broker  Expected Discharge Plan and Services Expected Discharge Plan: [place]       Living arrangements for the past 2 months: [place]                                       Social Determinants of Health (SDOH) Interventions    Readmission Risk Interventions No flowsheet data found.

## 2021-02-18 NOTE — Progress Notes (Signed)
PROGRESS NOTE  KAITRIN SEYBOLD LGX:[id number] DOB: 13-Dec-1927 DOA: 02/13/2021 PCP: [name], MD   LOS: 5 days   Brief Narrative / Interim history: 88 year old female with history of A. fib on Eliquis, CAD, HTN, dementia, history of lymphoma who came into the hospital and was admitted on 02/13/2021 after a ground-level fall.  She was found on the floor during the morning hours, could not recall what happened.  She was found to have a hip fracture on the right side and orthopedics was consulted, she is status post right hip intertrochanteric fixation with nail on 1/2.  Hospital course complicated by A. fib with RVR with rates into the 150s for which cardiology was consulted.  Due to persistent hypotension with diltiazem as well as amiodarone she was transferred to stepdown on 1/4.  Subjective / 24h Interval events: Remains confused today.  Intermittently hypotensive overnight and received fluid bolus  Assessment & Plan: Principal Problem Persistent A. fib with RVR, hypotension,- Main issue currently, appreciate cardiology consultation.  Her hypotension complicates the picture, could not use diltiazem.  She was placed on amiodarone infusion however she became hypotensive with that.  She was started on midodrine and has been able to intermittently tolerate amiodarone. -Received Lasix yesterday but fluids overnight -2D echo done shows EF 30-35%, global hypokinesis of the LV.  Query tachycardia induced.  Appreciate cardiology follow-up  Active Problems Right hip fracture-due to mechanical fall, orthopedic surgery consulted and she is status post nail intertrochanteric hip fixation.  Per Ortho, weightbearing as tolerated.  Needs SNF once acute issues improve.  Anticoagulated with Eliquis  Hyperglycemia-no known diabetes.  Likely transient in the setting of acute stress.  Continue to monitor CBGs with morning labs  Chronic kidney disease stage IIIa-Baseline creatinine 1.1-1.2, remains at  baseline today  Acute on chronic systolic and diastolic CHF-repeat 2D echo shows EF 30-35%, moderately decreased LV function.  LV has global hypokinesis.  Management per cardiology  Underlying dementia-mixed Alzheimer and vascular.  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 She was treated with acalabrutinib for recurrent disease, stopped due to toxicity. No obvious evidence of recurrence, followed by Dr. Marin Olp   Acute postoperative blood loss anemia superimposed on normocytic anemia/anemia of chronic disease -monitor, hemoglobin remained stable  Thrombocytopenia -Likely consumptive, platelets overall stable and continue to improve today  Scheduled Meds:  apixaban  2.5 mg Oral BID   Chlorhexidine Gluconate Cloth  6 each Topical Daily   docusate sodium  100 mg Oral BID   escitalopram  5 mg Oral Daily   feeding supplement  237 mL Oral BID BM   mouth rinse  15 mL Mouth Rinse BID   midodrine  10 mg Oral TID WC   multivitamin with minerals  1 tablet Oral Daily   QUEtiapine  12.5 mg Oral QHS   Continuous Infusions:  amiodarone 30 mg/hr (02/18/21 0434)   methocarbamol (ROBAXIN) IV     PRN Meds:.acetaminophen, bisacodyl, methocarbamol **OR** methocarbamol (ROBAXIN) IV, morphine injection, ondansetron (ZOFRAN) IV, oxyCODONE, polyethylene glycol  Diet Orders (From admission, onward)     Start     Ordered   02/14/21 1307  Diet regular Room service appropriate? Yes; Fluid consistency: Thin  Diet effective now       Question Answer Comment  Room service appropriate? Yes   Fluid consistency: Thin  02/14/21 1306            DVT prophylaxis: apixaban (ELIQUIS) tablet 2.5 mg Start: 02/15/21 1115 SCDs Start: 02/13/21 1219 apixaban (ELIQUIS) tablet 2.5 mg      Code Status: DNR  Family Communication: Son present at bedside  Status is: Inpatient  Remains inpatient appropriate because: A. fib with RVR, hypotension  Level of care: Progressive  Consultants:  Cardiology  Orthopedic surgery   Procedures:  2D echo: pending  Microbiology  none  Antimicrobials: none    Objective: Vitals:   02/17/21 2338 02/18/21 0210 02/18/21 0340 02/18/21 0724  BP: 106/76 (!) 84/67 95/79 96/69   Pulse: 97 100 99 (!) 110  Resp: 20 20 18 16   Temp:   98.9 F (37.2 C) 98.1 F (36.7 C)  TempSrc:   Axillary Oral  SpO2: 95%  96% 96%  Weight:      Height:        Intake/Output Summary (Last 24 hours) at 02/18/2021 1117 Last data filed at 02/18/2021 0331 Gross per 24 hour  Intake 550 ml  Output 1375 ml  Net -825 ml    Filed Weights   02/13/21 0855 02/14/21 0911 02/16/21 0336  Weight: 49.9 kg 49.9 kg 54.1 kg    Examination:  Constitutional: Confused, appears anxious Eyes: No scleral icterus ENMT: Moist mucous membranes Neck: normal, supple Respiratory: Diminished at the bases, no wheezing Cardiovascular: Irregular, tachycardic Abdomen: Soft, NT, ND, bowel sounds positive Musculoskeletal: no clubbing / cyanosis.  Skin: No rashes seen Neurologic: No focal deficits   Data Reviewed: I have independently reviewed following labs and imaging studies   CBC: Recent Labs  Lab 02/14/21 0309 02/15/21 0352 02/16/21 0217 02/17/21 0326 02/18/21 0418  WBC 6.4 5.7 4.6 5.1 5.2  NEUTROABS  --  4.5 3.6  --   --   HGB 11.7* 10.0* 9.4* 9.4* 9.8*  HCT 37.1 30.8* 29.5* 30.3* 31.3*  MCV 98.9 99.7 99.3 101.0* 98.7  PLT 141* 112* 116* 140* 542    Basic Metabolic Panel: Recent Labs  Lab 02/14/21 0309 02/15/21 0352 02/16/21 0217 02/17/21 0326 02/18/21 0418  NA 142 138 137 135 136  K 4.1 3.8 3.7 4.2 4.1  CL 106 106 105 105 103  CO2 28 24 25 26 23   GLUCOSE 131* 95 108* 91 106*  BUN 16 16 21 23  27*  CREATININE 1.23* 1.22* 1.22* 1.16* 1.22*   CALCIUM 8.8* 8.0* 8.2* 8.3* 8.1*  MG  --  2.0 2.1 2.1  --   PHOS  --  3.6 3.8 3.5  --     Liver Function Tests: Recent Labs  Lab 02/15/21 0352 02/16/21 0217 02/17/21 0326 02/18/21 0418  AST 25 22 20 30   ALT 9 7 6  <5  ALKPHOS 84 86 88 93  BILITOT 1.2 1.5* 1.1 1.4*  PROT 4.6* 4.5* 4.7* 4.7*  ALBUMIN 2.6* 2.4* 2.4* 2.3*    Coagulation Profile: No results for input(s): INR, PROTIME in the last 168 hours. HbA1C: No results for input(s): HGBA1C in the last 72 hours. CBG: No results for input(s): GLUCAP in the last 168 hours.  Recent Results (from the past 240 hour(s))  Resp Panel by RT-PCR (Flu A&B, Covid) Nasopharyngeal Swab     Status: None   Collection Time: 02/13/21 11:44 AM   Specimen: Nasopharyngeal Swab; Nasopharyngeal(NP) swabs in vial transport medium  Result Value Ref Range Status   SARS Coronavirus 2 by RT PCR NEGATIVE NEGATIVE Final    Comment: (NOTE) SARS-CoV-2 target nucleic acids  are NOT DETECTED.  The SARS-CoV-2 RNA is generally detectable in upper respiratory specimens during the acute phase of infection. The lowest concentration of SARS-CoV-2 viral copies this assay can detect is 138 copies/mL. A negative result does not preclude SARS-Cov-2 infection and should not be used as the sole basis for treatment or other patient management decisions. A negative result may occur with  improper specimen collection/handling, submission of specimen other than nasopharyngeal swab, presence of viral mutation(s) within the areas targeted by this assay, and inadequate number of viral copies(<138 copies/mL). A negative result must be combined with clinical observations, patient history, and epidemiological information. The expected result is Negative.  Fact Sheet for Patients:  EntrepreneurPulse.com.au  Fact Sheet for Healthcare Providers:  IncredibleEmployment.be  This test is no t yet approved or cleared by the [place] FDA  and  has been authorized for detection and/or diagnosis of SARS-CoV-2 by FDA under an Emergency Use Authorization (EUA). This EUA will remain  in effect (meaning this test can be used) for the duration of the COVID-19 declaration under Section 564(b)(1) of the Act, 21 U.S.C.section 360bbb-3(b)(1), unless the authorization is terminated  or revoked sooner.       Influenza A by PCR NEGATIVE NEGATIVE Final   Influenza B by PCR NEGATIVE NEGATIVE Final    Comment: (NOTE) The Xpert Xpress SARS-CoV-2/FLU/RSV plus assay is intended as an aid in the diagnosis of influenza from Nasopharyngeal swab specimens and should not be used as a sole basis for treatment. Nasal washings and aspirates are unacceptable for Xpert Xpress SARS-CoV-2/FLU/RSV testing.  Fact Sheet for Patients: EntrepreneurPulse.com.au  Fact Sheet for Healthcare Providers: IncredibleEmployment.be  This test is not yet approved or cleared by the [place] FDA and has been authorized for detection and/or diagnosis of SARS-CoV-2 by FDA under an Emergency Use Authorization (EUA). This EUA will remain in effect (meaning this test can be used) for the duration of the COVID-19 declaration under Section 564(b)(1) of the Act, 21 U.S.C. section 360bbb-3(b)(1), unless the authorization is terminated or revoked.  Performed at [facility], [place] [address], [place], [place] [address]   Surgical pcr screen     Status: None   Collection Time: 02/13/21  8:24 PM   Specimen: Nasal Mucosa; Nasal Swab  Result Value Ref Range Status   MRSA, PCR NEGATIVE NEGATIVE Final   Staphylococcus aureus NEGATIVE NEGATIVE Final    Comment: (NOTE) The Xpert SA Assay (FDA approved for NASAL specimens in patients 75 years of age and older), is one component of a comprehensive surveillance program. It is not intended to diagnose infection nor to guide or monitor treatment. Performed at [facility],  [place] [address] [place], [place] [address]       Radiology Studies: No results found.    Marzetta Board, MD, PhD [facility]  Between 7 am - 7 pm I am available, please contact me via Amion (for emergencies) or Securechat (non urgent messages)  Between 7 pm - 7 am I am not available, please contact night coverage MD/APP via Amion

## 2021-02-19 DIAGNOSIS — I428 Other cardiomyopathies: Secondary | ICD-10-CM

## 2021-02-19 DIAGNOSIS — I4821 Permanent atrial fibrillation: Secondary | ICD-10-CM | POA: Diagnosis not present

## 2021-02-19 DIAGNOSIS — I5041 Acute combined systolic (congestive) and diastolic (congestive) heart failure: Secondary | ICD-10-CM | POA: Diagnosis not present

## 2021-02-19 DIAGNOSIS — I959 Hypotension, unspecified: Secondary | ICD-10-CM | POA: Diagnosis not present

## 2021-02-19 DIAGNOSIS — Z7189 Other specified counseling: Secondary | ICD-10-CM | POA: Diagnosis not present

## 2021-02-19 DIAGNOSIS — Z515 Encounter for palliative care: Secondary | ICD-10-CM | POA: Diagnosis not present

## 2021-02-19 DIAGNOSIS — S72141A Displaced intertrochanteric fracture of right femur, initial encounter for closed fracture: Secondary | ICD-10-CM | POA: Diagnosis not present

## 2021-02-19 LAB — CBC
HCT: 29.9 % — ABNORMAL LOW (ref 36.0–46.0)
Hemoglobin: 9.7 g/dL — ABNORMAL LOW (ref 12.0–15.0)
MCH: 31.2 pg (ref 26.0–34.0)
MCHC: 32.4 g/dL (ref 30.0–36.0)
MCV: 96.1 fL (ref 80.0–100.0)
Platelets: 195 10*3/uL (ref 150–400)
RBC: 3.11 MIL/uL — ABNORMAL LOW (ref 3.87–5.11)
RDW: 16 % — ABNORMAL HIGH (ref 11.5–15.5)
WBC: 6.1 10*3/uL (ref 4.0–10.5)
nRBC: 0 % (ref 0.0–0.2)

## 2021-02-19 LAB — COMPREHENSIVE METABOLIC PANEL
ALT: 9 U/L (ref 0–44)
AST: 29 U/L (ref 15–41)
Albumin: 2.3 g/dL — ABNORMAL LOW (ref 3.5–5.0)
Alkaline Phosphatase: 119 U/L (ref 38–126)
Anion gap: 10 (ref 5–15)
BUN: 26 mg/dL — ABNORMAL HIGH (ref 8–23)
CO2: 26 mmol/L (ref 22–32)
Calcium: 8.2 mg/dL — ABNORMAL LOW (ref 8.9–10.3)
Chloride: 101 mmol/L (ref 98–111)
Creatinine, Ser: 1.16 mg/dL — ABNORMAL HIGH (ref 0.44–1.00)
GFR, Estimated: 45 mL/min — ABNORMAL LOW (ref >60)
Glucose, Bld: 118 mg/dL — ABNORMAL HIGH (ref 70–99)
Potassium: 3.2 mmol/L — ABNORMAL LOW (ref 3.5–5.1)
Sodium: 137 mmol/L (ref 135–145)
Total Bilirubin: 1.6 mg/dL — ABNORMAL HIGH (ref 0.3–1.2)
Total Protein: 5 g/dL — ABNORMAL LOW (ref 6.5–8.1)

## 2021-02-19 LAB — MAGNESIUM: Magnesium: 1.9 mg/dL (ref 1.7–2.4)

## 2021-02-19 MED ORDER — HYALURONIDASE HUMAN 150 UNIT/ML IJ SOLN
20.0000 [IU] | INTRAMUSCULAR | Status: DC
  Administered 2021-02-19: 19.5 [IU] via SUBCUTANEOUS
  Filled 2021-02-19: qty 0.13

## 2021-02-19 MED ORDER — POTASSIUM CHLORIDE CRYS ER 20 MEQ PO TBCR
40.0000 meq | EXTENDED_RELEASE_TABLET | Freq: Two times a day (BID) | ORAL | Status: AC
  Administered 2021-02-19 (×2): 40 meq via ORAL
  Filled 2021-02-19 (×2): qty 2

## 2021-02-19 MED ORDER — HYALURONIDASE OVINE 200 UNIT/ML IJ SOLN
20.0000 [IU] | INTRAMUSCULAR | Status: DC
  Filled 2021-02-19: qty 0.1

## 2021-02-19 MED ORDER — MEMANTINE HCL 10 MG PO TABS
10.0000 mg | ORAL_TABLET | Freq: Two times a day (BID) | ORAL | Status: DC
  Administered 2021-02-19 – 2021-02-24 (×12): 10 mg via ORAL
  Filled 2021-02-19 (×12): qty 1

## 2021-02-19 MED ORDER — AMIODARONE HCL IN DEXTROSE 360-4.14 MG/200ML-% IV SOLN
30.0000 mg/h | INTRAVENOUS | Status: DC
  Administered 2021-02-20 – 2021-02-22 (×5): 30 mg/h via INTRAVENOUS
  Filled 2021-02-19 (×6): qty 200

## 2021-02-19 MED ORDER — QUETIAPINE FUMARATE 25 MG PO TABS
25.0000 mg | ORAL_TABLET | Freq: Every day | ORAL | Status: DC
  Administered 2021-02-19 – 2021-02-24 (×6): 25 mg via ORAL
  Filled 2021-02-19 (×6): qty 1

## 2021-02-19 MED ORDER — ACETAMINOPHEN 500 MG PO TABS
1000.0000 mg | ORAL_TABLET | Freq: Three times a day (TID) | ORAL | Status: DC
  Administered 2021-02-19 – 2021-02-24 (×15): 1000 mg via ORAL
  Filled 2021-02-19 (×15): qty 2

## 2021-02-19 MED ORDER — AMIODARONE HCL IN DEXTROSE 360-4.14 MG/200ML-% IV SOLN
60.0000 mg/h | INTRAVENOUS | Status: AC
  Administered 2021-02-19 – 2021-02-20 (×2): 60 mg/h via INTRAVENOUS

## 2021-02-19 MED ORDER — AMIODARONE HCL 200 MG PO TABS
200.0000 mg | ORAL_TABLET | Freq: Two times a day (BID) | ORAL | Status: DC
  Administered 2021-02-19 (×2): 200 mg via ORAL
  Filled 2021-02-19 (×2): qty 1

## 2021-02-19 MED ORDER — ATORVASTATIN CALCIUM 10 MG PO TABS
20.0000 mg | ORAL_TABLET | Freq: Every day | ORAL | Status: DC
  Administered 2021-02-19 – 2021-02-24 (×6): 20 mg via ORAL
  Filled 2021-02-19 (×6): qty 2

## 2021-02-19 NOTE — TOC CM/SW Note (Signed)
Received message from  Start, Palliative team NP,  to request writer verify patient is set up with outpatient palliative care services thru [facility] upon returning to [place].   Writer contacted [facility] hospital liaison. Spoke with Fraser Din who indicates she does not have records showing patient has outpatient palliative care services set up. Therefore, Probation officer initiated referral to [facility] for outpatient palliative care services/[facility] for Mrs. Ries when she transitions back to [place].    Patient will need "outpatient palliative follow up" ordered on dc summary.  Sheran Lawless, NP aware of above information.   Marthenia Rolling, MSN, RN,BSN Inpatient [facility] Case Manager [phone]

## 2021-02-19 NOTE — Progress Notes (Addendum)
Pt remains very anxious this pm despite all meds being given. Only alert to self, but able to convey that she feels very nervous because "my heart is beating so fast" while visibly clutching her chest. She denies any chest pain.  She also endorses shortness of breath when asked, she also states this is because "she is so nervous". RR 30-40s  Her O2 sats 96% on room air. Her BPs have been stable (120/95 at 8p, 149/103 at 10p). HR ranges from 130s-170s. Red MEWS protocol started.  Notified [place] on-call MD to restart amio gtt. Pt has been having issues w/ loss of numerous IVs, IV team consult placed. Awaiting line placement.  Will continue to monitor. Jaymes Graff, RN  [phone]: Amio gtt restarted. Pt more calm, now asleep. BP 94/76 (MAP 84), HR 120s.  0006: BP 88/71 (MAP 78). HR 115. Pt awakens easily to voice and states she feels much better. Skin warm and dry. No needs at this time. Will continue to closely monitor as pressures are softer. 0110: BP 85/63 (MAP 71). HR 98. Pressures are still soft but pt awakens easily to voice, denies any chest pain, dizziness, or SOB. LFA IV site CDI. 0335: BP 101/75 (MAP 84)- pt is awake and alert. She is very anxious and paranoid of staff. HR was climbing into the 130s. Convinced pt to take her PRNs, turned on TV to easy anxiety. HR 107.

## 2021-02-19 NOTE — Progress Notes (Signed)
PROGRESS NOTE  Kathy Massey KPT:[id number] DOB: [birth year] DOA: 02/13/2021 PCP: [name], MD   LOS: 6 days   Brief Narrative / Interim history: 88 year old female with history of A. fib on Eliquis, CAD, HTN, dementia, history of lymphoma who came into the hospital and was admitted on 02/13/2021 after a ground-level fall.  She was found on the floor during the morning hours, could not recall what happened.  She was found to have a hip fracture on the right side and orthopedics was consulted, she is status post right hip intertrochanteric fixation with nail on 1/2.  Hospital course complicated by A. fib with RVR with rates into the 150s for which cardiology was consulted.  Due to persistent hypotension with diltiazem as well as amiodarone she was transferred to stepdown on 1/4.  Subjective / 24h Interval events: Remains confused, appears anxious, does not know where she is.  Tachycardic overnight  Assessment & Plan: Principal Problem Persistent A. fib with RVR, hypotension,- Main issue currently, appreciate cardiology consultation.  Her hypotension complicates the picture, could not use diltiazem.  She has been started on midodrine, continue -Poorly rate controlled, amiodarone IV infiltrated today, she will be transitioned to oral per cardiology.  Continue to closely monitor heart rate, blood pressure -2D echo done shows EF 30-35%, global hypokinesis of the LV.  Query tachycardia induced.  Appreciate cardiology follow-up  Active Problems Right hip fracture-due to mechanical fall, orthopedic surgery consulted and she is status post nail intertrochanteric hip fixation.  Per Ortho, weightbearing as tolerated.  Needs SNF once acute issues improve.  Anticoagulated with Eliquis  Hyperglycemia-no known diabetes.  Likely transient in the setting of acute stress.  Continue to monitor CBGs with morning labs.  No further issues and CBGs acceptable  Hypokalemia-replete and monitor  Chronic kidney  disease stage IIIa-Baseline creatinine 1.1-1.2, currently at baseline  Acute on chronic systolic and diastolic CHF-repeat 2D echo shows EF 30-35%, moderately decreased LV function.  LV has global hypokinesis.  Management per cardiology  Underlying dementia-mixed Alzheimer and vascular.  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 She was treated with acalabrutinib for recurrent disease, stopped due to toxicity. No obvious evidence of recurrence, followed by Dr. Marin Olp   Acute postoperative blood loss anemia superimposed on normocytic anemia/anemia of chronic disease -monitor, hemoglobin remained stable  Thrombocytopenia -Likely consumptive, platelets overall stable and now platelets have normalized  Scheduled Meds:  acetaminophen  650 mg Oral Q8H   amiodarone  200 mg Oral BID   apixaban  2.5 mg Oral BID   atorvastatin  20 mg Oral QHS   Chlorhexidine Gluconate Cloth  6 each Topical Daily   docusate sodium  100 mg Oral BID   escitalopram  5 mg Oral Daily   feeding supplement  237 mL Oral BID BM   furosemide  40 mg Oral Daily   hyaluronidase Human  19.5 Units Subcutaneous UD   mouth rinse  15 mL Mouth Rinse BID   memantine  10 mg Oral BID   midodrine  10 mg Oral TID WC   multivitamin with minerals  1 tablet Oral Daily   potassium chloride  40 mEq Oral BID   QUEtiapine  12.5 mg Oral QHS   Continuous Infusions:  methocarbamol (ROBAXIN) IV     PRN Meds:.bisacodyl, hydrOXYzine, methocarbamol **OR** methocarbamol (ROBAXIN)  IV, morphine injection, ondansetron (ZOFRAN) IV, oxyCODONE, polyethylene glycol  Diet Orders (From admission, onward)     Start     Ordered   02/18/21 1521  Diet regular Room service appropriate? Yes; Fluid consistency: Thin; Fluid restriction: 2000  mL Fluid  Diet effective now       Question Answer Comment  Room service appropriate? Yes   Fluid consistency: Thin   Fluid restriction: 2000 mL Fluid      02/18/21 1521            DVT prophylaxis: apixaban (ELIQUIS) tablet 2.5 mg Start: 02/15/21 1115 SCDs Start: 02/13/21 1219 apixaban (ELIQUIS) tablet 2.5 mg     Code Status: DNR  Family Communication: No family at bedside  Status is: Inpatient  Remains inpatient appropriate because: A. fib with RVR, hypotension  Level of care: Progressive  Consultants:  Cardiology  Orthopedic surgery   Procedures:  2D echo  Microbiology  none  Antimicrobials: none    Objective: Vitals:   02/18/21 2308 02/19/21 0008 02/19/21 0402 02/19/21 0735  BP: (!) 138/118 111/87 (!) 98/50 102/76  Pulse: (!) 120 84 (!) 106 (!) 113  Resp: 20 (!) 24 20 20   Temp: 98.8 F (37.1 C)  98.3 F (36.8 C) 97.7 F (36.5 C)  TempSrc: Oral  Oral Oral  SpO2: 95% 96% 97% 100%  Weight:      Height:        Intake/Output Summary (Last 24 hours) at 02/19/2021 1419 Last data filed at 02/19/2021 0600 Gross per 24 hour  Intake 757.33 ml  Output 925 ml  Net -167.67 ml    Filed Weights   02/13/21 0855 02/14/21 0911 02/16/21 0336  Weight: 49.9 kg 49.9 kg 54.1 kg    Examination:  Constitutional: Confused, no apparent distress Eyes: Anicteric ENMT: mmm Neck: normal, supple Respiratory: Diminished at the bases, no wheezing Cardiovascular: Irregular, tachycardic Abdomen: Soft, NT, ND, bowel sounds positive Musculoskeletal: no clubbing / cyanosis.  Skin: No rashes Neurologic: Nonfocal, equal strength   Data Reviewed: I have independently reviewed following labs and imaging studies   CBC: Recent Labs  Lab 02/15/21 0352 02/16/21 0217 02/17/21 0326 02/18/21 0418 02/19/21 0135  WBC 5.7 4.6 5.1 5.2 6.1  NEUTROABS 4.5 3.6  --   --   --   HGB 10.0* 9.4* 9.4* 9.8* 9.7*  HCT 30.8* 29.5* 30.3* 31.3* 29.9*  MCV 99.7 99.3 101.0* 98.7 96.1  PLT  112* 116* 140* 158 253    Basic Metabolic Panel: Recent Labs  Lab 02/15/21 0352 02/16/21 0217 02/17/21 0326 02/18/21 0418 02/19/21 0135 02/19/21 1040  NA 138 137 135 136 137  --   K 3.8 3.7 4.2 4.1 3.2*  --   CL 106 105 105 103 101  --   CO2 24 25 26 23 26   --   GLUCOSE 95 108* 91 106* 118*  --   BUN 16 21 23  27* 26*  --   CREATININE 1.22* 1.22* 1.16* 1.22* 1.16*  --   CALCIUM 8.0* 8.2* 8.3* 8.1* 8.2*  --   MG 2.0 2.1 2.1  --   --  1.9  PHOS 3.6 3.8 3.5  --   --   --     Liver Function Tests: Recent Labs  Lab 02/15/21 0352 02/16/21 0217 02/17/21 0326 02/18/21 0418 02/19/21 0135  AST 25 22 20 30 29   ALT 9 7 6  <5 9  ALKPHOS 84 86 88 93 119  BILITOT 1.2 1.5* 1.1 1.4* 1.6*  PROT 4.6* 4.5* 4.7* 4.7* 5.0*  ALBUMIN 2.6* 2.4* 2.4* 2.3* 2.3*    Coagulation Profile: No results for input(s): INR, PROTIME in the last 168 hours. HbA1C: No results for input(s): HGBA1C in the last 72 hours. CBG: No results for input(s): GLUCAP in the last 168 hours.  Recent Results (from the past 240 hour(s))  Resp Panel by RT-PCR (Flu A&B, Covid) Nasopharyngeal Swab     Status: None   Collection Time: 02/13/21 11:44 AM   Specimen: Nasopharyngeal Swab; Nasopharyngeal(NP) swabs in vial transport medium  Result Value Ref Range Status   SARS Coronavirus 2 by RT PCR NEGATIVE NEGATIVE Final    Comment: (NOTE) SARS-CoV-2 target nucleic acids are NOT DETECTED.  The SARS-CoV-2 RNA is generally detectable in upper respiratory specimens during the acute phase of infection. The lowest concentration of SARS-CoV-2 viral copies this assay can detect is 138 copies/mL. A negative result does not preclude SARS-Cov-2 infection and should not be used as the sole basis for treatment or other patient management decisions. A negative result may occur with  improper specimen collection/handling, submission of specimen other than nasopharyngeal swab, presence of viral mutation(s) within the areas targeted by  this assay, and inadequate number of viral copies(<138 copies/mL). A negative result must be combined with clinical observations, patient history, and epidemiological information. The expected result is Negative.  Fact Sheet for Patients:  EntrepreneurPulse.com.au  Fact Sheet for Healthcare Providers:  IncredibleEmployment.be  This test is no t yet approved or cleared by the [place] FDA and  has been authorized for detection and/or diagnosis of SARS-CoV-2 by FDA under an Emergency Use Authorization (EUA). This EUA will remain  in effect (meaning this test can be used) for the duration of the COVID-19 declaration under Section 564(b)(1) of the Act, 21 U.S.C.section 360bbb-3(b)(1), unless the authorization is terminated  or revoked sooner.       Influenza A by PCR NEGATIVE NEGATIVE Final   Influenza B by PCR NEGATIVE NEGATIVE Final    Comment: (NOTE) The Xpert Xpress SARS-CoV-2/FLU/RSV plus assay is intended as an aid in the diagnosis of influenza from Nasopharyngeal swab specimens and should not be used as a sole basis for treatment. Nasal washings and aspirates are unacceptable for Xpert Xpress SARS-CoV-2/FLU/RSV testing.  Fact Sheet for Patients: EntrepreneurPulse.com.au  Fact Sheet for Healthcare Providers: IncredibleEmployment.be  This test is not yet approved or cleared by the [place] FDA and has been authorized for detection and/or diagnosis of SARS-CoV-2 by FDA under an Emergency Use Authorization (EUA). This EUA will remain in effect (meaning this test can be used) for the duration of the COVID-19 declaration under Section 564(b)(1) of the Act, 21 U.S.C. section 360bbb-3(b)(1), unless the authorization is terminated or revoked.  Performed at [facility], [place] [address] [place], [place] [address]   Surgical pcr screen     Status: None   Collection Time: 02/13/21  8:24 PM    Specimen: Nasal Mucosa; Nasal Swab  Result Value Ref Range Status   MRSA, PCR NEGATIVE NEGATIVE Final   Staphylococcus aureus NEGATIVE NEGATIVE Final    Comment: (NOTE) The Xpert SA Assay (FDA approved for NASAL specimens in patients 59 years of age and older), is one component of a comprehensive surveillance program. It is not intended to diagnose infection nor to guide or monitor treatment. Performed at [facility], [place] [address], [place], [place] [address]       Radiology Studies: No results found.  Marzetta Board, MD, PhD [facility]  Between 7 am - 7 pm I am available, please contact me via Amion (for emergencies) or Securechat (non urgent messages)  Between 7 pm - 7 am I am not available, please contact night coverage MD/APP via Amion

## 2021-02-19 NOTE — Progress Notes (Signed)
Alerted that patient's IV has infiltrated. Asking if we can change amiodarone to oral. I think it is reasonable to attempt this, but I expect that her heart rate will increase significantly on oral dosing.  Would start amiodarone 200 mg BID orally. If afib rates sustained >120 bpm, will need to restart IV amiodarone drip. Only additional option is digoxin given her blood pressure.  Buford Dresser, MD, PhD, [facility] [address] [place], [place] [address] [phone]

## 2021-02-19 NOTE — Progress Notes (Signed)
Progress Note  Patient Name: Kathy Massey Date of Encounter: 02/19/2021  [facility] Cardiologist: Will Meredith Leeds, MD   Subjective   No acute events overnight. Resting comfortably in bed this AM, no concerns. No family at bedside.  Inpatient Medications    Scheduled Meds:  acetaminophen  650 mg Oral Q8H   apixaban  2.5 mg Oral BID   Chlorhexidine Gluconate Cloth  6 each Topical Daily   docusate sodium  100 mg Oral BID   escitalopram  5 mg Oral Daily   feeding supplement  237 mL Oral BID BM   furosemide  40 mg Oral Daily   hyaluronidase ovine  20 Units Subcutaneous UD   mouth rinse  15 mL Mouth Rinse BID   midodrine  10 mg Oral TID WC   multivitamin with minerals  1 tablet Oral Daily   QUEtiapine  12.5 mg Oral QHS   Continuous Infusions:  amiodarone 30 mg/hr (02/19/21 0408)   methocarbamol (ROBAXIN) IV     PRN Meds: bisacodyl, hydrOXYzine, methocarbamol **OR** methocarbamol (ROBAXIN) IV, morphine injection, ondansetron (ZOFRAN) IV, oxyCODONE, polyethylene glycol   Vital Signs    Vitals:   02/18/21 2308 02/19/21 0008 02/19/21 0402 02/19/21 0735  BP: (!) 138/118 111/87 (!) 98/50 102/76  Pulse: (!) 120 84 (!) 106 (!) 113  Resp: 20 (!) 24 20 20   Temp: 98.8 F (37.1 C)  98.3 F (36.8 C) 97.7 F (36.5 C)  TempSrc: Oral  Oral Oral  SpO2: 95% 96% 97% 100%  Weight:      Height:        Intake/Output Summary (Last 24 hours) at 02/19/2021 1021 Last data filed at 02/19/2021 0600 Gross per 24 hour  Intake 807.33 ml  Output 925 ml  Net -117.67 ml   Last 3 Weights 02/16/2021 02/14/2021 02/13/2021  Weight (lbs) 119 lb 4.3 oz 110 lb 110 lb  Weight (kg) 54.1 kg 49.896 kg 49.896 kg      Telemetry    Atrial fibrillation with RVR - Personally Reviewed  ECG    No new since 02/13/21 - Personally Reviewed  Physical Exam   GEN: No acute distress. Frail appearing elderly woman  Neck: JVD low neck at 45 degrees Cardiac: irregularly irregular S1/S2, tachycardic. 2/6  systolic murmur Respiratory: diffuse mild coarseness, slightly improved GI: Soft, nontender, non-distended  MS: No edema Neuro:  Nonfocal  Psych: Normal affect   Labs    High Sensitivity Troponin:  No results for input(s): TROPONINIHS in the last 720 hours.   Chemistry Recent Labs  Lab 02/15/21 0352 02/16/21 0217 02/17/21 0326 02/18/21 0418 02/19/21 0135  NA 138 137 135 136 137  K 3.8 3.7 4.2 4.1 3.2*  CL 106 105 105 103 101  CO2 24 25 26 23 26   GLUCOSE 95 108* 91 106* 118*  BUN 16 21 23  27* 26*  CREATININE 1.22* 1.22* 1.16* 1.22* 1.16*  CALCIUM 8.0* 8.2* 8.3* 8.1* 8.2*  MG 2.0 2.1 2.1  --   --   PROT 4.6* 4.5* 4.7* 4.7* 5.0*  ALBUMIN 2.6* 2.4* 2.4* 2.3* 2.3*  AST 25 22 20 30 29   ALT 9 7 6  <5 9  ALKPHOS 84 86 88 93 119  BILITOT 1.2 1.5* 1.1 1.4* 1.6*  GFRNONAA 42* 42* 45* 42* 45*  ANIONGAP 8 7 4* 10 10    Lipids No results for input(s): CHOL, TRIG, HDL, LABVLDL, LDLCALC, CHOLHDL in the last 168 hours.  Hematology Recent Labs  Lab 02/17/21 [phone]  02/18/21 0418 02/19/21 0135  WBC 5.1 5.2 6.1  RBC 3.00* 3.17* 3.11*  HGB 9.4* 9.8* 9.7*  HCT 30.3* 31.3* 29.9*  MCV 101.0* 98.7 96.1  MCH 31.3 30.9 31.2  MCHC 31.0 31.3 32.4  RDW 16.1* 15.9* 16.0*  PLT 140* 158 195   Thyroid No results for input(s): TSH, FREET4 in the last 168 hours.  BNP Recent Labs  Lab 02/15/21 1554  BNP 227.9*    DDimer No results for input(s): DDIMER in the last 168 hours.   Radiology    No results found.  Cardiac Studies   Echo 02/16/21  1. Left ventricular ejection fraction, by estimation, is 30 to 35%. The  left ventricle has moderately decreased function. The left ventricle  demonstrates global hypokinesis. Diastolic function indeterminant due to  AFib.   2. Right ventricular systolic function is normal. The right ventricular  size is mildly enlarged. There is mildly elevated pulmonary artery  systolic pressure. The estimated right ventricular systolic pressure is  44.0 mmHg.    3. Left atrial size was severely dilated.   4. Right atrial size was severely dilated.   5. The mitral valve is grossly normal. Moderate mitral valve  regurgitation.   6. Tricuspid valve regurgitation is severe.   7. The aortic valve is tricuspid. There is mild calcification of the  aortic valve. There is mild thickening of the aortic valve. Aortic valve  regurgitation is mild. Aortic valve sclerosis/calcification is present,  without any evidence of aortic  stenosis.   8. The inferior vena cava is dilated in size with <50% respiratory  variability, suggesting right atrial pressure of 15 mmHg.   ECHO: 06/16/2015  Normal left ventricular systolic function, ejection fraction > 10%   Diastolic dysfunction - grade I (normal filling pressures)   Dilated left atrium - mild   Degenerative mitral valve disease   Tricuspid regurgitation - mild   Pulmonic regurgitation - mild   Normal right ventricular systolic function   Dilated right atrium - mild  CATH 06/17/2015 Findings:  1. No angiographically apparent coronary artery disease.  2. Normal left ventricular filling pressures (LVEDP = 9 mm Hg).  3. Borderline left ventricular contraction with apical akinesis,  suggestive of stress-induced cardiomyopathy.   Recommendations:  1. Cardiovascular risk reduction.  2. Follow up with primary cardiologist.   Patient Profile     88 y.o. female with PMH permanent atrial fibrillation on apixaban, history of stress induced cardiomyopathy in 2017 with normal cors and EF >55%, non-Hodgkins lymphoma who presented with right hip fracture. Cardiology following for management of atrial fibrillation with RVR. Found to have reduced EF this admission.  Assessment & Plan    Acute systolic and diastolic heart failure Cardiomyopathy Hypotension -likely nonischemic, given normal cors on cath in 2017 (records in [place]) -EF 30-35% on echo this admission, with severe biatrial enlargement,  moderate MR, severe TR, RVSP 44 mmHg, IVC dilated, RAP 15 mmHg -on midodrine for hypotension and still with intermittent lows. No BP room for guideline directed medical therapy for cardiomyopathy -see extensive discussion 02/18/21. Tenuous balance between blood pressure, volume status, and heart rate. Aiming for conservative oral management that will allow her to go to rehab facility and recover from her hip surgery -family understands that it is unlikely that we will be able to optimize her heart rate, blood pressure, and volume status. We will aim for the balance that makes her feel the best. -tolerating oral furosemide, slight improvement in volume status without severe  drop in blood pressure. Continue   Atrial fibrillation with RVR -no blood pressure room for other agents, attempting rate control with IV amiodarone. -on apixaban 2.5 mg BID (reduced for age, weight)  For questions or updates, please contact [facility] Please consult [email or web address] for contact info under        Signed, Buford Dresser, MD  02/19/2021, 10:21 AM

## 2021-02-19 NOTE — Progress Notes (Signed)
Notified by RN that patient's amiodarone IV has infiltrated. Reportedly, has had 6 PIVs with 2 infiltrated and 2 pulled by patient. HR in the 90-110s, SBP 100s. Amiodarone is the only drip she has running. Will convert this to PO amiodarone - 200 mg BID for now.

## 2021-02-19 NOTE — Progress Notes (Addendum)
Palliative Medicine Inpatient Follow Up Note  Consulting Provider: Caren Griffins, MD   Reason for consult:   [facility] Palliative Medicine Consult  Reason for Consult? GOC    HPI:  Per intake H&P --> 88 year old female with history of A. fib on Eliquis, CAD, HTN, dementia, history of lymphoma who came into the hospital and was admitted on 02/13/2021 after a ground-level fall.  She was found on the floor during the morning hours, could not recall what happened.  She was found to have a hip fracture on the right side and orthopedics was consulted, she is status post right hip intertrochanteric fixation with nail on 1/2.  Hospital course complicated by A. fib with RVR with rates into the 150s for which cardiology was consulted.  Due to persistent hypotension with diltiazem as well as amiodarone she was transferred to stepdown on 1/4.   Palliative care has been asked to get involved to discuss goals of care post surgery.   Today's Discussion (02/19/2021):  *Please note that this is a verbal dictation therefore any spelling or grammatical errors are due to the "[employer]" system interpretation.  Chart reviewed inclusive of progress notes, laboratory results, and diagnostic images.   I met with Thayer Headings at bedside. Her HR remains increased on her bedside monitor. She expresses the desire to "get out of bed". I educated Arnetra that she cannot right now. She appears to have pain when trying to maneuver herself in the bed. Was able to redirect and distract.  ________________________________________________ Addendum:  I went by Hazleigh's room later in the day. I met with patients daughter, [name] at bedside. We reviewed that Kalese has had difficult to control Afib for years now and that this is nothing new per [name].   Patients RN, Frederico Hamman and I were able to get Roanne out of bed to sit in the chair. He daughter was able to assist in wheeling her around the unit.   We  spent a good deal of time discussing polypharmacy concerns in the setting of Kaaren's prior dizzy spells. I shared that I would request pharmacy does a thorough medication review.   We reviewed the hope that Regenia can rehabilitate though if unable to tolerate this that the focus would change to comfort and a hospice emphasis of care. I shared my concerns in the setting of patients continued depletion of adequate PO intake. Her daughter is hopeful that once back at [facility] surrounded by more vocal people that she may start eating more.   Questions and concerns addressed   Palliative Support Provided.   Objective Assessment: Vital Signs Vitals:   02/19/21 0402 02/19/21 0735  BP: (!) 98/50 102/76  Pulse: (!) 106 (!) 113  Resp: 20 20  Temp: 98.3 F (36.8 C) 97.7 F (36.5 C)  SpO2: 97% 100%    Intake/Output Summary (Last 24 hours) at 02/19/2021 1221 Last data filed at 02/19/2021 0600 Gross per 24 hour  Intake 757.33 ml  Output 925 ml  Net -167.67 ml   Last Weight  Most recent update: 02/16/2021  3:36 AM    Weight  54.1 kg (119 lb 4.3 oz)            Gen:  Elderly Frail Caucasian F in NAD HEENT: moist mucous membranes CV: Irregular rate and rhythm  PULM: On RA ABD: soft/nontender  EXT: No edema  Neuro: Alert and oriented x3   SUMMARY OF RECOMMENDATIONS   DNAR/DNI    Patient family  would like to be medically optimized   Plan to transition to [facility] - if not thriving they are open to hospice through [facility] OP Palliative support through [facility] ([facility])   Ongoing PMT support   Code Status/Advance Care Planning: DNAR/DNI   Symptom Management:  Right Hip Pain: - Tylenol 1g PO Q8H  MDM - High in the setting of ongoing afib w. RVR, s/p right hip fax repair, dementia. Conversations on escalation versus de-escalation of care.  ______________________________________________________________________________________ [facility] Team Team Cell Phone: [phone] Please utilize secure chat with additional questions, if there is no response within 30 minutes please call the above phone number  Palliative Medicine Team providers are available by phone from 7am to 7pm daily and can be reached through the team cell phone.  Should this patient require assistance outside of these hours, please call the patient's attending physician.

## 2021-02-19 NOTE — Progress Notes (Signed)
MEDICATION RELATED CONSULT NOTE - INITIAL   Pharmacy Consult for review meds for possible polypharmacy  Allergies  Allergen Reactions   Shellfish Allergy Itching and Swelling    Shrimp     Patient Measurements: Height: 4\' 11"  (149.9 cm) Weight: 54.1 kg (119 lb 4.3 oz) IBW/kg (Calculated) : 43.2  Vital Signs: Temp: 97.8 F (36.6 C) (01/07 1714) Temp Source: Oral (01/07 1714) BP: 102/76 (01/07 0735) Pulse Rate: 109 (01/07 1714) Intake/Output from previous day: 01/06 0701 - 01/07 0700 In: 857.3 [P.O.:300; I.V.:557.3] Out: 1225 [Urine:1225] Intake/Output from this shift: Total I/O In: -  Out: 575 [Urine:575]  Labs: Recent Labs    02/17/21 0326 02/18/21 0418 02/19/21 0135 02/19/21 1040  WBC 5.1 5.2 6.1  --   HGB 9.4* 9.8* 9.7*  --   HCT 30.3* 31.3* 29.9*  --   PLT 140* 158 195  --   CREATININE 1.16* 1.22* 1.16*  --   MG 2.1  --   --  1.9  PHOS 3.5  --   --   --   ALBUMIN 2.4* 2.3* 2.3*  --   PROT 4.7* 4.7* 5.0*  --   AST 20 30 29   --   ALT 6 <5 9  --   ALKPHOS 88 93 119  --   BILITOT 1.1 1.4* 1.6*  --    Estimated Creatinine Clearance: 23.7 mL/min (A) (by C-G formula based on SCr of 1.16 mg/dL (H)).   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   02/12/2021 at pm   apixaban (ELIQUIS) 2.5 MG TABS tablet TAKE 1 TABLET BY MOUTH TWICE A DAY (Patient taking differently: Take 2.5 mg by mouth 2 (two) times daily.) 60 tablet 5 02/12/2021 at 2120   atorvastatin (LIPITOR) 20 MG tablet Take 20 mg by mouth at bedtime.   02/12/2021 at pm   cyanocobalamin  1000 MCG tablet Take 1,000 mcg by mouth every morning.   02/12/2021 at am   diltiazem (CARDIZEM SR) 60 MG 12 hr capsule Take 1 tablet (60 mg total) once daily.  You may take a second tablet daily if needed for blood pressure and/or heart rate. (Patient taking differently: 60 mg every morning.) 60 capsule 2 02/12/2021 at am   escitalopram (LEXAPRO) 5 MG tablet Take 5 mg by mouth every morning.   02/12/2021 at am   fluocinonide ointment (LIDEX) 8.14 % Apply 1 application topically See admin instructions. Order date 02/02/21 - apply topically to scalp twice daily Monday thru Friday until healed   02/11/2021   memantine (NAMENDA) 10 MG tablet Take 10 mg by mouth 2 (two) times daily. For memory   02/12/2021 at pm   Scheduled:   acetaminophen  1,000 mg Oral Q8H   amiodarone  200 mg Oral BID   apixaban  2.5 mg Oral BID   atorvastatin  20 mg Oral QHS   Chlorhexidine Gluconate Cloth  6 each Topical Daily   docusate sodium  100 mg Oral BID   escitalopram  5 mg Oral Daily  feeding supplement  237 mL Oral BID BM   furosemide  40 mg Oral Daily   hyaluronidase Human  19.5 Units Subcutaneous UD   mouth rinse  15 mL Mouth Rinse BID   memantine  10 mg Oral BID   midodrine  10 mg Oral TID WC   multivitamin with minerals  1 tablet Oral Daily   potassium chloride  40 mEq Oral BID   QUEtiapine  25 mg Oral QHS    Assessment: 88 year old female with history of A. fib on Eliquis, CAD, HTN, dementia, history of lymphoma who came into the hospital and was admitted on 02/13/2021 after a ground-level fall.   Palliative medicine has asked [place] to review medications for possible polypharmacy. I have reviewed medications with daughter over the phone this evening. I conveyed that I felt she was being appropriately managed on medications and we both agreed that she was on very few medications given her advanced age and she was very fortunate. We discussed how her medication list was still changing and things would be more  clear when we were closer to discharge.   At this time I didn't have any recommendations on changes to medications. All questions were answered. Pharmacy will continue to follow peripherally. I told [name] we would be happy to rediscuss things in person later if needed.     Erin Hearing PharmD., BCPS Clinical Pharmacist 02/19/2021 5:49 PM

## 2021-02-20 DIAGNOSIS — S72141A Displaced intertrochanteric fracture of right femur, initial encounter for closed fracture: Secondary | ICD-10-CM | POA: Diagnosis not present

## 2021-02-20 DIAGNOSIS — E8779 Other fluid overload: Secondary | ICD-10-CM | POA: Diagnosis not present

## 2021-02-20 DIAGNOSIS — Z7189 Other specified counseling: Secondary | ICD-10-CM

## 2021-02-20 DIAGNOSIS — I428 Other cardiomyopathies: Secondary | ICD-10-CM | POA: Diagnosis not present

## 2021-02-20 DIAGNOSIS — Z515 Encounter for palliative care: Secondary | ICD-10-CM | POA: Diagnosis not present

## 2021-02-20 DIAGNOSIS — I4821 Permanent atrial fibrillation: Secondary | ICD-10-CM | POA: Diagnosis not present

## 2021-02-20 DIAGNOSIS — I5041 Acute combined systolic (congestive) and diastolic (congestive) heart failure: Secondary | ICD-10-CM | POA: Diagnosis not present

## 2021-02-20 DIAGNOSIS — I959 Hypotension, unspecified: Secondary | ICD-10-CM | POA: Diagnosis not present

## 2021-02-20 LAB — CBC
HCT: 30.1 % — ABNORMAL LOW (ref 36.0–46.0)
Hemoglobin: 9.6 g/dL — ABNORMAL LOW (ref 12.0–15.0)
MCH: 30.9 pg (ref 26.0–34.0)
MCHC: 31.9 g/dL (ref 30.0–36.0)
MCV: 96.8 fL (ref 80.0–100.0)
Platelets: 201 10*3/uL (ref 150–400)
RBC: 3.11 MIL/uL — ABNORMAL LOW (ref 3.87–5.11)
RDW: 15.9 % — ABNORMAL HIGH (ref 11.5–15.5)
WBC: 6.8 10*3/uL (ref 4.0–10.5)
nRBC: 0 % (ref 0.0–0.2)

## 2021-02-20 LAB — COMPREHENSIVE METABOLIC PANEL
ALT: 13 U/L (ref 0–44)
AST: 50 U/L — ABNORMAL HIGH (ref 15–41)
Albumin: 2.2 g/dL — ABNORMAL LOW (ref 3.5–5.0)
Alkaline Phosphatase: 184 U/L — ABNORMAL HIGH (ref 38–126)
Anion gap: 9 (ref 5–15)
BUN: 24 mg/dL — ABNORMAL HIGH (ref 8–23)
CO2: 25 mmol/L (ref 22–32)
Calcium: 8.2 mg/dL — ABNORMAL LOW (ref 8.9–10.3)
Chloride: 100 mmol/L (ref 98–111)
Creatinine, Ser: 1.13 mg/dL — ABNORMAL HIGH (ref 0.44–1.00)
GFR, Estimated: 46 mL/min — ABNORMAL LOW (ref >60)
Glucose, Bld: 96 mg/dL (ref 70–99)
Potassium: 4.4 mmol/L (ref 3.5–5.1)
Sodium: 134 mmol/L — ABNORMAL LOW (ref 135–145)
Total Bilirubin: 1.4 mg/dL — ABNORMAL HIGH (ref 0.3–1.2)
Total Protein: 4.9 g/dL — ABNORMAL LOW (ref 6.5–8.1)

## 2021-02-20 MED ORDER — GLYCERIN (LAXATIVE) 2.1 G RE SUPP
1.0000 | Freq: Every day | RECTAL | Status: DC | PRN
  Filled 2021-02-20: qty 1

## 2021-02-20 MED ORDER — POLYETHYLENE GLYCOL 3350 17 G PO PACK
17.0000 g | PACK | Freq: Two times a day (BID) | ORAL | Status: DC
  Administered 2021-02-20 – 2021-02-24 (×8): 17 g via ORAL
  Filled 2021-02-20 (×8): qty 1

## 2021-02-20 NOTE — Progress Notes (Signed)
Mobility Specialist Progress Note:   02/20/21 1525  Mobility  Activity Transferred to/from [facility]  Level of Assistance Maximum assist, patient does 25-49%  Assistive Device None  Mobility Out of bed for toileting  Mobility Response Tolerated fair  Mobility performed by Mobility specialist  $Mobility charge 1 Mobility   Pt received in bed wanting to go to chair but requesting to go to bathroom before. Max A to pivot to [facility]. Max A to pivot back to bed. Pt left in bed with call bell in reach and all needs met.   [facility] Public librarian Phone [phone] Secondary Phone [phone]

## 2021-02-20 NOTE — Progress Notes (Signed)
PROGRESS NOTE  Kathy Massey WHQ:[id number] DOB: 1927-06-28 DOA: 02/13/2021 PCP: [name], MD   LOS: 7 days   Brief Narrative / Interim history: 88 year old female with history of A. fib on Eliquis, CAD, HTN, dementia, history of lymphoma who came into the hospital and was admitted on 02/13/2021 after a ground-level fall.  She was found on the floor during the morning hours, could not recall what happened.  She was found to have a hip fracture on the right side and orthopedics was consulted, she is status post right hip intertrochanteric fixation with nail on 1/2.  Hospital course complicated by A. fib with RVR with rates into the 150s for which cardiology was consulted.  Due to persistent hypotension with diltiazem as well as amiodarone she was transferred to stepdown on 1/4.  Subjective / 24h Interval events: Remains confused, anxious this morning.  Tachypneic but denies any shortness of breath  Assessment & Plan: Principal Problem Persistent A. fib with RVR, hypotension,- Main issue currently, appreciate cardiology consultation.  Her hypotension complicates the picture, could not use diltiazem.  She has been started on midodrine, continue -Poorly rate controlled, continue amiodarone per cardiology -2D echo done shows EF 30-35%, global hypokinesis of the LV.  Query tachycardia induced.  Appreciate cardiology follow-up  Active Problems Right hip fracture-due to mechanical fall, orthopedic surgery consulted and she is status post nail intertrochanteric hip fixation.  Per Ortho, weightbearing as tolerated.  Needs SNF once acute issues improve.  Anticoagulated with Eliquis  Hyperglycemia-no known diabetes.  Likely transient in the setting of acute stress.  Continue to monitor CBGs with morning labs.  CBG acceptable this morning  Hypokalemia-replete and monitor  Chronic kidney disease stage IIIa-Baseline creatinine 1.1-1.2, currently at baseline  Acute on chronic systolic and  diastolic CHF-repeat 2D echo shows EF 30-35%, moderately decreased LV function.  LV has global hypokinesis.  Management per cardiology  Underlying dementia-mixed Alzheimer and vascular.  Monitor  Goals of care-she is DNR.  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 She was treated with acalabrutinib for recurrent disease, stopped due to toxicity. No obvious evidence of recurrence, followed by Dr. Marin Olp   Acute postoperative blood loss anemia superimposed on normocytic anemia/anemia of chronic disease -monitor, hemoglobin remained stable  Thrombocytopenia -Likely consumptive, platelets overall stable and now platelets have normalized, stable today  Scheduled Meds:  acetaminophen  1,000 mg Oral Q8H   apixaban  2.5 mg Oral BID   atorvastatin  20 mg Oral QHS   Chlorhexidine Gluconate Cloth  6 each Topical Daily   docusate sodium  100 mg Oral BID   escitalopram  5 mg Oral Daily   feeding supplement  237 mL Oral BID BM   furosemide  40 mg Oral Daily   hyaluronidase Human  19.5 Units Subcutaneous UD   mouth rinse  15 mL Mouth Rinse BID   memantine  10 mg Oral BID   midodrine  10 mg Oral TID WC   multivitamin with minerals  1 tablet Oral Daily   QUEtiapine  25 mg Oral QHS   Continuous Infusions:  amiodarone 30 mg/hr (02/20/21 0655)   methocarbamol (ROBAXIN) IV     PRN Meds:.bisacodyl, hydrOXYzine, methocarbamol **OR** methocarbamol (ROBAXIN) IV, morphine injection, ondansetron (ZOFRAN) IV, oxyCODONE, polyethylene glycol  Diet Orders (From admission, onward)     Start  Ordered   02/18/21 1521  Diet regular Room service appropriate? Yes; Fluid consistency: Thin; Fluid restriction: 2000 mL Fluid  Diet effective now       Question Answer Comment  Room service appropriate? Yes    Fluid consistency: Thin   Fluid restriction: 2000 mL Fluid      02/18/21 1521            DVT prophylaxis: apixaban (ELIQUIS) tablet 2.5 mg Start: 02/15/21 1115 SCDs Start: 02/13/21 1219 apixaban (ELIQUIS) tablet 2.5 mg     Code Status: DNR  Family Communication: No family at bedside  Status is: Inpatient  Remains inpatient appropriate because: A. fib with RVR, hypotension  Level of care: Progressive  Consultants:  Cardiology  Orthopedic surgery   Procedures:  2D echo  Microbiology  none  Antimicrobials: none    Objective: Vitals:   02/20/21 0204 02/20/21 0335 02/20/21 0645 02/20/21 0834  BP: (!) 85/67 101/75 (!) 123/103 121/85  Pulse: (!) 106   (!) 120  Resp: 20 20 18 20   Temp:  98 F (36.7 C)  97.9 F (36.6 C)  TempSrc:  Oral  Oral  SpO2: 98% 93%  94%  Weight:      Height:        Intake/Output Summary (Last 24 hours) at 02/20/2021 1209 Last data filed at 02/20/2021 0655 Gross per 24 hour  Intake 413 ml  Output 900 ml  Net -487 ml    Filed Weights   02/13/21 0855 02/14/21 0911 02/16/21 0336  Weight: 49.9 kg 49.9 kg 54.1 kg    Examination:  Constitutional: Confused, NAD but tachypneic upon waking up Eyes: No scleral icterus ENMT: Moist mucous membranes Neck: normal, supple Respiratory: Diminished at the bases, no wheezing Cardiovascular: Irregular, tachycardic Abdomen: Soft, NT, ND, bowel sounds positive Musculoskeletal: no clubbing / cyanosis.  Skin: No rashes seen Neurologic: No focal deficits   Data Reviewed: I have independently reviewed following labs and imaging studies   CBC: Recent Labs  Lab 02/15/21 0352 02/16/21 0217 02/17/21 0326 02/18/21 0418 02/19/21 0135 02/20/21 0135  WBC 5.7 4.6 5.1 5.2 6.1 6.8  NEUTROABS 4.5 3.6  --   --   --   --   HGB 10.0* 9.4* 9.4* 9.8* 9.7* 9.6*  HCT 30.8* 29.5* 30.3* 31.3* 29.9* 30.1*  MCV 99.7 99.3 101.0* 98.7 96.1 96.8  PLT 112* 116* 140* 158 195 518    Basic Metabolic  Panel: Recent Labs  Lab 02/15/21 0352 02/16/21 0217 02/17/21 0326 02/18/21 0418 02/19/21 0135 02/19/21 1040 02/20/21 0135  NA 138 137 135 136 137  --  134*  K 3.8 3.7 4.2 4.1 3.2*  --  4.4  CL 106 105 105 103 101  --  100  CO2 24 25 26 23 26   --  25  GLUCOSE 95 108* 91 106* 118*  --  96  BUN 16 21 23  27* 26*  --  24*  CREATININE 1.22* 1.22* 1.16* 1.22* 1.16*  --  1.13*  CALCIUM 8.0* 8.2* 8.3* 8.1* 8.2*  --  8.2*  MG 2.0 2.1 2.1  --   --  1.9  --   PHOS 3.6 3.8 3.5  --   --   --   --     Liver Function Tests: Recent Labs  Lab 02/16/21 0217 02/17/21 0326 02/18/21 0418 02/19/21 0135 02/20/21 0135  AST 22 20 30 29  50*  ALT 7 6 5 9 13   ALKPHOS 86 88 93 119 184*  BILITOT 1.5*  1.1 1.4* 1.6* 1.4*  PROT 4.5* 4.7* 4.7* 5.0* 4.9*  ALBUMIN 2.4* 2.4* 2.3* 2.3* 2.2*    Coagulation Profile: No results for input(s): INR, PROTIME in the last 168 hours. HbA1C: No results for input(s): HGBA1C in the last 72 hours. CBG: No results for input(s): GLUCAP in the last 168 hours.  Recent Results (from the past 240 hour(s))  Resp Panel by RT-PCR (Flu A&B, Covid) Nasopharyngeal Swab     Status: None   Collection Time: 02/13/21 11:44 AM   Specimen: Nasopharyngeal Swab; Nasopharyngeal(NP) swabs in vial transport medium  Result Value Ref Range Status   SARS Coronavirus 2 by RT PCR NEGATIVE NEGATIVE Final    Comment: (NOTE) SARS-CoV-2 target nucleic acids are NOT DETECTED.  The SARS-CoV-2 RNA is generally detectable in upper respiratory specimens during the acute phase of infection. The lowest concentration of SARS-CoV-2 viral copies this assay can detect is 138 copies/mL. A negative result does not preclude SARS-Cov-2 infection and should not be used as the sole basis for treatment or other patient management decisions. A negative result may occur with  improper specimen collection/handling, submission of specimen other than nasopharyngeal swab, presence of viral mutation(s) within  the areas targeted by this assay, and inadequate number of viral copies(<138 copies/mL). A negative result must be combined with clinical observations, patient history, and epidemiological information. The expected result is Negative.  Fact Sheet for Patients:  EntrepreneurPulse.com.au  Fact Sheet for Healthcare Providers:  IncredibleEmployment.be  This test is no t yet approved or cleared by the [place] FDA and  has been authorized for detection and/or diagnosis of SARS-CoV-2 by FDA under an Emergency Use Authorization (EUA). This EUA will remain  in effect (meaning this test can be used) for the duration of the COVID-19 declaration under Section 564(b)(1) of the Act, 21 U.S.C.section 360bbb-3(b)(1), unless the authorization is terminated  or revoked sooner.       Influenza A by PCR NEGATIVE NEGATIVE Final   Influenza B by PCR NEGATIVE NEGATIVE Final    Comment: (NOTE) The Xpert Xpress SARS-CoV-2/FLU/RSV plus assay is intended as an aid in the diagnosis of influenza from Nasopharyngeal swab specimens and should not be used as a sole basis for treatment. Nasal washings and aspirates are unacceptable for Xpert Xpress SARS-CoV-2/FLU/RSV testing.  Fact Sheet for Patients: EntrepreneurPulse.com.au  Fact Sheet for Healthcare Providers: IncredibleEmployment.be  This test is not yet approved or cleared by the [place] FDA and has been authorized for detection and/or diagnosis of SARS-CoV-2 by FDA under an Emergency Use Authorization (EUA). This EUA will remain in effect (meaning this test can be used) for the duration of the COVID-19 declaration under Section 564(b)(1) of the Act, 21 U.S.C. section 360bbb-3(b)(1), unless the authorization is terminated or revoked.  Performed at [facility], [place] [address], [place], [place] [address]   Surgical pcr screen     Status: None   Collection  Time: 02/13/21  8:24 PM   Specimen: Nasal Mucosa; Nasal Swab  Result Value Ref Range Status   MRSA, PCR NEGATIVE NEGATIVE Final   Staphylococcus aureus NEGATIVE NEGATIVE Final    Comment: (NOTE) The Xpert SA Assay (FDA approved for NASAL specimens in patients 47 years of age and older), is one component of a comprehensive surveillance program. It is not intended to diagnose infection nor to guide or monitor treatment. Performed at [facility], [place] [address] [place],  [address]       Radiology Studies: No  results found.    Marzetta Board, MD, PhD [facility]  Between 7 am - 7 pm I am available, please contact me via Amion (for emergencies) or Securechat (non urgent messages)  Between 7 pm - 7 am I am not available, please contact night coverage MD/APP via Amion

## 2021-02-20 NOTE — Progress Notes (Signed)
Palliative Medicine Inpatient Follow Up Note  Consulting Provider: Caren Griffins, MD   Reason for consult:   [facility] Palliative Medicine Consult  Reason for Consult? GOC    HPI:  Per intake H&P --> 88 year old female with history of A. fib on Eliquis, CAD, HTN, dementia, history of lymphoma who came into the hospital and was admitted on 02/13/2021 after a ground-level fall.  She was found on the floor during the morning hours, could not recall what happened.  She was found to have a hip fracture on the right side and orthopedics was consulted, she is status post right hip intertrochanteric fixation with nail on 1/2.  Hospital course complicated by A. fib with RVR with rates into the 150s for which cardiology was consulted.  Due to persistent hypotension with diltiazem as well as amiodarone she was transferred to stepdown on 1/4.   Palliative care has been asked to get involved to discuss goals of care post surgery.   Today's Discussion (02/19/2021):  *Please note that this is a verbal dictation therefore any spelling or grammatical errors are due to the "[place] One" system interpretation.  Chart reviewed inclusive of progress notes, laboratory results, and diagnostic images.   I met with Thayer Headings at bedside. The mobility assistant and RN were present at bedside. We reviewed that she remains very tense when trying to get out of bed and has a hard time following directions.  At the time I saw Noriah she was having a lot of discomfort in the setting of trying to have a bowel movement. On assessment she had stool present in her rectal vault. Reviewed with patients RN to provide a tap water enema as a form of relief.   I spoke to patients daughter, [name] she shares that Dr, Cruzita Lederer has provided a MOST form. She would like to take some time to review this. I provide supplement with the "Hard Choices for Loving People" book. We agreed to meet Tuesday morning to  complete this form.   Concerns regarding nutrition were raised as [facility] continues to have very little PO intake at this time.  Broached the topic of hospice gently. I asked if [name] would be willing to meet with one of the [facility] to gather more information on their services. She is open to this.   Questions and concerns addressed   Palliative Support Provided.   Objective Assessment: Vital Signs Vitals:   02/20/21 0834 02/20/21 1243  BP: 121/85 98/67  Pulse: (!) 120 99  Resp: 20 20  Temp: 97.9 F (36.6 C) 98.8 F (37.1 C)  SpO2: 94% 93%    Intake/Output Summary (Last 24 hours) at 02/20/2021 1544 Last data filed at 02/20/2021 1246 Gross per 24 hour  Intake 413 ml  Output 1075 ml  Net -662 ml    Last Weight  Most recent update: 02/16/2021  3:36 AM    Weight  54.1 kg (119 lb 4.3 oz)            Gen:  Elderly Frail Caucasian F in NAD HEENT: moist mucous membranes CV: Irregular rate and rhythm  PULM: On RA ABD: soft/nontender  EXT: No edema  Neuro: Alert and oriented x1-2  SUMMARY OF RECOMMENDATIONS   DNAR/DNI    Patient family would like to be medically optimized   Plan to transition to [facility] - if not thriving they are open to hospice through [facility]  support through [facility]), daughter Mary Sella is also willing to sit down with liaison to learn more about hospice services   Ongoing PMT support   Code Status/Advance Care Planning: DNAR/DNI   Symptom Management:  Right Hip Pain: - Tylenol 1g PO Q8H  Constipation: - TW enema this afternoon - Miralax BID  MDM - High in the setting of ongoing afib w. RVR, s/p right hip fax repair, dementia. Conversations on escalation versus de-escalation of care.  ______________________________________________________________________________________ [facility] Team Team Cell Phone: [phone] Please utilize secure  chat with additional questions, if there is no response within 30 minutes please call the above phone number  Palliative Medicine Team providers are available by phone from 7am to 7pm daily and can be reached through the team cell phone.  Should this patient require assistance outside of these hours, please call the patient's attending physician.

## 2021-02-20 NOTE — Progress Notes (Signed)
Progress Note  Patient Name: Kathy Massey Date of Encounter: 02/20/2021  [facility] Cardiologist: Will Meredith Leeds, MD   Subjective   Sleeping comfortably in bed. Daughter and son in law at bedside. Reviewed overnight events, had discomfort with her RVR on oral amiodarone, had IV reinserted and started back on amiodarone drip with good results. See summary of discussion below.  Inpatient Medications    Scheduled Meds:  acetaminophen  1,000 mg Oral Q8H   apixaban  2.5 mg Oral BID   atorvastatin  20 mg Oral QHS   Chlorhexidine Gluconate Cloth  6 each Topical Daily   docusate sodium  100 mg Oral BID   escitalopram  5 mg Oral Daily   feeding supplement  237 mL Oral BID BM   furosemide  40 mg Oral Daily   hyaluronidase Human  19.5 Units Subcutaneous UD   mouth rinse  15 mL Mouth Rinse BID   memantine  10 mg Oral BID   midodrine  10 mg Oral TID WC   multivitamin with minerals  1 tablet Oral Daily   polyethylene glycol  17 g Oral BID   QUEtiapine  25 mg Oral QHS   Continuous Infusions:  amiodarone 30 mg/hr (02/20/21 1438)   methocarbamol (ROBAXIN) IV     PRN Meds: bisacodyl, Glycerin (Adult), hydrOXYzine, methocarbamol **OR** methocarbamol (ROBAXIN) IV, morphine injection, ondansetron (ZOFRAN) IV, oxyCODONE   Vital Signs    Vitals:   02/20/21 0645 02/20/21 0834 02/20/21 1243 02/20/21 1715  BP: (!) 123/103 121/85 98/67 97/64   Pulse:  (!) 120 99 92  Resp: 18 20 20 18   Temp:  97.9 F (36.6 C) 98.8 F (37.1 C) 97.8 F (36.6 C)  TempSrc:  Oral Oral Oral  SpO2:  94% 93% 100%  Weight:      Height:        Intake/Output Summary (Last 24 hours) at 02/20/2021 1801 Last data filed at 02/20/2021 1246 Gross per 24 hour  Intake 413 ml  Output 500 ml  Net -87 ml   Last 3 Weights 02/16/2021 02/14/2021 02/13/2021  Weight (lbs) 119 lb 4.3 oz 110 lb 110 lb  Weight (kg) 54.1 kg 49.896 kg 49.896 kg      Telemetry    Atrial fibrillation, improving heart rates - Personally  Reviewed  ECG    No new since 02/13/21 - Personally Reviewed  Physical Exam   GEN: Frail appearing elderly woman in no acute distress NECK: JVD low neck at 45 degrees CARDIAC: improved rate, irregularly irregular rhythm, normal S1 and S2, no rubs or gallops. 2/6 systolic murmur. VASCULAR: Radial pulses 2+ bilaterally.  RESPIRATORY:  largely clear with some residual rhonchi/rales ABDOMEN: Soft, non-tender, non-distended MUSCULOSKELETAL:  Moves all 4 limbs independently SKIN: Warm and dry, no edema NEUROLOGIC:  No focal neuro deficits noted. PSYCHIATRIC:  Normal affect    Labs    High Sensitivity Troponin:  No results for input(s): TROPONINIHS in the last 720 hours.   Chemistry Recent Labs  Lab 02/16/21 0217 02/17/21 0326 02/18/21 0418 02/19/21 0135 02/19/21 1040 02/20/21 0135  NA 137 135 136 137  --  134*  K 3.7 4.2 4.1 3.2*  --  4.4  CL 105 105 103 101  --  100  CO2 25 26 23 26   --  25  GLUCOSE 108* 91 106* 118*  --  96  BUN 21 23 27* 26*  --  24*  CREATININE 1.22* 1.16* 1.22* 1.16*  --  1.13*  CALCIUM  8.2* 8.3* 8.1* 8.2*  --  8.2*  MG 2.1 2.1  --   --  1.9  --   PROT 4.5* 4.7* 4.7* 5.0*  --  4.9*  ALBUMIN 2.4* 2.4* 2.3* 2.3*  --  2.2*  AST 22 20 30 29   --  50*  ALT 7 6 <5 9  --  13  ALKPHOS 86 88 93 119  --  184*  BILITOT 1.5* 1.1 1.4* 1.6*  --  1.4*  GFRNONAA 42* 45* 42* 45*  --  46*  ANIONGAP 7 4* 10 10  --  9    Lipids No results for input(s): CHOL, TRIG, HDL, LABVLDL, LDLCALC, CHOLHDL in the last 168 hours.  Hematology Recent Labs  Lab 02/18/21 0418 02/19/21 0135 02/20/21 0135  WBC 5.2 6.1 6.8  RBC 3.17* 3.11* 3.11*  HGB 9.8* 9.7* 9.6*  HCT 31.3* 29.9* 30.1*  MCV 98.7 96.1 96.8  MCH 30.9 31.2 30.9  MCHC 31.3 32.4 31.9  RDW 15.9* 16.0* 15.9*  PLT 158 195 201   Thyroid No results for input(s): TSH, FREET4 in the last 168 hours.  BNP Recent Labs  Lab 02/15/21 1554  BNP 227.9*    DDimer No results for input(s): DDIMER in the last 168 hours.    Radiology    No results found.  Cardiac Studies   Echo 02/16/21  1. Left ventricular ejection fraction, by estimation, is 30 to 35%. The  left ventricle has moderately decreased function. The left ventricle  demonstrates global hypokinesis. Diastolic function indeterminant due to  AFib.   2. Right ventricular systolic function is normal. The right ventricular  size is mildly enlarged. There is mildly elevated pulmonary artery  systolic pressure. The estimated right ventricular systolic pressure is  19.6 mmHg.   3. Left atrial size was severely dilated.   4. Right atrial size was severely dilated.   5. The mitral valve is grossly normal. Moderate mitral valve  regurgitation.   6. Tricuspid valve regurgitation is severe.   7. The aortic valve is tricuspid. There is mild calcification of the  aortic valve. There is mild thickening of the aortic valve. Aortic valve  regurgitation is mild. Aortic valve sclerosis/calcification is present,  without any evidence of aortic  stenosis.   8. The inferior vena cava is dilated in size with <50% respiratory  variability, suggesting right atrial pressure of 15 mmHg.   ECHO: 06/16/2015  Normal left ventricular systolic function, ejection fraction > 22%   Diastolic dysfunction - grade I (normal filling pressures)   Dilated left atrium - mild   Degenerative mitral valve disease   Tricuspid regurgitation - mild   Pulmonic regurgitation - mild   Normal right ventricular systolic function   Dilated right atrium - mild  CATH 06/17/2015 Findings:  1. No angiographically apparent coronary artery disease.  2. Normal left ventricular filling pressures (LVEDP = 9 mm Hg).  3. Borderline left ventricular contraction with apical akinesis,  suggestive of stress-induced cardiomyopathy.   Recommendations:  1. Cardiovascular risk reduction.  2. Follow up with primary cardiologist.   Patient Profile     88 y.o. female with PMH permanent atrial  fibrillation on apixaban, history of stress induced cardiomyopathy in 2017 with normal cors and EF >55%, non-Hodgkins lymphoma who presented with right hip fracture. Cardiology following for management of atrial fibrillation with RVR. Found to have reduced EF this admission.  Assessment & Plan    Acute systolic and diastolic heart failure Cardiomyopathy Hypotension -likely nonischemic,  given normal cors on cath in 2017 (records in [place]) -EF 30-35% on echo this admission, with severe biatrial enlargement, moderate MR, severe TR, RVSP 44 mmHg, IVC dilated, RAP 15 mmHg -on midodrine for hypotension and still with intermittent lows. No BP room for guideline directed medical therapy for cardiomyopathy -see extensive discussion 02/18/21. Tenuous balance between blood pressure, volume status, and heart rate. Aiming for conservative oral management that will allow her to go to rehab facility and recover from her hip surgery -family understands that it is unlikely that we will be able to optimize her heart rate, blood pressure, and volume status. We will aim for the balance that makes her feel the best. -tolerating oral furosemide, slight improvement in volume status without severe drop in blood pressure. Continue. Did not tolerate IV lasix   Atrial fibrillation with RVR -no blood pressure room for other agents, attempting rate control with IV amiodarone. -on apixaban 2.5 mg BID (reduced for age, weight) -she did not have IV access for a time 1/7 and we attempted oral amiodarone. This did not control her rates, and she felt very poorly. She was restarted on IV amiodarone, and her rates have actually been the best controlled this entire admission. We may be getting enough of a load that we could try oral again in the near future. -I am hesitant about digoxin. With her baseline mental status and mild renal disease, I am not sure that we would catch toxicity with her in a timely manner. But, if she is  not rate controlled with oral amio, there may not be another option.  Overall we had a long discussion about options for management. She is already DNR. Family would like to try to get her to rehab to get stronger, but they also are willing to discuss hospice if she doesn't make the progress needed to rehab from surgery.  Time Spent with Patient: I have spent a total of 60 minutes  with patient reviewing hospital notes, telemetry, EKGs, labs and examining the patient as well as establishing an assessment and plan that was discussed with the patient.  > 50% of time was spent in direct patient care.   For questions or updates, please contact [place] Please consult [email or web address] for contact info under        Signed, Buford Dresser, MD  02/20/2021, 6:01 PM

## 2021-02-21 ENCOUNTER — Inpatient Hospital Stay (HOSPITAL_COMMUNITY): Payer: Medicare PPO

## 2021-02-21 DIAGNOSIS — I4891 Unspecified atrial fibrillation: Secondary | ICD-10-CM | POA: Diagnosis not present

## 2021-02-21 DIAGNOSIS — I959 Hypotension, unspecified: Secondary | ICD-10-CM | POA: Diagnosis not present

## 2021-02-21 DIAGNOSIS — I5020 Unspecified systolic (congestive) heart failure: Secondary | ICD-10-CM | POA: Diagnosis not present

## 2021-02-21 DIAGNOSIS — S72141A Displaced intertrochanteric fracture of right femur, initial encounter for closed fracture: Secondary | ICD-10-CM | POA: Diagnosis not present

## 2021-02-21 DIAGNOSIS — I509 Heart failure, unspecified: Secondary | ICD-10-CM

## 2021-02-21 DIAGNOSIS — I428 Other cardiomyopathies: Secondary | ICD-10-CM | POA: Diagnosis not present

## 2021-02-21 DIAGNOSIS — I5041 Acute combined systolic (congestive) and diastolic (congestive) heart failure: Secondary | ICD-10-CM | POA: Diagnosis not present

## 2021-02-21 LAB — BASIC METABOLIC PANEL
Anion gap: 9 (ref 5–15)
BUN: 22 mg/dL (ref 8–23)
CO2: 25 mmol/L (ref 22–32)
Calcium: 8.5 mg/dL — ABNORMAL LOW (ref 8.9–10.3)
Chloride: 100 mmol/L (ref 98–111)
Creatinine, Ser: 1.06 mg/dL — ABNORMAL HIGH (ref 0.44–1.00)
GFR, Estimated: 50 mL/min — ABNORMAL LOW (ref >60)
Glucose, Bld: 87 mg/dL (ref 70–99)
Potassium: 4.5 mmol/L (ref 3.5–5.1)
Sodium: 134 mmol/L — ABNORMAL LOW (ref 135–145)

## 2021-02-21 LAB — TSH: TSH: 3.267 u[IU]/mL (ref 0.350–4.500)

## 2021-02-21 LAB — BRAIN NATRIURETIC PEPTIDE: B Natriuretic Peptide: 474.1 pg/mL — ABNORMAL HIGH (ref 0.0–100.0)

## 2021-02-21 NOTE — Progress Notes (Signed)
PROGRESS NOTE  Kathy Massey QIH:[id number] DOB: 1927-09-21 DOA: 02/13/2021 PCP: [name], MD   LOS: 8 days   Brief Narrative / Interim history: 88 year old female with history of A. fib on Eliquis, CAD, HTN, dementia, history of lymphoma who came into the hospital and was admitted on 02/13/2021 after a ground-level fall.  She was found on the floor during the morning hours, could not recall what happened.  She was found to have a hip fracture on the right side and orthopedics was consulted, she is status post right hip intertrochanteric fixation with nail on 1/2.  Hospital course complicated by A. fib with RVR with rates into the 150s for which cardiology was consulted.  Due to persistent hypotension with diltiazem as well as amiodarone she was transferred to stepdown on 1/4.  Subjective / 24h Interval events: Remains confused, anxious this morning  Assessment & Plan: Principal Problem Persistent A. fib with RVR, hypotension,- Main issue currently, appreciate cardiology consultation.  Her hypotension complicates the picture, could not use diltiazem.  She has been started on midodrine, continue -Poorly rate controlled, continue amiodarone per cardiology, currently on IV drip -2D echo done shows EF 30-35%, global hypokinesis of the LV.  Query tachycardia induced.  Appreciate cardiology follow-up  Active Problems Right hip fracture-due to mechanical fall, orthopedic surgery consulted and she is status post nail intertrochanteric hip fixation.  Per Ortho, weightbearing as tolerated.  Needs SNF once acute issues improve and once cleared by cardiology.  Anticoagulated with Eliquis  Hyperglycemia-no known diabetes.  Likely transient in the setting of acute stress.  Continue to monitor CBGs with morning labs.  CBG acceptable this morning on the BMP  Hypokalemia-replete and monitor  Chronic kidney disease stage IIIa-Baseline creatinine 1.1-1.2, currently at baseline.  Getting Lasix  today  Acute on chronic systolic and diastolic CHF-repeat 2D echo shows EF 30-35%, moderately decreased LV function.  LV has global hypokinesis.  Management per cardiology, on oral Lasix  Underlying dementia-mixed Alzheimer and vascular.  Monitor  Goals of care-she is DNR.  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 She was treated with acalabrutinib for recurrent disease, stopped due to toxicity. No obvious evidence of recurrence, followed by Dr. Marin Olp   Acute postoperative blood loss anemia superimposed on normocytic anemia/anemia of chronic disease -monitor, hemoglobin remained stable  Thrombocytopenia -Likely consumptive, platelets overall stable and now platelets have normalized, stable today  Scheduled Meds:  acetaminophen  1,000 mg Oral Q8H   apixaban  2.5 mg Oral BID   atorvastatin  20 mg Oral QHS   Chlorhexidine Gluconate Cloth  6 each Topical Daily   docusate sodium  100 mg Oral BID   escitalopram  5 mg Oral Daily   feeding supplement  237 mL Oral BID BM   furosemide  40 mg Oral Daily   mouth rinse  15 mL Mouth Rinse BID   memantine  10 mg Oral BID   midodrine  10 mg Oral TID WC   multivitamin with minerals  1 tablet Oral Daily   polyethylene glycol  17 g Oral BID   QUEtiapine  25 mg Oral QHS   Continuous Infusions:  amiodarone 30 mg/hr (02/21/21 0600)   methocarbamol (ROBAXIN) IV     PRN Meds:.bisacodyl, Glycerin (Adult), hydrOXYzine, methocarbamol **OR** methocarbamol (ROBAXIN) IV, morphine injection, ondansetron (ZOFRAN) IV, oxyCODONE  Diet Orders (From  admission, onward)     Start     Ordered   02/18/21 1521  Diet regular Room service appropriate? Yes; Fluid consistency: Thin; Fluid restriction: 2000 mL Fluid  Diet effective now       Question Answer Comment   Room service appropriate? Yes   Fluid consistency: Thin   Fluid restriction: 2000 mL Fluid      02/18/21 1521            DVT prophylaxis: apixaban (ELIQUIS) tablet 2.5 mg Start: 02/15/21 1115 SCDs Start: 02/13/21 1219 apixaban (ELIQUIS) tablet 2.5 mg     Code Status: DNR  Family Communication: No family at bedside, discussed with daughter last night  Status is: Inpatient  Remains inpatient appropriate because: A. fib with RVR, hypotension  Level of care: Progressive  Consultants:  Cardiology  Orthopedic surgery   Procedures:  2D echo  Microbiology  none  Antimicrobials: none    Objective: Vitals:   02/21/21 0400 02/21/21 0500 02/21/21 0600 02/21/21 0700  BP: (!) 87/61 93/68 (!) 86/71 92/67  Pulse: 76 93 82 100  Resp: (!) 33 17 (!) 23 20  Temp: (!) 97.5 F (36.4 C)   98.2 F (36.8 C)  TempSrc: Oral   Oral  SpO2: 96% 94% 93% 94%  Weight:      Height:        Intake/Output Summary (Last 24 hours) at 02/21/2021 1044 Last data filed at 02/21/2021 0600 Gross per 24 hour  Intake 381.28 ml  Output 375 ml  Net 6.28 ml    Filed Weights   02/13/21 0855 02/14/21 0911 02/16/21 0336  Weight: 49.9 kg 49.9 kg 54.1 kg    Examination:  Constitutional: No distress Eyes: Anicteric ENMT: Moist mucous membranes Neck: normal, supple Respiratory: Crackles at the bases, no wheezing Cardiovascular: Irregular, tachycardic Abdomen: Soft, NT, ND, bowel sounds positive Musculoskeletal: no clubbing / cyanosis.  Skin: No rashes seen Neurologic: Nonfocal   Data Reviewed: I have independently reviewed following labs and imaging studies   CBC: Recent Labs  Lab 02/15/21 0352 02/16/21 0217 02/17/21 0326 02/18/21 0418 02/19/21 0135 02/20/21 0135  WBC 5.7 4.6 5.1 5.2 6.1 6.8  NEUTROABS 4.5 3.6  --   --   --   --   HGB 10.0* 9.4* 9.4* 9.8* 9.7* 9.6*  HCT 30.8* 29.5* 30.3* 31.3* 29.9* 30.1*  MCV 99.7 99.3 101.0* 98.7 96.1 96.8  PLT 112* 116* 140* 158 195 201     Basic Metabolic Panel: Recent Labs  Lab 02/15/21 0352 02/16/21 0217 02/17/21 0326 02/18/21 0418 02/19/21 0135 02/19/21 1040 02/20/21 0135 02/21/21 0149  NA 138 137 135 136 137  --  134* 134*  K 3.8 3.7 4.2 4.1 3.2*  --  4.4 4.5  CL 106 105 105 103 101  --  100 100  CO2 24 25 26 23 26   --  25 25  GLUCOSE 95 108* 91 106* 118*  --  96 87  BUN 16 21 23  27* 26*  --  24* 22  CREATININE 1.22* 1.22* 1.16* 1.22* 1.16*  --  1.13* 1.06*  CALCIUM 8.0* 8.2* 8.3* 8.1* 8.2*  --  8.2* 8.5*  MG 2.0 2.1 2.1  --   --  1.9  --   --   PHOS 3.6 3.8 3.5  --   --   --   --   --     Liver Function Tests: Recent Labs  Lab 02/16/21 0217 02/17/21 0326 02/18/21 0418 02/19/21 0135 02/20/21 0135  AST 22 20 30 29  50*  ALT 7 6 5 9 13   ALKPHOS 86 88 93 119 184*  BILITOT 1.5* 1.1 1.4* 1.6* 1.4*  PROT 4.5* 4.7* 4.7* 5.0* 4.9*  ALBUMIN 2.4* 2.4* 2.3* 2.3* 2.2*    Coagulation Profile: No results for input(s): INR, PROTIME in the last 168 hours. HbA1C: No results for input(s): HGBA1C in the last 72 hours. CBG: No results for input(s): GLUCAP in the last 168 hours.  Recent Results (from the past 240 hour(s))  Resp Panel by RT-PCR (Flu A&B, Covid) Nasopharyngeal Swab     Status: None   Collection Time: 02/13/21 11:44 AM   Specimen: Nasopharyngeal Swab; Nasopharyngeal(NP) swabs in vial transport medium  Result Value Ref Range Status   SARS Coronavirus 2 by RT PCR NEGATIVE NEGATIVE Final    Comment: (NOTE) SARS-CoV-2 target nucleic acids are NOT DETECTED.  The SARS-CoV-2 RNA is generally detectable in upper respiratory specimens during the acute phase of infection. The lowest concentration of SARS-CoV-2 viral copies this assay can detect is 138 copies/mL. A negative result does not preclude SARS-Cov-2 infection and should not be used as the sole basis for treatment or other patient management decisions. A negative result may occur with  improper specimen collection/handling, submission of  specimen other than nasopharyngeal swab, presence of viral mutation(s) within the areas targeted by this assay, and inadequate number of viral copies(<138 copies/mL). A negative result must be combined with clinical observations, patient history, and epidemiological information. The expected result is Negative.  Fact Sheet for Patients:  EntrepreneurPulse.com.au  Fact Sheet for Healthcare Providers:  IncredibleEmployment.be  This test is no t yet approved or cleared by the [place] FDA and  has been authorized for detection and/or diagnosis of SARS-CoV-2 by FDA under an Emergency Use Authorization (EUA). This EUA will remain  in effect (meaning this test can be used) for the duration of the COVID-19 declaration under Section 564(b)(1) of the Act, 21 U.S.C.section 360bbb-3(b)(1), unless the authorization is terminated  or revoked sooner.       Influenza A by PCR NEGATIVE NEGATIVE Final   Influenza B by PCR NEGATIVE NEGATIVE Final    Comment: (NOTE) The Xpert Xpress SARS-CoV-2/FLU/RSV plus assay is intended as an aid in the diagnosis of influenza from Nasopharyngeal swab specimens and should not be used as a sole basis for treatment. Nasal washings and aspirates are unacceptable for Xpert Xpress SARS-CoV-2/FLU/RSV testing.  Fact Sheet for Patients: EntrepreneurPulse.com.au  Fact Sheet for Healthcare Providers: IncredibleEmployment.be  This test is not yet approved or cleared by the [place] FDA and has been authorized for detection and/or diagnosis of SARS-CoV-2 by FDA under an Emergency Use Authorization (EUA). This EUA will remain in effect (meaning this test can be used) for the duration of the COVID-19 declaration under Section 564(b)(1) of the Act, 21 U.S.C. section 360bbb-3(b)(1), unless the authorization is terminated or revoked.  Performed at [facility], [place] [address],  [place], [place] [address]   Surgical pcr screen     Status: None   Collection Time: 02/13/21  8:24 PM   Specimen: Nasal Mucosa; Nasal Swab  Result Value Ref Range Status   MRSA, PCR NEGATIVE NEGATIVE Final   Staphylococcus aureus NEGATIVE NEGATIVE Final    Comment: (NOTE) The Xpert SA Assay (FDA approved for NASAL specimens in patients 53 years of age and older), is one component of a comprehensive surveillance program. It is not intended to diagnose infection nor to guide or monitor  treatment. Performed at [facility], [place] [address], [place], [place] [address]       Radiology Studies: DG CHEST PORT 1 VIEW  Result Date: 02/21/2021 CLINICAL DATA:  88 year old female with history of congestive heart failure. Confusion. EXAM: PORTABLE CHEST 1 VIEW COMPARISON:  Chest x-ray 02/16/2021. FINDINGS: Lung volumes are normal. No consolidative airspace disease. No pleural effusions. No pneumothorax. No evidence of pulmonary edema. Moderate cardiomegaly. Upper mediastinal contours are within normal limits. Atherosclerotic calcifications in the thoracic aorta. IMPRESSION: 1. No radiographic evidence of acute cardiopulmonary disease. 2. Moderate cardiomegaly. 3. Aortic atherosclerosis. Electronically Signed   By: Vinnie Langton M.D.   On: 02/21/2021 09:07      Marzetta Board, MD, PhD [facility]  Between 7 am - 7 pm I am available, please contact me via Amion (for emergencies) or Securechat (non urgent messages)  Between 7 pm - 7 am I am not available, please contact night coverage MD/APP via Amion

## 2021-02-21 NOTE — Progress Notes (Signed)
Nutrition Follow-up  DOCUMENTATION CODES:   Severe malnutrition in context of chronic illness  INTERVENTION:   Vital Cuisine Shake TID with meals, each supplement provides 520 kcal and 22 grams of protein.  Continue Ensure Enlive po BID, each supplement provides 350 kcal and 20 grams of protein.  Continue MVI with minerals daily.  NUTRITION DIAGNOSIS:   Severe Malnutrition related to chronic illness (dementia, CAD) as evidenced by severe muscle depletion, severe fat depletion.  Ongoing   GOAL:   Patient will meet greater than or equal to 90% of their needs  Unmet  MONITOR:   Supplement acceptance, PO intake, Labs, Weight trends  REASON FOR ASSESSMENT:   Consult Hip fracture protocol  ASSESSMENT:   Pt admitted from [facility] with R hip fracture after a fall. PMH includes afib, CAD, HTN, dementia and h/o of lymphoma.  Spoke with patient's daughter and son at bedside. They report that patient essentially ate nothing yesterday. She likes the Magic cup supplements, which are currently unavailable. RD to change meal supplements to Vital Cuisine shakes. Daughter is agreeable. Patient is being offered Ensure Enlive PO BID. She is drinking a few sips when daughter is present, but not much. Patient is constipated (LBM PTA) and daughter believes this is affecting appetite and PO intake. Enema was given yesterday.  Palliative care team is following. Patient is DNAR/DNI. Plans for placement at [facility], then transition to hospice care if she does not thrive.   Labs reviewed. Na 134  Medications reviewed and include Colace, Lasix, MVI with minerals, Miralax. Patient would not swallow Colace capsule this morning.   NUTRITION - FOCUSED PHYSICAL EXAM:  Flowsheet Row Most Recent Value  Orbital Region Severe depletion  Upper Arm Region Mild depletion  Thoracic and Lumbar Region Severe depletion  Buccal Region Severe depletion  Temple Region Severe depletion  Clavicle  Bone Region Severe depletion  Clavicle and Acromion Bone Region Severe depletion  Scapular Bone Region Severe depletion  Dorsal Hand Severe depletion  Patellar Region Moderate depletion  Anterior Thigh Region Moderate depletion  Posterior Calf Region Moderate depletion  Edema (RD Assessment) Mild  Hair Reviewed  Eyes Reviewed  Mouth Reviewed  Skin Reviewed  Nails Reviewed       Diet Order:   Diet Order             Diet regular Room service appropriate? Yes; Fluid consistency: Thin; Fluid restriction: 2000 mL Fluid  Diet effective now                   EDUCATION NEEDS:   No education needs have been identified at this time  Skin:  Skin Assessment: Reviewed RN Assessment  Last BM:  PTA  Height:   Ht Readings from Last 1 Encounters:  02/16/21 4\' 11"  (1.499 m)    Weight:   Wt Readings from Last 1 Encounters:  02/16/21 54.1 kg    BMI:  Body mass index is 24.09 kg/m.  Estimated Nutritional Needs:   Kcal:  1500-1700  Protein:  75-85g  Fluid:  >/=1.5L    Lucas Mallow RD, LDN, CNSC Please refer to Amion for contact information.

## 2021-02-21 NOTE — Progress Notes (Signed)
Mobility Specialist: Progress Note   02/21/21 1828  Mobility  Activity Transferred to/from [facility]  Level of Assistance +2 (takes two people)  Information systems manager Ambulated (ft) 2 ft  Mobility Out of bed for toileting  Mobility Response Tolerated poorly  Mobility performed by Mobility specialist  $Mobility charge 1 Mobility   Pt assisted to [facility] per RN request. Pt with significant hip flexion and unable to fully stand. BM successful and pt back to bed after. Recommend using Stedy next session.  [facility] Malakie Balis Mobility Specialist Mobility Specialist [address] [place]: [phone] Mobility Specialist [address] [place]: [phone]

## 2021-02-21 NOTE — Progress Notes (Signed)
Progress Note  Patient Name: Kathy Massey Date of Encounter: 02/21/2021  [facility] Cardiologist: Will Meredith Leeds, MD   Subjective   Seems confused with right leg pain   Inpatient Medications    Scheduled Meds:  acetaminophen  1,000 mg Oral Q8H   apixaban  2.5 mg Oral BID   atorvastatin  20 mg Oral QHS   Chlorhexidine Gluconate Cloth  6 each Topical Daily   docusate sodium  100 mg Oral BID   escitalopram  5 mg Oral Daily   feeding supplement  237 mL Oral BID BM   furosemide  40 mg Oral Daily   hyaluronidase Human  19.5 Units Subcutaneous UD   mouth rinse  15 mL Mouth Rinse BID   memantine  10 mg Oral BID   midodrine  10 mg Oral TID WC   multivitamin with minerals  1 tablet Oral Daily   polyethylene glycol  17 g Oral BID   QUEtiapine  25 mg Oral QHS   Continuous Infusions:  amiodarone 30 mg/hr (02/21/21 0600)   methocarbamol (ROBAXIN) IV     PRN Meds: bisacodyl, Glycerin (Adult), hydrOXYzine, methocarbamol **OR** methocarbamol (ROBAXIN) IV, morphine injection, ondansetron (ZOFRAN) IV, oxyCODONE   Vital Signs    Vitals:   02/21/21 0400 02/21/21 0500 02/21/21 0600 02/21/21 0700  BP: (!) 87/61 93/68 (!) 86/71 92/67  Pulse: 76 93 82 100  Resp: (!) 33 17 (!) 23 20  Temp: (!) 97.5 F (36.4 C)   98.2 F (36.8 C)  TempSrc: Oral   Oral  SpO2: 96% 94% 93% 94%  Weight:      Height:        Intake/Output Summary (Last 24 hours) at 02/21/2021 0839 Last data filed at 02/21/2021 0600 Gross per 24 hour  Intake 381.28 ml  Output 375 ml  Net 6.28 ml   Last 3 Weights 02/16/2021 02/14/2021 02/13/2021  Weight (lbs) 119 lb 4.3 oz 110 lb 110 lb  Weight (kg) 54.1 kg 49.896 kg 49.896 kg      Telemetry    Atrial fibrillation, improving heart rates - Personally Reviewed  ECG    No new since 02/13/21 - Personally Reviewed  Physical Exam   Frail elderly female  JVP elevated   Labs    High Sensitivity Troponin:  No results for input(s): TROPONINIHS in the last 720  hours.   Chemistry Recent Labs  Lab 02/16/21 0217 02/17/21 0326 02/18/21 0418 02/19/21 0135 02/19/21 1040 02/20/21 0135 02/21/21 0149  NA 137 135 136 137  --  134* 134*  K 3.7 4.2 4.1 3.2*  --  4.4 4.5  CL 105 105 103 101  --  100 100  CO2 25 26 23 26   --  25 25  GLUCOSE 108* 91 106* 118*  --  96 87  BUN 21 23 27* 26*  --  24* 22  CREATININE 1.22* 1.16* 1.22* 1.16*  --  1.13* 1.06*  CALCIUM 8.2* 8.3* 8.1* 8.2*  --  8.2* 8.5*  MG 2.1 2.1  --   --  1.9  --   --   PROT 4.5* 4.7* 4.7* 5.0*  --  4.9*  --   ALBUMIN 2.4* 2.4* 2.3* 2.3*  --  2.2*  --   AST 22 20 30 29   --  50*  --   ALT 7 6 <5 9  --  13  --   ALKPHOS 86 88 93 119  --  184*  --   BILITOT  1.5* 1.1 1.4* 1.6*  --  1.4*  --   GFRNONAA 42* 45* 42* 45*  --  46* 50*  ANIONGAP 7 4* 10 10  --  9 9    Lipids No results for input(s): CHOL, TRIG, HDL, LABVLDL, LDLCALC, CHOLHDL in the last 168 hours.  Hematology Recent Labs  Lab 02/18/21 0418 02/19/21 0135 02/20/21 0135  WBC 5.2 6.1 6.8  RBC 3.17* 3.11* 3.11*  HGB 9.8* 9.7* 9.6*  HCT 31.3* 29.9* 30.1*  MCV 98.7 96.1 96.8  MCH 30.9 31.2 30.9  MCHC 31.3 32.4 31.9  RDW 15.9* 16.0* 15.9*  PLT 158 195 201   Thyroid No results for input(s): TSH, FREET4 in the last 168 hours.  BNP Recent Labs  Lab 02/15/21 1554  BNP 227.9*    DDimer No results for input(s): DDIMER in the last 168 hours.   Radiology    No results found.  Cardiac Studies   Echo 02/16/21  1. Left ventricular ejection fraction, by estimation, is 30 to 35%. The  left ventricle has moderately decreased function. The left ventricle  demonstrates global hypokinesis. Diastolic function indeterminant due to  AFib.   2. Right ventricular systolic function is normal. The right ventricular  size is mildly enlarged. There is mildly elevated pulmonary artery  systolic pressure. The estimated right ventricular systolic pressure is  74.1 mmHg.   3. Left atrial size was severely dilated.   4. Right atrial size  was severely dilated.   5. The mitral valve is grossly normal. Moderate mitral valve  regurgitation.   6. Tricuspid valve regurgitation is severe.   7. The aortic valve is tricuspid. There is mild calcification of the  aortic valve. There is mild thickening of the aortic valve. Aortic valve  regurgitation is mild. Aortic valve sclerosis/calcification is present,  without any evidence of aortic  stenosis.   8. The inferior vena cava is dilated in size with <50% respiratory  variability, suggesting right atrial pressure of 15 mmHg.   ECHO: 06/16/2015  Normal left ventricular systolic function, ejection fraction > 28%   Diastolic dysfunction - grade I (normal filling pressures)   Dilated left atrium - mild   Degenerative mitral valve disease   Tricuspid regurgitation - mild   Pulmonic regurgitation - mild   Normal right ventricular systolic function   Dilated right atrium - mild  CATH 06/17/2015 Findings:  1. No angiographically apparent coronary artery disease.  2. Normal left ventricular filling pressures (LVEDP = 9 mm Hg).  3. Borderline left ventricular contraction with apical akinesis,  suggestive of stress-induced cardiomyopathy.   Recommendations:  1. Cardiovascular risk reduction.  2. Follow up with primary cardiologist.   Patient Profile     88 y.o. female with PMH permanent atrial fibrillation on apixaban, history of stress induced cardiomyopathy in 2017 with normal cors and EF >55%, non-Hodgkins lymphoma who presented with right hip fracture. Cardiology following for management of atrial fibrillation with RVR. Found to have reduced EF this admission.  Assessment & Plan    Acute systolic and diastolic heart failure: EF 30-35% by TTE 02/17/20 GDMT limited by low BP and afib. She has wheezing and rhonchi this am Check BNP/CXR Lasix 40 mg this am  Hypotension:  post hip fracture and in bed. Continue midodrine Random cortisol normal 02/17/21 Hct 30.1 stable  Check TSH     Atrial fibrillation with RVR:  BP too low for calcium blocker/beta blocker on iv amiodarone. Renally/age dosed Eliquis. Risk for digoxin toxicity also  not ideal.   Dementia:  On Seroquel and namenda. DNR trying to get stable enough for inpatient rehab post surgery 02/14/21 for right hip fracture      Signed, Jenkins Rouge, MD  02/21/2021, 8:39 AM

## 2021-02-21 NOTE — Care Management Important Message (Signed)
Important Message  Patient Details  Name: Kathy Massey MRN: [id number] Date of Birth: [birth year]   Medicare Important Message Given:  Yes     Shelda Altes 02/21/2021, 9:38 AM

## 2021-02-22 DIAGNOSIS — Z515 Encounter for palliative care: Secondary | ICD-10-CM | POA: Diagnosis not present

## 2021-02-22 DIAGNOSIS — Z7189 Other specified counseling: Secondary | ICD-10-CM | POA: Diagnosis not present

## 2021-02-22 DIAGNOSIS — S72141A Displaced intertrochanteric fracture of right femur, initial encounter for closed fracture: Secondary | ICD-10-CM | POA: Diagnosis not present

## 2021-02-22 DIAGNOSIS — I428 Other cardiomyopathies: Secondary | ICD-10-CM | POA: Diagnosis not present

## 2021-02-22 DIAGNOSIS — I959 Hypotension, unspecified: Secondary | ICD-10-CM | POA: Diagnosis not present

## 2021-02-22 DIAGNOSIS — E43 Unspecified severe protein-calorie malnutrition: Secondary | ICD-10-CM | POA: Insufficient documentation

## 2021-02-22 DIAGNOSIS — I4819 Other persistent atrial fibrillation: Secondary | ICD-10-CM

## 2021-02-22 DIAGNOSIS — I5041 Acute combined systolic (congestive) and diastolic (congestive) heart failure: Secondary | ICD-10-CM | POA: Diagnosis not present

## 2021-02-22 DIAGNOSIS — Z66 Do not resuscitate: Secondary | ICD-10-CM | POA: Diagnosis not present

## 2021-02-22 LAB — COMPREHENSIVE METABOLIC PANEL
ALT: 12 U/L (ref 0–44)
AST: 36 U/L (ref 15–41)
Albumin: 2.2 g/dL — ABNORMAL LOW (ref 3.5–5.0)
Alkaline Phosphatase: 226 U/L — ABNORMAL HIGH (ref 38–126)
Anion gap: 11 (ref 5–15)
BUN: 22 mg/dL (ref 8–23)
CO2: 25 mmol/L (ref 22–32)
Calcium: 8 mg/dL — ABNORMAL LOW (ref 8.9–10.3)
Chloride: 99 mmol/L (ref 98–111)
Creatinine, Ser: 1.26 mg/dL — ABNORMAL HIGH (ref 0.44–1.00)
GFR, Estimated: 40 mL/min — ABNORMAL LOW (ref >60)
Glucose, Bld: 177 mg/dL — ABNORMAL HIGH (ref 70–99)
Potassium: 3.8 mmol/L (ref 3.5–5.1)
Sodium: 135 mmol/L (ref 135–145)
Total Bilirubin: 1.1 mg/dL (ref 0.3–1.2)
Total Protein: 4.7 g/dL — ABNORMAL LOW (ref 6.5–8.1)

## 2021-02-22 LAB — CBC
HCT: 29.3 % — ABNORMAL LOW (ref 36.0–46.0)
Hemoglobin: 9.3 g/dL — ABNORMAL LOW (ref 12.0–15.0)
MCH: 30.6 pg (ref 26.0–34.0)
MCHC: 31.7 g/dL (ref 30.0–36.0)
MCV: 96.4 fL (ref 80.0–100.0)
Platelets: 269 10*3/uL (ref 150–400)
RBC: 3.04 MIL/uL — ABNORMAL LOW (ref 3.87–5.11)
RDW: 16.3 % — ABNORMAL HIGH (ref 11.5–15.5)
WBC: 7.8 10*3/uL (ref 4.0–10.5)
nRBC: 0 % (ref 0.0–0.2)

## 2021-02-22 LAB — SARS CORONAVIRUS 2 (TAT 6-24 HRS): SARS Coronavirus 2: NEGATIVE

## 2021-02-22 MED ORDER — BUSPIRONE HCL 5 MG PO TABS
5.0000 mg | ORAL_TABLET | Freq: Two times a day (BID) | ORAL | Status: DC
  Administered 2021-02-22 – 2021-02-24 (×6): 5 mg via ORAL
  Filled 2021-02-22 (×6): qty 1

## 2021-02-22 MED ORDER — AMIODARONE HCL 200 MG PO TABS
200.0000 mg | ORAL_TABLET | Freq: Every day | ORAL | Status: DC
  Administered 2021-02-22 – 2021-02-24 (×3): 200 mg via ORAL
  Filled 2021-02-22 (×3): qty 1

## 2021-02-22 NOTE — Progress Notes (Signed)
° ° °  I have reached out to the daughter [name] and discussed Palliative Care services back at the facility when her mother returns. She does verbalize this is her goals at this time.  She is very interested in these services.  We did also discuss hospice care at the facility in event the pt is unable to participate or does not cooperate PT/OT services. We discussed the interdisciplinary team approach and she does feel that her mother would benefit from the additional support at the facility and when the time is right she would be interested in these services as well.   The daughter reports to me that she feels that she needs to give her mother the opportunity to the rehab and see if she is able to improve functionally. She understands that with her mothers inability to always follow direction due to her alz/ dementia that this may be difficult or that she may not tolerate the therapy.   She states that she will meet with Palliative care today at 1030am. I let her know we are willing to help her and her mother as best as we can once a decision has been made on the best course for her mother.   Webb Silversmith RN [phone]

## 2021-02-22 NOTE — Progress Notes (Signed)
Palliative Medicine Inpatient Follow Up Note  Consulting Provider: Caren Griffins, MD   Reason for consult:   [name] [facility] Consult  Reason for Consult? GOC    HPI:  Per intake H&P --> 88 year old female with history of A. fib on Eliquis, CAD, HTN, dementia, history of lymphoma who came into the hospital and was admitted on 02/13/2021 after a ground-level fall.  She was found on the floor during the morning hours, could not recall what happened.  She was found to have a hip fracture on the right side and orthopedics was consulted, she is status post right hip intertrochanteric fixation with nail on 1/2.  Hospital course complicated by A. fib with RVR with rates into the 150s for which cardiology was consulted.  Due to persistent hypotension with diltiazem as well as amiodarone she was transferred to stepdown on 1/4.   Palliative care has been asked to get involved to discuss goals of care post surgery.   Today's Discussion (02/22/2021):  *Please note that this is a verbal dictation therefore any spelling or grammatical errors are due to the "[employer]" system interpretation.  Chart reviewed inclusive of progress notes, laboratory results, and diagnostic images.   I met with Kathy Massey and her brother at bedside. We reviewed Kathy Massey's clinical state of incrementally more confused and anxious. Discussed that this is likely occurring due to delirium in the setting of post operative course as well as prolonged (nine day) hospital stay. Discussed that ideally overtime some of her anxieties and hallucinations should improve.  Discussed starting some low dose Buspar to help manage anxiety episodes and ideally provide more stability.   Discussed MOST form as below:  Cardiopulmonary Resuscitation: Do Not Attempt Resuscitation (DNR/No CPR)  Medical Interventions: Limited Additional Interventions: Use medical treatment, IV fluids and cardiac monitoring as  indicated, DO NOT USE intubation or mechanical ventilation. May consider use of less invasive airway support such as BiPAP or CPAP. Also provide comfort measures. Transfer to the hospital if indicated. Avoid intensive care.   Antibiotics: Determine use of limitation of antibiotics when infection occurs  IV Fluids: IV fluids for a defined trial period  Feeding Tube: Feeding tube for a defined trial period   Reviewed the plan for transition to [facility] SNF with the hope of improvement. If over the course of the next few weeks though she declines then I shared it really would then be time to consider hospice.  [name] spoke to [facility] to provide additional information. Pln to have OP Palliative support through them.   Questions and concerns addressed   Palliative Support Provided.   Objective Assessment: Vital Signs Vitals:   02/22/21 0724 02/22/21 1131  BP: 120/74 93/65  Pulse: 89 (!) 108  Resp: 20 20  Temp: 97.6 F (36.4 C) 98 F (36.7 C)  SpO2: 100% 94%    Intake/Output Summary (Last 24 hours) at 02/22/2021 1333 Last data filed at 02/22/2021 1134 Gross per 24 hour  Intake 913.74 ml  Output 1200 ml  Net -286.26 ml    Last Weight  Most recent update: 02/16/2021  3:36 AM    Weight  54.1 kg (119 lb 4.3 oz)            Gen:  Elderly Frail Caucasian F in NAD HEENT: moist mucous membranes CV: Irregular rate and rhythm  PULM: On RA ABD: soft/nontender  EXT: No edema  Neuro: Alert and oriented x1-2  SUMMARY OF  RECOMMENDATIONS   DNAR/DNI   MOST Completed, paper copy placed onto the chart electric copy can be found in [facility]yn[employer]  DNR Form Completed, paper copy placed onto the chart electric copy can be found in Vyn[employer]  Have requested a copy of advance directives to be brought in for filing in [place] to transition to [facility] SN[facility] - if not thriving they are open to hospice through [facility]   Ongoing PMT support   Code Status/Advance Care  Planning: DNAR/DNI   Symptom Management:  Right Hip Pain: - Tylenol 1g PO Q8H  Constipation: - TW enema this afternoon - Miralax BID  Anxiety: - Add Buspar 31m PO BID  MDM - High in the setting of ongoing afib w. RVR, s/p right hip fax repair, dementia. Conversations on escalation versus de-escalation of care as well as advance care planning MOST/DNR forms. ______________________________________________________________________________________ [facility] Team Cell Phone: [phone] utilize secure chat with additional questions, if there is no response within 30 minutes please call the above phone number  Palliative Medicine Team providers are available by phone from 7am to 7pm daily and can be reached through the team cell phone.  Should this patient require assistance outside of these hours, please call the patient's attending physician.

## 2021-02-22 NOTE — NC FL2 (Signed)
[place] LEVEL OF CARE SCREENING TOOL     IDENTIFICATION  Patient Name: Kathy Massey Birthdate: 1927-11-26 Sex: female Admission Date (Current Location): 02/13/2021  [facility] and [place] Number:  Herbalist and Address:  [facility], [place] [address] [place], [place] [address]      Provider Number: [id number]  Attending Physician Name and Address:  Caren Griffins, MD  Relative Name and Phone Number:       Current Level of Care: Hospital Recommended Level of Care: [place] Prior Approval Number:    Date Approved/Denied:   PASRR Number: [id number]  Discharge Plan:      Current Diagnoses: Patient Active Problem List   Diagnosis Date Noted   Protein-calorie malnutrition, severe 02/22/2021   CHF (congestive heart failure) ([place])    Fracture, intertrochanteric, right femur, closed, initial encounter ([place])    Hip fracture ([place]) 02/13/2021   DNR (do not resuscitate) 02/13/2021   Hyperglycemia 02/13/2021   Blood clotting disorder ([place]) 12/14/2020   Mixed Alzheimer's and vascular dementia ([place]) 04/13/2020   Alzheimer's dementia with behavioral disturbance ([place]) 10/13/2019   Open wound of scalp 03/01/2018   Anticoagulant long-term use 07/20/2017   Stage 3a chronic kidney disease (CKD) ([place]) 07/20/2017   Dizziness 07/20/2017   Other thrombophilia ([place]) 07/04/2017   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 03/28/2017   Acute gout of right ankle 09/09/2014   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 ([place]) 12/11/2013   BCC (basal cell carcinoma) 10/22/2012   Diverticulosis 10/22/2012   Iron deficiency anemia 10/22/2012   Atrial fibrillation ([place]) 10/22/2012    Orientation RESPIRATION BLADDER Height & Weight     Self  Normal External catheter, Continent Weight: 119 lb 4.3 oz (54.1 kg) Height:  4\' 11"  (149.9 cm)  BEHAVIORAL SYMPTOMS/MOOD NEUROLOGICAL BOWEL NUTRITION STATUS      Continent Diet  (please see discharge summary)  AMBULATORY STATUS COMMUNICATION OF NEEDS Skin   Extensive Assist Verbally Surgical wounds (closed incision RT Hip)                       Personal Care Assistance Level of Assistance  Bathing, Feeding, Dressing Bathing Assistance: Maximum assistance Feeding assistance: Limited assistance Dressing Assistance: Maximum assistance     Functional Limitations Info  Sight, Hearing, Speech Sight Info: Adequate Hearing Info: Adequate      SPECIAL CARE FACTORS FREQUENCY  PT (By licensed PT), OT (By licensed OT)     PT Frequency: 5x per week OT Frequency: 5x per week            Contractures Contractures Info: Not present    Additional Factors Info  Code Status, Allergies Code Status Info: DNR Allergies Info: NKA           Current Medications (02/22/2021):  This is the current hospital active medication list Current Facility-Administered Medications  Medication Dose Route Frequency Provider Last Rate Last Admin   acetaminophen (TYLENOL) tablet 1,000 mg  1,000 mg Oral Q8H Rosezella Rumpf, NP   1,000 mg at 02/22/21 0555   amiodarone (PACERONE) tablet 200 mg  200 mg Oral Daily Josue Hector, MD   200 mg at 02/22/21 1102   apixaban (ELIQUIS) tablet 2.5 mg  2.5 mg Oral BID Vanetta Mulders, MD   2.5 mg at 02/22/21 1103   atorvastatin (LIPITOR) tablet 20 mg  20 mg Oral QHS Caren Griffins, MD   20 mg  at 02/21/21 2132   bisacodyl (DULCOLAX) EC tablet 5 mg  5 mg Oral Daily PRN Vanetta Mulders, MD   5 mg at 02/20/21 5374   Chlorhexidine Gluconate Cloth 2 % PADS 6 each  6 each Topical Daily Vanetta Mulders, MD   6 each at 02/21/21 0929   docusate sodium (COLACE) capsule 100 mg  100 mg Oral BID Vanetta Mulders, MD   100 mg at 02/21/21 2131   escitalopram (LEXAPRO) tablet 5 mg  5 mg Oral Daily Caren Griffins, MD   5 mg at 02/22/21 1103   feeding supplement (ENSURE ENLIVE / ENSURE PLUS) liquid 237 mL  237 mL Oral BID BM Sheikh, Omair Latif, DO    237 mL at 02/22/21 1103   furosemide (LASIX) tablet 40 mg  40 mg Oral Daily Buford Dresser, MD   40 mg at 02/22/21 1102   Glycerin (Adult) 2.1 g suppository 1 suppository  1 suppository Rectal Daily PRN Caren Griffins, MD       hydrOXYzine (ATARAX) tablet 5 mg  5 mg Oral TID PRN Caren Griffins, MD   5 mg at 02/22/21 1103   MEDLINE mouth rinse  15 mL Mouth Rinse BID Vanetta Mulders, MD   15 mL at 02/21/21 2132   memantine (NAMENDA) tablet 10 mg  10 mg Oral BID Caren Griffins, MD   10 mg at 02/22/21 1103   methocarbamol (ROBAXIN) tablet 500 mg  500 mg Oral Q6H PRN Vanetta Mulders, MD   500 mg at 02/22/21 8270   Or   methocarbamol (ROBAXIN) 500 mg in dextrose 5 % 50 mL IVPB  500 mg Intravenous Q6H PRN Vanetta Mulders, MD       midodrine (PROAMATINE) tablet 10 mg  10 mg Oral TID WC Caren Griffins, MD   10 mg at 02/22/21 1104   morphine 2 MG/ML injection 2 mg  2 mg Intravenous Q2H PRN Vanetta Mulders, MD   2 mg at 02/18/21 2218   multivitamin with minerals tablet 1 tablet  1 tablet Oral Daily Raiford Noble Henderson, DO   1 tablet at 02/21/21 0923   ondansetron (ZOFRAN) injection 4 mg  4 mg Intravenous Q6H PRN Vanetta Mulders, MD   4 mg at 02/17/21 7867   oxyCODONE (Oxy IR/ROXICODONE) immediate release tablet 5-10 mg  5-10 mg Oral Q4H PRN Vanetta Mulders, MD   5 mg at 02/22/21 5449   polyethylene glycol (MIRALAX / GLYCOLAX) packet 17 g  17 g Oral BID Rosezella Rumpf, NP   17 g at 02/21/21 2132   QUEtiapine (SEROQUEL) tablet 25 mg  25 mg Oral QHS Rosezella Rumpf, NP   25 mg at 02/21/21 2132     Discharge Medications: Please see discharge summary for a list of discharge medications.  Relevant Imaging Results:  Relevant Lab Results:   Additional Information SSN # [id number]  will need palliative care to follow at [facility]  Vinie Sill, LCSW

## 2021-02-22 NOTE — Progress Notes (Signed)
Mobility Specialist: Progress Note   02/22/21 1209  Mobility  Activity Transferred:  Bed to chair  Level of Assistance +2 (takes two people)  Assistive Device Stedy  Mobility Out of bed to chair with meals  Mobility Response Tolerated well  Mobility performed by Mobility specialist;Other (comment) (PT Jaclyn)  Bed Position Chair   +2 help for PT.   [facility] Reha Martinovich Mobility Specialist Mobility Specialist 4 [place]: [phone] Mobility Specialist 2 [place]: [phone]

## 2021-02-22 NOTE — Progress Notes (Signed)
PROGRESS NOTE  OFFIE PICKRON LNL:[id number] DOB: 01-01-1928 DOA: 02/13/2021 PCP: [name], MD   LOS: 9 days   Brief Narrative / Interim history: 88 year old female with history of A. fib on Eliquis, CAD, HTN, dementia, history of lymphoma who came into the hospital and was admitted on 02/13/2021 after a ground-level fall.  She was found on the floor during the morning hours, could not recall what happened.  She was found to have a hip fracture on the right side and orthopedics was consulted, she is status post right hip intertrochanteric fixation with nail on 1/2.  Hospital course complicated by A. fib with RVR with rates into the 150s for which cardiology was consulted.  Due to persistent hypotension with diltiazem as well as amiodarone she was transferred to stepdown on 1/4.  She is confused, anxious, no significant complaints  Subjective / 24h Interval events: Remains confused, anxious this morning  Assessment & Plan: Principal Problem Persistent A. fib with RVR, hypotension-postoperatively she developed A. fib with RVR.  She was initially tried on diltiazem but developed hypotension.  She was also hypotensive with amiodarone requiring initiation of midodrine.  In discussion with cardiology as well as family it appears that her A. fib with RVR and hypotension have been issues going back at least 3 years.  She was loaded with IV amiodarone and currently converted on oral amnio 200 mg daily.  Discussed with Dr. Johnsie Cancel today, rate is fair and she is not really a candidate for any other agents.  There is no easy solution here but to continue amiodarone and give her a chance for rehab following her hip fracture -2D echo done shows EF 30-35%, global hypokinesis of the LV.  Query tachycardia induced.  Appreciate cardiology follow-up  Active Problems Right hip fracture-due to mechanical fall, orthopedic surgery consulted and she is status post nail intertrochanteric hip fixation.  Per Ortho,  weightbearing as tolerated.  Needs SNF, TOC following.  Anticoagulated with Eliquis  Goals of care-ongoing, palliative consulted.  She is approaching hospice given cardiac issues that do not have a good solution, poor p.o. intake, poor mobility following her hip fracture.  Family wants to give her a trial to get better with plans for SNF with palliative following, and if she continues to decline to transition to hospice.  Today she was transitioned on oral amiodarone, I have contacted social worker to plan for SNF  Hyperglycemia-no known diabetes.  Likely transient in the setting of acute stress.  Continue to monitor CBGs with morning labs.  Labs remained unremarkable  Hypokalemia-replete and monitor  Chronic kidney disease stage IIIa-Baseline creatinine 1.1-1.2, currently at baseline.  Continue oral Lasix  Acute on chronic systolic and diastolic CHF-repeat 2D echo shows EF 30-35%, moderately decreased LV function.  LV has global hypokinesis.  Management per cardiology, on oral Lasix, not likely to tolerate ACE inhibitors/beta-blockers due to hypotension  Underlying dementia-mixed Alzheimer and vascular.  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 She was treated with acalabrutinib for recurrent disease, stopped due to toxicity. No obvious evidence of recurrence, followed by Dr. Marin Olp   Acute postoperative blood loss anemia superimposed on normocytic anemia/anemia of chronic disease -monitor, hemoglobin remained stable  Thrombocytopenia -Likely consumptive, platelets normalized now  Scheduled Meds:  acetaminophen  1,000 mg Oral Q8H   amiodarone  200 mg Oral Daily   apixaban  2.5 mg Oral BID   atorvastatin  20 mg Oral QHS   Chlorhexidine Gluconate Cloth  6 each Topical Daily   docusate sodium  100  mg Oral BID   escitalopram  5 mg Oral Daily   feeding supplement  237 mL Oral BID BM   furosemide  40 mg Oral Daily   mouth rinse  15 mL Mouth Rinse BID   memantine  10 mg Oral BID   midodrine  10 mg Oral TID WC   multivitamin with minerals  1 tablet Oral Daily   polyethylene glycol  17 g Oral BID   QUEtiapine  25 mg Oral QHS   Continuous Infusions:  methocarbamol (ROBAXIN) IV     PRN Meds:.bisacodyl, Glycerin (Adult), hydrOXYzine, methocarbamol **OR** methocarbamol (ROBAXIN) IV, morphine injection, ondansetron (ZOFRAN) IV, oxyCODONE  Diet Orders (From admission, onward)     Start     Ordered   02/18/21 1521  Diet regular Room service appropriate? Yes; Fluid consistency: Thin; Fluid restriction: 2000 mL Fluid  Diet effective now       Question Answer Comment  Room service appropriate? Yes   Fluid consistency: Thin   Fluid restriction: 2000 mL Fluid      02/18/21 1521            DVT prophylaxis: apixaban (ELIQUIS) tablet 2.5 mg Start: 02/15/21 1115 SCDs Start: 02/13/21 1219 apixaban (ELIQUIS) tablet 2.5 mg     Code Status: DNR  Family Communication: No family at bedside, discussed with daughter last night  Status is: Inpatient  Remains inpatient appropriate because: SNF planning  Level of care: Progressive  Consultants:  Cardiology  Orthopedic surgery   Procedures:  2D echo  Microbiology  none  Antimicrobials: none    Objective: Vitals:   02/21/21 2006 02/21/21 2313 02/22/21 0359 02/22/21 0724  BP: 108/80 (!) 102/59 (!) 137/100 120/74  Pulse: 89 100 88 89  Resp: 20 20 19 20   Temp: 98.2 F (36.8 C) 98.4 F (36.9 C) 98.2 F (36.8 C) 97.6 F (36.4 C)  TempSrc: Oral Oral Oral Oral  SpO2: 98% 96% 100% 100%  Weight:      Height:        Intake/Output Summary (Last 24 hours) at 02/22/2021 1107 Last data filed at 02/22/2021 0500 Gross per 24 hour  Intake 988.74 ml  Output 200 ml  Net 788.74 ml    Filed Weights   02/13/21 0855 02/14/21 0911  02/16/21 0336  Weight: 49.9 kg 49.9 kg 54.1 kg    Examination:  Constitutional: NAD Eyes: No icterus ENMT: Moist mucous membranes Neck: normal, supple Respiratory: Faint bibasilar crackles, no wheezing Cardiovascular: Irregular Abdomen: Soft, NT, ND, positive bowel sounds Musculoskeletal: no clubbing / cyanosis.  Skin: No rashes seen Neurologic: No focal deficits   Data Reviewed: I have independently reviewed following labs and imaging studies   CBC: Recent Labs  Lab 02/16/21 0217 02/17/21 0326 02/18/21 0418 02/19/21 0135 02/20/21 0135 02/22/21 0307  WBC 4.6 5.1 5.2 6.1 6.8 7.8  NEUTROABS 3.6  --   --   --   --   --   HGB 9.4* 9.4* 9.8* 9.7* 9.6* 9.3*  HCT 29.5* 30.3* 31.3* 29.9* 30.1* 29.3*  MCV 99.3 101.0* 98.7 96.1 96.8 96.4  PLT 116* 140* 158 195 201 818    Basic Metabolic Panel: Recent Labs  Lab 02/16/21 0217 02/17/21 0326 02/18/21 0418 02/19/21 0135 02/19/21 1040 02/20/21 0135 02/21/21 0149 02/22/21 0307  NA 137 135 136 137  --  134* 134* 135  K 3.7 4.2 4.1 3.2*  --  4.4 4.5 3.8  CL 105 105 103 101  --  100 100 99  CO2 25 26 23 26   --  25 25 25   GLUCOSE 108* 91 106* 118*  --  96 87 177*  BUN 21 23 27* 26*  --  24* 22 22  CREATININE 1.22* 1.16* 1.22* 1.16*  --  1.13* 1.06* 1.26*  CALCIUM 8.2* 8.3* 8.1* 8.2*  --  8.2* 8.5* 8.0*  MG 2.1 2.1  --   --  1.9  --   --   --   PHOS 3.8 3.5  --   --   --   --   --   --     Liver Function Tests: Recent Labs  Lab 02/17/21 0326 02/18/21 0418 02/19/21 0135 02/20/21 0135 02/22/21 0307  AST 20 30 29  50* 36  ALT 6 5 9 13 12   ALKPHOS 88 93 119 184* 226*  BILITOT 1.1 1.4* 1.6* 1.4* 1.1  PROT 4.7* 4.7* 5.0* 4.9* 4.7*  ALBUMIN 2.4* 2.3* 2.3* 2.2* 2.2*    Coagulation Profile: No results for input(s): INR, PROTIME in the last 168 hours. HbA1C: No results for input(s): HGBA1C in the last 72 hours. CBG: No results for input(s): GLUCAP in the last 168 hours.  Recent Results (from the past 240 hour(s))   Resp Panel by RT-PCR (Flu A&B, Covid) Nasopharyngeal Swab     Status: None   Collection Time: 02/13/21 11:44 AM   Specimen: Nasopharyngeal Swab; Nasopharyngeal(NP) swabs in vial transport medium  Result Value Ref Range Status   SARS Coronavirus 2 by RT PCR NEGATIVE NEGATIVE Final    Comment: (NOTE) SARS-CoV-2 target nucleic acids are NOT DETECTED.  The SARS-CoV-2 RNA is generally detectable in upper respiratory specimens during the acute phase of infection. The lowest concentration of SARS-CoV-2 viral copies this assay can detect is 138 copies/mL. A negative result does not preclude SARS-Cov-2 infection and should not be used as the sole basis for treatment or other patient management decisions. A negative result may occur with  improper specimen collection/handling, submission of specimen other than nasopharyngeal swab, presence of viral mutation(s) within the areas targeted by this assay, and inadequate number of viral copies(<138 copies/mL). A negative result must be combined with clinical observations, patient history, and epidemiological information. The expected result is Negative.  Fact Sheet for Patients:  EntrepreneurPulse.com.au  Fact Sheet for Healthcare Providers:  IncredibleEmployment.be  This test is no t yet approved or cleared by the [place] FDA and  has been authorized for detection and/or diagnosis of SARS-CoV-2 by FDA under an Emergency Use Authorization (EUA). This EUA will remain  in effect (meaning this test can be used) for the duration of the COVID-19 declaration under Section 564(b)(1) of the Act, 21 U.S.C.section 360bbb-3(b)(1), unless the authorization is terminated  or revoked sooner.       Influenza A by PCR NEGATIVE NEGATIVE Final   Influenza B by PCR NEGATIVE NEGATIVE Final    Comment: (NOTE) The Xpert Xpress SARS-CoV-2/FLU/RSV plus assay is intended as an aid in the diagnosis of influenza from  Nasopharyngeal swab specimens and should not be used as a sole basis for treatment. Nasal washings and aspirates are unacceptable for Xpert Xpress SARS-CoV-2/FLU/RSV testing.  Fact Sheet for Patients: EntrepreneurPulse.com.au  Fact Sheet for Healthcare Providers: IncredibleEmployment.be  This test is not yet approved or cleared by the [place] FDA and has been authorized for detection  and/or diagnosis of SARS-CoV-2 by FDA under an Emergency Use Authorization (EUA). This EUA will remain in effect (meaning this test can be used) for the duration of the COVID-19 declaration under Section 564(b)(1) of the Act, 21 U.S.C. section 360bbb-3(b)(1), unless the authorization is terminated or revoked.  Performed at [facility], [place] [address], [place], [place] [address]   Surgical pcr screen     Status: None   Collection Time: 02/13/21  8:24 PM   Specimen: Nasal Mucosa; Nasal Swab  Result Value Ref Range Status   MRSA, PCR NEGATIVE NEGATIVE Final   Staphylococcus aureus NEGATIVE NEGATIVE Final    Comment: (NOTE) The Xpert SA Assay (FDA approved for NASAL specimens in patients 26 years of age and older), is one component of a comprehensive surveillance program. It is not intended to diagnose infection nor to guide or monitor treatment. Performed at [facility], [place] [address] [place], [place] [address]       Radiology Studies: No results found.    Marzetta Board, MD, PhD [facility]  Between 7 am - 7 pm I am available, please contact me via Amion (for emergencies) or Securechat (non urgent messages)  Between 7 pm - 7 am I am not available, please contact night coverage MD/APP via Amion

## 2021-02-22 NOTE — Progress Notes (Signed)
Physical Therapy Treatment Patient Details Name: Kathy Massey MRN: [id number] DOB: 07/13/1927 Today's Date: 02/22/2021   History of Present Illness Pt is a 88 y.o. female admitted from memory care unit on 02/14/20 after unwitnessed fall sustaining R intertrochanteric hip fx. S/p R hip IMN 1/2. Course complicated by afib with RVR. PMH includes advanced dementia, afib on Eliquis, CAD, HTN, lymphoma.   PT Comments    Pt progressing with mobility. Today's session focused on transfer training and standing activity with Stedy standing frame, pt requiring mod-maxA+2, demonstrates improving ability to WB through RLE despite significant pain. Pt remains limited by pain, generalized weakness, decreased activity tolerance, poor balance strategies/postural reactions and cognitive impairments. Pt pleasantly confused, cooperative throughout session. Will continue to follow acutely to address established goals.  HR 100s Sitting BP - 99/66 Post-standing BP - 123/85    Recommendations for follow up therapy are one component of a multi-disciplinary discharge planning process, led by the attending physician.  Recommendations may be updated based on patient status, additional functional criteria and insurance authorization.  Follow Up Recommendations  Skilled nursing-short term rehab (<3 hours/day)     Assistance Recommended at Discharge Frequent or constant Supervision/Assistance  Patient can return home with the following Two people to help with walking and/or transfers;A lot of help with bathing/dressing/bathroom;Assistance with cooking/housework;Assistance with feeding;Direct supervision/assist for medications management;Assist for transportation (at ALF/memory care unit)   Equipment Recommendations  Wheelchair (measurements PT);Wheelchair cushion (measurements PT);Hospital bed;Other (comment) (lift equipment)    Recommendations for Other Services       Precautions / Restrictions  Precautions Precautions: Fall Restrictions Weight Bearing Restrictions: Yes RLE Weight Bearing: Weight bearing as tolerated     Mobility  Bed Mobility Overal bed mobility: Needs Assistance Bed Mobility: Supine to Sit     Supine to sit: Mod assist;+2 for physical assistance;Max assist;HOB elevated     General bed mobility comments: Mod-maxA+2 for BLE management, trunk elevation and scooting hips to EOB    Transfers Overall transfer level: Needs assistance Equipment used: Ambulation equipment used Transfers: Sit to/from Stand;Bed to chair/wheelchair/BSC Sit to Stand: Mod assist;+2 physical assistance;From elevated surface           General transfer comment: ModA+2 for trunk elevation into stedy standing frame, pt required assist to position R knee flexion, but able to assist well with BUEs pulling on stedy bar; improved eccentric control to sit with minA while holding onto stedy bar; limited by RLE pain Transfer via Lift Equipment: Stedy  Ambulation/Gait                   Stairs             Wheelchair Mobility    Modified Rankin (Stroke Patients Only)       Balance Overall balance assessment: Needs assistance Sitting-balance support: Bilateral upper extremity supported;Feet supported Sitting balance-Leahy Scale: Poor Sitting balance - Comments: reliant on UE support to maintain static sitting   Standing balance support: Bilateral upper extremity supported;During functional activity;Reliant on assistive device for balance Standing balance-Leahy Scale: Poor Standing balance comment: Reliant on external assist and BUE support                            Cognition Arousal/Alertness: Awake/alert Behavior During Therapy: WFL for tasks assessed/performed;Flat affect Overall Cognitive Status: History of cognitive impairments - at baseline  General Comments: H/o advanced dementia. Pt pleasantly  confused, agreeable and requires frequent redirection to task        Exercises General Exercises - Lower Extremity Ankle Circles/Pumps: AAROM;Both;Seated Long Arc Quad: AAROM;Seated;Right    General Comments General comments (skin integrity, edema, etc.): Pt's son and grandson present at beginning and end of session. SpO2 stable on RA, HR 100s; sitting BP 99/66, post-standing BP 123/85      Pertinent Vitals/Pain Pain Assessment: Faces Faces Pain Scale: Hurts whole lot Pain Location: R hip with mobility Pain Descriptors / Indicators: Discomfort;Grimacing;Guarding Pain Intervention(s): Limited activity within patient's tolerance;Monitored during session;Repositioned    Home Living                          Prior Function            PT Goals (current goals can now be found in the care plan section) Progress towards PT goals: Progressing toward goals    Frequency    Min 3X/week      PT Plan Current plan remains appropriate    Co-evaluation              AM-PAC PT "6 Clicks" Mobility   Outcome Measure  Help needed turning from your back to your side while in a flat bed without using bedrails?: A Lot Help needed moving from lying on your back to sitting on the side of a flat bed without using bedrails?: Total Help needed moving to and from a bed to a chair (including a wheelchair)?: Total Help needed standing up from a chair using your arms (e.g., wheelchair or bedside chair)?: Total Help needed to walk in hospital room?: Total Help needed climbing 3-5 steps with a railing? : Total 6 Click Score: 7    End of Session Equipment Utilized During Treatment: Gait belt Activity Tolerance: Patient tolerated treatment well Patient left: in chair;with call bell/phone within reach;with family/visitor present Nurse Communication: Mobility status;Other (comment) (NT to get new batteries for chair alarm box) PT Visit Diagnosis: Unsteadiness on feet (R26.81);Other  abnormalities of gait and mobility (R26.89);Muscle weakness (generalized) (M62.81);History of falling (Z91.81);Pain Pain - Right/Left: Right Pain - part of body: Hip     Time: 2637-8588 PT Time Calculation (min) (ACUTE ONLY): 25 min  Charges:  $Therapeutic Activity: 23-37 mins                     Kathy Massey, PT, DPT Acute Rehabilitation Services  Pager [phone] Office [phone]  Derry Lory 02/22/2021, 12:37 PM

## 2021-02-22 NOTE — TOC Progression Note (Addendum)
Transition of Care [facility]) - Progression Note    Patient Details  Name: Kathy Massey MRN: [id number] Date of Birth: [birth year]  Transition of Care [facility]) CM/SW [place], [place] Phone Number: 02/22/2021, 11:52 AM  Clinical Narrative:     Rec'd message from attending - patient is ready to d/c back to [place]. SNF will need to get insurance authorization prior to d/c to SNF.   Called [facility]- Left voice message to return call.   Thurmond Butts, MSW, LCSW Clinical Social Worker    Expected Discharge Plan: Skilled Nursing Facility Barriers to Discharge: Continued Medical Work up, Ship broker  Expected Discharge Plan and Services Expected Discharge Plan: [place]       Living arrangements for the past 2 months: [place]                                       Social Determinants of Health (SDOH) Interventions    Readmission Risk Interventions No flowsheet data found.

## 2021-02-22 NOTE — Progress Notes (Signed)
Mobility Specialist: Progress Note   02/22/21 1558  Mobility  Activity Transferred:  Chair to bed  Level of Assistance +2 (takes two people)  Assistive Device Stedy  Mobility Out of bed to chair with meals  Mobility Response Tolerated fair  Mobility performed by Mobility specialist;Nurse tech  $Mobility charge 1 Mobility   Post-Mobility: 90 HR, 98% SpO2  Pt assisted back to bed with help from NT. Pt c/o RLE pain during transfer, no rating given. Pt back to bed with NT present in the room.   [facility] Emmalena Canny Mobility Specialist Mobility Specialist 4 [place]: [phone] Mobility Specialist 2 [place] and [place]: [phone]

## 2021-02-23 DIAGNOSIS — I4819 Other persistent atrial fibrillation: Secondary | ICD-10-CM | POA: Diagnosis not present

## 2021-02-23 DIAGNOSIS — S72001D Fracture of unspecified part of neck of right femur, subsequent encounter for closed fracture with routine healing: Secondary | ICD-10-CM | POA: Diagnosis not present

## 2021-02-23 DIAGNOSIS — I5020 Unspecified systolic (congestive) heart failure: Secondary | ICD-10-CM | POA: Diagnosis not present

## 2021-02-23 DIAGNOSIS — N1831 Chronic kidney disease, stage 3a: Secondary | ICD-10-CM | POA: Diagnosis not present

## 2021-02-23 LAB — CBC
HCT: 31.6 % — ABNORMAL LOW (ref 36.0–46.0)
Hemoglobin: 9.9 g/dL — ABNORMAL LOW (ref 12.0–15.0)
MCH: 30.1 pg (ref 26.0–34.0)
MCHC: 31.3 g/dL (ref 30.0–36.0)
MCV: 96 fL (ref 80.0–100.0)
Platelets: 337 10*3/uL (ref 150–400)
RBC: 3.29 MIL/uL — ABNORMAL LOW (ref 3.87–5.11)
RDW: 16.5 % — ABNORMAL HIGH (ref 11.5–15.5)
WBC: 6.3 10*3/uL (ref 4.0–10.5)
nRBC: 0 % (ref 0.0–0.2)

## 2021-02-23 LAB — BASIC METABOLIC PANEL
Anion gap: 8 (ref 5–15)
BUN: 22 mg/dL (ref 8–23)
CO2: 26 mmol/L (ref 22–32)
Calcium: 8 mg/dL — ABNORMAL LOW (ref 8.9–10.3)
Chloride: 102 mmol/L (ref 98–111)
Creatinine, Ser: 1.35 mg/dL — ABNORMAL HIGH (ref 0.44–1.00)
GFR, Estimated: 37 mL/min — ABNORMAL LOW (ref >60)
Glucose, Bld: 83 mg/dL (ref 70–99)
Potassium: 4.3 mmol/L (ref 3.5–5.1)
Sodium: 136 mmol/L (ref 135–145)

## 2021-02-23 MED ORDER — CEFAZOLIN SODIUM-DEXTROSE 2-4 GM/100ML-% IV SOLN
2.0000 g | Freq: Three times a day (TID) | INTRAVENOUS | Status: AC
  Administered 2021-02-23: 2 g via INTRAVENOUS
  Filled 2021-02-23: qty 100

## 2021-02-23 NOTE — Progress Notes (Signed)
Mobility Specialist: Progress Note   02/23/21 1503  Mobility  Activity Transferred to/from [facility]  Level of Assistance +2 (takes two people)  Information systems manager Ambulated (ft) 2 ft  Mobility Out of bed for toileting  Mobility Response Tolerated fair  Mobility performed by Mobility specialist;Nurse tech  $Mobility charge 1 Mobility   Pt assisted to [facility] with verbal cues and +2 physical assistance to stand, BM successful. Pt able to stand easier when R knee blocked. Able to take short shuffle steps to [facility] but unable to pivot back to the bed requiring max +2 physical assistance to return to the bed. NT present in the room.   [facility] Kaena Santori Mobility Specialist Mobility Specialist 4 [place]: [phone] Mobility Specialist 2 [place] and [place]: [phone]

## 2021-02-23 NOTE — Progress Notes (Signed)
Right hip incision has some minimal serous drainage. New foam dressing was changed. Pt remains afebrile, hemodynamics stable with Atrial fibrillation, heart rated under controlled 80s-100. BP within normal limits. Pt slept well tonight. No acute distress noted. We will continue to monitor.  Kennyth Lose, RN

## 2021-02-23 NOTE — Progress Notes (Signed)
Patient ID: Kathy Massey, female   DOB: [birth year], 88 y.o.   MRN: [id number]  PROGRESS NOTE    ESTER HILLEY  FWY:[id number] DOB: Aug 29, 1927 DOA: 02/13/2021 PCP: [name], MD   Brief Narrative:  88 year old female with history of A. fib on Eliquis, CAD, HTN, dementia, history of lymphoma presented on 02/13/2021 with ground-level fall.  She was found to have right hip fracture.  She underwent right hip intertrochanteric fixation with nail on 02/14/2021.  Hospital course complicated by A. fib with RVR.  Cardiology was consulted.  Due to persistent hypotension with diltiazem as well as amiodarone, she was transferred to stepdown on 02/16/2021.  Palliative care was consulted.  Assessment & Plan:   Persistent A. fib with episodes of hypotension -She was initially tried on diltiazem but developed hypotension.  -She was also hypotensive with amiodarone requiring initiation of midodrine. -Currently rate controlled and blood pressure is stable. -Continue oral amiodarone and Eliquis.  Outpatient follow-up with cardiology.  Acute on chronic systolic and diastolic heart failure -Echo showed EF of 30 to 35%. -Currently on oral Lasix as per cardiology.  Not likely to tolerate ACE inhibitor/beta-blocker due to hypotension. -Strict input and output.  Daily weights.  Fluid restriction.  Right hip fracture due to mechanical fall -Status post surgical intervention with nail intertrochanteric hip fixation on 02/14/2021. -Activity as per physical therapy and orthopedics. -Currently anticoagulated with Eliquis. -Wound care as per orthopedics  Goals of care Underlying dementia -Overall prognosis is guarded to poor.   -She is approaching hospice given cardiac issues that do not have a good solution, poor p.o. intake, poor mobility following her hip fracture.  Family wants to give her a trial to get better with plans for SNF with palliative following, and if she continues to decline: then to transition to  hospice.  -Continue memantine  Chronically disease stage IIIa -Baseline creatinine around 1.1-1.2 -Creatinine stable.  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 Marin Olp  Acute postoperative blood loss anemia superimposed on anemia of chronic disease -Hemoglobin stable.  Monitor intermittently  DVT prophylaxis: Eliquis Code Status: DNR Family Communication: None at bedside Disposition Plan: Status is: Inpatient  Remains inpatient appropriate because: Of need for SNF placement  Consultants: Cardiology/orthopedic surgery/palliative care  Procedures: 2D echo/right hip intertrochanteric fixation with nail on 02/14/2021  Antimicrobials:  Anti-infectives (From admission, onward)    Start     Dose/Rate Route Frequency Ordered Stop   02/23/21 1400  ceFAZolin (ANCEF) IVPB 2g/100 mL premix        2 g 200 mL/hr over 30 Minutes Intravenous Every 8 hours 02/23/21 1310 02/23/21 2159   02/14/21 1800  ceFAZolin (ANCEF) IVPB 2g/100 mL premix        2 g 200 mL/hr over 30 Minutes Intravenous Every 12 hours 02/14/21 1324 02/15/21 0608   02/14/21 1400  ceFAZolin (ANCEF) IVPB 2g/100 mL premix  Status:  Discontinued        2 g 200 mL/hr over 30 Minutes Intravenous Every 8 hours 02/14/21 1306 02/14/21 1322   02/14/21 1104  vancomycin (VANCOCIN) powder  Status:  Discontinued          As needed 02/14/21 1104 02/14/21 1123   02/14/21 0915  ceFAZolin (ANCEF) IVPB 2g/100 mL premix        2 g 200 mL/hr over 30 Minutes Intravenous On call to O.R. 02/14/21 0902 02/14/21 1031   02/14/21 0906  ceFAZolin (ANCEF) 2-4 GM/100ML-%  IVPB       Note to Pharmacy: [facility]: cabinet override      02/14/21 6578 02/14/21 1052        Subjective: Patient seen and examined at bedside.  Poor historian.  No overnight fever, vomiting, seizures reported.  Objective: Vitals:   02/22/21 2318 02/23/21 0409 02/23/21 0824  02/23/21 1207  BP: 118/87 120/78 120/85 101/60  Pulse: 90 96 95 83  Resp: 20 20 20 20   Temp: 97.6 F (36.4 C) (!) 97.5 F (36.4 C) 97.9 F (36.6 C) 98.4 F (36.9 C)  TempSrc: Oral Oral Oral Oral  SpO2: 95% 95% 97% 100%  Weight:      Height:        Intake/Output Summary (Last 24 hours) at 02/23/2021 1324 Last data filed at 02/23/2021 0830 Gross per 24 hour  Intake 730 ml  Output 1400 ml  Net -670 ml   Filed Weights   02/13/21 0855 02/14/21 0911 02/16/21 0336  Weight: 49.9 kg 49.9 kg 54.1 kg    Examination:  General exam: Appears calm and comfortable.  Looks chronically ill and deconditioned.  Currently on room air.  Extremely slow to respond.  Poor historian.  Confused. Respiratory system: Bilateral decreased breath sounds at bases with scattered crackles Cardiovascular system: S1 & S2 heard, Rate controlled currently Gastrointestinal system: Abdomen is nondistended, soft and nontender. Normal bowel sounds heard. Extremities: No cyanosis, clubbing; trace lower extremity edema present Central nervous system: Awake, confused.  No focal neurological deficits. Moving extremities Skin: No obvious ecchymosis/lesions Psychiatry: Affect is extremely flat.  Does not participate in conversation much.   Data Reviewed: I have personally reviewed following labs and imaging studies  CBC: Recent Labs  Lab 02/18/21 0418 02/19/21 0135 02/20/21 0135 02/22/21 0307 02/23/21 0444  WBC 5.2 6.1 6.8 7.8 6.3  HGB 9.8* 9.7* 9.6* 9.3* 9.9*  HCT 31.3* 29.9* 30.1* 29.3* 31.6*  MCV 98.7 96.1 96.8 96.4 96.0  PLT 158 195 201 269 469   Basic Metabolic Panel: Recent Labs  Lab 02/17/21 0326 02/18/21 0418 02/19/21 0135 02/19/21 1040 02/20/21 0135 02/21/21 0149 02/22/21 0307 02/23/21 0444  NA 135   < > 137  --  134* 134* 135 136  K 4.2   < > 3.2*  --  4.4 4.5 3.8 4.3  CL 105   < > 101  --  100 100 99 102  CO2 26   < > 26  --  25 25 25 26   GLUCOSE 91   < > 118*  --  96 87 177* 83  BUN  23   < > 26*  --  24* 22 22 22   CREATININE 1.16*   < > 1.16*  --  1.13* 1.06* 1.26* 1.35*  CALCIUM 8.3*   < > 8.2*  --  8.2* 8.5* 8.0* 8.0*  MG 2.1  --   --  1.9  --   --   --   --   PHOS 3.5  --   --   --   --   --   --   --    < > = values in this interval not displayed.   GFR: Estimated Creatinine Clearance: 20.4 mL/min (A) (by C-G formula based on SCr of 1.35 mg/dL (H)). Liver Function Tests: Recent Labs  Lab 02/17/21 0326 02/18/21 0418 02/19/21 0135 02/20/21 0135 02/22/21 0307  AST 20 30 29  50* 36  ALT 6 5 9 13 12   ALKPHOS 88 93 119 184*  226*  BILITOT 1.1 1.4* 1.6* 1.4* 1.1  PROT 4.7* 4.7* 5.0* 4.9* 4.7*  ALBUMIN 2.4* 2.3* 2.3* 2.2* 2.2*   No results for input(s): LIPASE, AMYLASE in the last 168 hours. No results for input(s): AMMONIA in the last 168 hours. Coagulation Profile: No results for input(s): INR, PROTIME in the last 168 hours. Cardiac Enzymes: No results for input(s): CKTOTAL, CKMB, CKMBINDEX, TROPONINI in the last 168 hours. BNP (last 3 results) No results for input(s): PROBNP in the last 8760 hours. HbA1C: No results for input(s): HGBA1C in the last 72 hours. CBG: No results for input(s): GLUCAP in the last 168 hours. Lipid Profile: No results for input(s): CHOL, HDL, LDLCALC, TRIG, CHOLHDL, LDLDIRECT in the last 72 hours. Thyroid Function Tests: Recent Labs    02/21/21 0907  TSH 3.267   Anemia Panel: No results for input(s): VITAMINB12, FOLATE, FERRITIN, TIBC, IRON, RETICCTPCT in the last 72 hours. Sepsis Labs: No results for input(s): PROCALCITON, LATICACIDVEN in the last 168 hours.  Recent Results (from the past 240 hour(s))  Surgical pcr screen     Status: None   Collection Time: 02/13/21  8:24 PM   Specimen: Nasal Mucosa; Nasal Swab  Result Value Ref Range Status   MRSA, PCR NEGATIVE NEGATIVE Final   Staphylococcus aureus NEGATIVE NEGATIVE Final    Comment: (NOTE) The Xpert SA Assay (FDA approved for NASAL specimens in patients  77 years of age and older), is one component of a comprehensive surveillance program. It is not intended to diagnose infection nor to guide or monitor treatment. Performed at [facility], [place] [address] [place], [place] [address]   SARS CORONAVIRUS 2 (TAT 6-24 HRS) Nasopharyngeal Nasopharyngeal Swab     Status: None   Collection Time: 02/22/21  6:07 PM   Specimen: Nasopharyngeal Swab  Result Value Ref Range Status   SARS Coronavirus 2 NEGATIVE NEGATIVE Final    Comment: (NOTE) SARS-CoV-2 target nucleic acids are NOT DETECTED.  The SARS-CoV-2 RNA is generally detectable in upper and lower respiratory specimens during the acute phase of infection. Negative results do not preclude SARS-CoV-2 infection, do not rule out co-infections with other pathogens, and should not be used as the sole basis for treatment or other patient management decisions. Negative results must be combined with clinical observations, patient history, and epidemiological information. The expected result is Negative.  Fact Sheet for Patients: SugarRoll.be  Fact Sheet for Healthcare Providers: https://www.woods-mathews.com/  This test is not yet approved or cleared by the [place] FDA and  has been authorized for detection and/or diagnosis of SARS-CoV-2 by FDA under an Emergency Use Authorization (EUA). This EUA will remain  in effect (meaning this test can be used) for the duration of the COVID-19 declaration under Se ction 564(b)(1) of the Act, 21 U.S.C. section 360bbb-3(b)(1), unless the authorization is terminated or revoked sooner.  Performed at [facility], [place] [address] [place], [place] [address]          Radiology Studies: No results found.      Scheduled Meds:  acetaminophen  1,000 mg Oral Q8H   amiodarone  200 mg Oral Daily   apixaban  2.5 mg Oral BID   atorvastatin  20 mg Oral QHS   busPIRone  5 mg Oral BID   docusate  sodium  100 mg Oral BID   escitalopram  5 mg Oral Daily   feeding supplement  237 mL Oral BID BM   furosemide  40 mg Oral Daily  mouth rinse  15 mL Mouth Rinse BID   memantine  10 mg Oral BID   midodrine  10 mg Oral TID WC   multivitamin with minerals  1 tablet Oral Daily   polyethylene glycol  17 g Oral BID   QUEtiapine  25 mg Oral QHS   Continuous Infusions:   ceFAZolin (ANCEF) IV     methocarbamol (ROBAXIN) IV            Aline August, MD [facility] 02/23/2021, 1:24 PM

## 2021-02-23 NOTE — Evaluation (Signed)
Occupational Therapy Evaluation Patient Details Name: Kathy Massey MRN: [id number] DOB: [birth year] Today's Date: 02/23/2021   History of Present Illness Pt is a 88 y.o. female admitted from memory care unit on 02/14/20 after unwitnessed fall sustaining R intertrochanteric hip fx. S/p R hip IMN 1/2. Course complicated by afib with RVR. PMH includes advanced dementia, afib on Eliquis, CAD, HTN, lymphoma.   Clinical Impression   Patient is s/p IMN R Hip surgery resulting in functional limitations due to the deficits listed below (see OT problem list). Pt progressed from bed to chair this session with stedy. Pt benefits from visual cues, 1 step commands and increased time. Pt pleasant and very talkative this session.  Patient will benefit from skilled OT acutely to increase independence and safety with ADLS to allow discharge SNF ( memory deficits).       Recommendations for follow up therapy are one component of a multi-disciplinary discharge planning process, led by the attending physician.  Recommendations may be updated based on patient status, additional functional criteria and insurance authorization.   Follow Up Recommendations  Skilled nursing-short term rehab (<3 hours/day)    Assistance Recommended at Discharge Intermittent Supervision/Assistance  Patient can return home with the following A lot of help with walking and/or transfers;A lot of help with bathing/dressing/bathroom;Assistance with cooking/housework;Assistance with feeding;Direct supervision/assist for medications management;Direct supervision/assist for financial management;Assist for transportation    Functional Status Assessment  Patient has had a recent decline in their functional status and demonstrates the ability to make significant improvements in function in a reasonable and predictable amount of time.  Equipment Recommendations  Hospital bed;Wheelchair (measurements OT);Wheelchair cushion (measurements  OT);BSC/3in1    Recommendations for Other Services       Precautions / Restrictions Precautions Precautions: Fall Precaution Comments: watch BP and HR Restrictions RLE Weight Bearing: Weight bearing as tolerated      Mobility Bed Mobility Overal bed mobility: Needs Assistance Bed Mobility: Supine to Sit Rolling: Min assist   Supine to sit: Min assist;HOB elevated     General bed mobility comments: pt progressed to eob with mod cues for use of UE to reach for rails. pt with mod (A) to scoot buttock to the eob with pad. pt pushing up with L UE. pt with ability to sit neutral with equal weight at EOB    Transfers Overall transfer level: Needs assistance   Transfers: Sit to/from Stand Sit to Stand: +2 physical assistance;Min assist           General transfer comment: pt able to power up from bed surface with stedy. pt with mod cues for positioning and to anterior shift and anterior pelvic tilt for more upright posture Transfer via Lift Equipment: Stedy    Balance Overall balance assessment: Needs assistance   Sitting balance-Leahy Scale: Fair     Standing balance support: Bilateral upper extremity supported;During functional activity;Reliant on assistive device for balance Standing balance-Leahy Scale: Poor                             ADL either performed or assessed with clinical judgement   ADL Overall ADL's : Needs assistance/impaired Eating/Feeding: Set up   Grooming: Wash/dry hands;Minimal assistance Grooming Details (indicate cue type and reason): cues for sequence to use soap Upper Body Bathing: Minimal assistance;Sitting   Lower Body Bathing: Maximal assistance;Sit to/from stand   Upper Body Dressing : Minimal assistance;Sitting   Lower Body Dressing: +2 for physical assistance;Moderate  assistance                 General ADL Comments: pt tolerated sit<>Stand from bed with stedy and sink level stedy use     Vision Baseline  Vision/History: 0 No visual deficits       Perception     Praxis      Pertinent Vitals/Pain Pain Assessment: Faces Faces Pain Scale: Hurts little more Pain Location: R hip with mobility Pain Descriptors / Indicators: Discomfort;Grimacing;Guarding Pain Intervention(s): Monitored during session;Repositioned;Premedicated before session     Hand Dominance Right   Extremity/Trunk Assessment Upper Extremity Assessment Upper Extremity Assessment: Overall WFL for tasks assessed   Lower Extremity Assessment Lower Extremity Assessment: Defer to PT evaluation   Cervical / Trunk Assessment Cervical / Trunk Assessment: Normal   Communication Communication Communication: No difficulties   Cognition Arousal/Alertness: Awake/alert Behavior During Therapy: WFL for tasks assessed/performed;Flat affect Overall Cognitive Status: History of cognitive impairments - at baseline                                       General Comments  Son present throughout the entire session    Exercises Exercises: Other exercises Other Exercises Other Exercises: tolerates sit<>stand in stedy Other Exercises: tolerates attempts at weight shift to lift R LE with diffculty . pt expressing its "heavy"   Shoulder Instructions      Home Living Family/patient expects to be discharged to:: Skilled nursing facility                                 Additional Comments: son present and verbalize pt often doesnt know know family names      Prior Functioning/Environment Prior Level of Function : Patient poor historian/Family not available                        OT Problem List: Decreased strength;Decreased activity tolerance;Impaired balance (sitting and/or standing);Pain;Decreased knowledge of precautions;Decreased knowledge of use of DME or AE;Decreased safety awareness;Decreased cognition      OT Treatment/Interventions: Self-care/ADL training;Therapeutic exercise;DME  and/or AE instruction;Therapeutic activities;Balance training;Patient/family education;Cognitive remediation/compensation;Manual therapy;Modalities    OT Goals(Current goals can be found in the care plan section) Acute Rehab OT Goals Patient Stated Goal: to eat lunch that had arrived OT Goal Formulation: With patient/family Time For Goal Achievement: 03/09/21 Potential to Achieve Goals: Good  OT Frequency: Min 2X/week    Co-evaluation PT/OT/SLP Co-Evaluation/Treatment: Yes Reason for Co-Treatment: For patient/therapist safety;To address functional/ADL transfers;Necessary to address cognition/behavior during functional activity   OT goals addressed during session: ADL's and self-care;Proper use of Adaptive equipment and DME;Strengthening/ROM      AM-PAC OT "6 Clicks" Daily Activity     Outcome Measure Help from another person eating meals?: A Little Help from another person taking care of personal grooming?: A Little Help from another person toileting, which includes using toliet, bedpan, or urinal?: A Lot Help from another person bathing (including washing, rinsing, drying)?: A Lot Help from another person to put on and taking off regular upper body clothing?: A Little Help from another person to put on and taking off regular lower body clothing?: A Lot 6 Click Score: 15   End of Session Equipment Utilized During Treatment: Gait belt Nurse Communication: Mobility status;Precautions  Activity Tolerance: Patient tolerated treatment well Patient left: in  chair;with call bell/phone within reach;with chair alarm set;with family/visitor present  OT Visit Diagnosis: Unsteadiness on feet (R26.81);Muscle weakness (generalized) (M62.81);Pain Pain - Right/Left: Right Pain - part of body: Hip                Time: 9672-8979 OT Time Calculation (min): 23 min Charges:  OT General Charges $OT Visit: 1 Visit OT Evaluation $OT Eval Moderate Complexity: 1 Mod   Brynn, OTR/L  Acute  Rehabilitation Services Pager: [phone] Office: [phone] .   Jeri Modena 02/23/2021, 1:41 PM

## 2021-02-23 NOTE — Progress Notes (Signed)
Physical Therapy Treatment Patient Details Name: Kathy Massey MRN: [id number] DOB: [birth year] Today's Date: 02/23/2021   History of Present Illness Pt is a 88 y.o. female admitted from memory care unit on 02/14/20 after unwitnessed fall sustaining R intertrochanteric hip fx. S/p R hip IMN 1/2. Course complicated by afib with RVR. PMH includes advanced dementia, afib on Eliquis, CAD, HTN, lymphoma.   PT Comments    Pt progressing with mobility. Today's session focused on transfer training, standing tolerance and weight shifts in stedy standing frame; pt requiring assist +2. Pt pleasant and cooperative, agreeable to participate. Pt remains limited by generalized weakness, decreased activity tolerance, poor balance strategies/postural reactions, pain and cognitive impairment. Continue to recommend SNF-level therapies to maximize functional mobility and independence prior to return to ALF.  HR 90s Sitting BP 106/74 Post-mobility BP 107/82    Recommendations for follow up therapy are one component of a multi-disciplinary discharge planning process, led by the attending physician.  Recommendations may be updated based on patient status, additional functional criteria and insurance authorization.  Follow Up Recommendations  Skilled nursing-short term rehab (<3 hours/day)     Assistance Recommended at Discharge Frequent or constant Supervision/Assistance  Patient can return home with the following Two people to help with walking and/or transfers;A lot of help with bathing/dressing/bathroom;Assistance with cooking/housework;Assistance with feeding;Direct supervision/assist for medications management;Assist for transportation   Equipment Recommendations  Wheelchair (measurements PT);Wheelchair cushion (measurements PT);Hospital bed (TBD on lift equipment)    Recommendations for Other Services       Precautions / Restrictions Precautions Precautions: Fall;Other (comment) Precaution Comments:  watch BP and HR Restrictions Weight Bearing Restrictions: Yes RLE Weight Bearing: Weight bearing as tolerated     Mobility  Bed Mobility Overal bed mobility: Needs Assistance Bed Mobility: Supine to Sit Rolling: Min assist   Supine to sit: Mod assist;HOB elevated     General bed mobility comments: pt progressed to eob with mod cues for use of UE to reach for rails. pt with mod (A) to scoot buttock to the eob with pad. pt pushing up with L UE. pt with ability to sit neutral with equal weight at EOB    Transfers Overall transfer level: Needs assistance Equipment used: Ambulation equipment used Transfers: Sit to/from Stand;Bed to chair/wheelchair/BSC Sit to Stand: Min assist;Mod assist;+2 physical assistance           General transfer comment: pt able to power up from bed surface with stedy. pt with mod cues for positioning and to anterior shift and anterior pelvic tilt for more upright posture; multiple sit<>stands from EOB and stedy seat with min-modA+2 for trunk elevation, heavy reliance on BUE support; modA for eccentric lowering to recliner Transfer via Lift Equipment: Stedy  Ambulation/Gait             Pre-gait activities: initiating weight shifts in stedy frame with BUE support, able to offload RLE and lift R foot; still with difficulty accepting weight onto RLE in order to lift LLE     Stairs             Wheelchair Mobility    Modified Rankin (Stroke Patients Only)       Balance Overall balance assessment: Needs assistance Sitting-balance support: No upper extremity supported Sitting balance-Leahy Scale: Fair     Standing balance support: Bilateral upper extremity supported;During functional activity;Reliant on assistive device for balance Standing balance-Leahy Scale: Poor Standing balance comment: Reliant on external assist and BUE support  Cognition Arousal/Alertness: Awake/alert Behavior During  Therapy: WFL for tasks assessed/performed;Flat affect Overall Cognitive Status: History of cognitive impairments - at baseline                                 General Comments: H/o advanced dementia. Pt pleasantly confused, agreeable and requires frequent redirection to task        Exercises General Exercises - Lower Extremity Short Arc Quad: 5 reps Long Arc Quad: AAROM;Seated;Right (flex/ext - increased pain mainly with knee flex) Other Exercises Other Exercises: tolerates sit<>stand in stedy Other Exercises: tolerates attempts at weight shift to lift R LE with diffculty . pt expressing its "heavy"    General Comments General comments (skin integrity, edema, etc.): Son present and supportive. HR 90s, BP 106/74 sitting, 107/82 post-mobility      Pertinent Vitals/Pain Pain Assessment: Faces Faces Pain Scale: Hurts little more Pain Location: R hip with mobility Pain Descriptors / Indicators: Discomfort;Grimacing;Guarding Pain Intervention(s): Monitored during session;Limited activity within patient's tolerance;Ice applied    Home Living Family/patient expects to be discharged to:: Skilled nursing facility                   Additional Comments: son present and verbalize pt often doesnt know know family names    Prior Function            PT Goals (current goals can now be found in the care plan section) Progress towards PT goals: Progressing toward goals    Frequency    Min 3X/week      PT Plan Current plan remains appropriate    Co-evaluation PT/OT/SLP Co-Evaluation/Treatment: Yes Reason for Co-Treatment: Necessary to address cognition/behavior during functional activity;For patient/therapist safety;To address functional/ADL transfers PT goals addressed during session: Mobility/safety with mobility;Balance;Strengthening/ROM OT goals addressed during session: ADL's and self-care;Proper use of Adaptive equipment and DME;Strengthening/ROM       AM-PAC PT "6 Clicks" Mobility   Outcome Measure  Help needed turning from your back to your side while in a flat bed without using bedrails?: A Lot Help needed moving from lying on your back to sitting on the side of a flat bed without using bedrails?: A Lot Help needed moving to and from a bed to a chair (including a wheelchair)?: Total Help needed standing up from a chair using your arms (e.g., wheelchair or bedside chair)?: Total Help needed to walk in hospital room?: Total Help needed climbing 3-5 steps with a railing? : Total 6 Click Score: 8    End of Session Equipment Utilized During Treatment: Gait belt Activity Tolerance: Patient tolerated treatment well Patient left: in chair;with call bell/phone within reach;with family/visitor present;with chair alarm set Nurse Communication: Mobility status;Need for lift equipment PT Visit Diagnosis: Unsteadiness on feet (R26.81);Other abnormalities of gait and mobility (R26.89);Muscle weakness (generalized) (M62.81);History of falling (Z91.81);Pain Pain - Right/Left: Right Pain - part of body: Hip     Time: 5366-4403 PT Time Calculation (min) (ACUTE ONLY): 23 min  Charges:  $Therapeutic Activity: 8-22 mins                     Mabeline Caras, PT, DPT Acute Rehabilitation Services  Pager [phone] Office [place] 02/23/2021, 1:57 PM

## 2021-02-23 NOTE — Progress Notes (Signed)
Noted more edema, at +1 pitting, than 2 days ago around right leg incision area. Most proximal incision continues to drain requiring BID dressing changes. Ortho MD notified. Will come to beside to evaluate.  Raelyn Number, RN

## 2021-02-24 DIAGNOSIS — C8583 Other specified types of non-Hodgkin lymphoma, intra-abdominal lymph nodes: Secondary | ICD-10-CM | POA: Diagnosis not present

## 2021-02-24 DIAGNOSIS — I5020 Unspecified systolic (congestive) heart failure: Secondary | ICD-10-CM | POA: Diagnosis not present

## 2021-02-24 DIAGNOSIS — I4819 Other persistent atrial fibrillation: Secondary | ICD-10-CM | POA: Diagnosis not present

## 2021-02-24 DIAGNOSIS — S72001D Fracture of unspecified part of neck of right femur, subsequent encounter for closed fracture with routine healing: Secondary | ICD-10-CM | POA: Diagnosis not present

## 2021-02-24 MED ORDER — METHOCARBAMOL 500 MG PO TABS: 500.0000 mg | ORAL_TABLET | Freq: Four times a day (QID) | ORAL | 0 refills | Status: DC | PRN

## 2021-02-24 MED ORDER — QUETIAPINE FUMARATE 25 MG PO TABS: 25.0000 mg | ORAL_TABLET | Freq: Every day | ORAL | 0 refills | Status: DC

## 2021-02-24 MED ORDER — POLYETHYLENE GLYCOL 3350 17 G PO PACK: 17.0000 g | PACK | Freq: Every day | ORAL | 0 refills | Status: DC

## 2021-02-24 MED ORDER — MIDODRINE HCL 10 MG PO TABS: 10.0000 mg | ORAL_TABLET | Freq: Three times a day (TID) | ORAL | 0 refills | Status: DC

## 2021-02-24 MED ORDER — FUROSEMIDE 40 MG PO TABS: 40.0000 mg | ORAL_TABLET | Freq: Every day | ORAL | 0 refills | Status: DC

## 2021-02-24 MED ORDER — DOCUSATE SODIUM 100 MG PO CAPS: 100.0000 mg | ORAL_CAPSULE | Freq: Two times a day (BID) | ORAL | 0 refills | Status: DC

## 2021-02-24 MED ORDER — AMIODARONE HCL 200 MG PO TABS: 200.0000 mg | ORAL_TABLET | Freq: Every day | ORAL | 0 refills | Status: DC

## 2021-02-24 MED ORDER — BUSPIRONE HCL 5 MG PO TABS: 5.0000 mg | ORAL_TABLET | Freq: Two times a day (BID) | ORAL | 0 refills | Status: DC

## 2021-02-24 NOTE — Progress Notes (Signed)
° ° °  Thank you for this referral. We will reach out to [facility] SN[facility] and gt orders for Palliative Care services at the facility. Once order received will be able to proceed.   Webb Silversmith RN [phone]

## 2021-02-24 NOTE — Progress Notes (Signed)
[facility]) Hospital Liaison note:  This is a pending outpatient-based Palliative Care patient. Will continue to follow for disposition.  Please call with any outpatient palliative questions or concerns.  Thank you, Lorelee Market, LPN [facility] Liaison [phone]

## 2021-02-24 NOTE — Progress Notes (Signed)
Physical Therapy Treatment Patient Details Name: Kathy Massey MRN: [id number] DOB: 1927-12-09 Today's Date: 02/24/2021   History of Present Illness Pt is a 88 y.o. female admitted from memory care unit on 02/14/20 after unwitnessed fall sustaining R intertrochanteric hip fx. S/p R hip IMN 1/2. Course complicated by afib with RVR. PMH includes advanced dementia, afib on Eliquis, CAD, HTN, lymphoma.   PT Comments    Pt slowly progressing with mobility; demonstrates increased confusion and nervousness this morning; follows simple commands, but requiring frequent redirection. Pt tolerated transfer to recliner and standing trials, needing maxA+2 for trunk elevation, difficulty achieving full extension due to resistance to R knee flexion; dependent for standing ADL task. Continue to recommend SNF-level therapies to maximize functional mobility and independence prior to ALF.    Recommendations for follow up therapy are one component of a multi-disciplinary discharge planning process, led by the attending physician.  Recommendations may be updated based on patient status, additional functional criteria and insurance authorization.  Follow Up Recommendations  Skilled nursing-short term rehab (<3 hours/day)     Assistance Recommended at Discharge Frequent or constant Supervision/Assistance  Patient can return home with the following Two people to help with walking and/or transfers;A lot of help with bathing/dressing/bathroom;Assistance with cooking/housework;Assistance with feeding;Direct supervision/assist for medications management;Assist for transportation   Equipment Recommendations  Wheelchair (measurements PT);Wheelchair cushion (measurements PT);Hospital bed;Other (comment) (lift equipment)    Recommendations for Other Services       Precautions / Restrictions Precautions Precautions: Fall;Other (comment) Precaution Comments: bladder/bowel incontinence Restrictions Weight Bearing  Restrictions: Yes RLE Weight Bearing: Weight bearing as tolerated     Mobility  Bed Mobility Overal bed mobility: Needs Assistance Bed Mobility: Supine to Sit     Supine to sit: Mod assist;HOB elevated     General bed mobility comments: Pt initiating BLE movement well; requiring modA for RLE management, minA for trunk elevation, heavy use of bed rail    Transfers Overall transfer level: Needs assistance Equipment used: 1 person hand held assist Transfers: Bed to chair/wheelchair/BSC;Sit to/from Stand Sit to Stand: Max assist Stand pivot transfers: Max assist         General transfer comment: Stand pivot from bed to recliner with maxA, pt able to assist with BUE support around therapist's shoulders, difficulty achieiving fully upright standing since pt not flexing R knee despite external assist; additional 2x sit<>stand from recliner for pericare/linen change, maxA with BUE support, pt difficulty shifting weight forward to achieve full trunk extension, keeping R knee extended    Ambulation/Gait                   Stairs             Wheelchair Mobility    Modified Rankin (Stroke Patients Only)       Balance Overall balance assessment: Needs assistance Sitting-balance support: No upper extremity supported Sitting balance-Leahy Scale: Fair   Postural control: Posterior lean Standing balance support: Bilateral upper extremity supported;During functional activity Standing balance-Leahy Scale: Zero Standing balance comment: MaxA and BUE support for near-upright standing, dependent for pericare                            Cognition Arousal/Alertness: Awake/alert Behavior During Therapy: Flat affect;Anxious Overall Cognitive Status: History of cognitive impairments - at baseline  General Comments: H/o advanced dementia. Pt with increased confusion this session, difficult to redirect from repetitive  questions ("Where are you taking me?" "What are they doing to me today?" "Why are you taking my leg off?" etc.); following majority of simple commands with increased cues        Exercises Other Exercises Other Exercises: Significant resistance to R knee flexion due to pain, opting to keep R knee almost fully extended when sitting edge of bed and edge of recliner despite attempts to assist RLE relaxation and knee flexion    General Comments General comments (skin integrity, edema, etc.): HR up to 120s, pt seems to have increased confusion and nervousness this AM compared to previous sessions (which were both later morning/early afternoon), may do better that time of day in the future      Pertinent Vitals/Pain Pain Assessment: Faces Faces Pain Scale: Hurts whole lot Pain Location: R hip/knee with mobility Pain Descriptors / Indicators: Discomfort;Grimacing;Guarding Pain Intervention(s): Monitored during session;Limited activity within patient's tolerance;Repositioned    Home Living                          Prior Function            PT Goals (current goals can now be found in the care plan section) Progress towards PT goals: Progressing toward goals (slowly)    Frequency    Min 3X/week      PT Plan Current plan remains appropriate    Co-evaluation              AM-PAC PT "6 Clicks" Mobility   Outcome Measure  Help needed turning from your back to your side while in a flat bed without using bedrails?: A Lot Help needed moving from lying on your back to sitting on the side of a flat bed without using bedrails?: A Lot Help needed moving to and from a bed to a chair (including a wheelchair)?: A Lot Help needed standing up from a chair using your arms (e.g., wheelchair or bedside chair)?: Total Help needed to walk in hospital room?: Total Help needed climbing 3-5 steps with a railing? : Total 6 Click Score: 9    End of Session Equipment Utilized During  Treatment: Gait belt Activity Tolerance: Patient tolerated treatment well;Patient limited by pain Patient left: in chair;with call bell/phone within reach;with chair alarm set;with nursing/sitter in room Nurse Communication: Mobility status;Need for lift equipment PT Visit Diagnosis: Unsteadiness on feet (R26.81);Other abnormalities of gait and mobility (R26.89);Muscle weakness (generalized) (M62.81);History of falling (Z91.81);Pain Pain - Right/Left: Right Pain - part of body: Hip     Time: 8828-0034 PT Time Calculation (min) (ACUTE ONLY): 33 min  Charges:  $Therapeutic Activity: 23-37 mins                     Mabeline Caras, PT, DPT Acute Rehabilitation Services  Pager [phone] Office [phone]  Kathy Massey 02/24/2021, 9:23 AM

## 2021-02-24 NOTE — Progress Notes (Signed)
Called and gave report to [place], Therapist, sports from [place]. Discharge instructions given and medications discussed. All questions answered. PTAR called by Education officer, museum, Tour manager.  [place] N Zakira Ressel

## 2021-02-24 NOTE — TOC Progression Note (Signed)
Transition of Care [facility]) - Progression Note    Patient Details  Name: Kathy Massey MRN: [id number] Date of Birth: 1927/12/24  Transition of Care [facility]) CM/SW Contact  Vinie Sill, [place] Phone Number: 02/24/2021, 12:06 PM  Clinical Narrative:     Patient will Discharge to: [facility] Discharge Date: 02/24/2021 Family Notified:daughter Transport TG:GYIR @ 1:30p  Per MD patient is ready for discharge. RN, patient, and facility notified of discharge. Discharge Summary sent to facility. RN given number for [phone] room 107. Ambulance transport requested for patient.   Clinical Social Worker signing off.  Thurmond Butts, MSW, LCSW Clinical Social Worker     Expected Discharge Plan: Skilled Nursing Facility Barriers to Discharge: Barriers Resolved  Expected Discharge Plan and Services Expected Discharge Plan: [place] arrangements for the past 2 months: [place] Expected Discharge Date: 02/24/21                                     Social Determinants of Health (SDOH) Interventions    Readmission Risk Interventions No flowsheet data found.

## 2021-02-24 NOTE — Progress Notes (Signed)
Pt transported to [facility] by [place]. Pt belongings with daughter. Pt denies pain. Pt IV taken out.

## 2021-02-24 NOTE — Progress Notes (Signed)
Mobility Specialist: Progress Note   02/24/21 1100  Mobility  Activity Transferred:  Chair to bed  Level of Assistance +2 (takes two people)  Assistive Device  (HHA)  Distance Ambulated (ft) 2 ft  Mobility Out of bed to chair with meals  Mobility Response Tolerated fair  Mobility performed by Mobility specialist;Nurse  $Mobility charge 1 Mobility   Pt assisted back to bed per RN request. Pt stood witih +2 physical assistance HHA and was able to shuffle to the bed from recliner with c/o R hip pain, no rating given. Pt back in bed with bed alarm on and call bell at her side.   [facility] Hagan Maltz Mobility Specialist Mobility Specialist 4 [place]: [phone] Mobility Specialist 2 [place]: [phone]

## 2021-02-24 NOTE — Discharge Summary (Signed)
Physician Discharge Summary  Kathy Massey RCV:[id number] DOB: [birth year] DOA: 02/13/2021  PCP: [name], MD  Admit date: 02/13/2021 Discharge date: 02/24/2021  Admitted From: Home Disposition: SNF  Recommendations for Outpatient Follow-up:  Follow up with SNF provider at her earliest convenience Outpatient follow-up with orthopedics/cardiology.  Wound care as per orthopedics recommendations. Outpatient follow-up with palliative care. Follow up in ED if symptoms worsen or new appear   Home Health: No Equipment/Devices: None  Discharge Condition: Guarded to poor CODE STATUS: DNR Diet recommendation: Heart healthy/fluid restriction of up to 1200 cc a day.  Brief/Interim Summary: 88 year old female with history of A. fib on Eliquis, CAD, HTN, dementia, history of lymphoma presented on 02/13/2021 with ground-level fall.  She was found to have right hip fracture.  She underwent right hip intertrochanteric fixation with nail on 02/14/2021.  Hospital course complicated by A. fib with RVR.  Cardiology was consulted.  Due to persistent hypotension with diltiazem as well as amiodarone, she was transferred to stepdown on 02/16/2021.  Palliative care was consulted.  During the hospitalization, her overall condition has stabilized at this moment.  Her prognosis is very poor and she will need outpatient palliative care follow-up.  She is currently on oral amiodarone and Eliquis; cardiology recommended outpatient follow-up with cardiology.  PT recommended SNF placement.  She will be discharged to SNF once bed is available.  Discharge Diagnoses:   Persistent A. fib with episodes of hypotension -She was initially tried on diltiazem but developed hypotension.  -She was also hypotensive with amiodarone requiring initiation of midodrine. -Currently rate controlled and blood pressure is stable. -Continue oral amiodarone and Eliquis.  Midodrine is being continued.  Outpatient follow-up with cardiology.    Acute on chronic systolic and diastolic heart failure -Echo showed EF of 30 to 35%. -Currently on oral Lasix as per cardiology.  Not likely to tolerate ACE inhibitor/beta-blocker due to hypotension. -Continue fluid and diet restriction.    Right hip fracture due to mechanical fall -Status post surgical intervention with nail intertrochanteric hip fixation on 02/14/2021. -Activity as per physical therapy and orthopedics. -Currently anticoagulated with Eliquis. -Wound care as per orthopedics   Goals of care Underlying dementia -Overall prognosis is guarded to poor.   -She is approaching hospice given cardiac issues that do not have a good solution, poor p.o. intake, poor mobility following her hip fracture.  Family wants to give her a trial to get better with plans for SNF with palliative following, and if she continues to decline: then to transition to hospice.  -Continue memantine  Chronically disease stage IIIa -Baseline creatinine around 1.1-1.2 -Creatinine 1.35 on 02/23/2021.  Outpatient follow-up.   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 Marin Olp   Acute postoperative blood loss anemia superimposed on anemia of chronic disease -Hemoglobin stable.  Outpatient follow-up.  Discharge Instructions  Discharge Instructions     Amb Referral to Palliative Care   Complete by: As directed    Diet - low sodium heart healthy   Complete by: As directed    Discharge wound care:   Complete by: As directed    As per orthopedics recommendations   Increase activity slowly   Complete by: As directed       Allergies as of 02/24/2021       Reactions   Shellfish Allergy Itching, Swelling   Shrimp        Medication List     STOP taking these medications  diltiazem 60 MG 12 hr capsule Commonly known as: CARDIZEM SR       TAKE these medications    acetaminophen 500 MG tablet Commonly  known as: TYLENOL Take 1,000 mg by mouth 3 (three) times daily.   amiodarone 200 MG tablet Commonly known as: PACERONE Take 1 tablet (200 mg total) by mouth daily.   atorvastatin 20 MG tablet Commonly known as: LIPITOR Take 20 mg by mouth at bedtime.   busPIRone 5 MG tablet Commonly known as: BUSPAR Take 1 tablet (5 mg total) by mouth 2 (two) times daily.   cyanocobalamin 1000 MCG tablet Take 1,000 mcg by mouth every morning.   docusate sodium 100 MG capsule Commonly known as: COLACE Take 1 capsule (100 mg total) by mouth 2 (two) times daily.   Eliquis 2.5 MG Tabs tablet Generic drug: apixaban TAKE 1 TABLET BY MOUTH TWICE A DAY What changed: how much to take   escitalopram 5 MG tablet Commonly known as: LEXAPRO Take 5 mg by mouth every morning.   fluocinonide ointment 0.05 % Commonly known as: LIDEX Apply 1 application topically See admin instructions. Order date 02/02/21 - apply topically to scalp twice daily Monday thru Friday until healed   furosemide 40 MG tablet Commonly known as: LASIX Take 1 tablet (40 mg total) by mouth daily.   memantine 10 MG tablet Commonly known as: NAMENDA Take 10 mg by mouth 2 (two) times daily. For memory   methocarbamol 500 MG tablet Commonly known as: ROBAXIN Take 1 tablet (500 mg total) by mouth every 6 (six) hours as needed for muscle spasms.   midodrine 10 MG tablet Commonly known as: PROAMATINE Take 1 tablet (10 mg total) by mouth 3 (three) times daily with meals.   polyethylene glycol 17 g packet Commonly known as: MIRALAX / GLYCOLAX Take 17 g by mouth daily.   QUEtiapine 25 MG tablet Commonly known as: SEROQUEL Take 1 tablet (25 mg total) by mouth at bedtime.               Discharge Care Instructions  (From admission, onward)           Start     Ordered   02/24/21 0000  Discharge wound care:       Comments: As per orthopedics recommendations   02/24/21 1055            Follow-up Information      Vanetta Mulders, MD Follow up.   Specialty: Orthopedic Surgery Contact information: [address] [place] [address] [phone]                Allergies  Allergen Reactions   Shellfish Allergy Itching and Swelling    Shrimp     Consultations: Orthopedic/cardiology/palliative care   Procedures/Studies: CT Head Wo Contrast  Result Date: 02/13/2021 CLINICAL DATA:  Un witnessed fall. Headache. Patient is on blood thinners. EXAM: CT HEAD WITHOUT CONTRAST CT CERVICAL SPINE WITHOUT CONTRAST TECHNIQUE: Multidetector CT imaging of the head and cervical spine was performed following the standard protocol without intravenous contrast. Multiplanar CT image reconstructions of the cervical spine were also generated. COMPARISON:  None. FINDINGS: CT HEAD FINDINGS Brain: No evidence of acute infarction, hemorrhage, hydrocephalus, extra-axial collection or mass lesion/mass effect. Ventricular and sulcal enlargement consistent with moderate atrophy. Patchy areas of white matter hypoattenuation are also noted consistent with mild chronic microvascular ischemic change. Vascular: No hyperdense vessel or unexpected calcification. Skull: Normal. Negative for fracture or focal lesion. Sinuses/Orbits: Globes and  orbits are unremarkable. Sinuses are clear. Other: None. CT CERVICAL SPINE FINDINGS Alignment: Normal. Skull base and vertebrae: No acute fracture. No primary bone lesion or focal pathologic process. Soft tissues and spinal canal: No prevertebral fluid or swelling. No visible canal hematoma. Disc levels: Moderate loss of disc height at C3-C4 and C4-C5 and C6-C7. Mild disc bulging and endplate spurring noted at these levels. No convincing disc herniation. Upper chest: Multiple bone fragments adjacent to the distal left clavicle suspected to be an old fracture, but possibly acute/recent. Other: None. IMPRESSION: HEAD CT 1. No acute intracranial abnormalities. CERVICAL CT 1. No spine  fracture or malalignment. 2. Distal left clavicle fractures, incompletely imaged and possibly chronic. Clinical correlation recommended. Electronically Signed   By: Lajean Manes M.D.   On: 02/13/2021 09:03   CT Cervical Spine Wo Contrast  Result Date: 02/13/2021 CLINICAL DATA:  Un witnessed fall. Headache. Patient is on blood thinners. EXAM: CT HEAD WITHOUT CONTRAST CT CERVICAL SPINE WITHOUT CONTRAST TECHNIQUE: Multidetector CT imaging of the head and cervical spine was performed following the standard protocol without intravenous contrast. Multiplanar CT image reconstructions of the cervical spine were also generated. COMPARISON:  None. FINDINGS: CT HEAD FINDINGS Brain: No evidence of acute infarction, hemorrhage, hydrocephalus, extra-axial collection or mass lesion/mass effect. Ventricular and sulcal enlargement consistent with moderate atrophy. Patchy areas of white matter hypoattenuation are also noted consistent with mild chronic microvascular ischemic change. Vascular: No hyperdense vessel or unexpected calcification. Skull: Normal. Negative for fracture or focal lesion. Sinuses/Orbits: Globes and orbits are unremarkable. Sinuses are clear. Other: None. CT CERVICAL SPINE FINDINGS Alignment: Normal. Skull base and vertebrae: No acute fracture. No primary bone lesion or focal pathologic process. Soft tissues and spinal canal: No prevertebral fluid or swelling. No visible canal hematoma. Disc levels: Moderate loss of disc height at C3-C4 and C4-C5 and C6-C7. Mild disc bulging and endplate spurring noted at these levels. No convincing disc herniation. Upper chest: Multiple bone fragments adjacent to the distal left clavicle suspected to be an old fracture, but possibly acute/recent. Other: None. IMPRESSION: HEAD CT 1. No acute intracranial abnormalities. CERVICAL CT 1. No spine fracture or malalignment. 2. Distal left clavicle fractures, incompletely imaged and possibly chronic. Clinical correlation  recommended. Electronically Signed   By: Lajean Manes M.D.   On: 02/13/2021 09:03   DG CHEST PORT 1 VIEW  Result Date: 02/21/2021 CLINICAL DATA:  88 year old female with history of congestive heart failure. Confusion. EXAM: PORTABLE CHEST 1 VIEW COMPARISON:  Chest x-ray 02/16/2021. FINDINGS: Lung volumes are normal. No consolidative airspace disease. No pleural effusions. No pneumothorax. No evidence of pulmonary edema. Moderate cardiomegaly. Upper mediastinal contours are within normal limits. Atherosclerotic calcifications in the thoracic aorta. IMPRESSION: 1. No radiographic evidence of acute cardiopulmonary disease. 2. Moderate cardiomegaly. 3. Aortic atherosclerosis. Electronically Signed   By: Vinnie Langton M.D.   On: 02/21/2021 09:07   DG CHEST PORT 1 VIEW  Result Date: 02/16/2021 CLINICAL DATA:  Shortness of breath. EXAM: PORTABLE CHEST 1 VIEW COMPARISON:  CT chest 12/04/2017. FINDINGS: Prominent cardiomegaly. No pulmonary venous congestion. Low lung volumes with mild bibasilar atelectasis. Tiny bilateral pleural effusions cannot be excluded. No pneumothorax. Mild thoracic spine scoliosis. Distal left clavicle fracture, age undetermined. IMPRESSION: 1.  Prominent cardiomegaly, no pulmonary venous congestion. 2. Low lung volumes with mild bibasilar atelectasis. Tiny bilateral pleural effusions cannot be excluded. 3.  Distal left clavicle fracture, age undetermined. Electronically Signed   By: Marcello Moores  Register M.D.   On: 02/16/2021 06:30  DG Shoulder Left  Result Date: 02/13/2021 CLINICAL DATA:  Status post fall with left shoulder pain. EXAM: LEFT SHOULDER - 2+ VIEW COMPARISON:  December 29, 2020 FINDINGS: Chronic change of the left clavicle is noted. There is no acute fracture or dislocation. The visualized lung field is normal. IMPRESSION: No acute fracture or dislocation. Electronically Signed   By: Abelardo Diesel M.D.   On: 02/13/2021 08:29   DG C-Arm 1-60 Min-No Report  Result Date:  02/14/2021 CLINICAL DATA:  Intramedullary rod fixation of right femur fracture. EXAM: RIGHT FEMUR 2 VIEWS; DG C-ARM 1-60 MIN-NO REPORT Radiation exposure index: 7.05 mGy. COMPARISON:  February 13, 2021. FINDINGS: Six intraoperative fluoroscopic images were obtained of the right femur. These images demonstrate surgical internal fixation of intertrochanteric fracture involving proximal right femur. Intramedullary rod fixation of right femur is noted as well. IMPRESSION: Fluoroscopic guidance provided during surgical internal fixation of proximal right femoral intertrochanteric fracture. Electronically Signed   By: Marijo Conception M.D.   On: 02/14/2021 12:26   DG C-Arm 1-60 Min-No Report  Result Date: 02/14/2021 CLINICAL DATA:  Intramedullary rod fixation of right femur fracture. EXAM: RIGHT FEMUR 2 VIEWS; DG C-ARM 1-60 MIN-NO REPORT Radiation exposure index: 7.05 mGy. COMPARISON:  February 13, 2021. FINDINGS: Six intraoperative fluoroscopic images were obtained of the right femur. These images demonstrate surgical internal fixation of intertrochanteric fracture involving proximal right femur. Intramedullary rod fixation of right femur is noted as well. IMPRESSION: Fluoroscopic guidance provided during surgical internal fixation of proximal right femoral intertrochanteric fracture. Electronically Signed   By: Marijo Conception M.D.   On: 02/14/2021 12:26   ECHOCARDIOGRAM COMPLETE  Result Date: 02/16/2021    ECHOCARDIOGRAM REPORT   Patient Name:   Kathy Massey Date of Exam: 02/16/2021 Medical Rec #:  [id number]       Height:       59.0 in Accession #:    [id number]      Weight:       119.3 lb Date of Birth:  1927-07-08       BSA:          1.481 m Patient Age:    42 years        BP:           93/67 mmHg Patient Gender: F               HR:           126 bpm. Exam Location:  Inpatient Procedure: 2D Echo, Cardiac Doppler, Color Doppler and Intracardiac            Opacification Agent Indications:    Afib  History:         Patient has no prior history of Echocardiogram examinations.  Sonographer:    Jyl Heinz Referring Phys: [id number] [place]  1. Left ventricular ejection fraction, by estimation, is 30 to 35%. The left ventricle has moderately decreased function. The left ventricle demonstrates global hypokinesis. Diastolic function indeterminant due to AFib.  2. Right ventricular systolic function is normal. The right ventricular size is mildly enlarged. There is mildly elevated pulmonary artery systolic pressure. The estimated right ventricular systolic pressure is 51.7 mmHg.  3. Left atrial size was severely dilated.  4. Right atrial size was severely dilated.  5. The mitral valve is grossly normal. Moderate mitral valve regurgitation.  6. Tricuspid valve regurgitation is severe.  7. The aortic valve is tricuspid. There is mild calcification of the aortic valve.  There is mild thickening of the aortic valve. Aortic valve regurgitation is mild. Aortic valve sclerosis/calcification is present, without any evidence of aortic stenosis.  8. The inferior vena cava is dilated in size with <50% respiratory variability, suggesting right atrial pressure of 15 mmHg. Comparison(s): No prior Echocardiogram. FINDINGS  Left Ventricle: Left ventricular ejection fraction, by estimation, is 30 to 35%. The left ventricle has moderately decreased function. The left ventricle demonstrates global hypokinesis. The left ventricular internal cavity size was normal in size. There is no left ventricular hypertrophy. Diastolic function indeterminant due to AFib. Right Ventricle: The right ventricular size is mildly enlarged. No increase in right ventricular wall thickness. Right ventricular systolic function is normal. There is mildly elevated pulmonary artery systolic pressure. The tricuspid regurgitant velocity is 2.70 m/s, and with an assumed right atrial pressure of 15 mmHg, the estimated right ventricular systolic pressure is  03.4 mmHg. Left Atrium: Left atrial size was severely dilated. Right Atrium: Right atrial size was severely dilated. Pericardium: There is no evidence of pericardial effusion. Mitral Valve: The mitral valve is grossly normal. There is mild thickening of the mitral valve leaflet(s). There is mild calcification of the mitral valve leaflet(s). Mild mitral annular calcification. Moderate mitral valve regurgitation. Tricuspid Valve: The tricuspid valve is normal in structure. Tricuspid valve regurgitation is severe. Aortic Valve: The aortic valve is tricuspid. There is mild calcification of the aortic valve. There is mild thickening of the aortic valve. Aortic valve regurgitation is mild. Aortic regurgitation PHT measures 549 msec. Aortic valve sclerosis/calcification is present, without any evidence of aortic stenosis. Aortic valve peak gradient measures 6.8 mmHg. Pulmonic Valve: The pulmonic valve was normal in structure. Pulmonic valve regurgitation is mild. Aorta: The aortic root and ascending aorta are structurally normal, with no evidence of dilitation. Venous: The inferior vena cava is dilated in size with less than 50% respiratory variability, suggesting right atrial pressure of 15 mmHg. IAS/Shunts: The atrial septum is grossly normal.  LEFT VENTRICLE PLAX 2D LVIDd:         4.40 cm     Diastology LVIDs:         3.30 cm     LV e' medial:    7.94 cm/s LV PW:         0.80 cm     LV E/e' medial:  12.2 LV IVS:        0.70 cm     LV e' lateral:   11.40 cm/s LVOT diam:     2.00 cm     LV E/e' lateral: 8.5 LV SV:         36 LV SV Index:   24 LVOT Area:     3.14 cm  LV Volumes (MOD) LV vol d, MOD A2C: 76.0 ml LV vol d, MOD A4C: 82.3 ml LV vol s, MOD A2C: 38.3 ml LV vol s, MOD A4C: 43.1 ml LV SV MOD A2C:     37.7 ml LV SV MOD A4C:     82.3 ml LV SV MOD BP:      37.2 ml RIGHT VENTRICLE             IVC RV Basal diam:  4.50 cm     IVC diam: 1.50 cm RV Mid diam:    4.30 cm RV S prime:     13.10 cm/s TAPSE (M-mode): 2.0 cm  LEFT ATRIUM             Index  RIGHT ATRIUM           Index LA diam:        4.10 cm 2.77 cm/m   RA Area:     32.20 cm LA Vol (A2C):   77.8 ml 52.55 ml/m  RA Volume:   121.00 ml 81.72 ml/m LA Vol (A4C):   83.6 ml 56.46 ml/m LA Biplane Vol: 89.6 ml 60.52 ml/m  AORTIC VALVE AV Area (Vmax): 2.02 cm AV Vmax:        130.50 cm/s AV Peak Grad:   6.8 mmHg LVOT Vmax:      83.90 cm/s LVOT Vmean:     57.200 cm/s LVOT VTI:       0.114 m AI PHT:         549 msec  AORTA Ao Root diam: 2.40 cm Ao Asc diam:  2.60 cm MITRAL VALVE               TRICUSPID VALVE MV Area (PHT): 4.86 cm    TR Peak grad:   29.2 mmHg MV Decel Time: 156 msec    TR Vmax:        270.00 cm/s MR Peak grad: 70.9 mmHg MR Mean grad: 49.5 mmHg    SHUNTS MR Vmax:      421.00 cm/s  Systemic VTI:  0.11 m MR Vmean:     338.5 cm/s   Systemic Diam: 2.00 cm MV E velocity: 96.90 cm/s Gwyndolyn Kaufman MD Electronically signed by Gwyndolyn Kaufman MD Signature Date/Time: 02/16/2021/1:44:26 PM    Final    DG Hip Unilat W or Wo Pelvis 2-3 Views Left  Result Date: 02/13/2021 CLINICAL DATA:  Status post fall with right hip pain. EXAM: DG HIP (WITH OR WITHOUT PELVIS) 2-3V LEFT COMPARISON:  None. FINDINGS: There is no evidence of hip fracture or dislocation. Degenerative joint changes of left hip are noted. IMPRESSION: No acute fracture or dislocation of left hip. Electronically Signed   By: Abelardo Diesel M.D.   On: 02/13/2021 08:28   DG Hip Unilat W or Wo Pelvis 2-3 Views Right  Result Date: 02/13/2021 CLINICAL DATA:  Status post fall with right hip pain. EXAM: DG HIP (WITH OR WITHOUT PELVIS) 2-3V RIGHT COMPARISON:  None. FINDINGS: Displaced fracture of the right intertrochanteric region extending to the proximal right femoral shaft is identified. Degenerative joint changes of the left hip and. IMPRESSION: Displaced fracture of right intertrochanteric region extending to the proximal right femoral shaft. Electronically Signed   By: Abelardo Diesel M.D.   On:  02/13/2021 08:27   DG FEMUR, MIN 2 VIEWS RIGHT  Result Date: 02/14/2021 CLINICAL DATA:  Intramedullary rod fixation of right femur fracture. EXAM: RIGHT FEMUR 2 VIEWS; DG C-ARM 1-60 MIN-NO REPORT Radiation exposure index: 7.05 mGy. COMPARISON:  February 13, 2021. FINDINGS: Six intraoperative fluoroscopic images were obtained of the right femur. These images demonstrate surgical internal fixation of intertrochanteric fracture involving proximal right femur. Intramedullary rod fixation of right femur is noted as well. IMPRESSION: Fluoroscopic guidance provided during surgical internal fixation of proximal right femoral intertrochanteric fracture. Electronically Signed   By: Marijo Conception M.D.   On: 02/14/2021 12:26      Subjective: Patient seen and examined at bedside sitting on chair.  Poor historian; sleepy, confused when wakes up.  No overnight agitation, fever or vomiting reported.  Discharge Exam: Vitals:   02/24/21 0401 02/24/21 0819  BP: 128/88 (!) 145/94  Pulse: 99 78  Resp:  17  Temp: 97.9 F (  36.6 C) 97.7 F (36.5 C)  SpO2: 94% 97%    General: Pt is sleepy, wakes up slightly, confused.  Elderly female sitting on chair.  On room air currently.  No distress. Cardiovascular: rate controlled, S1/S2 + Respiratory: bilateral decreased breath sounds at bases with some scattered crackles Abdominal: Soft, NT, ND, bowel sounds + Extremities: Trace lower extremity edema; no cyanosis    The results of significant diagnostics from this hospitalization (including imaging, microbiology, ancillary and laboratory) are listed below for reference.     Microbiology: Recent Results (from the past 240 hour(s))  SARS CORONAVIRUS 2 (TAT 6-24 HRS) Nasopharyngeal Nasopharyngeal Swab     Status: None   Collection Time: 02/22/21  6:07 PM   Specimen: Nasopharyngeal Swab  Result Value Ref Range Status   SARS Coronavirus 2 NEGATIVE NEGATIVE Final    Comment: (NOTE) SARS-CoV-2 target nucleic acids  are NOT DETECTED.  The SARS-CoV-2 RNA is generally detectable in upper and lower respiratory specimens during the acute phase of infection. Negative results do not preclude SARS-CoV-2 infection, do not rule out co-infections with other pathogens, and should not be used as the sole basis for treatment or other patient management decisions. Negative results must be combined with clinical observations, patient history, and epidemiological information. The expected result is Negative.  Fact Sheet for Patients: SugarRoll.be  Fact Sheet for Healthcare Providers: https://www.woods-mathews.com/  This test is not yet approved or cleared by the [place] FDA and  has been authorized for detection and/or diagnosis of SARS-CoV-2 by FDA under an Emergency Use Authorization (EUA). This EUA will remain  in effect (meaning this test can be used) for the duration of the COVID-19 declaration under Se ction 564(b)(1) of the Act, 21 U.S.C. section 360bbb-3(b)(1), unless the authorization is terminated or revoked sooner.  Performed at [facility], [place] [address], [place], [place] [address]      Labs: BNP (last 3 results) Recent Labs    02/15/21 1554 02/21/21 0907  BNP 227.9* 008.6*   Basic Metabolic Panel: Recent Labs  Lab 02/19/21 0135 02/19/21 1040 02/20/21 0135 02/21/21 0149 02/22/21 0307 02/23/21 0444  NA 137  --  134* 134* 135 136  K 3.2*  --  4.4 4.5 3.8 4.3  CL 101  --  100 100 99 102  CO2 26  --  25 25 25 26   GLUCOSE 118*  --  96 87 177* 83  BUN 26*  --  24* 22 22 22   CREATININE 1.16*  --  1.13* 1.06* 1.26* 1.35*  CALCIUM 8.2*  --  8.2* 8.5* 8.0* 8.0*  MG  --  1.9  --   --   --   --    Liver Function Tests: Recent Labs  Lab 02/18/21 0418 02/19/21 0135 02/20/21 0135 02/22/21 0307  AST 30 29 50* 36  ALT 5 9 13 12   ALKPHOS 93 119 184* 226*  BILITOT 1.4* 1.6* 1.4* 1.1  PROT 4.7* 5.0* 4.9* 4.7*  ALBUMIN 2.3* 2.3*  2.2* 2.2*   No results for input(s): LIPASE, AMYLASE in the last 168 hours. No results for input(s): AMMONIA in the last 168 hours. CBC: Recent Labs  Lab 02/18/21 0418 02/19/21 0135 02/20/21 0135 02/22/21 0307 02/23/21 0444  WBC 5.2 6.1 6.8 7.8 6.3  HGB 9.8* 9.7* 9.6* 9.3* 9.9*  HCT 31.3* 29.9* 30.1* 29.3* 31.6*  MCV 98.7 96.1 96.8 96.4 96.0  PLT 158 195 201 269 337   Cardiac Enzymes: No results for input(s): CKTOTAL,  CKMB, CKMBINDEX, TROPONINI in the last 168 hours. BNP: Invalid input(s): POCBNP CBG: No results for input(s): GLUCAP in the last 168 hours. D-Dimer No results for input(s): DDIMER in the last 72 hours. Hgb A1c No results for input(s): HGBA1C in the last 72 hours. Lipid Profile No results for input(s): CHOL, HDL, LDLCALC, TRIG, CHOLHDL, LDLDIRECT in the last 72 hours. Thyroid function studies No results for input(s): TSH, T4TOTAL, T3FREE, THYROIDAB in the last 72 hours.  Invalid input(s): FREET3 Anemia work up No results for input(s): VITAMINB12, FOLATE, FERRITIN, TIBC, IRON, RETICCTPCT in the last 72 hours. Urinalysis    Component Value Date/Time   COLORURINE YELLOW 10/22/2019 1537   APPEARANCEUR CLEAR 10/22/2019 1537   LABSPEC 1.025 10/22/2019 1537   PHURINE 5.5 10/22/2019 1537   GLUCOSEU NEGATIVE 10/22/2019 1537   HGBUR TRACE (A) 10/22/2019 1537   BILIRUBINUR NEGATIVE 10/22/2019 1537   KETONESUR NEGATIVE 10/22/2019 1537   PROTEINUR NEGATIVE 10/22/2019 1537   NITRITE NEGATIVE 10/22/2019 1537   LEUKOCYTESUR SMALL (A) 10/22/2019 1537   Sepsis Labs Invalid input(s): PROCALCITONIN,  WBC,  LACTICIDVEN Microbiology Recent Results (from the past 240 hour(s))  SARS CORONAVIRUS 2 (TAT 6-24 HRS) Nasopharyngeal Nasopharyngeal Swab     Status: None   Collection Time: 02/22/21  6:07 PM   Specimen: Nasopharyngeal Swab  Result Value Ref Range Status   SARS Coronavirus 2 NEGATIVE NEGATIVE Final    Comment: (NOTE) SARS-CoV-2 target nucleic acids are NOT  DETECTED.  The SARS-CoV-2 RNA is generally detectable in upper and lower respiratory specimens during the acute phase of infection. Negative results do not preclude SARS-CoV-2 infection, do not rule out co-infections with other pathogens, and should not be used as the sole basis for treatment or other patient management decisions. Negative results must be combined with clinical observations, patient history, and epidemiological information. The expected result is Negative.  Fact Sheet for Patients: SugarRoll.be  Fact Sheet for Healthcare Providers: https://www.woods-mathews.com/  This test is not yet approved or cleared by the [place] FDA and  has been authorized for detection and/or diagnosis of SARS-CoV-2 by FDA under an Emergency Use Authorization (EUA). This EUA will remain  in effect (meaning this test can be used) for the duration of the COVID-19 declaration under Se ction 564(b)(1) of the Act, 21 U.S.C. section 360bbb-3(b)(1), unless the authorization is terminated or revoked sooner.  Performed at [facility], [place] [address] [place], [place] [address]      Time coordinating discharge: 35 minutes  SIGNED:   Aline August, MD  [facility] 02/24/2021, 10:55 AM

## 2021-02-24 NOTE — Care Management Important Message (Signed)
Important Message  Patient Details  Name: Kathy Massey MRN: [id number] Date of Birth: [name] 02, 1928   Medicare Important Message Given:  Yes     Shelda Altes 02/24/2021, 7:49 AM

## 2021-03-08 ENCOUNTER — Other Ambulatory Visit (HOSPITAL_BASED_OUTPATIENT_CLINIC_OR_DEPARTMENT_OTHER): Payer: Self-pay | Admitting: Orthopaedic Surgery

## 2021-03-08 DIAGNOSIS — S72141A Displaced intertrochanteric fracture of right femur, initial encounter for closed fracture: Secondary | ICD-10-CM

## 2021-03-09 ENCOUNTER — Ambulatory Visit (INDEPENDENT_AMBULATORY_CARE_PROVIDER_SITE_OTHER): Payer: Medicare PPO | Admitting: Orthopaedic Surgery

## 2021-03-09 ENCOUNTER — Other Ambulatory Visit: Payer: Self-pay

## 2021-03-09 ENCOUNTER — Ambulatory Visit (HOSPITAL_BASED_OUTPATIENT_CLINIC_OR_DEPARTMENT_OTHER)
Admission: RE | Admit: 2021-03-09 | Discharge: 2021-03-09 | Disposition: A | Discharge status: Home / Self Care | Payer: Medicare PPO | Source: Ambulatory Visit | Attending: Orthopaedic Surgery | Admitting: Orthopaedic Surgery

## 2021-03-09 DIAGNOSIS — S72141A Displaced intertrochanteric fracture of right femur, initial encounter for closed fracture: Secondary | ICD-10-CM | POA: Diagnosis present

## 2021-03-09 NOTE — Progress Notes (Signed)
Post Operative Evaluation    Procedure/Date of Surgery: Right hip cephalomedullary nailing February 14, 2021  Interval History:   Presents today for follow-up of the above procedure to next weeks prior.  Overall she is doing very well.  She has been able to take up to 13 steps now.  She is working with physical therapy daily.  She has been compliant with anticoagulation per her nursing home.   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 EF > 55% by 06/16/15 echo.   ELBOW BURSA SURGERY Right 2016   EYE SURGERY     cataracts removed ,both eyes, /w IOL   INTRAMEDULLARY (IM) NAIL INTERTROCHANTERIC Right 02/14/2021   Procedure: INTRAMEDULLARY (IM) NAIL INTERTROCHANTRIC;  Surgeon: Vanetta Mulders, MD;  Location: [facility];  Service: Orthopedics;  Laterality: Right;   LYMPH NODE BIOPSY Left 01/14/2018   Procedure: EXCISION DEEP LEFT INGUINAL LYMPH NODE BIOPSY ERAS PATHWAY;  Surgeon: Fanny Skates, MD;  Location: [facility];  Service: General;  Laterality: Left;   TONSILLECTOMY     Social History   Socioeconomic History   Marital status: Widowed    Spouse name: Not on file   Number of children: Not on file   Years of education: Not on file   Highest education level: Not on file  Occupational History   Not on file  Tobacco Use   Smoking status: Never   Smokeless tobacco: Never   Tobacco comments:    never used tobacco  Vaping Use   Vaping Use: Never used  Substance and Sexual Activity    Alcohol use: Yes    Alcohol/week: 1.0 standard drink    Types: 1 Glasses of wine per week   Drug use: No   Sexual activity: Not on file  Other Topics Concern   Not on file  Social History Narrative   Not on file   Social Determinants of Health   Financial Resource Strain: Not on file  Food Insecurity: Not on file  Transportation Needs: Not on file  Physical Activity: Not on file  Stress: Not on file  Social Connections: Not on file   Family History  Problem Relation Age of Onset   Heart disease Mother    Depression Mother    Heart failure Mother    Heart disease Father    Alcohol abuse Father    Heart attack Father    Depression Father    Multiple myeloma Maternal Grandmother    Alcohol abuse Maternal Grandfather    Breast cancer Paternal Grandmother    Breast cancer Daughter    Allergies  Allergen Reactions   Shellfish Allergy Itching and Swelling    Shrimp    Current Outpatient Medications  Medication Sig Dispense Refill   acetaminophen (TYLENOL) 500 MG tablet Take 1,000 mg by mouth 3 (three) times daily.  amiodarone (PACERONE) 200 MG tablet Take 1 tablet (200 mg total) by mouth daily. 30 tablet 0   apixaban (ELIQUIS) 2.5 MG TABS tablet TAKE 1 TABLET BY MOUTH TWICE A DAY (Patient taking differently: Take 2.5 mg by mouth 2 (two) times daily.) 60 tablet 5   atorvastatin (LIPITOR) 20 MG tablet Take 20 mg by mouth at bedtime.     busPIRone (BUSPAR) 5 MG tablet Take 1 tablet (5 mg total) by mouth 2 (two) times daily. 30 tablet 0   cyanocobalamin 1000 MCG tablet Take 1,000 mcg by mouth every morning.     docusate sodium (COLACE) 100 MG capsule Take 1 capsule (100 mg total) by mouth 2 (two) times daily. 30 capsule 0   escitalopram (LEXAPRO) 5 MG tablet Take 5 mg by mouth every morning.     fluocinonide ointment (LIDEX) 6.28 % Apply 1 application topically See admin instructions. Order date 02/02/21 - apply topically to scalp twice daily Monday thru Friday until  healed     furosemide (LASIX) 40 MG tablet Take 1 tablet (40 mg total) by mouth daily. 30 tablet 0   memantine (NAMENDA) 10 MG tablet Take 10 mg by mouth 2 (two) times daily. For memory     methocarbamol (ROBAXIN) 500 MG tablet Take 1 tablet (500 mg total) by mouth every 6 (six) hours as needed for muscle spasms. 30 tablet 0   midodrine (PROAMATINE) 10 MG tablet Take 1 tablet (10 mg total) by mouth 3 (three) times daily with meals. 30 tablet 0   polyethylene glycol (MIRALAX / GLYCOLAX) 17 g packet Take 17 g by mouth daily. 14 each 0   QUEtiapine (SEROQUEL) 25 MG tablet Take 1 tablet (25 mg total) by mouth at bedtime. 10 tablet 0   No current facility-administered medications for this visit.   No results found.  Review of Systems:   A ROS was performed including pertinent positives and negatives as documented in the HPI.   Musculoskeletal Exam:    There were no vitals taken for this visit.  Right hip incision is well-healed.  There is some redness around the staple line proximally.  She is able to extend and flex at the right knee.  Sensation is intact in terms of the right foot.  2+ dorsalis pedis pulse  Imaging:    X-rays 2 views of right femur: Status post cephalomedullary nailing with some interval callus formation  I personally reviewed and interpreted the radiographs.   Assessment:   88 year old female status post right hip cephalomedullary nailing.  I would like her to continue to be activity as tolerated and she will follow-up in 1 month  Plan :    -Return to clinic in 1 month for reassessment     I personally saw and evaluated the patient, and participated in the management and treatment plan.  Vanetta Mulders, MD Attending Physician, Orthopedic Surgery  This document was dictated using Dragon voice recognition software. A reasonable attempt at proof reading has been made to minimize errors.

## 2021-03-16 ENCOUNTER — Ambulatory Visit: Payer: Medicare PPO | Admitting: Student

## 2021-03-17 ENCOUNTER — Other Ambulatory Visit: Payer: Self-pay

## 2021-03-17 ENCOUNTER — Non-Acute Institutional Stay: Payer: Self-pay

## 2021-03-17 DIAGNOSIS — Z515 Encounter for palliative care: Secondary | ICD-10-CM

## 2021-03-22 ENCOUNTER — Ambulatory Visit: Payer: Medicare PPO | Admitting: Podiatry

## 2021-03-30 ENCOUNTER — Other Ambulatory Visit: Payer: Self-pay

## 2021-03-30 ENCOUNTER — Non-Acute Institutional Stay: Payer: Self-pay | Admitting: Internal Medicine

## 2021-03-30 DIAGNOSIS — I5022 Chronic systolic (congestive) heart failure: Secondary | ICD-10-CM

## 2021-03-30 DIAGNOSIS — F0280 Dementia in other diseases classified elsewhere without behavioral disturbance: Secondary | ICD-10-CM

## 2021-03-30 DIAGNOSIS — F0150 Vascular dementia without behavioral disturbance: Secondary | ICD-10-CM

## 2021-03-30 DIAGNOSIS — E43 Unspecified severe protein-calorie malnutrition: Secondary | ICD-10-CM

## 2021-03-30 DIAGNOSIS — Z515 Encounter for palliative care: Secondary | ICD-10-CM

## 2021-03-30 DIAGNOSIS — N1831 Chronic kidney disease, stage 3a: Secondary | ICD-10-CM

## 2021-03-30 DIAGNOSIS — S72141A Displaced intertrochanteric fracture of right femur, initial encounter for closed fracture: Secondary | ICD-10-CM

## 2021-03-30 NOTE — Progress Notes (Signed)
[facility] Palliative Care Consult Note Telephone: [phone]  Fax: [phone]   Date of encounter: 03/31/21 5:52 AM PATIENT NAME: Kathy Massey [place] [address]   [id number] (home)  DOB: [birth year] MRN: [id number] PRIMARY CARE PROVIDER:    [name], MD,  [place] Kathy Massey [id number] [phone]  REFERRING PROVIDER:   [name], MD [address] [place],  [place] [address] [phone]  RESPONSIBLE PARTY:    Contact Information     Name Relation Home Work Mobile   Kathy Massey Daughter [phone]  [phone]   Kathy Massey, Tacker [phone]  [phone]        I met face to face with patient and family in [facility] facility. Palliative Care was asked to follow this patient by consultation request of  Kathy Glazier, MD to address advance care planning and complex medical decision making. This is the initial visit.                                     ASSESSMENT AND PLAN / RECOMMENDATIONS:   Advance Care Planning/Goals of Care: Goals include to maximize quality of life and symptom management. Patient/health care surrogate gave his/her permission to discuss.Our advance care planning conversation included a discussion about:    The value and importance of advance care planning  Experiences with loved ones who have been seriously ill or have died  Exploration of personal, cultural or spiritual beliefs that might influence medical decisions  Exploration of goals of care in the event of a sudden injury or illness  Identification  of a healthcare agent  Review and updating or creation of an  advance directive document . Decision not to resuscitate or to de-escalate disease focused treatments due to poor prognosis. CODE STATUS  DNR  Symptom Management/Plan: 1. Mixed Alzheimer's and vascular dementia (Kathy Massey) -appears moderate, very pleasant, joking, and eager to be independent, but  at risk for recurrent falls due to poor safety awareness -appears she'd benefit from long-term memory care support if plan is to stay in a facility long-term--need to call [name] back to discuss  2. Fracture, intertrochanteric, right femur, closed, initial encounter (Kathy Massey) -recovering gradually, is to be using walker, but forgets, has minimal pain mid-thigh occasionally (had just been up trying to use the bathroom unassisted and staff discovered her and were helping her)--continues on therapy  3. Protein-calorie malnutrition, severe -considering this, we may need to be more liberal with below for her to gain true body weight not fluid, has been eating ok per staff  4. Chronic systolic congestive heart failure (HCC) -ongoing, on daily wts, stable here, 1200cc fluid restriction and cardiac heart healthy diet  5. Stage 3a chronic kidney disease (CKD) (HCC) -monitor carefully and dose adjust medications accordingly, had worsened during hospitalization, encourage adequate hydration but without pushing into volume overload  6. Palliative care by specialist -left voicemail for pt's daughter to discuss goals/long-term plans for pt -does have DNR/DNI and has living will on file   Follow up Palliative Care Visit: Palliative care will continue to follow for complex medical decision making, advance care planning, and clarification of goals. Return 2-3 weeks or prn.  This visit was coded based on medical decision making (MDM).  PPS: 40%  HOSPICE ELIGIBILITY/DIAGNOSIS: TBD  Chief Complaint: new palliative care consult  HISTORY OF PRESENT ILLNESS:  Kathy Massey is a  88 y.o. year old female  with dementia, afib on eliquis, cad, htn, h/o lymphoma, chf with reduced ef 30-35%, and frailty.  She is here for rehab s/p fall 02/13/21 with right femoral fx for which she underwent right hip nailing.  She has a f/u on 2/22 with Kathy Massey.    When seen, she reported occasional pain right mid-thigh, but incisions  were well healed.  She was trying to do things on her own that were not yet safe.  She is to be using her walker at all times.  There were several signs in her room as reminders.  She talks about wanting to do things on her own.  She was pleasant and joking.  She sleeps well, has been eating better, and continues to receive skilled care.    Labs reviewed with most recent creatinine 1.09 on 1/24, gfr 48, hgb 12.3; 1/10 albumin was 2.2  History obtained from review of EMR, discussion with primary team, and interview with family, facility staff/caregiver and/or [name].  I reviewed available labs, medications, imaging, studies and related documents from the EMR.  Records reviewed and summarized above.   ROS  General: NAD EYES: denies vision changes ENMT: denies dysphagia Cardiovascular: denies chest pain, denies DOE Pulmonary: denies cough, denies increased SOB Abdomen: endorses good appetite, denies constipation, endorses some incontinence of bowel GU: denies dysuria, endorses some incontinence of urine MSK:  has increased weakness,  no falls reported here recently Skin: denies rashes or wounds--incisions dry and clean Neurological: reports pain mid thigh, denies insomnia Psych: Endorses positive mood Heme/lymph/immuno: denies bruises, abnormal bleeding  Physical Exam: Current and past weights:  119 lbs on 02/16/21 Constitutional: NAD General: frail appearing, thin EYES: anicteric sclera, lids intact, no discharge  ENMT: intact hearing, oral mucous membranes moist, dentition intact CV: irreg irreg, no LE edema Pulmonary: LCTA, no increased work of breathing, no cough, room air Abdomen: intake 75%, normo-active BS + 4 quadrants, soft and non tender, no ascites GU: deferred MSK: has sarcopenia, moves all extremities, ambulatory--to use walker but goes without against all staff advice Skin: warm and dry, no rashes or wounds on visible skin--three small incision sites well healed  Neuro:   has generalized weakness,  has cognitive impairment Psych: non-anxious affect, A and O to person and knows not home Hem/lymph/immuno: no widespread bruising  CURRENT PROBLEM LIST:  Patient Active Problem List   Diagnosis Date Noted   Protein-calorie malnutrition, severe 02/22/2021   CHF (congestive heart failure) (HCC)    Fracture, intertrochanteric, right femur, closed, initial encounter ([place])    Hip fracture ([place]) 02/13/2021   DNR (do not resuscitate) 02/13/2021   Hyperglycemia 02/13/2021   Blood clotting disorder ([place]) 12/14/2020   Mixed Alzheimer's and vascular dementia ([place]) 04/13/2020   Alzheimer's dementia with behavioral disturbance ([place]) 10/13/2019   Open wound of scalp 03/01/2018   Anticoagulant long-term use 07/20/2017   Stage 3a chronic kidney disease (CKD) ([place]) 07/20/2017   Dizziness 07/20/2017   Other thrombophilia ([place]) 07/04/2017   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 03/28/2017   Acute gout of right ankle 09/09/2014   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 (HCC) 12/11/2013   BCC (basal cell carcinoma) 10/22/2012   Diverticulosis 10/22/2012   Iron deficiency anemia 10/22/2012   Atrial fibrillation ([place]) 10/22/2012   PAST MEDICAL HISTORY:  Active Ambulatory Problems    Diagnosis Date Noted   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 ([place]) 12/11/2013   Acute gout of right  ankle 09/09/2014   Anticoagulant long-term use 07/20/2017   BCC (basal cell carcinoma) 10/22/2012   Stage 3a chronic kidney disease (CKD) ([place]) 07/20/2017   Diverticulosis 10/22/2012   Dizziness 07/20/2017   Iron deficiency anemia 10/22/2012   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   Atrial fibrillation ([place]) 10/22/2012   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 03/28/2017   Open wound of scalp 03/01/2018   Alzheimer's dementia with behavioral disturbance ([place]) 10/13/2019   Mixed Alzheimer's and vascular dementia ([place]) 04/13/2020   Blood clotting disorder  ([place]) 12/14/2020   Hip fracture ([place]) 02/13/2021   DNR (do not resuscitate) 02/13/2021   Hyperglycemia 02/13/2021   Fracture, intertrochanteric, right femur, closed, initial encounter ([place])    CHF (congestive heart failure) ([place])    Protein-calorie malnutrition, severe 02/22/2021   Resolved Ambulatory Problems    Diagnosis Date Noted   No Resolved Ambulatory Problems   Past Medical History:  Diagnosis Date   Arthritis    Dysrhythmia    Heart murmur    Hypertension    Myocardial infarction ([place])    SOCIAL HX:  Social History   Tobacco Use   Smoking status: Never   Smokeless tobacco: Never   Tobacco comments:    never used tobacco  Substance Use Topics   Alcohol use: Yes    Alcohol/week: 1.0 standard drink    Types: 1 Glasses of wine per week   FAMILY HX:  Family History  Problem Relation Age of Onset   Heart disease Mother    Depression Mother    Heart failure Mother    Heart disease Father    Alcohol abuse Father    Heart attack Father    Depression Father    Multiple myeloma Maternal Grandmother    Alcohol abuse Maternal Grandfather    Breast cancer Paternal Grandmother    Breast cancer Daughter       ALLERGIES:  Allergies  Allergen Reactions   Shellfish Allergy Itching and Swelling    Shrimp      PERTINENT MEDICATIONS:  Outpatient Encounter Medications as of 03/30/2021  Medication Sig   acetaminophen (TYLENOL) 500 MG tablet Take 1,000 mg by mouth 3 (three) times daily.   amiodarone (PACERONE) 200 MG tablet Take 1 tablet (200 mg total) by mouth daily.   apixaban (ELIQUIS) 2.5 MG TABS tablet TAKE 1 TABLET BY MOUTH TWICE A DAY (Patient taking differently: Take 2.5 mg by mouth 2 (two) times daily.)   atorvastatin (LIPITOR) 20 MG tablet Take 20 mg by mouth at bedtime.   busPIRone (BUSPAR) 5 MG tablet Take 1 tablet (5 mg total) by mouth 2 (two) times daily.   cyanocobalamin 1000 MCG tablet Take 1,000 mcg by mouth every morning.   docusate sodium (COLACE)  100 MG capsule Take 1 capsule (100 mg total) by mouth 2 (two) times daily.   escitalopram (LEXAPRO) 5 MG tablet Take 5 mg by mouth every morning.   fluocinonide ointment (LIDEX) 1.00 % Apply 1 application topically See admin instructions. Order date 02/02/21 - apply topically to scalp twice daily Monday thru Friday until healed   furosemide (LASIX) 40 MG tablet Take 1 tablet (40 mg total) by mouth daily.   memantine (NAMENDA) 10 MG tablet Take 10 mg by mouth 2 (two) times daily. For memory   methocarbamol (ROBAXIN) 500 MG tablet Take 1 tablet (500 mg total) by mouth every 6 (six) hours as needed for muscle spasms.   midodrine (PROAMATINE) 10 MG tablet Take 1 tablet (10 mg  total) by mouth 3 (three) times daily with meals.   polyethylene glycol (MIRALAX / GLYCOLAX) 17 g packet Take 17 g by mouth daily.   QUEtiapine (SEROQUEL) 25 MG tablet Take 1 tablet (25 mg total) by mouth at bedtime.   No facility-administered encounter medications on file as of 03/30/2021.   Thank you for the opportunity to participate in the care of Ms. Rendell.  The palliative care team will continue to follow. Please call our office at [phone] if we can be of additional assistance.   Hollace Kinnier, DO   COVID-19 PATIENT SCREENING TOOL Asked and negative response unless otherwise noted:  Have you had symptoms of covid, tested positive or been in contact with someone with symptoms/positive test in the past 5-10 days? no

## 2021-04-03 NOTE — Progress Notes (Signed)
COMMUNITY PALLIATIVE CARE SW NOTE  PATIENT NAME: Kathy Massey DOB: 1928/02/12 MRN: [id number]  PRIMARY CARE PROVIDER: [name], MD  RESPONSIBLE PARTY:  Acct ID - Guarantor Home Phone Work Phone Relationship Acct Type  [id number] EZMA, [phone]  Self P/F     [address], [place], [place] [address]     PLAN OF CARE and INTERVENTIONS:             GOALS OF CARE/ ADVANCE CARE PLANNING:  Goal is for patient to return to SNF at completion of PT.  Patient is a DNR.  SOCIAL/EMOTIONAL/SPIRITUAL ASSESSMENT/ INTERVENTIONS:  PC SW completed a visit with patient at the the facility. SW found patient in her room, sitting in her wheelchair, eating her lunch. Patient greeted SW warmly. Patient was engaged, but memory deficits were noted. Patient had difficulty recalling words, names or events. Patient was eating her lunch without any issues. SW sat with patient while she ate her lunch, engaging her life review. Patient denied pain. SW consulted with the facility nurse and physical therapist who provided a status update on patient. Patient is a one person assist to stand. She can follow 100% of verbal cues. She walk a short distance with a walker, but has the propensity to lean back. She is having some trouble with coordination, but can follow one-step directions. She is finishing up two additional weeks of PT, OT and speech for cognition. SW completed a telephone call to patient's daughter-[name] to extend support and a update to her. Patient will return to skilled care, however her daughter is saddened that patient will not receive the programming she enjoyed in the memory care. She verbalized no concerns and is open to ongoing support by the palliative care team. No other concerns noted. PATIENT/CAREGIVER EDUCATION/ COPING:  Patient appears to be coping adequately. Her daughter is support. PERSONAL EMERGENCY PLAN:  Per facility protocol COMMUNITY RESOURCES COORDINATION/ HEALTH CARE  NAVIGATION:  Patient is currently in [facility] for rehab.  FINANCIAL/LEGAL CONCERNS/INTERVENTIONS:  Patient is concern about how she will be able to afford in-home care or AL care is she decides to go. Education and resources provided.      SOCIAL HX:  Social History   Tobacco Use   Smoking status: Never   Smokeless tobacco: Never   Tobacco comments:    never used tobacco  Substance Use Topics   Alcohol use: Yes    Alcohol/week: 1.0 standard drink    Types: 1 Glasses of wine per week    CODE STATUS: DNR ADVANCED DIRECTIVES: Yes MOST FORM COMPLETE:  Yes HOSPICE EDUCATION PROVIDED: No   Duration of visit and documentation: 60 minutes.   [address] [place], [place]

## 2021-04-04 ENCOUNTER — Encounter: Payer: Self-pay | Admitting: Internal Medicine

## 2021-04-06 ENCOUNTER — Encounter (HOSPITAL_BASED_OUTPATIENT_CLINIC_OR_DEPARTMENT_OTHER): Payer: Medicare PPO | Admitting: Orthopaedic Surgery

## 2021-04-13 ENCOUNTER — Other Ambulatory Visit: Payer: Self-pay

## 2021-04-13 ENCOUNTER — Ambulatory Visit (HOSPITAL_BASED_OUTPATIENT_CLINIC_OR_DEPARTMENT_OTHER)
Admission: RE | Admit: 2021-04-13 | Discharge: 2021-04-13 | Disposition: A | Discharge status: Home / Self Care | Payer: Medicare PPO | Source: Ambulatory Visit | Attending: Orthopaedic Surgery | Admitting: Orthopaedic Surgery

## 2021-04-13 ENCOUNTER — Other Ambulatory Visit (HOSPITAL_BASED_OUTPATIENT_CLINIC_OR_DEPARTMENT_OTHER): Payer: Self-pay | Admitting: Orthopaedic Surgery

## 2021-04-13 ENCOUNTER — Ambulatory Visit (INDEPENDENT_AMBULATORY_CARE_PROVIDER_SITE_OTHER): Payer: Medicare PPO | Admitting: Orthopaedic Surgery

## 2021-04-13 DIAGNOSIS — S72141A Displaced intertrochanteric fracture of right femur, initial encounter for closed fracture: Secondary | ICD-10-CM | POA: Insufficient documentation

## 2021-04-13 NOTE — Progress Notes (Signed)
? ?                            ? ? ?Post Operative Evaluation ?  ? ?Procedure/Date of Surgery: Right hip cephalomedullary nailing February 14, 2021 ? ?Interval History:  ? ?Presents today doing extremely well.  She is now walking with a walker.  She is able to get up and down steps as well as hallways at her house.  She is out of rehab.  She is doing physical therapy 2-3 times weekly.  Denies any hip pain at this point. ? ? ?PMH/PSH/Family History/Social History/Meds/Allergies:   ? ?Past Medical History:  ?Diagnosis Date  ? Arthritis   ? hands -oa  ? Dysrhythmia   ? afib  ? Heart murmur   ? Hypertension   ? 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 Myocardial infarction [facility])   ? 06/15/15: stress induced cardiomyopathy  ? ?Past Surgical History:  ?Procedure Laterality Date  ? CARDIAC CATHETERIZATION    ? 06/17/15 [facility]): No CAD, LVEDP 9 mmHg, borderline LV contraction with apical akinesis, suggestive of stress-induced CM. EF > 55% by 06/16/15 echo.  ? ELBOW BURSA SURGERY Right 2016  ? EYE SURGERY    ? cataracts removed ,both eyes, /w IOL  ? INTRAMEDULLARY (IM) NAIL INTERTROCHANTERIC Right 02/14/2021  ? Procedure: INTRAMEDULLARY (IM) NAIL INTERTROCHANTRIC;  Surgeon: Vanetta Mulders, MD;  Location: [place];  Service: Orthopedics;  Laterality: Right;  ? LYMPH NODE BIOPSY Left 01/14/2018  ? Procedure: EXCISION DEEP LEFT INGUINAL LYMPH NODE BIOPSY ERAS PATHWAY;  Surgeon: Fanny Skates, MD;  Location: [place];  Service: General;  Laterality: Left;  ? TONSILLECTOMY    ? ?Social History  ? ?Socioeconomic History  ? Marital status: Widowed  ?  Spouse name: Not on file  ? Number of children: Not on file  ? Years of education: Not on file  ? Highest education level: Not on file  ?Occupational History  ? Not on file  ?Tobacco Use  ? Smoking status: Never  ? Smokeless tobacco: Never  ? Tobacco comments:  ?  never used tobacco  ?Vaping Use  ? Vaping Use: Never used  ?Substance and Sexual Activity  ? Alcohol  use: Yes  ?  Alcohol/week: 1.0 standard drink  ?  Types: 1 Glasses of wine per week  ? Drug use: No  ? Sexual activity: Not on file  ?Other Topics Concern  ? Not on file  ?Social History Narrative  ? Not on file  ? ?Social Determinants of Health  ? ?Financial Resource Strain: Not on file  ?Food Insecurity: Not on file  ?Transportation Needs: Not on file  ?Physical Activity: Not on file  ?Stress: Not on file  ?Social Connections: Not on file  ? ?Family History  ?Problem Relation Age of Onset  ? Heart disease Mother   ? Depression Mother   ? Heart failure Mother   ? Heart disease Father   ? Alcohol abuse Father   ? Heart attack Father   ? Depression Father   ? Multiple myeloma Maternal Grandmother   ? Alcohol abuse Maternal Grandfather   ? Breast cancer Paternal Grandmother   ? Breast cancer Daughter   ? ?Allergies  ?Allergen Reactions  ? Shellfish Allergy Itching and Swelling  ?  Shrimp ?  ? ?Current Outpatient Medications  ?Medication Sig Dispense Refill  ? acetaminophen (TYLENOL) 500 MG tablet Take 1,000 mg by mouth 3 (  three) times daily.    ? amiodarone (PACERONE) 200 MG tablet Take 1 tablet (200 mg total) by mouth daily. 30 tablet 0  ? apixaban (ELIQUIS) 2.5 MG TABS tablet TAKE 1 TABLET BY MOUTH TWICE A DAY (Patient taking differently: Take 2.5 mg by mouth 2 (two) times daily.) 60 tablet 5  ? atorvastatin (LIPITOR) 20 MG tablet Take 20 mg by mouth at bedtime.    ? busPIRone (BUSPAR) 5 MG tablet Take 1 tablet (5 mg total) by mouth 2 (two) times daily. 30 tablet 0  ? cyanocobalamin 1000 MCG tablet Take 1,000 mcg by mouth every morning.    ? docusate sodium (COLACE) 100 MG capsule Take 1 capsule (100 mg total) by mouth 2 (two) times daily. 30 capsule 0  ? escitalopram (LEXAPRO) 5 MG tablet Take 5 mg by mouth every morning.    ? fluocinonide ointment (LIDEX) 6.96 % Apply 1 application topically See admin instructions. Order date 02/02/21 - apply topically to scalp twice daily Monday thru Friday until healed    ?  furosemide (LASIX) 40 MG tablet Take 1 tablet (40 mg total) by mouth daily. 30 tablet 0  ? memantine (NAMENDA) 10 MG tablet Take 10 mg by mouth 2 (two) times daily. For memory    ? methocarbamol (ROBAXIN) 500 MG tablet Take 1 tablet (500 mg total) by mouth every 6 (six) hours as needed for muscle spasms. 30 tablet 0  ? midodrine (PROAMATINE) 10 MG tablet Take 1 tablet (10 mg total) by mouth 3 (three) times daily with meals. 30 tablet 0  ? polyethylene glycol (MIRALAX / GLYCOLAX) 17 g packet Take 17 g by mouth daily. 14 each 0  ? QUEtiapine (SEROQUEL) 25 MG tablet Take 1 tablet (25 mg total) by mouth at bedtime. 10 tablet 0  ? ?No current facility-administered medications for this visit.  ? ?No results found. ? ?Review of Systems:   ?A ROS was performed including pertinent positives and negatives as documented in the HPI. ? ? ?Musculoskeletal Exam:   ? ?There were no vitals taken for this visit. ? ?Right hip incision is well-healed.  She is able to flex at the right hip 20 degrees while sitting in the chair.  Internal rotation of 30 as well as external rotation and 30 degrees of right hip without pain walks without antalgia. ? ?Imaging:   ? ?X-rays 2 views of right femur: ?Status post cephalomedullary nailing with interval callus formation and healing ? ?I personally reviewed and interpreted the radiographs. ? ? ?Assessment:   ?88 year old female status post right hip cephalomedullary nailing.  Overall she is doing extremely well.  At this time I advised that she can attempt to wean to a four-point walker.  I will plan to see her back in 3 months. ? ?Plan :   ? ?-Return to clinic in 3 months ? ? ? ? ?I personally saw and evaluated the patient, and participated in the management and treatment plan. ? ?Vanetta Mulders, MD ?Attending Physician, Orthopedic Surgery ? ?This document was dictated using Systems analyst. A reasonable attempt at proof reading has been made to minimize errors. ?

## 2021-04-15 ENCOUNTER — Other Ambulatory Visit: Payer: Self-pay

## 2021-04-15 ENCOUNTER — Encounter: Payer: Self-pay | Admitting: Student

## 2021-04-15 ENCOUNTER — Ambulatory Visit: Payer: Medicare PPO | Admitting: Student

## 2021-04-15 VITALS — BP 108/78 | HR 84 | Ht 59.0 in | Wt 111.4 lb

## 2021-04-15 DIAGNOSIS — I4821 Permanent atrial fibrillation: Secondary | ICD-10-CM | POA: Diagnosis not present

## 2021-04-15 DIAGNOSIS — I5020 Unspecified systolic (congestive) heart failure: Secondary | ICD-10-CM

## 2021-04-15 MED ORDER — AMIODARONE HCL 200 MG PO TABS: 100.0000 mg | ORAL_TABLET | Freq: Every day | ORAL | 3 refills | Status: DC

## 2021-04-15 NOTE — Patient Instructions (Signed)
Medication Instructions:  ?Your physician has recommended you make the following change in your medication:  ? ?DECREASE: Amiodarone to 100mg  daily ? ?*If you need a refill on your cardiac medications before your next appointment, please call your pharmacy* ? ? ?Lab Work: ?TODAY: CMET, TSH, CBC ? ?If you have labs (blood work) drawn today and your tests are completely normal, you will receive your results only by: ?MyChart Message (if you have MyChart) OR ?A paper copy in the mail ?If you have any lab test that is abnormal or we need to change your treatment, we will call you to review the results. ? ? ?Follow-Up: ?At [facility], you and your health needs are our priority.  As part of our continuing mission to provide you with exceptional heart care, we have created designated Provider Care Teams.  These Care Teams include your primary Cardiologist (physician) and Advanced Practice Providers (APPs -  Physician Assistants and Nurse Practitioners) who all work together to provide you with the care you need, when you need it. ? ?Your next appointment:   ?6 month(s) ? ?The format for your next appointment:   ?In Person ? ?Provider:   ?Allegra Lai, MD   ?

## 2021-04-15 NOTE — Progress Notes (Signed)
? ?PCP:  [name], MD ?Primary Cardiologist: Will Meredith Leeds, MD ?Electrophysiologist: Will Meredith Leeds, MD  ? ?Kathy Massey is a 88 y.o. female seen today for Will Meredith Leeds, MD for post hospital follow up.  ? ?Pt admitted 1/1 - 02/24/2021 for fall and R hip fracture. Course complicated by AF RVR and A/C systolic CHF. Diuresed and started on IV amiodarone for rate control. Required midodrine for hypotension with rate control.  ? ? ? Since discharge from hospital the patient reports doing well. At a facility and using a walker to get around. Denies chest pain, palpitations, dyspnea, PND, orthopnea, nausea, vomiting, dizziness, syncope, edema, weight gain, or early satiety. Dementia confounds history. Daughter is present who states she felt poorly this am, but was not otherwise specific.  ? ?Past Medical History:  ?Diagnosis Date  ? Arthritis   ? hands -oa  ? Dysrhythmia   ? afib  ? Heart murmur   ? Hypertension   ? 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 Myocardial infarction [facility])   ? 06/15/15: stress induced cardiomyopathy  ? ?Past Surgical History:  ?Procedure Laterality Date  ? CARDIAC CATHETERIZATION    ? 06/17/15 [facility]): No CAD, LVEDP 9 mmHg, borderline LV contraction with apical akinesis, suggestive of stress-induced CM. EF > 55% by 06/16/15 echo.  ? ELBOW BURSA SURGERY Right 2016  ? EYE SURGERY    ? cataracts removed ,both eyes, /w IOL  ? INTRAMEDULLARY (IM) NAIL INTERTROCHANTERIC Right 02/14/2021  ? Procedure: INTRAMEDULLARY (IM) NAIL INTERTROCHANTRIC;  Surgeon: Vanetta Mulders, MD;  Location: [place];  Service: Orthopedics;  Laterality: Right;  ? LYMPH NODE BIOPSY Left 01/14/2018  ? Procedure: EXCISION DEEP LEFT INGUINAL LYMPH NODE BIOPSY ERAS PATHWAY;  Surgeon: Fanny Skates, MD;  Location: [place];  Service: General;  Laterality: Left;  ? TONSILLECTOMY    ? ? ?Current Outpatient Medications  ?Medication Sig Dispense Refill  ? amiodarone (PACERONE) 200 MG  tablet Take 1 tablet (200 mg total) by mouth daily. 30 tablet 0  ? apixaban (ELIQUIS) 2.5 MG TABS tablet TAKE 1 TABLET BY MOUTH TWICE A DAY (Patient taking differently: Take 2.5 mg by mouth 2 (two) times daily.) 60 tablet 5  ? cyanocobalamin 1000 MCG tablet Take 1,000 mcg by mouth every morning.    ? docusate sodium (COLACE) 100 MG capsule Take 1 capsule (100 mg total) by mouth 2 (two) times daily. 30 capsule 0  ? escitalopram (LEXAPRO) 5 MG tablet Take 5 mg by mouth every morning.    ? furosemide (LASIX) 40 MG tablet Take 1 tablet (40 mg total) by mouth daily. 30 tablet 0  ? methocarbamol (ROBAXIN) 500 MG tablet Take 1 tablet (500 mg total) by mouth every 6 (six) hours as needed for muscle spasms. 30 tablet 0  ? midodrine (PROAMATINE) 10 MG tablet Take 1 tablet (10 mg total) by mouth 3 (three) times daily with meals. 30 tablet 0  ? polyethylene glycol (MIRALAX / GLYCOLAX) 17 g packet Take 17 g by mouth daily. 14 each 0  ? QUEtiapine (SEROQUEL) 25 MG tablet Take 1 tablet (25 mg total) by mouth at bedtime. 10 tablet 0  ? atorvastatin (LIPITOR) 20 MG tablet Take 20 mg by mouth at bedtime. (Patient not taking: Reported on 04/15/2021)    ? busPIRone (BUSPAR) 5 MG tablet Take 1 tablet (5 mg total) by mouth 2 (two) times daily. (Patient not taking: Reported on 04/15/2021) 30 tablet 0  ? fluocinonide ointment (  LIDEX) 0.81 % Apply 1 application topically See admin instructions. Order date 02/02/21 - apply topically to scalp twice daily Monday thru Friday until healed (Patient not taking: Reported on 04/15/2021)    ? memantine (NAMENDA) 10 MG tablet Take 10 mg by mouth 2 (two) times daily. For memory (Patient not taking: Reported on 04/15/2021)    ? ?No current facility-administered medications for this visit.  ? ? ?Allergies  ?Allergen Reactions  ? Shellfish Allergy Itching and Swelling  ?  Shrimp ?  ? ? ?Social History  ? ?Socioeconomic History  ? Marital status: Widowed  ?  Spouse name: Not on file  ? Number of children: Not on  file  ? Years of education: Not on file  ? Highest education level: Not on file  ?Occupational History  ? Not on file  ?Tobacco Use  ? Smoking status: Never  ? Smokeless tobacco: Never  ? Tobacco comments:  ?  never used tobacco  ?Vaping Use  ? Vaping Use: Never used  ?Substance and Sexual Activity  ? Alcohol use: Yes  ?  Alcohol/week: 1.0 standard drink  ?  Types: 1 Glasses of wine per week  ? Drug use: No  ? Sexual activity: Not on file  ?Other Topics Concern  ? Not on file  ?Social History Narrative  ? Not on file  ? ?Social Determinants of Health  ? ?Financial Resource Strain: Not on file  ?Food Insecurity: Not on file  ?Transportation Needs: Not on file  ?Physical Activity: Not on file  ?Stress: Not on file  ?Social Connections: Not on file  ?Intimate Partner Violence: Not on file  ? ? ? ?Review of Systems: ?All other systems reviewed and are otherwise negative except as noted above. ? ?Physical Exam: ?Vitals:  ? 04/15/21 1139  ?BP: 108/78  ?Pulse: 84  ?SpO2: 96%  ?Weight: 111 lb 6.4 oz (50.5 kg)  ?Height: 4\' 11"  (1.499 m)  ? ? ?GEN- The patient is well appearing, alert and oriented x 3 today.   ?HEENT: normocephalic, atraumatic; sclera clear, conjunctiva pink; hearing intact; oropharynx clear; neck supple, no JVP ?Lymph- no cervical lymphadenopathy ?Lungs- Clear to ausculation bilaterally, normal work of breathing.  No wheezes, rales, rhonchi ?Heart- Regular rate and rhythm, no murmurs, rubs or gallops, PMI not laterally displaced ?GI- soft, non-tender, non-distended, bowel sounds present, no hepatosplenomegaly ?Extremities- no clubbing, cyanosis, or edema; DP/PT/radial pulses 2+ bilaterally ?MS- no significant deformity or atrophy ?Skin- warm and dry, no rash or lesion ?Psych- euthymic mood, full affect ?Neuro- strength and sensation are intact ? ?EKG is ordered. Personal review of EKG from today shows AF at 84 bpm ? ?Additional studies reviewed include: ?Previous EP office notes.  ? ?Assessment and  Plan: ? ?1. Permanent Atrial fibrillation ?Continue eliquis 2.5 mg BID ?Decrease amiodarone to 100 mg daily. Will plan on continuing long term for rate control in shared decision making today. She has poorly tolerated diltiazem with hypotension.  ?CHA2DS2/VASc is at least 4.  ? ?2. H/o stress-induced CMP ?Echo 02/16/2021 LVEF 30-35% in the setting of illness and AF RVR ?No room for GDMT with hypotension requiring high dose midodrine ? ?3. Hypotension ?Should make sure she is taking midodrine during WAKING hours. Note written to facility. ? ?Follow up with Dr. Curt Bears in 12 months  ? ?Shirley Friar, PA-C  ?04/15/21 ?11:47 AM ? ?

## 2021-04-16 LAB — COMPREHENSIVE METABOLIC PANEL
ALT: 7 IU/L (ref 0–32)
AST: 25 IU/L (ref 0–40)
Albumin/Globulin Ratio: 1.9 (ref 1.2–2.2)
Albumin: 4.6 g/dL (ref 3.5–4.6)
Alkaline Phosphatase: 238 IU/L — ABNORMAL HIGH (ref 44–121)
BUN/Creatinine Ratio: 13 (ref 12–28)
BUN: 16 mg/dL (ref 10–36)
Bilirubin Total: 0.7 mg/dL (ref 0.0–1.2)
CO2: 22 mmol/L (ref 20–29)
Calcium: 10.1 mg/dL (ref 8.7–10.3)
Chloride: 101 mmol/L (ref 96–106)
Creatinine, Ser: 1.24 mg/dL — ABNORMAL HIGH (ref 0.57–1.00)
Globulin, Total: 2.4 g/dL (ref 1.5–4.5)
Glucose: 87 mg/dL (ref 70–99)
Potassium: 3.8 mmol/L (ref 3.5–5.2)
Sodium: 143 mmol/L (ref 134–144)
Total Protein: 7 g/dL (ref 6.0–8.5)
eGFR: 41 mL/min/{1.73_m2} — ABNORMAL LOW (ref >59)

## 2021-04-16 LAB — CBC
Hematocrit: 39.5 % (ref 34.0–46.6)
Hemoglobin: 13.1 g/dL (ref 11.1–15.9)
MCH: 31 pg (ref 26.6–33.0)
MCHC: 33.2 g/dL (ref 31.5–35.7)
MCV: 94 fL (ref 79–97)
Platelets: 284 10*3/uL (ref 150–450)
RBC: 4.22 x10E6/uL (ref 3.77–5.28)
RDW: 14.1 % (ref 11.7–15.4)
WBC: 5.1 10*3/uL (ref 3.4–10.8)

## 2021-04-16 LAB — TSH: TSH: 3.86 u[IU]/mL (ref 0.450–4.500)

## 2021-05-11 ENCOUNTER — Ambulatory Visit: Payer: Medicare PPO | Admitting: Podiatry

## 2021-06-16 ENCOUNTER — Encounter: Payer: Self-pay | Admitting: Hematology & Oncology

## 2021-06-16 ENCOUNTER — Inpatient Hospital Stay: Payer: Medicare PPO | Attending: Hematology & Oncology

## 2021-06-16 ENCOUNTER — Inpatient Hospital Stay: Payer: Medicare PPO | Admitting: Hematology & Oncology

## 2021-06-16 ENCOUNTER — Other Ambulatory Visit: Payer: Self-pay

## 2021-06-16 VITALS — BP 123/71 | HR 85 | Temp 97.4°F | Resp 18 | Wt 108.0 lb

## 2021-06-16 DIAGNOSIS — C8583 Other specified types of non-Hodgkin lymphoma, intra-abdominal lymph nodes: Secondary | ICD-10-CM | POA: Diagnosis not present

## 2021-06-16 DIAGNOSIS — Z8572 Personal history of non-Hodgkin lymphomas: Secondary | ICD-10-CM | POA: Insufficient documentation

## 2021-06-16 DIAGNOSIS — Z9221 Personal history of antineoplastic chemotherapy: Secondary | ICD-10-CM | POA: Diagnosis not present

## 2021-06-16 DIAGNOSIS — Z79899 Other long term (current) drug therapy: Secondary | ICD-10-CM | POA: Insufficient documentation

## 2021-06-16 LAB — LACTATE DEHYDROGENASE: LDH: 166 U/L (ref 98–192)

## 2021-06-16 LAB — CBC WITH DIFFERENTIAL (CANCER CENTER ONLY)
Abs Immature Granulocytes: 0.02 10*3/uL (ref 0.00–0.07)
Basophils Absolute: 0.1 10*3/uL (ref 0.0–0.1)
Basophils Relative: 1 %
Eosinophils Absolute: 0.1 10*3/uL (ref 0.0–0.5)
Eosinophils Relative: 2 %
HCT: 41.4 % (ref 36.0–46.0)
Hemoglobin: 13.4 g/dL (ref 12.0–15.0)
Immature Granulocytes: 0 %
Lymphocytes Relative: 19 %
Lymphs Abs: 1 10*3/uL (ref 0.7–4.0)
MCH: 30.1 pg (ref 26.0–34.0)
MCHC: 32.4 g/dL (ref 30.0–36.0)
MCV: 93 fL (ref 80.0–100.0)
Monocytes Absolute: 0.4 10*3/uL (ref 0.1–1.0)
Monocytes Relative: 8 %
Neutro Abs: 3.7 10*3/uL (ref 1.7–7.7)
Neutrophils Relative %: 70 %
Platelet Count: 269 10*3/uL (ref 150–400)
RBC: 4.45 MIL/uL (ref 3.87–5.11)
RDW: 13.6 % (ref 11.5–15.5)
WBC Count: 5.3 10*3/uL (ref 4.0–10.5)
nRBC: 0 % (ref 0.0–0.2)

## 2021-06-16 LAB — CMP (CANCER CENTER ONLY)
ALT: 6 U/L (ref 0–44)
AST: 22 U/L (ref 15–41)
Albumin: 4.3 g/dL (ref 3.5–5.0)
Alkaline Phosphatase: 156 U/L — ABNORMAL HIGH (ref 38–126)
Anion gap: 12 (ref 5–15)
BUN: 24 mg/dL — ABNORMAL HIGH (ref 8–23)
CO2: 25 mmol/L (ref 22–32)
Calcium: 9.3 mg/dL (ref 8.9–10.3)
Chloride: 103 mmol/L (ref 98–111)
Creatinine: 1.53 mg/dL — ABNORMAL HIGH (ref 0.44–1.00)
GFR, Estimated: 32 mL/min — ABNORMAL LOW (ref >60)
Glucose, Bld: 117 mg/dL — ABNORMAL HIGH (ref 70–99)
Potassium: 3.5 mmol/L (ref 3.5–5.1)
Sodium: 140 mmol/L (ref 135–145)
Total Bilirubin: 0.7 mg/dL (ref 0.3–1.2)
Total Protein: 6.7 g/dL (ref 6.5–8.1)

## 2021-06-16 NOTE — Progress Notes (Signed)
?Hematology and Oncology Follow Up Visit ? ?Kathy Massey ?[id number] ?01/05/1928 88 y.o. ?06/16/2021 ? ? ?Principle Diagnosis:  ?Recurrent small B-cell follicular NHL ?  ?Past Therapy:        ?Acalabrutinib 100 mg po q day - started on 02/25/2018 -- d/c on 10/22/2019 ? ?Current Therapy: ?Observation ?  ?Interim History:  Kathy Massey is here today with her daughter for follow-up.  Unfortunately, since we last saw her, she fell and broke her right hip.  This was I think in January.  She was in the hospital for 2 and half weeks.  I guess it is a blessing that she really does not remember much of this. ? ?She is still living at [facility].  She is in the Memory Unit. ? ?She is lost quite a bit of weight since we last saw her.  Some of this may have been from her being in the hospital. ? ?She does not have any fever.  She has had no bleeding. ? ?There has been no obvious change in bowel or bladder habits. ? ?Overall, I would have to say that her performance status is probably ECOG 3.   ? ?Medications:  ?Allergies as of 06/16/2021   ? ?   Reactions  ? Shellfish Allergy Itching, Swelling  ? Shrimp  ? ?  ? ?  ?Medication List  ?  ? ?  ? Accurate as of Jun 16, 2021  4:05 PM. If you have any questions, ask your nurse or doctor.  ?  ?  ? ?  ? ?amiodarone 200 MG tablet ?Commonly known as: PACERONE ?Take 0.5 tablets (100 mg total) by mouth daily. ?  ?atorvastatin 20 MG tablet ?Commonly known as: LIPITOR ?Take 20 mg by mouth at bedtime. ?  ?busPIRone 5 MG tablet ?Commonly known as: BUSPAR ?Take 1 tablet (5 mg total) by mouth 2 (two) times daily. ?  ?cyanocobalamin 1000 MCG tablet ?Take 1,000 mcg by mouth every morning. ?  ?docusate sodium 100 MG capsule ?Commonly known as: COLACE ?Take 1 capsule (100 mg total) by mouth 2 (two) times daily. ?  ?Eliquis 2.5 MG Tabs tablet ?Generic drug: apixaban ?TAKE 1 TABLET BY MOUTH TWICE A DAY ?What changed: how much to take ?  ?escitalopram 5 MG tablet ?Commonly known as: LEXAPRO ?Take 5 mg by mouth  every morning. ?  ?fluocinonide ointment 0.05 % ?Commonly known as: LIDEX ?Apply 1 application topically See admin instructions. Order date 02/02/21 - apply topically to scalp twice daily Monday thru Friday until healed ?  ?furosemide 40 MG tablet ?Commonly known as: LASIX ?Take 1 tablet (40 mg total) by mouth daily. ?  ?imiquimod 5 % cream ?Commonly known as: ALDARA ?Apply topically. ?  ?memantine 10 MG tablet ?Commonly known as: NAMENDA ?Take 10 mg by mouth 2 (two) times daily. For memory ?  ?methocarbamol 500 MG tablet ?Commonly known as: ROBAXIN ?Take 1 tablet (500 mg total) by mouth every 6 (six) hours as needed for muscle spasms. ?  ?midodrine 10 MG tablet ?Commonly known as: PROAMATINE ?Take 1 tablet (10 mg total) by mouth 3 (three) times daily with meals. ?  ?polyethylene glycol 17 g packet ?Commonly known as: MIRALAX / GLYCOLAX ?Take 17 g by mouth daily. ?  ?QUEtiapine 25 MG tablet ?Commonly known as: SEROQUEL ?Take 1 tablet (25 mg total) by mouth at bedtime. ?  ? ?  ? ? ?Allergies:  ?Allergies  ?Allergen Reactions  ? Shellfish Allergy Itching and Swelling  ?  Shrimp ?  ? ? ?  Past Medical History, Surgical history, Social history, and Family History were reviewed and updated. ? ?Review of Systems: ?Review of Systems  ?Constitutional: Negative.   ?HENT:  Positive for hearing loss.   ?Eyes:  Positive for blurred vision.  ?Respiratory: Negative.    ?Cardiovascular:  Positive for palpitations.  ?Gastrointestinal: Negative.   ?Genitourinary: Negative.   ?Musculoskeletal: Negative.   ?Skin: Negative.   ?Neurological:  Positive for dizziness.  ?Endo/Heme/Allergies: Negative.   ?Psychiatric/Behavioral: Negative.    ? ? ?Physical Exam: ? weight is 108 lb (49 kg). Her oral temperature is 97.4 ?F (36.3 ?C) (abnormal). Her blood pressure is 123/71 and her pulse is 85. Her respiration is 18 and oxygen saturation is 100%.  ? ?Wt Readings from Last 3 Encounters:  ?06/16/21 108 lb (49 kg)  ?04/15/21 111 lb 6.4 oz (50.5 kg)   ?02/16/21 119 lb 4.3 oz (54.1 kg)  ? ? ?Physical Exam ?Vitals reviewed.  ?HENT:  ?   Head: Normocephalic and atraumatic.  ?Eyes:  ?   Pupils: Pupils are equal, round, and reactive to light.  ?Cardiovascular:  ?   Rate and Rhythm: Normal rate and regular rhythm.  ?   Heart sounds: Normal heart sounds.  ?   Comments: Cardiac exam shows irregular rate and irregular rhythm consistent with atrial fibrillation.  The rate is fairly well controlled.  She has no murmurs, rubs or bruits. ?Pulmonary:  ?   Effort: Pulmonary effort is normal.  ?   Breath sounds: Normal breath sounds.  ?Abdominal:  ?   General: Bowel sounds are normal.  ?   Palpations: Abdomen is soft.  ?Musculoskeletal:     ?   General: No tenderness or deformity. Normal range of motion.  ?   Cervical back: Normal range of motion.  ?   Comments: Her extremities does show some nonpitting edema in the lower legs.  This is mild.  She has arthritic changes in her joints.  ?Lymphadenopathy:  ?   Cervical: No cervical adenopathy.  ?Skin: ?   General: Skin is warm and dry.  ?   Findings: No erythema or rash.  ?Neurological:  ?   Mental Status: She is alert and oriented to person, place, and time.  ?Psychiatric:     ?   Behavior: Behavior normal.     ?   Thought Content: Thought content normal.     ?   Judgment: Judgment normal.  ? ? ?Lab Results  ?Component Value Date  ? WBC 5.3 06/16/2021  ? HGB 13.4 06/16/2021  ? HCT 41.4 06/16/2021  ? MCV 93.0 06/16/2021  ? PLT 269 06/16/2021  ? ?Lab Results  ?Component Value Date  ? FERRITIN 99 02/15/2021  ? IRON 20 (L) 02/15/2021  ? TIBC 227 (L) 02/15/2021  ? UIBC 207 02/15/2021  ? IRONPCTSAT 9 (L) 02/15/2021  ? ?Lab Results  ?Component Value Date  ? RETICCTPCT 1.7 02/15/2021  ? RBC 4.45 06/16/2021  ? ?Lab Results  ?Component Value Date  ? KPAFRELGTCHN 11.9 05/19/2019  ? LAMBDASER 9.1 05/19/2019  ? KAPLAMBRATIO 1.31 05/19/2019  ? ?Lab Results  ?Component Value Date  ? IGGSERUM 573 (L) 05/19/2019  ? IGA 66 05/19/2019  ? IGMSERUM  37 05/19/2019  ? ?No results found for: TOTALPROTELP, ALBUMINELP, A1GS, A2GS, BETS, BETA2SER, GAMS, MSPIKE, SPEI ?  Chemistry   ?   ?Component Value Date/Time  ? NA 140 06/16/2021 1511  ? NA 143 04/15/2021 1209  ? NA 144 10/17/2016 1000  ? K 3.5  06/16/2021 1511  ? K 4.5 10/17/2016 1000  ? CL 103 06/16/2021 1511  ? CL 106 10/17/2016 1000  ? CO2 25 06/16/2021 1511  ? CO2 30 10/17/2016 1000  ? BUN 24 (H) 06/16/2021 1511  ? BUN 16 04/15/2021 1209  ? BUN 18 10/17/2016 1000  ? CREATININE 1.53 (H) 06/16/2021 1511  ? CREATININE 1.6 (H) 10/17/2016 1000  ?    ?Component Value Date/Time  ? CALCIUM 9.3 06/16/2021 1511  ? CALCIUM 9.4 10/17/2016 1000  ? ALKPHOS 156 (H) 06/16/2021 1511  ? ALKPHOS 101 (H) 10/17/2016 1000  ? AST 22 06/16/2021 1511  ? ALT 6 06/16/2021 1511  ? ALT 21 10/17/2016 1000  ? BILITOT 0.7 06/16/2021 1511  ?  ? ? ? ?Impression and Plan: Ms. Roam is 88 yo white female with recurrent small B-cell follicular NHL. She was originally treated with systemic chemotherapy with Rituxan/Treanda followed by maintenance Rituxan completed in February 2018. ? ?She was treated with acalabrutinib for recurrent disease.  She did well with this for a while and then began to have some toxicity so we stopped it.  We stopped it about a year and a half ago.  By her blood counts and blood smear, I do not see any problems with recurrence. ? ?I really hate the fact that she fell.  I know that she is certainly a fall risk. ? ?We will still plan for another follow-up in 6 months.  At some point, she may get to the point where she is not can be able to make it into see [place].  I certainly would be okay with this.  I know that it can be challenging to get her to see [place]. ? ? ?[name], MD ?5/4/20234:05 PM ?

## 2021-07-10 ENCOUNTER — Encounter (HOSPITAL_COMMUNITY): Payer: Self-pay

## 2021-07-10 ENCOUNTER — Emergency Department (HOSPITAL_COMMUNITY)
Admission: EM | Admit: 2021-07-10 | Discharge: 2021-07-10 | Disposition: A | Discharge status: Home / Self Care | Payer: Medicare PPO | Attending: Emergency Medicine | Admitting: Emergency Medicine

## 2021-07-10 ENCOUNTER — Emergency Department (HOSPITAL_COMMUNITY): Payer: Medicare PPO

## 2021-07-10 ENCOUNTER — Other Ambulatory Visit: Payer: Self-pay

## 2021-07-10 DIAGNOSIS — W19XXXA Unspecified fall, initial encounter: Secondary | ICD-10-CM | POA: Insufficient documentation

## 2021-07-10 DIAGNOSIS — Z7901 Long term (current) use of anticoagulants: Secondary | ICD-10-CM | POA: Diagnosis not present

## 2021-07-10 DIAGNOSIS — S0990XA Unspecified injury of head, initial encounter: Secondary | ICD-10-CM | POA: Diagnosis present

## 2021-07-10 DIAGNOSIS — I4891 Unspecified atrial fibrillation: Secondary | ICD-10-CM | POA: Diagnosis not present

## 2021-07-10 DIAGNOSIS — E876 Hypokalemia: Secondary | ICD-10-CM | POA: Insufficient documentation

## 2021-07-10 LAB — CBC WITH DIFFERENTIAL/PLATELET
Abs Immature Granulocytes: 0.05 10*3/uL (ref 0.00–0.07)
Basophils Absolute: 0.1 10*3/uL (ref 0.0–0.1)
Basophils Relative: 1 %
Eosinophils Absolute: 0 10*3/uL (ref 0.0–0.5)
Eosinophils Relative: 1 %
HCT: 44.4 % (ref 36.0–46.0)
Hemoglobin: 14.3 g/dL (ref 12.0–15.0)
Immature Granulocytes: 1 %
Lymphocytes Relative: 11 %
Lymphs Abs: 0.9 10*3/uL (ref 0.7–4.0)
MCH: 30.6 pg (ref 26.0–34.0)
MCHC: 32.2 g/dL (ref 30.0–36.0)
MCV: 95.1 fL (ref 80.0–100.0)
Monocytes Absolute: 0.5 10*3/uL (ref 0.1–1.0)
Monocytes Relative: 7 %
Neutro Abs: 6.2 10*3/uL (ref 1.7–7.7)
Neutrophils Relative %: 79 %
Platelets: 241 10*3/uL (ref 150–400)
RBC: 4.67 MIL/uL (ref 3.87–5.11)
RDW: 13.5 % (ref 11.5–15.5)
WBC: 7.8 10*3/uL (ref 4.0–10.5)
nRBC: 0 % (ref 0.0–0.2)

## 2021-07-10 LAB — BASIC METABOLIC PANEL
Anion gap: 12 (ref 5–15)
BUN: 17 mg/dL (ref 8–23)
CO2: 26 mmol/L (ref 22–32)
Calcium: 9.9 mg/dL (ref 8.9–10.3)
Chloride: 103 mmol/L (ref 98–111)
Creatinine, Ser: 1.34 mg/dL — ABNORMAL HIGH (ref 0.44–1.00)
GFR, Estimated: 37 mL/min — ABNORMAL LOW (ref >60)
Glucose, Bld: 93 mg/dL (ref 70–99)
Potassium: 3.1 mmol/L — ABNORMAL LOW (ref 3.5–5.1)
Sodium: 141 mmol/L (ref 135–145)

## 2021-07-10 MED ORDER — POTASSIUM CHLORIDE CRYS ER 20 MEQ PO TBCR
40.0000 meq | EXTENDED_RELEASE_TABLET | Freq: Once | ORAL | Status: AC
  Administered 2021-07-10: 40 meq via ORAL
  Filled 2021-07-10: qty 2

## 2021-07-10 NOTE — ED Provider Notes (Addendum)
[facility] EMERGENCY DEPARTMENT Provider Note   CSN: [id number] Arrival date & time:        History  Chief Complaint  Patient presents with   Kathy Massey is a 88 y.o. female.  Patient presents ER chief complaint of a fall.  Lives at a nursing home, reportedly fell and was found on the ground unwitnessed.  Last seen about 2 hours prior to her fall.  Patient denies any pain denies headache or neck pain or back pain.  Unable to obtain additional history given.  Condition of dementia.  No reports of fevers or cough or vomiting or diarrhea per facility.      Home Medications Prior to Admission medications   Medication Sig Start Date End Date Taking? Authorizing Provider  amiodarone (PACERONE) 200 MG tablet Take 0.5 tablets (100 mg total) by mouth daily. 04/15/21   Shirley Friar, PA-C  apixaban (ELIQUIS) 2.5 MG TABS tablet TAKE 1 TABLET BY MOUTH TWICE A DAY Patient taking differently: Take 2.5 mg by mouth 2 (two) times daily. 10/26/20   Camnitz, Ocie Doyne, MD  atorvastatin (LIPITOR) 20 MG tablet Take 20 mg by mouth at bedtime. Patient not taking: Reported on 04/15/2021    [provider]  busPIRone (BUSPAR) 5 MG tablet Take 1 tablet (5 mg total) by mouth 2 (two) times daily. Patient not taking: Reported on 04/15/2021 02/24/21   Aline August, MD  cyanocobalamin 1000 MCG tablet Take 1,000 mcg by mouth every morning.    [provider]  docusate sodium (COLACE) 100 MG capsule Take 1 capsule (100 mg total) by mouth 2 (two) times daily. 02/24/21   Aline August, MD  escitalopram (LEXAPRO) 5 MG tablet Take 5 mg by mouth every morning. 09/06/20   [provider]  fluocinonide ointment (LIDEX) 4.25 % Apply 1 application topically See admin instructions. Order date 02/02/21 - apply topically to scalp twice daily Monday thru Friday until healed Patient not taking: Reported on 04/15/2021    [provider]  furosemide (LASIX)  40 MG tablet Take 1 tablet (40 mg total) by mouth daily. 02/24/21   Aline August, MD  imiquimod [place]) 5 % cream Apply topically. 05/26/21   [provider]  memantine (NAMENDA) 10 MG tablet Take 10 mg by mouth 2 (two) times daily. For memory Patient not taking: Reported on 04/15/2021 11/11/20   [provider]  methocarbamol (ROBAXIN) 500 MG tablet Take 1 tablet (500 mg total) by mouth every 6 (six) hours as needed for muscle spasms. 02/24/21   Aline August, MD  midodrine (PROAMATINE) 10 MG tablet Take 1 tablet (10 mg total) by mouth 3 (three) times daily with meals. 02/24/21   Aline August, MD  polyethylene glycol (MIRALAX / GLYCOLAX) 17 g packet Take 17 g by mouth daily. 02/24/21   Aline August, MD  QUEtiapine (SEROQUEL) 25 MG tablet Take 1 tablet (25 mg total) by mouth at bedtime. 02/24/21   Aline August, MD      Allergies    Shellfish allergy    Review of Systems   Review of Systems  Unable to perform ROS: Dementia   Physical Exam Updated Vital Signs BP (!) 150/94   Pulse 74   Temp 97.7 F (36.5 C) (Oral)   Resp (!) 27   SpO2 100%  Physical Exam Constitutional:      General: She is not in acute distress.    Appearance: Normal appearance.  HENT:  Head: Normocephalic.     Nose: Nose normal.  Eyes:     Extraocular Movements: Extraocular movements intact.  Cardiovascular:     Rate and Rhythm: Normal rate.  Pulmonary:     Effort: Pulmonary effort is normal.  Musculoskeletal:        General: Normal range of motion.     Cervical back: Normal range of motion.     Comments: Ranging her C-spine without tenderness.  No C or T or L-spine midline step-offs or tenderness noted.  Normal range of motion bilateral shoulders elbows wrists and knees hips and ankles without pain.  Neurological:     General: No focal deficit present.     Mental Status: She is alert. Mental status is at baseline.    ED Results / Procedures / Treatments   Labs (all labs  ordered are listed, but only abnormal results are displayed) Labs Reviewed  BASIC METABOLIC PANEL - Abnormal; Notable for the following components:      Result Value   Potassium 3.1 (*)    Creatinine, Ser 1.34 (*)    GFR, Estimated 37 (*)    All other components within normal limits  CBC WITH DIFFERENTIAL/PLATELET    EKG EKG Interpretation  Date/Time:  Sunday Jul 10 2021 10:19:21 EDT Ventricular Rate:  90 PR Interval:    QRS Duration: 102 QT Interval:  406 QTC Calculation: 489 R Axis:   -4 Text Interpretation: Atrial fibrillation Ventricular premature complex Abnormal R-wave progression, early transition Nonspecific repol abnormality, diffuse leads Borderline prolonged QT interval Baseline wander in lead(s) III V6 Confirmed by Thamas Jaegers ([id number]) on 07/10/2021 11:25:49 AM  Radiology CT Head Wo Contrast  Result Date: 07/10/2021 CLINICAL DATA:  88 year old female with head and injury from fall with altered mental status. Patient on blood thinners. EXAM: CT HEAD WITHOUT CONTRAST CT CERVICAL SPINE WITHOUT CONTRAST TECHNIQUE: Multidetector CT imaging of the head and cervical spine was performed following the standard protocol without intravenous contrast. Multiplanar CT image reconstructions of the cervical spine were also generated. RADIATION DOSE REDUCTION: This exam was performed according to the departmental dose-optimization program which includes automated exposure control, adjustment of the mA and/or kV according to patient size and/or use of iterative reconstruction technique. COMPARISON:  02/13/2021 and prior CTs FINDINGS: CT HEAD FINDINGS Brain: No evidence of acute infarction, hemorrhage, hydrocephalus, extra-axial collection or mass lesion/mass effect. Atrophy, chronic small-vessel white matter ischemic changes and remote LEFT cerebellar infarct again noted. Vascular: Carotid atherosclerotic calcifications are noted. Skull: Normal. Negative for fracture or focal lesion.  Sinuses/Orbits: No new abnormalities are identified. A small amount of fluid in the LEFT sphenoid sinus noted. Other: None. CT CERVICAL SPINE FINDINGS Alignment: 2 mm spondylolisthesis at C3-4 at C5-6 is unchanged. No acute subluxation noted. Skull base and vertebrae: No acute fracture. No primary bone lesion or focal pathologic process. Soft tissues and spinal canal: No prevertebral fluid or swelling. No visible canal hematoma. Disc levels: Multilevel degenerative disc disease, spondylosis and facet arthropathy again identified. Central spinal and bony foraminal narrowing is unchanged. Upper chest: No acute abnormality Other: None IMPRESSION: 1. No evidence of acute intracranial abnormality. Atrophy, chronic small-vessel white matter ischemic changes and remote LEFT cerebellar infarct. 2. No static evidence of acute injury to the cervical spine. Multilevel degenerative changes again noted. Electronically Signed   By: Margarette Canada M.D.   On: 07/10/2021 11:00   CT Cervical Spine Wo Contrast  Result Date: 07/10/2021 CLINICAL DATA:  88 year old female with head and injury from  fall with altered mental status. Patient on blood thinners. EXAM: CT HEAD WITHOUT CONTRAST CT CERVICAL SPINE WITHOUT CONTRAST TECHNIQUE: Multidetector CT imaging of the head and cervical spine was performed following the standard protocol without intravenous contrast. Multiplanar CT image reconstructions of the cervical spine were also generated. RADIATION DOSE REDUCTION: This exam was performed according to the departmental dose-optimization program which includes automated exposure control, adjustment of the mA and/or kV according to patient size and/or use of iterative reconstruction technique. COMPARISON:  02/13/2021 and prior CTs FINDINGS: CT HEAD FINDINGS Brain: No evidence of acute infarction, hemorrhage, hydrocephalus, extra-axial collection or mass lesion/mass effect. Atrophy, chronic small-vessel white matter ischemic changes and  remote LEFT cerebellar infarct again noted. Vascular: Carotid atherosclerotic calcifications are noted. Skull: Normal. Negative for fracture or focal lesion. Sinuses/Orbits: No new abnormalities are identified. A small amount of fluid in the LEFT sphenoid sinus noted. Other: None. CT CERVICAL SPINE FINDINGS Alignment: 2 mm spondylolisthesis at C3-4 at C5-6 is unchanged. No acute subluxation noted. Skull base and vertebrae: No acute fracture. No primary bone lesion or focal pathologic process. Soft tissues and spinal canal: No prevertebral fluid or swelling. No visible canal hematoma. Disc levels: Multilevel degenerative disc disease, spondylosis and facet arthropathy again identified. Central spinal and bony foraminal narrowing is unchanged. Upper chest: No acute abnormality Other: None IMPRESSION: 1. No evidence of acute intracranial abnormality. Atrophy, chronic small-vessel white matter ischemic changes and remote LEFT cerebellar infarct. 2. No static evidence of acute injury to the cervical spine. Multilevel degenerative changes again noted. Electronically Signed   By: Margarette Canada M.D.   On: 07/10/2021 11:00    Procedures .Critical Care Performed by: Luna Fuse, MD Authorized by: Luna Fuse, MD   Critical care provider statement:    Critical care time (minutes):  40   Critical care time was exclusive of:  Separately billable procedures and treating other patients and teaching time   Critical care was necessary to treat or prevent imminent or life-threatening deterioration of the following conditions:  Trauma    Medications Ordered in ED Medications  potassium chloride SA (KLOR-CON M) CR tablet 40 mEq (has no administration in time range)    ED Course/ Medical Decision Making/ A&P                           Medical Decision Making Amount and/or Complexity of Data Reviewed Labs: ordered. Radiology: ordered.  Risk Prescription drug management.   Given fall on Eliquis with  baseline dementia, patient was a level 2 trauma activation.  History obtained from son at bedside.  Cardiac monitoring showing atrial fibrillation.  Review of record shows office visit Jun 16, 2021 for lymph nodes.  Trauma work-up included labs which were unremarkable potassium slightly low and repleted here in the ER.  CT imaging of the C-spine and head was unremarkable for acute pathology.  Son is at bedside, patient discharged into his care.  Advised immediate return for worsening symptoms persistent vomiting pain or any additional concerns.        Final Clinical Impression(s) / ED Diagnoses Final diagnoses:  Fall, initial encounter  Injury of head, initial encounter    Rx / DC Orders ED Discharge Orders     None         Luna Fuse, MD 07/10/21 1221    Luna Fuse, MD 07/10/21 [phone]

## 2021-07-10 NOTE — Progress Notes (Signed)
Orthopedic Tech Progress Note Patient Details:  Kathy Massey [birth year] [id number]   Level 2 trauma, ortho tech services were not needed at this time.  Patient ID: Kathy Massey, female   DOB: 1927-07-03, 88 y.o.   MRN: [id number]  [name] 07/10/2021, 11:09 AM

## 2021-07-10 NOTE — ED Notes (Signed)
Pt son taking her back to [place], discharge paperwork and other papers given to son and daughter in law

## 2021-07-10 NOTE — Discharge Instructions (Signed)
Your tests did not show any fracture or internal bleeding.  Follow-up with your doctor within the week.  Return back to the ER if you have worsening pain fevers or any additional concerns.

## 2021-07-10 NOTE — ED Notes (Signed)
Trauma Response Nurse Documentation   Kathy Massey is a 88 y.o. female arriving to [facility] ED via EMS  On Eliquis (apixaban) daily. Trauma was activated as a Level 2 by ED Charge RN based on the following trauma criteria Elderly patients > 65 with head trauma on anti-coagulation (excluding ASA). Trauma RN at the bedside on patient arrival. Patient cleared for CT by Dr. Almyra Free. Patient to CT with team. GCS 14.  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 EF > 55% by 06/16/15 echo.   ELBOW BURSA SURGERY Right 2016   EYE SURGERY     cataracts removed ,both eyes, /w IOL   INTRAMEDULLARY (IM) NAIL INTERTROCHANTERIC Right 02/14/2021   Procedure: INTRAMEDULLARY (IM) NAIL INTERTROCHANTRIC;  Surgeon: Vanetta Mulders, MD;  Location: [facility];  Service: Orthopedics;  Laterality: Right;   LYMPH NODE BIOPSY Left 01/14/2018   Procedure: EXCISION DEEP LEFT INGUINAL LYMPH NODE BIOPSY ERAS PATHWAY;  Surgeon: Fanny Skates, MD;  Location: [facility];  Service: General;  Laterality: Left;   TONSILLECTOMY         Initial Focused Assessment (If applicable, or please see trauma documentation): - GCS 14 (baseline ) - PERRLA - c/o no pain at the moment - A/Ox2 currently - a-fib on monitor  CT's Completed:   CT Head and CT C-Spine   Interventions:  - 20G PIV to L [facility] - Basic labs drawn - logrolled - CT head and neck - EKG  Plan for disposition:  Other Awaiting results from scans  Consults completed:  none at 1015.  Event Summary: Pt is from [facility] and was found in  the bed with her head in the opposite direction after having an unwitnessed fall.  When pt was found, She was slightly more altered than her baseline.  Pt currently denies pain.  Per EMS, pt has had another fall recently with c/o R hip pain but she did not hit head at that time.  Bedside handoff with ED RN Lysbeth Galas.    Clovis Cao  Trauma Response RN  Please call TRN at [phone] for further assistance.

## 2021-07-10 NOTE — ED Triage Notes (Addendum)
Pt bib [facility] from [facility] where she had an unwitnessed fall and was found in the bed in the opposite direction (feet at the head of bed). Pt found more altered than normal. Baseline AOx3 and had previous fall a couple weeks ago with some right hip pain. Pt is on eliquis for afib. Presents denying pain and AOx2 CBG 95, 99%RA, 82-94 HR afib, 126/82

## 2021-07-20 ENCOUNTER — Ambulatory Visit (HOSPITAL_BASED_OUTPATIENT_CLINIC_OR_DEPARTMENT_OTHER): Payer: Medicare PPO | Admitting: Orthopaedic Surgery

## 2021-12-16 ENCOUNTER — Inpatient Hospital Stay: Payer: Medicare PPO | Attending: Hematology & Oncology

## 2021-12-16 ENCOUNTER — Inpatient Hospital Stay: Payer: Medicare PPO | Admitting: Hematology & Oncology

## 2021-12-16 ENCOUNTER — Encounter: Payer: Self-pay | Admitting: Hematology & Oncology

## 2021-12-16 VITALS — BP 121/79 | HR 65 | Resp 17

## 2021-12-16 DIAGNOSIS — F0390 Unspecified dementia without behavioral disturbance: Secondary | ICD-10-CM | POA: Diagnosis not present

## 2021-12-16 DIAGNOSIS — C8583 Other specified types of non-Hodgkin lymphoma, intra-abdominal lymph nodes: Secondary | ICD-10-CM

## 2021-12-16 DIAGNOSIS — Z8572 Personal history of non-Hodgkin lymphomas: Secondary | ICD-10-CM | POA: Diagnosis present

## 2021-12-16 DIAGNOSIS — Z79899 Other long term (current) drug therapy: Secondary | ICD-10-CM | POA: Diagnosis not present

## 2021-12-16 DIAGNOSIS — M7989 Other specified soft tissue disorders: Secondary | ICD-10-CM | POA: Insufficient documentation

## 2021-12-16 DIAGNOSIS — C8280 Other types of follicular lymphoma, unspecified site: Secondary | ICD-10-CM | POA: Diagnosis not present

## 2021-12-16 LAB — CBC WITH DIFFERENTIAL (CANCER CENTER ONLY)
Abs Immature Granulocytes: 0.03 10*3/uL (ref 0.00–0.07)
Basophils Absolute: 0.1 10*3/uL (ref 0.0–0.1)
Basophils Relative: 1 %
Eosinophils Absolute: 0.1 10*3/uL (ref 0.0–0.5)
Eosinophils Relative: 2 %
HCT: 43.3 % (ref 36.0–46.0)
Hemoglobin: 13.6 g/dL (ref 12.0–15.0)
Immature Granulocytes: 0 %
Lymphocytes Relative: 15 %
Lymphs Abs: 1 10*3/uL (ref 0.7–4.0)
MCH: 30.1 pg (ref 26.0–34.0)
MCHC: 31.4 g/dL (ref 30.0–36.0)
MCV: 95.8 fL (ref 80.0–100.0)
Monocytes Absolute: 0.4 10*3/uL (ref 0.1–1.0)
Monocytes Relative: 5 %
Neutro Abs: 5.1 10*3/uL (ref 1.7–7.7)
Neutrophils Relative %: 77 %
Platelet Count: 327 10*3/uL (ref 150–400)
RBC: 4.52 MIL/uL (ref 3.87–5.11)
RDW: 14.1 % (ref 11.5–15.5)
WBC Count: 6.7 10*3/uL (ref 4.0–10.5)
nRBC: 0 % (ref 0.0–0.2)

## 2021-12-16 LAB — CMP (CANCER CENTER ONLY)
ALT: <5 U/L (ref 0–44)
AST: 20 U/L (ref 15–41)
Albumin: 4.3 g/dL (ref 3.5–5.0)
Alkaline Phosphatase: 220 U/L — ABNORMAL HIGH (ref 38–126)
Anion gap: 10 (ref 5–15)
BUN: 20 mg/dL (ref 8–23)
CO2: 30 mmol/L (ref 22–32)
Calcium: 9.8 mg/dL (ref 8.9–10.3)
Chloride: 101 mmol/L (ref 98–111)
Creatinine: 1.51 mg/dL — ABNORMAL HIGH (ref 0.44–1.00)
GFR, Estimated: 32 mL/min — ABNORMAL LOW (ref >60)
Glucose, Bld: 88 mg/dL (ref 70–99)
Potassium: 3.5 mmol/L (ref 3.5–5.1)
Sodium: 141 mmol/L (ref 135–145)
Total Bilirubin: 0.6 mg/dL (ref 0.3–1.2)
Total Protein: 7.4 g/dL (ref 6.5–8.1)

## 2021-12-16 LAB — SAVE SMEAR(SSMR), FOR PROVIDER SLIDE REVIEW

## 2021-12-16 LAB — LACTATE DEHYDROGENASE: LDH: 180 U/L (ref 98–192)

## 2021-12-16 NOTE — Progress Notes (Signed)
Hematology and Oncology Follow Up Visit  Kathy Massey [id number] [birth year] 88 y.o. 12/16/2021   Principle Diagnosis:  Recurrent small B-cell follicular NHL   Past Therapy:        Acalabrutinib 100 mg po q day - started on 02/25/2018 -- d/c on 10/22/2019  Current Therapy: Observation   Interim History:  [name] is here today with her daughter for follow-up.  Unfortunately, since we last saw her, she fell again.  She broke her right upper arm.  She cannot be put in a sling.  She did not require surgery.  She seems to be managing.  She is in the Memory Unit at [facility].  Her family really does help her out quite a bit.  Her dementia certainly is slowly and getting worse.  I am really not surprised by this.  Have I think she is eating okay.  She is having no problems with fever.  Thankfully, she has had no issues with COVID.  She has had no bleeding.  There is no obvious change in bowel or bladder habits.  There is been no problems with rashes.  She has little bit of leg swelling which I think is chronic.  Overall, I would say performance status is probably ECOG 3.     Medications:  Allergies as of 12/16/2021       Reactions   Shellfish Allergy Itching, Swelling   Shrimp        Medication List        Accurate as of December 16, 2021  4:09 PM. If you have any questions, ask your nurse or doctor.          amiodarone 200 MG tablet Commonly known as: PACERONE Take 0.5 tablets (100 mg total) by mouth daily.   atorvastatin 20 MG tablet Commonly known as: LIPITOR Take 20 mg by mouth at bedtime.   busPIRone 5 MG tablet Commonly known as: BUSPAR Take 1 tablet (5 mg total) by mouth 2 (two) times daily.   cyanocobalamin 1000 MCG tablet Take 1,000 mcg by mouth every morning.   docusate sodium 100 MG capsule Commonly known as: COLACE Take 1 capsule (100 mg total) by mouth 2 (two) times daily.   Eliquis 2.5 MG Tabs tablet Generic drug: apixaban TAKE 1 TABLET  BY MOUTH TWICE A DAY What changed: how much to take   escitalopram 5 MG tablet Commonly known as: LEXAPRO Take 5 mg by mouth every morning.   fluocinonide ointment 0.05 % Commonly known as: LIDEX Apply 1 application topically See admin instructions. Order date 02/02/21 - apply topically to scalp twice daily Monday thru Friday until healed   furosemide 40 MG tablet Commonly known as: LASIX Take 1 tablet (40 mg total) by mouth daily.   imiquimod 5 % cream Commonly known as: ALDARA Apply topically.   memantine 10 MG tablet Commonly known as: NAMENDA Take 10 mg by mouth 2 (two) times daily. For memory   methocarbamol 500 MG tablet Commonly known as: ROBAXIN Take 1 tablet (500 mg total) by mouth every 6 (six) hours as needed for muscle spasms.   midodrine 10 MG tablet Commonly known as: PROAMATINE Take 1 tablet (10 mg total) by mouth 3 (three) times daily with meals.   polyethylene glycol 17 g packet Commonly known as: MIRALAX / GLYCOLAX Take 17 g by mouth daily.   QUEtiapine 25 MG tablet Commonly known as: SEROQUEL Take 1 tablet (25 mg total) by mouth at bedtime.  Allergies:  Allergies  Allergen Reactions   Shellfish Allergy Itching and Swelling    Shrimp     Past Medical History, Surgical history, Social history, and Family History were reviewed and updated.  Review of Systems: Review of Systems  Constitutional: Negative.   HENT:  Positive for hearing loss.   Eyes:  Positive for blurred vision.  Respiratory: Negative.    Cardiovascular:  Positive for palpitations.  Gastrointestinal: Negative.   Genitourinary: Negative.   Musculoskeletal: Negative.   Skin: Negative.   Neurological:  Positive for dizziness.  Endo/Heme/Allergies: Negative.   Psychiatric/Behavioral: Negative.       Physical Exam:  blood pressure is 121/79 and her pulse is 65. Her respiration is 17 and oxygen saturation is 99%.   Wt Readings from Last 3 Encounters:  06/16/21 108  lb (49 kg)  04/15/21 111 lb 6.4 oz (50.5 kg)  02/16/21 119 lb 4.3 oz (54.1 kg)    Physical Exam Vitals reviewed.  HENT:     Head: Normocephalic and atraumatic.  Eyes:     Pupils: Pupils are equal, round, and reactive to light.  Cardiovascular:     Rate and Rhythm: Normal rate and regular rhythm.     Heart sounds: Normal heart sounds.     Comments: Cardiac exam shows irregular rate and irregular rhythm consistent with atrial fibrillation.  The rate is fairly well controlled.  She has no murmurs, rubs or bruits. Pulmonary:     Effort: Pulmonary effort is normal.     Breath sounds: Normal breath sounds.  Abdominal:     General: Bowel sounds are normal.     Palpations: Abdomen is soft.  Musculoskeletal:        General: No tenderness or deformity. Normal range of motion.     Cervical back: Normal range of motion.     Comments: Her extremities does show some nonpitting edema in the lower legs.  This is mild.  She has arthritic changes in her joints.  Lymphadenopathy:     Cervical: No cervical adenopathy.  Skin:    General: Skin is warm and dry.     Findings: No erythema or rash.  Neurological:     Mental Status: She is alert and oriented to person, place, and time.  Psychiatric:        Behavior: Behavior normal.        Thought Content: Thought content normal.        Judgment: Judgment normal.     Lab Results  Component Value Date   WBC 6.7 12/16/2021   HGB 13.6 12/16/2021   HCT 43.3 12/16/2021   MCV 95.8 12/16/2021   PLT 327 12/16/2021   Lab Results  Component Value Date   FERRITIN 99 02/15/2021   IRON 20 (L) 02/15/2021   TIBC 227 (L) 02/15/2021   UIBC 207 02/15/2021   IRONPCTSAT 9 (L) 02/15/2021   Lab Results  Component Value Date   RETICCTPCT 1.7 02/15/2021   RBC 4.52 12/16/2021   Lab Results  Component Value Date   KPAFRELGTCHN 11.9 05/19/2019   LAMBDASER 9.1 05/19/2019   KAPLAMBRATIO 1.31 05/19/2019   Lab Results  Component Value Date   IGGSERUM 573  (L) 05/19/2019   IGA 66 05/19/2019   IGMSERUM 37 05/19/2019   No results found for: "TOTALPROTELP", "ALBUMINELP", "A1GS", "A2GS", "BETS", "BETA2SER", "GAMS", "MSPIKE", "SPEI"   Chemistry      Component Value Date/Time   NA 141 12/16/2021 1510   NA 143 04/15/2021 1209   NA  144 10/17/2016 1000   K 3.5 12/16/2021 1510   K 4.5 10/17/2016 1000   CL 101 12/16/2021 1510   CL 106 10/17/2016 1000   CO2 30 12/16/2021 1510   CO2 30 10/17/2016 1000   BUN 20 12/16/2021 1510   BUN 16 04/15/2021 1209   BUN 18 10/17/2016 1000   CREATININE 1.51 (H) 12/16/2021 1510   CREATININE 1.6 (H) 10/17/2016 1000      Component Value Date/Time   CALCIUM 9.8 12/16/2021 1510   CALCIUM 9.4 10/17/2016 1000   ALKPHOS 220 (H) 12/16/2021 1510   ALKPHOS 101 (H) 10/17/2016 1000   AST 20 12/16/2021 1510   ALT <5 12/16/2021 1510   ALT 21 10/17/2016 1000   BILITOT 0.6 12/16/2021 1510       Impression and Plan: Ms. Reining is 88 yo white female with recurrent small B-cell follicular NHL. She was originally treated with systemic chemotherapy with Rituxan/Treanda followed by maintenance Rituxan completed in February 2018.  She was treated with acalabrutinib for recurrent disease.  She did well with this for a while and then began to have some toxicity so we stopped it.  We stopped it about a year and a half ago.  By her blood counts and blood smear, I do not see any problems with recurrence.  At this point, we will now let her "graduate" from from our practice.  I think it is more difficult to get her here.  Her dementia is clearly her defining prognosis.  I just wanted to have comfort, respect and, dignity.  I know her family will do a great job with all this.  It has been a true pleasure and privilege to have been able to help Ms. Griffeth.  She is a remarkable woman.  I have learned so much from her.  I will always remember her and always smile when I think of her.    [name], MD 11/3/20234:09 PM

## 2021-12-17 LAB — BETA 2 MICROGLOBULIN, SERUM: Beta-2 Microglobulin: 3.6 mg/L — ABNORMAL HIGH (ref 0.6–2.4)

## 2022-01-12 ENCOUNTER — Emergency Department (HOSPITAL_BASED_OUTPATIENT_CLINIC_OR_DEPARTMENT_OTHER)
Admission: EM | Admit: 2022-01-12 | Discharge: 2022-01-12 | Disposition: A | Discharge status: Home / Self Care | Payer: Medicare PPO | Attending: Emergency Medicine | Admitting: Emergency Medicine

## 2022-01-12 ENCOUNTER — Encounter (HOSPITAL_BASED_OUTPATIENT_CLINIC_OR_DEPARTMENT_OTHER): Payer: Self-pay

## 2022-01-12 ENCOUNTER — Emergency Department (HOSPITAL_BASED_OUTPATIENT_CLINIC_OR_DEPARTMENT_OTHER): Payer: Medicare PPO

## 2022-01-12 ENCOUNTER — Other Ambulatory Visit: Payer: Self-pay

## 2022-01-12 DIAGNOSIS — I509 Heart failure, unspecified: Secondary | ICD-10-CM | POA: Diagnosis not present

## 2022-01-12 DIAGNOSIS — S0990XA Unspecified injury of head, initial encounter: Secondary | ICD-10-CM | POA: Diagnosis present

## 2022-01-12 DIAGNOSIS — I4891 Unspecified atrial fibrillation: Secondary | ICD-10-CM | POA: Diagnosis not present

## 2022-01-12 DIAGNOSIS — S0101XA Laceration without foreign body of scalp, initial encounter: Secondary | ICD-10-CM | POA: Insufficient documentation

## 2022-01-12 DIAGNOSIS — I130 Hypertensive heart and chronic kidney disease with heart failure and stage 1 through stage 4 chronic kidney disease, or unspecified chronic kidney disease: Secondary | ICD-10-CM | POA: Diagnosis not present

## 2022-01-12 DIAGNOSIS — W19XXXA Unspecified fall, initial encounter: Secondary | ICD-10-CM | POA: Diagnosis not present

## 2022-01-12 DIAGNOSIS — Z7901 Long term (current) use of anticoagulants: Secondary | ICD-10-CM | POA: Diagnosis not present

## 2022-01-12 DIAGNOSIS — N1831 Chronic kidney disease, stage 3a: Secondary | ICD-10-CM | POA: Insufficient documentation

## 2022-01-12 DIAGNOSIS — F0390 Unspecified dementia without behavioral disturbance: Secondary | ICD-10-CM | POA: Diagnosis not present

## 2022-01-12 MED ORDER — TETANUS-DIPHTH-ACELL PERTUSSIS 5-2.5-18.5 LF-MCG/0.5 IM SUSY
0.5000 mL | PREFILLED_SYRINGE | Freq: Once | INTRAMUSCULAR | Status: DC
Start: 2022-01-12 — End: 2022-01-12
  Filled 2022-01-12: qty 0.5

## 2022-01-12 MED ORDER — ACETAMINOPHEN 500 MG PO TABS
1000.0000 mg | ORAL_TABLET | Freq: Once | ORAL | Status: AC
  Administered 2022-01-12: 1000 mg via ORAL
  Filled 2022-01-12: qty 2

## 2022-01-12 MED ORDER — LIDOCAINE HCL (PF) 1 % IJ SOLN
5.0000 mL | Freq: Once | INTRAMUSCULAR | Status: AC
  Administered 2022-01-12: 5 mL via INTRADERMAL
  Filled 2022-01-12: qty 5

## 2022-01-12 NOTE — ED Notes (Signed)
Family updated as to patient's status.

## 2022-01-12 NOTE — ED Notes (Signed)
EDP at bedside of Pt. With assistance of RN and Carleene Overlie EMT

## 2022-01-12 NOTE — ED Notes (Signed)
History Of A Fib

## 2022-01-12 NOTE — Discharge Instructions (Signed)
We evaluated your mother for her fall.  Her head CT scan and her neck CT scan were negative for any bleeding or fracture.  The rest of her exam did not show signs of any injury.  We repaired her scalp laceration with staples.  She should have these removed in about 10 to 14 days.  Please ask the facility to keep a close eye on the wound to look for signs of infection such as redness, warmth, drainage of pus, fevers or any other symptoms.

## 2022-01-12 NOTE — ED Notes (Signed)
Pt. Has a noted laceration on the back side of the skull.

## 2022-01-12 NOTE — ED Notes (Signed)
ED Provider at bedside. 

## 2022-01-12 NOTE — ED Triage Notes (Signed)
Pt from [place] for eval of fall this AM. Pt is on eliquis, compliant. Per son, pt at baseline. Pt's head bandaged PTA, bleeding controlled

## 2022-01-12 NOTE — ED Provider Notes (Signed)
[facility] Provider Note  CSN: [id number] Arrival date & time: 01/12/22 1057  Chief Complaint(s) Fall  HPI Kathy Massey is a 88 y.o. female with history of A-fib on Eliquis, CHF, dementia, CKD presenting with fall.  Patient fell in her room at her assisted living facility yesterday.  Patient has dementia and does not remember the details of the fall.  Family reports that assisted living called him this morning and said she needs to go to the hospital for evaluation.  Patient denies any complaints currently.  Family report that the patient has frequent falls, recently broke right arm and has been unable to use walker to ambulate.  Sometimes will still get up but now has leg weakness from deconditioning and will fall.  History limited due to dementia.   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 Authorizing Provider  amiodarone (PACERONE) 200 MG tablet Take 0.5 tablets (100 mg total) by mouth daily. 04/15/21   Shirley Friar, PA-C  apixaban (ELIQUIS) 2.5 MG TABS tablet TAKE 1 TABLET BY MOUTH TWICE A DAY Patient taking differently: Take 2.5 mg by mouth 2 (two) times daily. 10/26/20   Camnitz, Ocie Doyne, MD  atorvastatin (LIPITOR) 20 MG tablet Take 20 mg by mouth at bedtime. Patient not taking: Reported on 04/15/2021    [provider]  busPIRone (BUSPAR) 5 MG tablet Take 1 tablet (5 mg total) by mouth 2 (two) times daily. Patient not taking: Reported on 04/15/2021 02/24/21   Aline August, MD  cyanocobalamin 1000 MCG tablet Take 1,000 mcg by mouth every morning.    [provider]  docusate sodium (COLACE) 100 MG capsule Take 1 capsule (100 mg total) by mouth 2 (two) times daily. 02/24/21   Aline August, MD  escitalopram (LEXAPRO) 5 MG tablet Take 5 mg by mouth every morning. 09/06/20   [provider]  fluocinonide ointment (LIDEX) 7.82 % Apply 1 application topically See admin instructions. Order date 02/02/21 - apply topically to scalp twice daily Monday thru Friday until healed Patient not taking: Reported on 04/15/2021    [provider]  furosemide (LASIX) 40 MG tablet Take 1  tablet (40 mg total) by mouth daily. 02/24/21   Aline August, MD  imiquimod [place]) 5 % cream Apply topically. 05/26/21   [provider]  memantine (NAMENDA) 10 MG tablet Take 10 mg by mouth 2 (two) times daily. For memory Patient not taking: Reported on 04/15/2021 11/11/20   [provider]  methocarbamol (ROBAXIN) 500 MG tablet Take 1 tablet (500 mg total) by mouth every 6 (six) hours as  needed for muscle spasms. 02/24/21   Aline August, MD  midodrine (PROAMATINE) 10 MG tablet Take 1 tablet (10 mg total) by mouth 3 (three) times daily with meals. 02/24/21   Aline August, MD  polyethylene glycol (MIRALAX / GLYCOLAX) 17 g packet Take 17 g by mouth daily. 02/24/21   Aline August, MD  QUEtiapine (SEROQUEL) 25 MG tablet Take 1 tablet (25 mg total) by mouth at bedtime. 02/24/21   Aline August, MD                                                                                                                                    Past Surgical History Past Surgical History:  Procedure Laterality Date   CARDIAC CATHETERIZATION     06/17/15 [facility]): No CAD, LVEDP 9 mmHg, borderline LV contraction with apical akinesis, suggestive of stress-induced CM. EF > 55% by 06/16/15 echo.   ELBOW BURSA SURGERY Right 2016   EYE SURGERY     cataracts removed ,both eyes, /w IOL   INTRAMEDULLARY (IM) NAIL INTERTROCHANTERIC Right 02/14/2021   Procedure: INTRAMEDULLARY (IM) NAIL INTERTROCHANTRIC;  Surgeon: Vanetta Mulders, MD;  Location: [place] [facility];  Service: Orthopedics;  Laterality: Right;   LYMPH NODE BIOPSY Left 01/14/2018   Procedure: EXCISION DEEP LEFT INGUINAL LYMPH NODE BIOPSY ERAS PATHWAY;  Surgeon: Fanny Skates, MD;  Location: [place] [facility];  Service: General;  Laterality: Left;   TONSILLECTOMY     Family History Family History  Problem Relation Age of Onset   Heart disease Mother    Depression Mother    Heart failure Mother    Heart disease Father    Alcohol abuse Father    Heart attack Father    Depression Father    Multiple myeloma Maternal Grandmother    Alcohol abuse Maternal Grandfather    Breast cancer Paternal Grandmother    Breast cancer Daughter     Social History Social History   Tobacco Use   Smoking status: Never   Smokeless tobacco: Never   Tobacco comments:    never used tobacco  Vaping Use   Vaping Use: Never used  Substance Use Topics   Alcohol use: Yes    Alcohol/week:  1.0 standard drink of alcohol    Types: 1 Glasses of wine per week   Drug use: No   Allergies Shellfish allergy  Review of Systems Review of Systems  All other systems reviewed and are negative.   Physical Exam Vital Signs  I have reviewed  the triage vital signs BP 115/71   Pulse 67   Temp (!) 96.8 F (36 C)   Resp (!) 23   SpO2 98%  Physical Exam Vitals and nursing note reviewed.  Constitutional:      General: She is not in acute distress.    Appearance: She is well-developed.  HENT:     Head: Normocephalic.     Comments: 1cm laceration to posterior scalp, bleeding controlled, galea laceration present    Mouth/Throat:     Mouth: Mucous membranes are moist.  Eyes:     Pupils: Pupils are equal, round, and reactive to light.  Cardiovascular:     Rate and Rhythm: Normal rate and regular rhythm.     Heart sounds: No murmur heard. Pulmonary:     Effort: Pulmonary effort is normal. No respiratory distress.     Breath sounds: Normal breath sounds.  Abdominal:     General: Abdomen is flat.     Palpations: Abdomen is soft.     Tenderness: There is no abdominal tenderness.  Musculoskeletal:        General: No tenderness.     Right lower leg: No edema.     Left lower leg: No edema.     Comments: No midline C, T, L-spine tenderness.  No chest wall tenderness or crepitus.  Full painless range of motion at the bilateral upper extremities including the shoulders, elbows, wrists, hand and fingers, and in the bilateral lower extremities including the hips, knees, ankle, toes.  No focal bony tenderness, injury or deformity.  Skin:    General: Skin is warm and dry.  Neurological:     General: No focal deficit present.     Mental Status: She is alert. Mental status is at baseline.  Psychiatric:        Mood and Affect: Mood normal.        Behavior: Behavior normal.     ED Results and Treatments Labs (all labs ordered are listed, but only abnormal results are displayed) Labs  Reviewed - No data to display                                                                                                                        Radiology CT Head Wo Contrast  Result Date: 01/12/2022 CLINICAL DATA:  Head trauma, moderate-severe; Neck trauma (Age >= 65y). Fall, chronic anticoagulation. EXAM: CT HEAD WITHOUT CONTRAST CT CERVICAL SPINE WITHOUT CONTRAST TECHNIQUE: Multidetector CT imaging of the head and cervical spine was performed following the standard protocol without intravenous contrast. Multiplanar CT image reconstructions of the cervical spine were also generated. RADIATION DOSE REDUCTION: This exam was performed according to the departmental dose-optimization program which includes automated exposure control, adjustment of the mA and/or kV according to patient size and/or use of iterative reconstruction technique. COMPARISON:  11/06/2021 FINDINGS: CT HEAD FINDINGS Brain: Normal anatomic configuration. Moderate to severe parenchymal volume loss is stable. Mild periventricular white matter changes are present likely reflecting the  sequela of small vessel ischemia. No abnormal intra or extra-axial mass lesion or fluid collection. No abnormal mass effect or midline shift. No evidence of acute intracranial hemorrhage or infarct. Mild ventriculomegaly is commensurate with the degree of parenchymal volume loss and likely represents ex vacuo dilation. Cerebellum unremarkable. Vascular: No asymmetric hyperdense vasculature at the skull base. Skull: Intact Sinuses/Orbits: Paranasal sinuses are clear. Orbits are unremarkable. Other: Mastoid air cells and middle ear cavities are clear. Soft tissue defect involving the right high frontal scalp noted. CT CERVICAL SPINE FINDINGS Alignment: 2-3 mm anterolisthesis C2-3 and C5-6 and retrolisthesis C3-4 are likely degenerative in nature. Otherwise normal cervical lordosis. Skull base and vertebrae: Craniocervical alignment is normal. The atlantodental  interval is not widened. No acute fracture of the cervical spine. Vertebral body height is preserved. Soft tissues and spinal canal: No prevertebral fluid or swelling. No visible canal hematoma. Disc levels: There is intervertebral disc space narrowing and endplate remodeling throughout the cervical spine, most severe at C6-7 in keeping with changes of moderate to severe degenerative disc disease. Prevertebral soft tissues are not thickened on sagittal reformats. No high-grade canal stenosis. Multilevel uncovertebral and facet arthrosis results in multilevel neuroforaminal narrowing, most severe bilaterally, right greater than left, at C2-3, C3-4, on the right at C4-5, and on the left at C6-7 Upper chest: Negative. Other: None IMPRESSION: 1. No acute intracranial abnormality. No calvarial fracture. Right high frontal scalp soft tissue defect. 2. No acute fracture or listhesis of the cervical spine. 3. Multilevel degenerative disc and degenerative joint disease resulting in multilevel neuroforaminal narrowing as outlined above. Electronically Signed   By: Fidela Salisbury M.D.   On: 01/12/2022 11:57   CT Cervical Spine Wo Contrast  Result Date: 01/12/2022 CLINICAL DATA:  Head trauma, moderate-severe; Neck trauma (Age >= 65y). Fall, chronic anticoagulation. EXAM: CT HEAD WITHOUT CONTRAST CT CERVICAL SPINE WITHOUT CONTRAST TECHNIQUE: Multidetector CT imaging of the head and cervical spine was performed following the standard protocol without intravenous contrast. Multiplanar CT image reconstructions of the cervical spine were also generated. RADIATION DOSE REDUCTION: This exam was performed according to the departmental dose-optimization program which includes automated exposure control, adjustment of the mA and/or kV according to patient size and/or use of iterative reconstruction technique. COMPARISON:  11/06/2021 FINDINGS: CT HEAD FINDINGS Brain: Normal anatomic configuration. Moderate to severe parenchymal  volume loss is stable. Mild periventricular white matter changes are present likely reflecting the sequela of small vessel ischemia. No abnormal intra or extra-axial mass lesion or fluid collection. No abnormal mass effect or midline shift. No evidence of acute intracranial hemorrhage or infarct. Mild ventriculomegaly is commensurate with the degree of parenchymal volume loss and likely represents ex vacuo dilation. Cerebellum unremarkable. Vascular: No asymmetric hyperdense vasculature at the skull base. Skull: Intact Sinuses/Orbits: Paranasal sinuses are clear. Orbits are unremarkable. Other: Mastoid air cells and middle ear cavities are clear. Soft tissue defect involving the right high frontal scalp noted. CT CERVICAL SPINE FINDINGS Alignment: 2-3 mm anterolisthesis C2-3 and C5-6 and retrolisthesis C3-4 are likely degenerative in nature. Otherwise normal cervical lordosis. Skull base and vertebrae: Craniocervical alignment is normal. The atlantodental interval is not widened. No acute fracture of the cervical spine. Vertebral body height is preserved. Soft tissues and spinal canal: No prevertebral fluid or swelling. No visible canal hematoma. Disc levels: There is intervertebral disc space narrowing and endplate remodeling throughout the cervical spine, most severe at C6-7 in keeping with changes of moderate to severe degenerative disc disease. Prevertebral soft tissues  are not thickened on sagittal reformats. No high-grade canal stenosis. Multilevel uncovertebral and facet arthrosis results in multilevel neuroforaminal narrowing, most severe bilaterally, right greater than left, at C2-3, C3-4, on the right at C4-5, and on the left at C6-7 Upper chest: Negative. Other: None IMPRESSION: 1. No acute intracranial abnormality. No calvarial fracture. Right high frontal scalp soft tissue defect. 2. No acute fracture or listhesis of the cervical spine. 3. Multilevel degenerative disc and degenerative joint disease  resulting in multilevel neuroforaminal narrowing as outlined above. Electronically Signed   By: Fidela Salisbury M.D.   On: 01/12/2022 11:57    Pertinent labs & imaging results that were available during my care of the patient were reviewed by me and considered in my medical decision making (see MDM for details).  Medications Ordered in ED Medications  Tdap (BOOSTRIX) injection 0.5 mL (has no administration in time range)  acetaminophen (TYLENOL) tablet 1,000 mg (has no administration in time range)  lidocaine (PF) (XYLOCAINE) 1 % injection 5 mL (5 mLs Intradermal Given 01/12/22 1239)                                                                                                                                     Procedures .Marland KitchenLaceration Repair  Date/Time: 01/12/2022 12:37 PM  Performed by: Cristie Hem, MD Authorized by: Cristie Hem, MD   Consent:    Consent obtained:  Verbal   Consent given by:  Patient and healthcare agent   Risks, benefits, and alternatives were discussed: yes     Risks discussed:  Infection, retained foreign body, need for additional repair, nerve damage, poor wound healing, poor cosmetic result, pain, tendon damage and vascular damage   Alternatives discussed:  No treatment Universal protocol:    Procedure explained and questions answered to patient or proxy's satisfaction: yes     Patient identity confirmed:  Verbally with patient and arm band Anesthesia:    Anesthesia method:  Local infiltration   Local anesthetic:  Lidocaine 1% w/o epi Laceration details:    Location:  Scalp   Scalp location:  L parietal   Length (cm):  3 Exploration:    Wound exploration: wound explored through full range of motion     Wound extent comment:  Small galea defect Treatment:    Area cleansed with:  Saline   Amount of cleaning:  Extensive   Irrigation solution:  Sterile saline   Irrigation method:  Syringe   Visualized foreign bodies/material removed: no      Debridement:  None   Undermining:  None   Scar revision: no     Layers/structures repaired:  Janae Sauce:    Suture size:  3-0   Suture material:  Vicryl   Suture technique:  Simple interrupted   Number of sutures:  1 Skin repair:    Repair method:  Staples   Number of staples:  4 Approximation:  Approximation:  Close Repair type:    Repair type:  Complex Post-procedure details:    Procedure completion:  Tolerated well, no immediate complications   (including critical care time)  Medical Decision Making / ED Course   MDM:  88 year old female presenting after fall.  Patient overall well-appearing, vital signs reassuring.  On my interpretation CT head negative.  Will await radiology interpretation.  Patient has reportedly had frequent falls recently.  She has a small wound on the back of her head which I will repair.  Will repair galea as well.  No sign of other trauma from the fall on physical exam.  If CT head and CT neck are negative likely discharge after repair of wound.      Additional history obtained: -Additional history obtained from family -External records from outside source obtained and reviewed including: Chart review including previous notes, labs, imaging, consultation notes including ED visit 11/06/21  Imaging Studies ordered: I ordered imaging studies including CT head and neck On my interpretation imaging demonstrates No acute intracranial bleeding or fracture I independently visualized and interpreted imaging. I agree with the radiologist interpretation   Medicines ordered and prescription drug management: Meds ordered this encounter  Medications   lidocaine (PF) (XYLOCAINE) 1 % injection 5 mL   Tdap (BOOSTRIX) injection 0.5 mL   acetaminophen (TYLENOL) tablet 1,000 mg    -I have reviewed the patients home medicines and have made adjustments as needed   Social Determinants of Health:  Diagnosis or treatment significantly limited by social  determinants of health: dementia   Reevaluation: After the interventions noted above, I reevaluated the patient and found that they have improved  Co morbidities that complicate the patient evaluation  Past Medical History:  Diagnosis Date   Arthritis    hands -oa   Dysrhythmia    afib   Heart murmur    Hypertension    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    Final Clinical Impression(s) / ED Diagnoses Final diagnoses:  Laceration of scalp, initial encounter     This chart was dictated using voice recognition software.  Despite best efforts to proofread,  errors can occur which can change the documentation meaning.    Cristie Hem, MD 01/12/22 1240

## 2022-01-12 NOTE — ED Notes (Signed)
Pt. Reports to son and son reports to RN that the Pt. Fell this morning the Pt. Fell on her back side of head while trying to use W/C and has weak arm from a previous break and also weak hip from past history.   No other injuries.

## 2022-01-17 ENCOUNTER — Emergency Department (HOSPITAL_BASED_OUTPATIENT_CLINIC_OR_DEPARTMENT_OTHER)
Admission: EM | Admit: 2022-01-17 | Discharge: 2022-01-18 | Disposition: A | Discharge status: Home / Self Care | Payer: Medicare PPO | Attending: Emergency Medicine | Admitting: Emergency Medicine

## 2022-01-17 ENCOUNTER — Other Ambulatory Visit: Payer: Self-pay

## 2022-01-17 ENCOUNTER — Emergency Department (HOSPITAL_BASED_OUTPATIENT_CLINIC_OR_DEPARTMENT_OTHER): Payer: Medicare PPO

## 2022-01-17 DIAGNOSIS — W19XXXA Unspecified fall, initial encounter: Secondary | ICD-10-CM | POA: Insufficient documentation

## 2022-01-17 DIAGNOSIS — I10 Essential (primary) hypertension: Secondary | ICD-10-CM | POA: Insufficient documentation

## 2022-01-17 DIAGNOSIS — Z79899 Other long term (current) drug therapy: Secondary | ICD-10-CM | POA: Insufficient documentation

## 2022-01-17 DIAGNOSIS — S0181XA Laceration without foreign body of other part of head, initial encounter: Secondary | ICD-10-CM | POA: Insufficient documentation

## 2022-01-17 NOTE — ED Triage Notes (Signed)
Pt from [place] fell this evening and has bump on forehead. Pt was here last week after falling and has staples in back of head. Pt is on blood thinners. Unknown if pt was down for any amount of time. Pt daughter states they just called and told us she fell and not sure what happened and if she had been down on the ground long.

## 2022-01-18 NOTE — ED Provider Notes (Signed)
[facility] Provider Note   CSN: [id number] Arrival date & time: 01/17/22  2105     History  Chief Complaint  Patient presents with   Fall   Head Injury    Kathy Massey is a 88 y.o. female.  The history is provided by a relative. The history is limited by the condition of the patient (level 5).  Fall This is a new problem. The current episode started 1 to 2 hours ago. The problem occurs constantly. The problem has been resolved. Nothing aggravates the symptoms. Nothing relieves the symptoms. She has tried nothing for the symptoms. The treatment provided no relief.  Head Injury Fell on eliquis.      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 Authorizing Provider  amiodarone (PACERONE) 200 MG tablet Take 0.5 tablets (100 mg total) by mouth daily. 04/15/21   Shirley Friar, PA-C  apixaban (ELIQUIS) 2.5 MG TABS tablet TAKE 1 TABLET BY MOUTH TWICE A DAY Patient taking differently: Take 2.5 mg by mouth 2 (two) times daily. 10/26/20   Camnitz, Ocie Doyne, MD  atorvastatin (LIPITOR) 20 MG tablet Take 20 mg by mouth at bedtime. Patient not taking: Reported on 04/15/2021    [provider]  busPIRone (BUSPAR) 5 MG tablet Take 1 tablet (5 mg total) by mouth 2 (two) times daily. Patient not taking: Reported on 04/15/2021 02/24/21   Aline August, MD  cyanocobalamin 1000 MCG tablet Take 1,000 mcg by mouth every morning.    [provider]  docusate sodium (COLACE) 100 MG capsule Take 1 capsule (100 mg total) by mouth 2 (two) times daily. 02/24/21   Aline August, MD  escitalopram (LEXAPRO) 5 MG tablet Take 5 mg by  mouth every morning. 09/06/20   [provider]  fluocinonide ointment (LIDEX) 5.78 % Apply 1 application topically See admin instructions. Order date 02/02/21 - apply topically to scalp twice daily Monday thru Friday until healed Patient not taking: Reported on 04/15/2021    [provider]  furosemide (LASIX) 40 MG tablet Take 1 tablet (40 mg total) by mouth daily. 02/24/21   Aline August, MD  imiquimod [place]) 5 % cream Apply topically. 05/26/21   [provider]  memantine (NAMENDA) 10 MG tablet Take 10 mg by mouth 2 (two) times daily. For memory Patient not taking: Reported on 04/15/2021 11/11/20   [provider]  methocarbamol (ROBAXIN) 500 MG tablet Take 1 tablet (500 mg total) by mouth every 6 (six) hours as needed for muscle spasms. 02/24/21   Aline August, MD  midodrine (PROAMATINE) 10 MG tablet Take 1 tablet (10 mg total) by mouth 3 (three) times daily with meals. 02/24/21   Aline August, MD  polyethylene glycol (MIRALAX / GLYCOLAX) 17 g packet Take 17 g by mouth daily. 02/24/21   Aline August, MD  QUEtiapine (SEROQUEL) 25 MG tablet Take 1 tablet (25 mg total) by mouth at bedtime. 02/24/21   Aline August, MD      Allergies    Shellfish allergy    Review of Systems   Review of Systems  Constitutional:  Negative  for fever.  HENT:  Negative for congestion.   Eyes:  Negative for redness.  Respiratory:  Negative for wheezing and stridor.   All other systems reviewed and are negative.   Physical Exam Updated Vital Signs BP (!) 153/98 (BP Location: Left Arm)   Pulse (!) 102   Temp 98 F (36.7 C)   Resp 20   Ht '5\' 2"'$  (1.575 m)   Wt 49.9 kg   SpO2 97%   BMI 20.12 kg/m  Physical Exam Vitals and nursing note reviewed.  Constitutional:      General: She is not in acute distress.    Appearance: She is well-developed.  HENT:     Head: Normocephalic.      Nose: Nose normal.     Mouth/Throat:     Mouth: Mucous membranes are moist.      Pharynx: Oropharynx is clear.  Eyes:     Pupils: Pupils are equal, round, and reactive to light.  Cardiovascular:     Rate and Rhythm: Normal rate and regular rhythm.     Pulses: Normal pulses.     Heart sounds: Normal heart sounds.  Pulmonary:     Effort: Pulmonary effort is normal. No respiratory distress.     Breath sounds: Normal breath sounds.  Abdominal:     General: Bowel sounds are normal. There is no distension.     Palpations: Abdomen is soft.     Tenderness: There is no abdominal tenderness. There is no guarding or rebound.  Genitourinary:    Vagina: No vaginal discharge.  Musculoskeletal:        General: Normal range of motion.     Cervical back: Normal, normal range of motion and neck supple. No rigidity or tenderness.     Thoracic back: Normal.  Skin:    General: Skin is warm and dry.     Capillary Refill: Capillary refill takes less than 2 seconds.     Findings: No erythema or rash.  Neurological:     General: No focal deficit present.     Deep Tendon Reflexes: Reflexes normal.  Psychiatric:        Mood and Affect: Mood normal.     ED Results / Procedures / Treatments   Labs (all labs ordered are listed, but only abnormal results are displayed) Labs Reviewed - No data to display  EKG None  Radiology CT Head Wo Contrast  Result Date: 01/17/2022 CLINICAL DATA:  Head trauma, minor (Age >= 65y) fall on blood thinners EXAM: CT HEAD WITHOUT CONTRAST TECHNIQUE: Contiguous axial images were obtained from the base of the skull through the vertex without intravenous contrast. RADIATION DOSE REDUCTION: This exam was performed according to the departmental dose-optimization program which includes automated exposure control, adjustment of the mA and/or kV according to patient size and/or use of iterative reconstruction technique. COMPARISON:  None Available. BRAIN: BRAIN Cerebral ventricle sizes are concordant with the degree of cerebral volume loss. Patchy and confluent  areas of decreased attenuation are noted throughout the deep and periventricular white matter of the cerebral hemispheres bilaterally, compatible with chronic microvascular ischemic disease. No evidence of large-territorial acute infarction. No parenchymal hemorrhage. No mass lesion. No extra-axial collection. No mass effect or midline shift. No hydrocephalus. Cavum septum pellucidum variant noted. Basilar cisterns are patent. Vascular: No hyperdense vessel. Skull: No acute fracture or focal lesion. Sinuses/Orbits: Paranasal sinuses and mastoid air cells are clear. Bilateral lens replacement. Otherwise the orbits are unremarkable. Other: Trace right frontal scalp hematoma. Left  occipital scalp sutures. IMPRESSION: No acute intracranial abnormality. Electronically Signed   By: Iven Finn M.D.   On: 01/17/2022 22:42    Procedures .Marland KitchenLaceration Repair  Date/Time: 01/18/2022 12:19 AM  Performed by: Veatrice Kells, MD Authorized by: Veatrice Kells, MD   Consent:    Consent obtained:  Verbal   Consent given by:  Patient   Risks discussed:  Infection, need for additional repair and nerve damage Universal protocol:    Patient identity confirmed:  Arm band Anesthesia:    Anesthesia method:  None Laceration details:    Location:  Face   Face location:  Forehead   Length (cm):  0.9   Depth (mm):  0.5 Pre-procedure details:    Preparation:  Patient was prepped and draped in usual sterile fashion Exploration:    Hemostasis achieved with:  Direct pressure   Wound extent: fascia not violated and no foreign body     Contaminated: no   Treatment:    Area cleansed with:  Povidone-iodine and chlorhexidine   Amount of cleaning:  Extensive   Debridement:  None   Undermining:  None   Scar revision: no   Skin repair:    Repair method:  Tissue adhesive Approximation:    Approximation:  Close Repair type:    Repair type:  Simple Post-procedure details:    Dressing:  Open (no dressing)   Procedure  completion:  Tolerated well, no immediate complications     Medications Ordered in ED Medications - No data to display  ED Course/ Medical Decision Making/ A&P                           Medical Decision Making Patient fell at nursing home was not able to use walker secondary to previous arm injury   Amount and/or Complexity of Data Reviewed Independent Historian:     Details: Family member see above  External Data Reviewed: notes.    Details: Preevious notes reviewed  Radiology: ordered and independent interpretation performed.    Details: Negative head CT   Risk Risk Details: Patient's wound was closed.  Well appearing.  No neck tenderness.  Normal head CT stable for discharge.  Strict return.      Final Clinical Impression(s) / ED Diagnoses Final diagnoses:  Fall in home, initial encounter  Laceration of forehead, initial encounter   Return for intractable cough, coughing up blood, fevers > 100.4 unrelieved by medication, shortness of breath, intractable vomiting, chest pain, shortness of breath, weakness, numbness, changes in speech, facial asymmetry, abdominal pain, passing out, Inability to tolerate liquids or food, cough, altered mental status or any concerns. No signs of systemic illness or infection. The patient is nontoxic-appearing on exam and vital signs are within normal limits.  I have reviewed the triage vital signs and the nursing notes. Pertinent labs & imaging results that were available during my care of the patient were reviewed by me and considered in my medical decision making (see chart for details). After history, exam, and medical workup I feel the patient has been appropriately medically screened and is safe for discharge home. Pertinent diagnoses were discussed with the patient. Patient was given return precautions.    Rx / DC Orders ED Discharge Orders     None         Flynt Breeze, MD 01/18/22 0021

## 2022-03-22 ENCOUNTER — Telehealth: Payer: Self-pay | Admitting: Family Medicine

## 2022-03-22 NOTE — Telephone Encounter (Signed)
TCT patient's daughter [name] to follow up as pt was last seen by Dr Morton Peters in 03/2021 when she was in rehab at [facility]. Daughter states not remembering discussing with Dr Mariea Clonts about her mother's care but this was the time that she had fallen and had a hip fracture.  States her mother's Alzheimer's is pretty advanced and she has severe short term memory loss.  She has taken pt to [facility] but currently pt is in a memory care unit at [facility] and states she doesn't know what Palliative could offer that she is not receiving there.  She states pt has had 2 falls in the last year and fractured a hip and arm.  She is seen by Dr Burney Gauze for hx of NHL but is no longer receiving treatment.  She also follows up with Cardiology.  She states that there are no significant plans for any aggressive intervention as her mother's quality of life is poor with pt exhibiting some depression, wanting to go home. She states private paying for her care and wishing she had someone to come visit her for 30 minutes or a little more every so often.  She has some guilt feelings for but cannot afford additional services and her mother doesn't want someone to stay for a couple of hours. She states that the staff there have trouble with bathing her mom because does not want to bathe.  Pt makes too much money or has too much income to qualify for Medicaid.  Advised that Palliative has an NP that would visit her mom monthly and help with symptom management, works to coordinate between PCP and specialist for pt's improved management, offer SW support and after hours triage.  She states she does not see an immediate benefit that is not covered by the staff at the memory care unit.  Advised if pt has a fall and fracture to contact us back or if she continues to decline then she may qualify for Hospice support and the family can call to request a Hospice Evaluation.  In looking at recent weight pt has gained a little in the last few months.   She states her mom can ambulate with a walker or at times uses a WC.  She will forget that she is capable of walking at times and will sometimes attempt walking without her walker.  Advised of increased frequency of visits with Hospice and this is due to decline in function with HHA services for bathing/personal care, nurse visits 2 times weekly along with SW to aid with coping and family support.  Also advised that after her mother passes there is a 13 month grief counseling benefit open to the family.  She was given my phone number to contact for any issues if she wants Palliative Care to follow up or if she has a wish to follow up to see if Hospice appropriate.  Daughter is private paying for facility.  Will have open case closed for now at daughter's request.  Damaris Hippo FNP-C

## 2022-04-14 ENCOUNTER — Emergency Department (HOSPITAL_COMMUNITY): Payer: Medicare PPO

## 2022-04-14 ENCOUNTER — Inpatient Hospital Stay (HOSPITAL_COMMUNITY): Payer: Medicare PPO | Admitting: Anesthesiology

## 2022-04-14 ENCOUNTER — Encounter (HOSPITAL_COMMUNITY)
Admission: EM | Disposition: A | Discharge status: Skilled Nursing Facility | Payer: Self-pay | Source: Skilled Nursing Facility | Attending: Student

## 2022-04-14 ENCOUNTER — Inpatient Hospital Stay (HOSPITAL_COMMUNITY)
Admission: EM | Admit: 2022-04-14 | Discharge: 2022-04-18 | DRG: 481 | Disposition: A | Discharge status: Skilled Nursing Facility | Payer: Medicare PPO | Source: Skilled Nursing Facility | Attending: Student | Admitting: Student

## 2022-04-14 ENCOUNTER — Inpatient Hospital Stay (HOSPITAL_COMMUNITY): Payer: Medicare PPO | Admitting: Certified Registered"

## 2022-04-14 ENCOUNTER — Other Ambulatory Visit: Payer: Self-pay

## 2022-04-14 ENCOUNTER — Inpatient Hospital Stay (HOSPITAL_COMMUNITY): Payer: Medicare PPO

## 2022-04-14 ENCOUNTER — Encounter (HOSPITAL_COMMUNITY): Payer: Self-pay | Admitting: Emergency Medicine

## 2022-04-14 DIAGNOSIS — W1839XA Other fall on same level, initial encounter: Secondary | ICD-10-CM | POA: Diagnosis present

## 2022-04-14 DIAGNOSIS — I4819 Other persistent atrial fibrillation: Secondary | ICD-10-CM | POA: Diagnosis not present

## 2022-04-14 DIAGNOSIS — Z8249 Family history of ischemic heart disease and other diseases of the circulatory system: Secondary | ICD-10-CM

## 2022-04-14 DIAGNOSIS — D649 Anemia, unspecified: Secondary | ICD-10-CM | POA: Diagnosis not present

## 2022-04-14 DIAGNOSIS — R54 Age-related physical debility: Secondary | ICD-10-CM | POA: Diagnosis present

## 2022-04-14 DIAGNOSIS — N179 Acute kidney failure, unspecified: Secondary | ICD-10-CM | POA: Diagnosis present

## 2022-04-14 DIAGNOSIS — I4821 Permanent atrial fibrillation: Secondary | ICD-10-CM | POA: Diagnosis not present

## 2022-04-14 DIAGNOSIS — Y92128 Other place in nursing home as the place of occurrence of the external cause: Secondary | ICD-10-CM | POA: Diagnosis not present

## 2022-04-14 DIAGNOSIS — I252 Old myocardial infarction: Secondary | ICD-10-CM

## 2022-04-14 DIAGNOSIS — Z811 Family history of alcohol abuse and dependence: Secondary | ICD-10-CM

## 2022-04-14 DIAGNOSIS — G309 Alzheimer's disease, unspecified: Secondary | ICD-10-CM | POA: Diagnosis present

## 2022-04-14 DIAGNOSIS — E538 Deficiency of other specified B group vitamins: Secondary | ICD-10-CM | POA: Diagnosis present

## 2022-04-14 DIAGNOSIS — S72002A Fracture of unspecified part of neck of left femur, initial encounter for closed fracture: Secondary | ICD-10-CM | POA: Diagnosis present

## 2022-04-14 DIAGNOSIS — I130 Hypertensive heart and chronic kidney disease with heart failure and stage 1 through stage 4 chronic kidney disease, or unspecified chronic kidney disease: Secondary | ICD-10-CM | POA: Diagnosis present

## 2022-04-14 DIAGNOSIS — I10 Essential (primary) hypertension: Secondary | ICD-10-CM

## 2022-04-14 DIAGNOSIS — Z91013 Allergy to seafood: Secondary | ICD-10-CM

## 2022-04-14 DIAGNOSIS — W19XXXA Unspecified fall, initial encounter: Secondary | ICD-10-CM | POA: Diagnosis present

## 2022-04-14 DIAGNOSIS — C8593 Non-Hodgkin lymphoma, unspecified, intra-abdominal lymph nodes: Secondary | ICD-10-CM | POA: Diagnosis present

## 2022-04-14 DIAGNOSIS — C8583 Other specified types of non-Hodgkin lymphoma, intra-abdominal lymph nodes: Secondary | ICD-10-CM | POA: Diagnosis not present

## 2022-04-14 DIAGNOSIS — I5022 Chronic systolic (congestive) heart failure: Secondary | ICD-10-CM | POA: Diagnosis present

## 2022-04-14 DIAGNOSIS — Z807 Family history of other malignant neoplasms of lymphoid, hematopoietic and related tissues: Secondary | ICD-10-CM

## 2022-04-14 DIAGNOSIS — S72142A Displaced intertrochanteric fracture of left femur, initial encounter for closed fracture: Principal | ICD-10-CM | POA: Diagnosis present

## 2022-04-14 DIAGNOSIS — Z803 Family history of malignant neoplasm of breast: Secondary | ICD-10-CM

## 2022-04-14 DIAGNOSIS — N1832 Chronic kidney disease, stage 3b: Secondary | ICD-10-CM | POA: Diagnosis present

## 2022-04-14 DIAGNOSIS — Z66 Do not resuscitate: Secondary | ICD-10-CM | POA: Diagnosis present

## 2022-04-14 DIAGNOSIS — D62 Acute posthemorrhagic anemia: Secondary | ICD-10-CM | POA: Diagnosis not present

## 2022-04-14 DIAGNOSIS — F0280 Dementia in other diseases classified elsewhere without behavioral disturbance: Secondary | ICD-10-CM | POA: Diagnosis present

## 2022-04-14 DIAGNOSIS — R748 Abnormal levels of other serum enzymes: Secondary | ICD-10-CM | POA: Diagnosis present

## 2022-04-14 DIAGNOSIS — Z818 Family history of other mental and behavioral disorders: Secondary | ICD-10-CM | POA: Diagnosis not present

## 2022-04-14 DIAGNOSIS — Z7901 Long term (current) use of anticoagulants: Secondary | ICD-10-CM | POA: Diagnosis not present

## 2022-04-14 DIAGNOSIS — N189 Chronic kidney disease, unspecified: Secondary | ICD-10-CM | POA: Diagnosis not present

## 2022-04-14 DIAGNOSIS — I4891 Unspecified atrial fibrillation: Secondary | ICD-10-CM | POA: Diagnosis present

## 2022-04-14 DIAGNOSIS — F0150 Vascular dementia without behavioral disturbance: Secondary | ICD-10-CM | POA: Diagnosis present

## 2022-04-14 DIAGNOSIS — Z79899 Other long term (current) drug therapy: Secondary | ICD-10-CM | POA: Diagnosis not present

## 2022-04-14 HISTORY — PX: INTRAMEDULLARY (IM) NAIL INTERTROCHANTERIC: SHX5875

## 2022-04-14 LAB — CBC
HCT: 41.4 % (ref 36.0–46.0)
Hemoglobin: 13.1 g/dL (ref 12.0–15.0)
MCH: 30 pg (ref 26.0–34.0)
MCHC: 31.6 g/dL (ref 30.0–36.0)
MCV: 95 fL (ref 80.0–100.0)
Platelets: 275 10*3/uL (ref 150–400)
RBC: 4.36 MIL/uL (ref 3.87–5.11)
RDW: 15.5 % (ref 11.5–15.5)
WBC: 6.3 10*3/uL (ref 4.0–10.5)
nRBC: 0 % (ref 0.0–0.2)

## 2022-04-14 LAB — COMPREHENSIVE METABOLIC PANEL
ALT: 9 U/L (ref 0–44)
AST: 25 U/L (ref 15–41)
Albumin: 3.5 g/dL (ref 3.5–5.0)
Alkaline Phosphatase: 141 U/L — ABNORMAL HIGH (ref 38–126)
Anion gap: 15 (ref 5–15)
BUN: 21 mg/dL (ref 8–23)
CO2: 22 mmol/L (ref 22–32)
Calcium: 9.1 mg/dL (ref 8.9–10.3)
Chloride: 99 mmol/L (ref 98–111)
Creatinine, Ser: 1.52 mg/dL — ABNORMAL HIGH (ref 0.44–1.00)
GFR, Estimated: 32 mL/min — ABNORMAL LOW (ref >60)
Glucose, Bld: 117 mg/dL — ABNORMAL HIGH (ref 70–99)
Potassium: 4.2 mmol/L (ref 3.5–5.1)
Sodium: 136 mmol/L (ref 135–145)
Total Bilirubin: 0.8 mg/dL (ref 0.3–1.2)
Total Protein: 6.7 g/dL (ref 6.5–8.1)

## 2022-04-14 LAB — MRSA NEXT GEN BY PCR, NASAL: MRSA by PCR Next Gen: NOT DETECTED

## 2022-04-14 LAB — PROTIME-INR
INR: 1.1 (ref 0.8–1.2)
Prothrombin Time: 13.9 seconds (ref 11.4–15.2)

## 2022-04-14 LAB — SAMPLE TO BLOOD BANK

## 2022-04-14 LAB — ETHANOL: Alcohol, Ethyl (B): <10 mg/dL (ref <10)

## 2022-04-14 SURGERY — FIXATION, FRACTURE, INTERTROCHANTERIC, WITH INTRAMEDULLARY ROD
Anesthesia: General | Site: Hip | Laterality: Left

## 2022-04-14 MED ORDER — PHENYLEPHRINE HCL-NACL 20-0.9 MG/250ML-% IV SOLN
INTRAVENOUS | Status: DC | PRN
  Administered 2022-04-14: 50 ug/min via INTRAVENOUS

## 2022-04-14 MED ORDER — ASPIRIN 325 MG PO TABS
325.0000 mg | ORAL_TABLET | Freq: Every day | ORAL | Status: DC
  Administered 2022-04-14: 325 mg via ORAL
  Filled 2022-04-14: qty 1

## 2022-04-14 MED ORDER — SODIUM CHLORIDE 0.9 % IV SOLN: INTRAVENOUS | Status: DC

## 2022-04-14 MED ORDER — LIDOCAINE 2% (20 MG/ML) 5 ML SYRINGE
INTRAMUSCULAR | Status: DC | PRN
  Administered 2022-04-14: 100 mg via INTRAVENOUS

## 2022-04-14 MED ORDER — MUPIROCIN 2 % EX OINT
TOPICAL_OINTMENT | CUTANEOUS | Status: AC
  Filled 2022-04-14: qty 22

## 2022-04-14 MED ORDER — CHLORHEXIDINE GLUCONATE 0.12 % MT SOLN: 15.0000 mL | Freq: Once | OROMUCOSAL | Status: AC

## 2022-04-14 MED ORDER — MIDODRINE HCL 5 MG PO TABS
10.0000 mg | ORAL_TABLET | Freq: Three times a day (TID) | ORAL | Status: DC
  Administered 2022-04-14 – 2022-04-18 (×11): 10 mg via ORAL
  Filled 2022-04-14 (×11): qty 2

## 2022-04-14 MED ORDER — ADULT MULTIVITAMIN W/MINERALS CH
1.0000 | ORAL_TABLET | Freq: Every day | ORAL | Status: DC
  Administered 2022-04-15 – 2022-04-18 (×4): 1 via ORAL
  Filled 2022-04-14 (×4): qty 1

## 2022-04-14 MED ORDER — BUSPIRONE HCL 10 MG PO TABS: 5.0000 mg | ORAL_TABLET | Freq: Two times a day (BID) | ORAL | Status: DC

## 2022-04-14 MED ORDER — ENSURE ENLIVE PO LIQD
237.0000 mL | Freq: Two times a day (BID) | ORAL | Status: DC
  Administered 2022-04-15 – 2022-04-18 (×6): 237 mL via ORAL

## 2022-04-14 MED ORDER — CEFAZOLIN SODIUM-DEXTROSE 2-4 GM/100ML-% IV SOLN
2.0000 g | Freq: Three times a day (TID) | INTRAVENOUS | Status: AC
  Administered 2022-04-14 – 2022-04-15 (×2): 2 g via INTRAVENOUS
  Filled 2022-04-14 (×2): qty 100

## 2022-04-14 MED ORDER — FENTANYL CITRATE PF 50 MCG/ML IJ SOSY
25.0000 ug | PREFILLED_SYRINGE | Freq: Once | INTRAMUSCULAR | Status: AC
  Administered 2022-04-14: 25 ug via INTRAVENOUS
  Filled 2022-04-14: qty 1

## 2022-04-14 MED ORDER — DEXAMETHASONE SODIUM PHOSPHATE 10 MG/ML IJ SOLN
INTRAMUSCULAR | Status: DC | PRN
  Administered 2022-04-14: 10 mg via INTRAVENOUS

## 2022-04-14 MED ORDER — PHENYLEPHRINE 80 MCG/ML (10ML) SYRINGE FOR IV PUSH (FOR BLOOD PRESSURE SUPPORT)
PREFILLED_SYRINGE | INTRAVENOUS | Status: DC | PRN
  Administered 2022-04-14 (×3): 200 ug via INTRAVENOUS
  Administered 2022-04-14: 160 ug via INTRAVENOUS
  Administered 2022-04-14: 200 ug via INTRAVENOUS
  Administered 2022-04-14: 160 ug via INTRAVENOUS
  Administered 2022-04-14: 200 ug via INTRAVENOUS

## 2022-04-14 MED ORDER — FENTANYL CITRATE (PF) 100 MCG/2ML IJ SOLN: 25.0000 ug | INTRAMUSCULAR | Status: DC | PRN

## 2022-04-14 MED ORDER — 0.9 % SODIUM CHLORIDE (POUR BTL) OPTIME
TOPICAL | Status: DC | PRN
  Administered 2022-04-14: 1000 mL

## 2022-04-14 MED ORDER — ORAL CARE MOUTH RINSE
15.0000 mL | Freq: Once | OROMUCOSAL | Status: AC
  Administered 2022-04-14: 15 mL via OROMUCOSAL

## 2022-04-14 MED ORDER — CEFAZOLIN SODIUM-DEXTROSE 2-4 GM/100ML-% IV SOLN
2.0000 g | INTRAVENOUS | Status: AC
  Administered 2022-04-14: 2 g via INTRAVENOUS
  Filled 2022-04-14: qty 100

## 2022-04-14 MED ORDER — MEMANTINE HCL 10 MG PO TABS
10.0000 mg | ORAL_TABLET | Freq: Two times a day (BID) | ORAL | Status: DC
  Administered 2022-04-14 – 2022-04-18 (×9): 10 mg via ORAL
  Filled 2022-04-14 (×8): qty 1

## 2022-04-14 MED ORDER — HYDROCODONE-ACETAMINOPHEN 5-325 MG PO TABS
1.0000 | ORAL_TABLET | Freq: Four times a day (QID) | ORAL | Status: DC | PRN
  Administered 2022-04-16: 1 via ORAL
  Filled 2022-04-14: qty 1

## 2022-04-14 MED ORDER — ONDANSETRON HCL 4 MG/2ML IJ SOLN: 4.0000 mg | Freq: Once | INTRAMUSCULAR | Status: DC | PRN

## 2022-04-14 MED ORDER — ONDANSETRON HCL 4 MG/2ML IJ SOLN
INTRAMUSCULAR | Status: DC | PRN
  Administered 2022-04-14: 4 mg via INTRAVENOUS

## 2022-04-14 MED ORDER — ROCURONIUM BROMIDE 10 MG/ML (PF) SYRINGE
PREFILLED_SYRINGE | INTRAVENOUS | Status: AC
  Filled 2022-04-14: qty 10

## 2022-04-14 MED ORDER — ALBUTEROL SULFATE (2.5 MG/3ML) 0.083% IN NEBU: 2.5000 mg | INHALATION_SOLUTION | Freq: Four times a day (QID) | RESPIRATORY_TRACT | Status: DC | PRN

## 2022-04-14 MED ORDER — FENTANYL CITRATE (PF) 250 MCG/5ML IJ SOLN
INTRAMUSCULAR | Status: DC | PRN
  Administered 2022-04-14: 50 ug via INTRAVENOUS

## 2022-04-14 MED ORDER — ESCITALOPRAM OXALATE 10 MG PO TABS
10.0000 mg | ORAL_TABLET | Freq: Every morning | ORAL | Status: DC
  Administered 2022-04-14 – 2022-04-18 (×5): 10 mg via ORAL
  Filled 2022-04-14 (×5): qty 1

## 2022-04-14 MED ORDER — POVIDONE-IODINE 10 % EX SWAB: 2.0000 | Freq: Once | CUTANEOUS | Status: DC

## 2022-04-14 MED ORDER — ROCURONIUM BROMIDE 10 MG/ML (PF) SYRINGE
PREFILLED_SYRINGE | INTRAVENOUS | Status: DC | PRN
  Administered 2022-04-14: 30 mg via INTRAVENOUS

## 2022-04-14 MED ORDER — PROPOFOL 10 MG/ML IV BOLUS
INTRAVENOUS | Status: AC
  Filled 2022-04-14: qty 20

## 2022-04-14 MED ORDER — SENNOSIDES-DOCUSATE SODIUM 8.6-50 MG PO TABS: 1.0000 | ORAL_TABLET | Freq: Every day | ORAL | Status: DC

## 2022-04-14 MED ORDER — FENTANYL CITRATE (PF) 250 MCG/5ML IJ SOLN
INTRAMUSCULAR | Status: AC
  Filled 2022-04-14: qty 5

## 2022-04-14 MED ORDER — DOCUSATE SODIUM 100 MG PO CAPS
100.0000 mg | ORAL_CAPSULE | Freq: Two times a day (BID) | ORAL | Status: DC
  Administered 2022-04-14: 100 mg via ORAL
  Filled 2022-04-14: qty 1

## 2022-04-14 MED ORDER — LACTATED RINGERS IV SOLN: INTRAVENOUS | Status: DC

## 2022-04-14 MED ORDER — METHOCARBAMOL 500 MG PO TABS
500.0000 mg | ORAL_TABLET | Freq: Four times a day (QID) | ORAL | Status: DC | PRN
  Administered 2022-04-16: 500 mg via ORAL
  Filled 2022-04-14: qty 1

## 2022-04-14 MED ORDER — POLYETHYLENE GLYCOL 3350 17 G PO PACK
17.0000 g | PACK | Freq: Every day | ORAL | Status: DC
  Administered 2022-04-15 – 2022-04-18 (×4): 17 g via ORAL
  Filled 2022-04-14 (×4): qty 1

## 2022-04-14 MED ORDER — ROPIVACAINE HCL 5 MG/ML IJ SOLN
INTRAMUSCULAR | Status: DC | PRN
  Administered 2022-04-14: 30 mL via PERINEURAL

## 2022-04-14 MED ORDER — CHLORHEXIDINE GLUCONATE 4 % EX LIQD
60.0000 mL | Freq: Once | CUTANEOUS | Status: AC
  Administered 2022-04-14: 4 via TOPICAL
  Filled 2022-04-14: qty 15

## 2022-04-14 MED ORDER — ONDANSETRON HCL 4 MG/2ML IJ SOLN: 4.0000 mg | Freq: Four times a day (QID) | INTRAMUSCULAR | Status: DC | PRN

## 2022-04-14 MED ORDER — SUGAMMADEX SODIUM 200 MG/2ML IV SOLN
INTRAVENOUS | Status: DC | PRN
  Administered 2022-04-14: 200 mg via INTRAVENOUS

## 2022-04-14 MED ORDER — AMIODARONE HCL 200 MG PO TABS
100.0000 mg | ORAL_TABLET | Freq: Every day | ORAL | Status: DC
  Administered 2022-04-14 – 2022-04-18 (×5): 100 mg via ORAL
  Filled 2022-04-14 (×5): qty 1

## 2022-04-14 MED ORDER — CHLORHEXIDINE GLUCONATE 0.12 % MT SOLN
OROMUCOSAL | Status: AC
  Filled 2022-04-14: qty 15

## 2022-04-14 MED ORDER — LIDOCAINE 2% (20 MG/ML) 5 ML SYRINGE
INTRAMUSCULAR | Status: AC
  Filled 2022-04-14: qty 5

## 2022-04-14 MED ORDER — BUSPIRONE HCL 10 MG PO TABS
10.0000 mg | ORAL_TABLET | Freq: Three times a day (TID) | ORAL | Status: DC
  Administered 2022-04-14 – 2022-04-18 (×13): 10 mg via ORAL
  Filled 2022-04-14 (×12): qty 1

## 2022-04-14 MED ORDER — FUROSEMIDE 40 MG PO TABS: 40.0000 mg | ORAL_TABLET | Freq: Every day | ORAL | Status: DC

## 2022-04-14 MED ORDER — PROPOFOL 10 MG/ML IV BOLUS
INTRAVENOUS | Status: DC | PRN
  Administered 2022-04-14: 120 mg via INTRAVENOUS

## 2022-04-14 MED ORDER — MORPHINE SULFATE (PF) 2 MG/ML IV SOLN: 0.5000 mg | INTRAVENOUS | Status: DC | PRN

## 2022-04-14 MED ORDER — APIXABAN 2.5 MG PO TABS
2.5000 mg | ORAL_TABLET | Freq: Two times a day (BID) | ORAL | Status: DC
  Administered 2022-04-15 – 2022-04-18 (×7): 2.5 mg via ORAL
  Filled 2022-04-14 (×7): qty 1

## 2022-04-14 SURGICAL SUPPLY — 41 items
BAG COUNTER SPONGE SURGICOUNT (BAG) IMPLANT
BIT DRILL 4.3MMS DISTAL GRDTED (BIT) IMPLANT
BIT DRILL LAG SCREW (DRILL) IMPLANT
CHLORAPREP W/TINT 26 (MISCELLANEOUS) ×1 IMPLANT
COVER PERINEAL POST (MISCELLANEOUS) ×1 IMPLANT
COVER SURGICAL LIGHT HANDLE (MISCELLANEOUS) ×1 IMPLANT
DRAPE C-ARMOR (DRAPES) ×1 IMPLANT
DRAPE STERI IOBAN 125X83 (DRAPES) ×1 IMPLANT
DRESSING MEPILEX FLEX 4X4 (GAUZE/BANDAGES/DRESSINGS) ×2 IMPLANT
DRILL 4.3MMS DISTAL GRADUATED (BIT) ×1
DRILL LAG SCREW (DRILL) ×1
DRSG EMULSION OIL 3X3 NADH (GAUZE/BANDAGES/DRESSINGS) IMPLANT
DRSG MEPILEX BORDER 4X8 (GAUZE/BANDAGES/DRESSINGS) IMPLANT
DRSG MEPILEX FLEX 4X4 (GAUZE/BANDAGES/DRESSINGS) ×2
DRSG MEPILEX POST OP 4X8 (GAUZE/BANDAGES/DRESSINGS) IMPLANT
DRSG TEGADERM 4X4.75 (GAUZE/BANDAGES/DRESSINGS) IMPLANT
ELECT REM PT RETURN 15FT ADLT (MISCELLANEOUS) ×1 IMPLANT
GAUZE XEROFORM 5X9 LF (GAUZE/BANDAGES/DRESSINGS) ×1 IMPLANT
GLOVE BIOGEL PI IND STRL 6.5 (GLOVE) ×1 IMPLANT
GLOVE BIOGEL PI IND STRL 8 (GLOVE) ×1 IMPLANT
GLOVE ECLIPSE 6.0 STRL STRAW (GLOVE) ×1 IMPLANT
GLOVE INDICATOR 8.0 STRL GRN (GLOVE) ×1 IMPLANT
GOWN STRL REUS W/ TWL LRG LVL3 (GOWN DISPOSABLE) ×2 IMPLANT
GOWN STRL REUS W/TWL LRG LVL3 (GOWN DISPOSABLE) ×2
GUIDEPIN VERSANAIL DSP 3.2X444 (ORTHOPEDIC DISPOSABLE SUPPLIES) IMPLANT
GUIDEWIRE BALL NOSE 100CM (WIRE) IMPLANT
HIP FRA NAIL LAG SCREW 10.5X90 (Orthopedic Implant) ×1 IMPLANT
KIT BASIN OR (CUSTOM PROCEDURE TRAY) ×1 IMPLANT
KIT TURNOVER KIT A (KITS) IMPLANT
NAIL IM TROCH LNG 11X340 125D (Nail) IMPLANT
NS IRRIG 1000ML POUR BTL (IV SOLUTION) ×1 IMPLANT
PACK GENERAL/GYN (CUSTOM PROCEDURE TRAY) ×1 IMPLANT
SCREW BONE CORTICAL 5.0X42 (Screw) IMPLANT
SCREW BONE CORTICAL 5.0X44 (Screw) IMPLANT
SCREW CANN THRD AFF 10.5X100 (Orthopedic Implant) IMPLANT
SCREW LAG HIP FRA NAIL 10.5X90 (Orthopedic Implant) IMPLANT
STAPLER VISISTAT 35W (STAPLE) ×1 IMPLANT
SUT VIC AB 0 CT1 36 (SUTURE) ×1 IMPLANT
SUT VIC AB 1 CT1 36 (SUTURE) ×1 IMPLANT
SUT VIC AB 2-0 CT1 27 (SUTURE) ×1
SUT VIC AB 2-0 CT1 TAPERPNT 27 (SUTURE) ×1 IMPLANT

## 2022-04-14 NOTE — ED Notes (Signed)
Trauma Response Nurse Documentation   Kathy Massey is a 89 y.o. female arriving to [facility] ED via [facility]  On Eliquis (apixaban) daily. Trauma was activated as a Level 2 by Kathy Massey based on the following trauma criteria Elderly patients > 65 with head trauma on anti-coagulation (excluding ASA). Trauma team at the bedside on patient arrival.   Patient cleared for CT by Dr. Christy Massey. Pt transported to CT with trauma response nurse present to monitor. RN remained with the patient throughout their absence from the department for clinical observation.   GCS 15.  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 EF > 55% by 06/16/15 echo.   ELBOW BURSA SURGERY Right 2016   EYE SURGERY     cataracts removed ,both eyes, /w IOL   INTRAMEDULLARY (IM) NAIL INTERTROCHANTERIC Right 02/14/2021   Procedure: INTRAMEDULLARY (IM) NAIL INTERTROCHANTRIC;  Surgeon: Kathy Mulders, MD;  Location: [place];  Service: Orthopedics;  Laterality: Right;   LYMPH NODE BIOPSY Left 01/14/2018   Procedure: EXCISION DEEP LEFT INGUINAL LYMPH NODE BIOPSY ERAS PATHWAY;  Surgeon: Kathy Skates, MD;  Location: [place];  Service: General;  Laterality: Left;   TONSILLECTOMY         Initial Focused Assessment (If applicable, or please see trauma documentation): Airway-- intact, no visible obstruction Breathing-- spontaneous, unlabored Circulation-- no obvious bleeding noted  CT's Completed:   CT Head and CT C-Spine   Interventions:  See event summary  Plan for  disposition:  Admission to floor   Consults completed:  Orthopaedic Surgeon at [phone].  Event Summary: Patient brought in by [facility]. Patient had an unwitnessed fall. Denies loss of consciousness. Patient arrives with right leg shortening and rotation. Patient received 50 mcg fentanyl via EMS in 2 25 mcg doses. O2 sats dropped slightly after each. Labs obtained. Xray chest, pelvis completed. CT head, c-spine completed.  MTP Summary (If applicable):   Bedside handoff with ED RN Kathy Massey.    Kathy Massey  Trauma Response RN  Please call TRN at [phone] for further assistance.

## 2022-04-14 NOTE — Anesthesia Preprocedure Evaluation (Addendum)
Anesthesia Evaluation  Patient identified by MRN, date of birth, ID band Patient confused    Reviewed: Allergy & Precautions, NPO status , Patient's Chart, lab work & pertinent test results  History of Anesthesia Complications Negative for: history of anesthetic complications  Airway Mallampati: III  TM Distance: <3 FB Neck ROM: Full    Dental  (+) Dental Advisory Given, Teeth Intact   Pulmonary neg pulmonary ROS   breath sounds clear to auscultation       Cardiovascular hypertension, Pt. on medications + Past MI  + dysrhythmias Atrial Fibrillation + Valvular Problems/Murmurs  Rhythm:Irregular Rate:Normal     Neuro/Psych  PSYCHIATRIC DISORDERS     Dementia negative neurological ROS     GI/Hepatic negative GI ROS, Neg liver ROS,,,  Endo/Other    Renal/GU Renal diseaseLab Results      Component                Value               Date                      WBC                      6.4                 02/14/2021                HGB                      11.7 (L)            02/14/2021                HCT                      37.1                02/14/2021                MCV                      98.9                02/14/2021                PLT                      141 (L)             02/14/2021                Musculoskeletal  (+) Arthritis ,  [place] Hip Right   Abdominal  (+) + scaphoid   Peds  Hematology  (+) Blood dyscrasia, anemia Lab Results      Component                Value               Date                      WBC                      6.4                 02/14/2021  HGB                      11.7 (L)            02/14/2021                HCT                      37.1                02/14/2021                MCV                      98.9                02/14/2021                PLT                      141 (L)             02/14/2021            eliquis last dose 12/31   Anesthesia Other  Findings IONS     1. Left ventricular ejection fraction, by estimation, is 30 to 35%. The  left ventricle has moderately decreased function. The left ventricle  demonstrates global hypokinesis. Diastolic function indeterminant due to  AFib.   2. Right ventricular systolic function is normal. The right ventricular  size is mildly enlarged. There is mildly elevated pulmonary artery  systolic pressure. The estimated right ventricular systolic pressure is  99991111 mmHg.   3. Left atrial size was severely dilated.   4. Right atrial size was severely dilated.   5. The mitral valve is grossly normal. Moderate mitral valve  regurgitation.   6. Tricuspid valve regurgitation is severe.   7. The aortic valve is tricuspid. There is mild calcification of the  aortic valve. There is mild thickening of the aortic valve. Aortic valve  regurgitation is mild. Aortic valve sclerosis/calcification is present,  without any evidence of aortic  stenosis.   8. The inferior vena cava is dilated in size with <50% respiratory  variability, suggesting right atrial pressure of 15 mmHg.   Comparison(s): No prior Echocardiogram.   FINDINGS   Left Ventricle: Left ventricular ejection fraction, by estimation, is 30  to 35%. The left ventricle has moderately decreased function. The left  vent    Reproductive/Obstetrics                             Anesthesia Physical Anesthesia Plan  ASA: 3  Anesthesia Plan: General   Post-op Pain Management:    Induction: Intravenous  PONV Risk Score and Plan: 3 and Ondansetron and Dexamethasone  Airway Management Planned: Oral ETT  Additional Equipment: None  Intra-op Plan:   Post-operative Plan: Extubation in OR  Informed Consent: I have reviewed the patients History and Physical, chart, labs and discussed the procedure including the risks, benefits and alternatives for the proposed anesthesia with the patient or authorized representative  who has indicated his/her understanding and acceptance.   Patient has DNR.  Discussed DNR with power of attorney and Suspend DNR.   Dental advisory given  Plan Discussed with: CRNA  Anesthesia Plan Comments:         Anesthesia Quick Evaluation

## 2022-04-14 NOTE — ED Notes (Signed)
Pt had two depends on, this RN and NT removed soiled depend and placed purewick at this time.

## 2022-04-14 NOTE — H&P (Signed)
History and Physical    Patient: Kathy Massey [id number] DOB: 1927-08-25 DOA: 04/14/2022 DOS: the patient was seen and examined on 04/14/2022 PCP: [name], MD  Patient coming from: [facility] via EMS  Chief Complaint:  Chief Complaint  Patient presents with   level 2 - fall on thinners   HPI: Kathy Massey is a 89 y.o. female with medical history significant of hypertension, atrial fibrillation on chronic anticoagulation, marginal zone lymphoma, CKD stage IIIb, and dementia who presents after having unwitnessed fall.  Patient has significant dementia for which history is limited.  Staff at the facility where she is standing found her on the floor overnight.  Normally patient is supposed to use a walker when getting up and ambulating, but due to dementia likely forgot in the night.  On patient noted to complain of soreness on the left hip.  Denies any other complaints at this time.  Family notes that the patient has had several falls over the last year, but thankfully only suffered a right hip fracture with one of the falls in 02/2021 performed by by Dr. Sammuel Hines of orthopedics.  In the emergency department patient was noted to be mildly tachycardic and tachypneic with all other vital signs maintained.  Labs significant for creatinine 1.52, BUN 21, and alkaline phosphatase 141.  X-rays of the hip revealed acute and angulated intertrochanteric left femur fracture.  CT imaging of the head and cervical spine did not note any acute intracranial or cervical injury, but did note advanced atrophy and chronic small vessel disease.  Patient had been given fentanyl IV for pain.  Orthopedics have been consulted with plans for patient to possibly go to surgery this afternoon.    Review of Systems: As mentioned in the history of present illness. All other systems reviewed and are negative, but noted to be limited due to patient's history of dementia Past Medical History:  Diagnosis Date    Arthritis    hands -oa   Dysrhythmia    afib   Heart murmur    Hypertension    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 ([place]) 12/11/2013   Myocardial infarction ([place])    06/15/15: stress induced cardiomyopathy   Past Surgical History:  Procedure Laterality Date   CARDIAC CATHETERIZATION     06/17/15 [facility]): No CAD, LVEDP 9 mmHg, borderline LV contraction with apical akinesis, suggestive of stress-induced CM. EF > 55% by 06/16/15 echo.   ELBOW BURSA SURGERY Right 2016   EYE SURGERY     cataracts removed ,both eyes, /w IOL   INTRAMEDULLARY (IM) NAIL INTERTROCHANTERIC Right 02/14/2021   Procedure: INTRAMEDULLARY (IM) NAIL INTERTROCHANTRIC;  Surgeon: Vanetta Mulders, MD;  Location: [place];  Service: Orthopedics;  Laterality: Right;   LYMPH NODE BIOPSY Left 01/14/2018   Procedure: EXCISION DEEP LEFT INGUINAL LYMPH NODE BIOPSY ERAS PATHWAY;  Surgeon: Fanny Skates, MD;  Location: [place];  Service: General;  Laterality: Left;   TONSILLECTOMY     Social History:  reports that she has never smoked. She has never used smokeless tobacco. She reports current alcohol use of about 1.0 standard drink of alcohol per week. She reports that she does not use drugs.  Allergies  Allergen Reactions   Shellfish Allergy Itching and Swelling    Shrimp     Family History  Problem Relation Age of Onset   Heart disease Mother    Depression Mother    Heart failure Mother    Heart disease Father  Alcohol abuse Father    Heart attack Father    Depression Father    Multiple myeloma Maternal Grandmother    Alcohol abuse Maternal Grandfather    Breast cancer Paternal Grandmother    Breast cancer Daughter     Prior to Admission medications   Medication Sig Start Date End Date Taking? Authorizing Provider  amiodarone (PACERONE) 200 MG tablet Take 0.5 tablets (100 mg total) by mouth daily. 04/15/21   Shirley Friar, PA-C  apixaban (ELIQUIS) 2.5 MG TABS tablet TAKE 1 TABLET BY MOUTH TWICE A  DAY Patient taking differently: Take 2.5 mg by mouth 2 (two) times daily. 10/26/20   Camnitz, Ocie Doyne, MD  atorvastatin (LIPITOR) 20 MG tablet Take 20 mg by mouth at bedtime. Patient not taking: Reported on 04/15/2021    [provider]  busPIRone (BUSPAR) 5 MG tablet Take 1 tablet (5 mg total) by mouth 2 (two) times daily. Patient not taking: Reported on 04/15/2021 02/24/21   Aline August, MD  cyanocobalamin 1000 MCG tablet Take 1,000 mcg by mouth every morning.    [provider]  docusate sodium (COLACE) 100 MG capsule Take 1 capsule (100 mg total) by mouth 2 (two) times daily. 02/24/21   Aline August, MD  escitalopram (LEXAPRO) 5 MG tablet Take 5 mg by mouth every morning. 09/06/20   [provider]  fluocinonide ointment (LIDEX) [id number] % Apply 1 application topically See admin instructions. Order date 02/02/21 - apply topically to scalp twice daily Monday thru Friday until healed Patient not taking: Reported on 04/15/2021    [provider]  furosemide (LASIX) 40 MG tablet Take 1 tablet (40 mg total) by mouth daily. 02/24/21   Aline August, MD  imiquimod [place]) 5 % cream Apply topically. 05/26/21   [provider]  memantine (NAMENDA) 10 MG tablet Take 10 mg by mouth 2 (two) times daily. For memory Patient not taking: Reported on 04/15/2021 11/11/20   [provider]  methocarbamol (ROBAXIN) 500 MG tablet Take 1 tablet (500 mg total) by mouth every 6 (six) hours as needed for muscle spasms. 02/24/21   Aline August, MD  midodrine (PROAMATINE) 10 MG tablet Take 1 tablet (10 mg total) by mouth 3 (three) times daily with meals. 02/24/21   Aline August, MD  polyethylene glycol (MIRALAX / GLYCOLAX) 17 g packet Take 17 g by mouth daily. 02/24/21   Aline August, MD  QUEtiapine (SEROQUEL) 25 MG tablet Take 1 tablet (25 mg total) by mouth at bedtime. 02/24/21   Aline August, MD    Physical Exam: Vitals:   04/14/22 [id number] 04/14/22 0615 04/14/22 0630  04/14/22 0645  BP:  119/81 109/85 120/81  Pulse: 89 94 97 97  Resp: (!) 21 (!) '26 17 19  '$ Temp:      TempSrc:      SpO2: 100% 100% 100% 100%  Weight:      Height:      Constitutional: Elderly female who appears to be in no acute distress. Eyes: PERRL, lids and conjunctivae normal ENMT: Mucous membranes are moist.  Normal dentition. Neck: normal, supple  Respiratory: clear to auscultation bilaterally, Normal respiratory effort.  Currently on 2 L of nasal cannula oxygen with O2 saturations maintained. Cardiovascular: Irregular irregular, no murmurs / rubs / gallops. No extremity edema. 2+ pedal pulses.  Abdomen: no tenderness, no masses palpated. No hepatosplenomegaly. Bowel sounds positive.  Musculoskeletal: no clubbing / cyanosis.  Tender to palpation of the left hip with leg slightly shortened. Skin:  Poor skin turgor.  No open wounds appreciated at this time. Neurologic: CN 2-12 grossly intact.  Strength 5/5 in all 4.  Psychiatric: Alert and oriented to person only.  Normal mood. Data Reviewed:  EKG revealed atrial fibrillation at 90 bpm.  Reviewed labs, imaging, and pertinent records as noted above in HPI.  Assessment and Plan:  Intertrochanteric left femur fracture secondary to fall Acute.  Patient presents after having unwitnessed fall.  X-rays revealed acute and angulated left intertrochanteric femur fracture.  Orthopedics consulted and plan on taking for surgery this afternoon. -Admit to a medical telemetry bed -Hip fracture order set utilized -N.p.o. for need of procedure -Hydrocodone/morphine IV as needed for moderate to severe pain respectively -Anesthesia consult for nerve block -Appreciate orthopedic consultative services, we will follow-up for any further recommendations  Persistent atrial fibrillation on chronic anticoagulation Patient noted to be in atrial fibrillation with rates controlled.  Last dose of Eliquis yesterday evening. -Hold Eliquis due to need of  surgical procedure and resume once able. -Continue amiodarone -Goal potassium at least 4 and magnesium at least 2  Acute on chronic renal insufficiency Creatinine elevated up to 1.53 with BUN 24.  Patient noted to have poor skin turgor. -Hold furosemide for today and resume in a.m. -Normal saline IV fluids at 75 mL/h x 7 hours -Continue to monitor kidney function  Mixed Alzheimer's   and vascular dementia Patient noted to have a history of behavior issues previously in the past. -Delirium precautions -Continue Namenda, Lexapro, and BuSpar  Marginal zone lymphoma of the intra-abdominal lymph nodes Previously treated with systemic chemotherapy with Rituxan/Treanda followed by maintenance Rituxan completed in February 2018.  Was treated with acalabrutinib for recurrent disease, but stopped due to toxicity  -Continue outpatient follow-up with Dr. Marin Olp of oncology  Elevated alkaline phosphatase Chronic.  Alkaline phosphatase 141 which appears slightly lower than priors from last year. -Continue to monitor  DNR Confirmed by family present at bedside.  DVT prophylaxis: SCDs if able Advance Care Planning:   Code Status: DNR  Consults: Orthopedics  Family Communication: Patient's son and daughter updated at bedside Severity of Illness: The appropriate patient status for this patient is INPATIENT. Inpatient status is judged to be reasonable and necessary in order to provide the required intensity of service to ensure the patient's safety. The patient's presenting symptoms, physical exam findings, and initial radiographic and laboratory data in the context of their chronic comorbidities is felt to place them at high risk for further clinical deterioration. Furthermore, it is not anticipated that the patient will be medically stable for discharge from the hospital within 2 midnights of admission.   * I certify that at the point of admission it is my clinical judgment that the patient will  require inpatient hospital care spanning beyond 2 midnights from the point of admission due to high intensity of service, high risk for further deterioration and high frequency of surveillance required.*  Author: Norval Morton, MD 04/14/2022 7:15 AM  For on call review [email or web address].

## 2022-04-14 NOTE — ED Notes (Signed)
EDP removed c-collar.

## 2022-04-14 NOTE — Brief Op Note (Signed)
   Brief Op Note  Date of Surgery: 04/14/2022  Preoperative Diagnosis: Left Hip Fracture  Postoperative Diagnosis: same  Procedure: Procedure(s): INTRAMEDULLARY (IM) NAIL INTERTROCHANTERIC  Implants: Implant Name Type Inv. Item Serial No. Manufacturer Lot No. LRB No. Used Action  SCREW BONE CORTICAL 5.0X42 - [id number] Screw SCREW BONE CORTICAL 5.0X42  [employer](ORTH,TRAU,BIO,SG) [id number] Left 1 Implanted  SCREW BONE CORTICAL 5.0X44 - [id number] Screw SCREW BONE CORTICAL 5.0X44  [employer](ORTH,TRAU,BIO,SG) [id number] Left 1 Implanted  HIP FRA NAIL LAG SCREW 10.5X90 - [id number] Orthopedic Implant HIP FRA NAIL LAG SCREW 10.5X90  [employer]IMMER [employer]ON(ORTH,TRAU,BIO,SG) [id number] B Left 1 Implanted    Surgeons: Surgeon(s): Vanetta Mulders, MD  Anesthesia: General    Estimated Blood Loss: See anesthesia record  Complications: None  Condition to PACU: Stable  Yevonne Pax, MD 04/14/2022 4:45 PM

## 2022-04-14 NOTE — Interval H&P Note (Signed)
History and Physical Interval Note:  04/14/2022 3:22 PM  Kathy Massey  has presented today for surgery, with the diagnosis of Left Hip.  The various methods of treatment have been discussed with the patient and family. After consideration of risks, benefits and other options for treatment, the patient has consented to  Procedure(s): INTRAMEDULLARY (IM) NAIL INTERTROCHANTERIC (Left) as a surgical intervention.  The patient's history has been reviewed, patient examined, no change in status, stable for surgery.  I have reviewed the patient's chart and labs.  Questions were answered to the patient's satisfaction.     Vanetta Mulders

## 2022-04-14 NOTE — ED Notes (Signed)
Pt placed on 2lpm  before pain medication given.

## 2022-04-14 NOTE — Transfer of Care (Signed)
Immediate Anesthesia Transfer of Care Note  Patient: Kathy Massey  Procedure(s) Performed: INTRAMEDULLARY (IM) NAIL INTERTROCHANTERIC (Left: Hip)  Patient Location: PACU  Anesthesia Type:General  Level of Consciousness: drowsy and patient cooperative  Airway & Oxygen Therapy: Patient Spontanous Breathing  Post-op Assessment: Report given to RN and Post -op Vital signs reviewed and stable  Post vital signs: Reviewed and stable  Last Vitals:  Vitals Value Taken Time  BP 148/110 04/14/22 1655  Temp    Pulse 92 04/14/22 1657  Resp 27 04/14/22 1657  SpO2 92 % 04/14/22 1657  Vitals shown include unvalidated device data.  Last Pain:  Vitals:   04/14/22 1348  TempSrc:   PainSc: Asleep         Complications: No notable events documented.

## 2022-04-14 NOTE — Progress Notes (Signed)
Initial Nutrition Assessment  DOCUMENTATION CODES:   Not applicable  INTERVENTION:   Ensure Enlive po BID, each supplement provides 350 kcal and 20 grams of protein.  MVI po daily   Pt at high refeed risk; recommend monitor potassium, magnesium and phosphorus labs daily until stable  NUTRITION DIAGNOSIS:   Increased nutrient needs related to hip fracture as evidenced by estimated needs.  GOAL:   Patient will meet greater than or equal to 90% of their needs  MONITOR:   PO intake, Supplement acceptance, Labs, Weight trends, I & O's, Skin  REASON FOR ASSESSMENT:   Consult Hip fracture protocol  ASSESSMENT:   89 y/o female with h/o gout, HTN, CKD III, Afib, IDA, dementia, CHF and falls who is admitted with left hip fx after fall.  Unable to see patient today as pt remains in the ED with plans to go to surgery today. Pt currently NPO. Pt with increased estimated needs r/t hip fracture. RD will add supplements and MVI to help pt meet her estimated needs. Pt is likely at refeed risk. RD will obtain nutrition related history and exam at follow up. Pt is at high risk for malnutrition.   Per chart, pt is down 9lbs(8%) over the past year.   Medications reviewed and include: senokot  Labs reviewed: K 4.2 wnl, creat 1.52(H)  NUTRITION - FOCUSED PHYSICAL EXAM: Unable to perform a this time   Diet Order:   Diet Order             Diet NPO time specified Except for: Sips with Meds  Diet effective now                  EDUCATION NEEDS:   No education needs have been identified at this time  Skin:  Skin Assessment: Reviewed RN Assessment  Last BM:  pta  Height:   Ht Readings from Last 1 Encounters:  04/14/22 '5\' 2"'$  (1.575 m)    Weight:   Wt Readings from Last 1 Encounters:  04/14/22 49.9 kg    Ideal Body Weight:  50 kg  BMI:  Body mass index is 20.12 kg/m.  Estimated Nutritional Needs:   Kcal:  1300-1500kcal/day  Protein:  65-75g/day  Fluid:   1.3-1.5L/day  Koleen Distance MS, RD, LDN Please refer to [facility] for RD and/or RD on-call/weekend/after hours pager

## 2022-04-14 NOTE — Anesthesia Preprocedure Evaluation (Addendum)
Anesthesia Evaluation  Patient identified by MRN, date of birth, ID band Patient confused    Reviewed: Allergy & Precautions, Patient's Chart, lab work & pertinent test results  Airway Mallampati: I  TM Distance: >3 FB Neck ROM: Full    Dental  (+) Edentulous Upper, Edentulous Lower   Pulmonary     + decreased breath sounds      Cardiovascular hypertension, + Past MI and +CHF  + dysrhythmias + Valvular Problems/Murmurs  Rhythm:Irregular Rate:Tachycardia     Neuro/Psych  PSYCHIATRIC DISORDERS     Dementia    GI/Hepatic negative GI ROS, Neg liver ROS,,,  Endo/Other  negative endocrine ROS    Renal/GU Renal disease     Musculoskeletal  (+) Arthritis ,    Abdominal   Peds  Hematology negative hematology ROS (+)   Anesthesia Other Findings   Reproductive/Obstetrics                             Anesthesia Physical Anesthesia Plan  ASA: 3  Anesthesia Plan: Regional   Post-op Pain Management: Minimal or no pain anticipated   Induction:   PONV Risk Score and Plan: 0  Airway Management Planned: Natural Airway and Nasal Cannula  Additional Equipment: None  Intra-op Plan:   Post-operative Plan:   Informed Consent: I have reviewed the patients History and Physical, chart, labs and discussed the procedure including the risks, benefits and alternatives for the proposed anesthesia with the patient or authorized representative who has indicated his/her understanding and acceptance.     Consent reviewed with POA  Plan Discussed with:   Anesthesia Plan Comments:        Anesthesia Quick Evaluation

## 2022-04-14 NOTE — Anesthesia Postprocedure Evaluation (Signed)
Anesthesia Post Note  Patient: Kathy Massey  Procedure(s) Performed: AN AD HOC NERVE BLOCK     Patient location during evaluation: PACU Anesthesia Type: Regional Level of consciousness: awake Pain management: pain level controlled Vital Signs Assessment: post-procedure vital signs reviewed and stable Cardiovascular status: stable Anesthetic complications: no Comments: Pain 10/10 prior to block  Pain 1/10 after block  No notable events documented.  Last Vitals:  Vitals:   04/14/22 1000 04/14/22 1013  BP: 102/69   Pulse: 99   Resp: (!) 26   Temp:  36.8 C  SpO2: 95%     Last Pain:  Vitals:   04/14/22 1013  TempSrc: Axillary                 Effie Berkshire

## 2022-04-14 NOTE — Op Note (Signed)
Date of Surgery: 04/14/2022  INDICATIONS: [name] is a 89 y.o.-year-old female with displaced left intertrochanteric hip fracute.  The risk and benefits of the procedure were discussed in detail and documented in the pre-operative evaluation.   PREOPERATIVE DIAGNOSIS: 1. Left displaced intertrochanteric fracture  POSTOPERATIVE DIAGNOSIS: Same.  PROCEDURE: 1. Left hip cephulomedullary nail  SURGEON: Yevonne Pax MD  ASSISTANT: Raynelle Fanning, ATC  ANESTHESIA:  general  IV FLUIDS AND URINE: See anesthesia record.  ANTIBIOTICS: Ancef  ESTIMATED BLOOD LOSS: 50 mL.  IMPLANTS:  Implant Name Type Inv. Item Serial No. Manufacturer Lot No. LRB No. Used Action  SCREW BONE CORTICAL 5.0X42 - [id number] Screw SCREW BONE CORTICAL 5.0X42  [employer](ORTH,TRAU,BIO,SG) [id number] Left 1 Implanted  SCREW BONE CORTICAL 5.0X44 - [id number] Screw SCREW BONE CORTICAL 5.0X44  [employer](ORTH,TRAU,BIO,SG) [id number] Left 1 Implanted  HIP FRA NAIL LAG SCREW 10.5X90 - [id number] Orthopedic Implant HIP FRA NAIL LAG SCREW 10.5X90  [employer](ORTH,TRAU,BIO,SG) [id number] B Left 1 Implanted    DRAINS: None  CULTURES: None  COMPLICATIONS: none  DESCRIPTION OF PROCEDURE:  The patient's chart/medical history was reviewed and decision was made to administer peri-operative trans-exemic acid.  The patient was identified in the preoperative holding area and the correct site was marked according universal protocol with nursing, and was subsequently taken back to the operating room.  Anesthesia was induced.  Antibiotics were given 1 hour prior to skin incision.  The patient was placed on the Hana table.  The traction post was placed.  Gross traction and internal rotation was able to provide the best reduction.  The patient was subsequently prepped and draped in the usual sterile fashion.   Final timeout was performed.  A posterolateral approach to the greater trochanter was used.  This was done 3 cm  proximal to the greater trochanter.  10 blade was used to incise through skin and IT band.  Blunt dissection was performed down to the level of the greater trochanter.  The pin was placed under direct fluoroscopic visualization at the center of the greater trochanter at the tip.  This was malleted into place down to the level of the lesser trochanter.  Opening reamer was then used.  A ball-tipped guidewire was then place to distal metaphyseal bone in the femur.  This was passed across the fracture site.  The ball wire was then measured and the appropriate size nail size long nail in 18m was taken.  Size 12.5 reamer was used over the guidewire.  Nail was introduced.  The cephalomedullary wire was placed.  Again measurement was taken after confirming center center on the AP lateral fluoroscopy.  The appropriate size screw was then placed into the neck component of the femur.  The screw was tightened and then backed off one quarter term to allow for compression.  Distal interlocks were then placed.  This was done during perfect circle technique.  15 blade was used to incise through skin and IT band.  Drill was then used bicortically.  Screw sizes were measured and then 2 screws were placed both in static fashion.  The jig was removed.  The wounds were thoroughly irrigated.  Final fluoroscopy was confirmed good reduction on AP and laterals.  Wounds were closed in layers of 0 Vicryl 2-0 Vicryl and staples.  An Aquacel dressing was placed. All counts were correct at the end of the case. The patient was awoken and taken to the PACU without complication.      POSTOPERATIVE PLAN: She will  be weight bearing as tolerated. I will order PT for mobilization. She may resume anticoagulation postop day 1.  Yevonne Pax, MD 4:45 PM

## 2022-04-14 NOTE — Consult Note (Signed)
Reason for Consult:Left hip fx Referring Physician: Fuller Plan Time called: [id number] Time at bedside: 0908   Kathy Massey is an 89 y.o. female.  HPI: [place] fell at [place] where she resides. She had immediate pain and couldn't get up. She was brought to the ED where x-rays showed a left hip fx and orthopedic surgery was consulted. She usually walks with a RW or rollator but sometimes forgets as in this case.  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 EF > 55% by 06/16/15 echo.   ELBOW BURSA SURGERY Right 2016   EYE SURGERY     cataracts removed ,both eyes, /w IOL   INTRAMEDULLARY (IM) NAIL INTERTROCHANTERIC Right 02/14/2021   Procedure: INTRAMEDULLARY (IM) NAIL INTERTROCHANTRIC;  Surgeon: Vanetta Mulders, MD;  Location: [facility];  Service: Orthopedics;  Laterality: Right;   LYMPH NODE BIOPSY Left 01/14/2018   Procedure: EXCISION DEEP LEFT INGUINAL LYMPH NODE BIOPSY ERAS PATHWAY;  Surgeon: Fanny Skates, MD;  Location: [place] [place];  Service: General;  Laterality: Left;   TONSILLECTOMY      Family History  Problem Relation Age of Onset   Heart disease Mother    Depression Mother    Heart failure Mother    Heart disease Father    Alcohol abuse Father    Heart attack Father    Depression Father    Multiple myeloma Maternal Grandmother    Alcohol abuse Maternal Grandfather    Breast cancer Paternal Grandmother    Breast cancer Daughter     Social History:  reports that she has never smoked. She has never used smokeless tobacco. She reports current alcohol use of  about 1.0 standard drink of alcohol per week. She reports that she does not use drugs.  Allergies:  Allergies  Allergen Reactions   Shellfish Allergy Itching and Swelling    Shrimp     Medications: I have reviewed the patient's current medications.  Results for orders placed or performed during the hospital encounter of 04/14/22 (from the past 48 hour(s))  Comprehensive metabolic panel     Status: Abnormal   Collection Time: 04/14/22  4:48 AM  Result Value Ref Range   Sodium 136 135 - 145 mmol/L   Potassium 4.2 3.5 - 5.1 mmol/L   Chloride 99 98 - 111 mmol/L   CO2 22 22 - 32 mmol/L   Glucose, Bld 117 (H) 70 - 99 mg/dL    Comment: Glucose reference range applies only to samples taken after fasting for at least 8 hours.   BUN 21 8 - 23 mg/dL   Creatinine, Ser 1.52 (H) 0.44 - 1.00 mg/dL   Calcium 9.1 8.9 - 10.3 mg/dL   Total Protein 6.7 6.5 - 8.1 g/dL   Albumin 3.5 3.5 - 5.0 g/dL   AST 25 15 - 41 U/L   ALT 9 0 - 44 U/L   Alkaline Phosphatase 141 (H) 38 - 126 U/L   Total Bilirubin 0.8 0.3 - 1.2 mg/dL   GFR, Estimated 32 (L) >60 mL/min  Comment: (NOTE) Calculated using the CKD-EPI Creatinine Equation (2021)    Anion gap 15 5 - 15    Comment: Performed at [facility], [place] [address], [place], [place] [address]  CBC     Status: None   Collection Time: 04/14/22  4:48 AM  Result Value Ref Range   WBC 6.3 4.0 - 10.5 K/uL   RBC 4.36 3.87 - 5.11 MIL/uL   Hemoglobin 13.1 12.0 - 15.0 g/dL   HCT 41.4 36.0 - 46.0 %   MCV 95.0 80.0 - 100.0 fL   MCH 30.0 26.0 - 34.0 pg   MCHC 31.6 30.0 - 36.0 g/dL   RDW 15.5 11.5 - 15.5 %   Platelets 275 150 - 400 K/uL   nRBC 0.0 0.0 - 0.2 %    Comment: Performed at [facility], [place] [address], [place], [place] [address]  Ethanol     Status: None   Collection Time: 04/14/22  4:48 AM  Result Value Ref Range   Alcohol, Ethyl (B) <10 <10 mg/dL    Comment: (NOTE) Lowest detectable limit for serum alcohol is 10 mg/dL.  For medical  purposes only. Performed at [facility], [place] [address], [place], [place] [address]   Sample to Blood Bank     Status: None   Collection Time: 04/14/22  4:50 AM  Result Value Ref Range   Blood Bank Specimen SAMPLE AVAILABLE FOR TESTING    Sample Expiration      04/17/2022,2359 Performed at [facility], [place] [address], [place], [place] [address]   Protime-INR     Status: None   Collection Time: 04/14/22  4:57 AM  Result Value Ref Range   Prothrombin Time 13.9 11.4 - 15.2 seconds   INR 1.1 0.8 - 1.2    Comment: (NOTE) INR goal varies based on device and disease states. Performed at [facility], [place] [address] [place],  [address]     CT HEAD WO CONTRAST  Result Date: 04/14/2022 CLINICAL DATA:  Hip fracture EXAM: CT HEAD WITHOUT CONTRAST CT CERVICAL SPINE WITHOUT CONTRAST TECHNIQUE: Multidetector CT imaging of the head and cervical spine was performed following the standard protocol without intravenous contrast. Multiplanar CT image reconstructions of the cervical spine were also generated. RADIATION DOSE REDUCTION: This exam was performed according to the departmental dose-optimization program which includes automated exposure control, adjustment of the mA and/or kV according to patient size and/or use of iterative reconstruction technique. COMPARISON:  01/17/2022 head CT FINDINGS: CT HEAD FINDINGS Brain: No evidence of acute infarction, hemorrhage, hydrocephalus, extra-axial collection or mass lesion/mass effect. Pronounced brain atrophy. Confluent chronic small vessel ischemia in the cerebral white matter. Small chronic left cerebellar infarction. Vascular: No hyperdense vessel or unexpected calcification. Skull: Normal. Negative for fracture or focal lesion. Bilateral TMJ degenerative arthropathy. Sinuses/Orbits: No evidence of injury CT CERVICAL SPINE FINDINGS Alignment: No traumatic malalignment. Mild degenerative anterolisthesis C2-3. Skull base and vertebrae:  No acute fracture. No primary bone lesion or focal pathologic process. Generalized osteopenia Soft tissues and spinal canal: No prevertebral fluid or swelling. No visible canal hematoma. Disc levels: Ordinary and generalized cervical spine degeneration. Notable foraminal narrowing at C2-3 and C3-4. Upper chest: Clear apical lungs IMPRESSION: 1. No evidence of acute intracranial or cervical spine injury. 2. Advanced brain atrophy and chronic small vessel disease. Electronically Signed   By: Jorje Guild M.D.   On: 04/14/2022 05:09   CT CERVICAL SPINE WO CONTRAST  Result Date: 04/14/2022 CLINICAL DATA:  Hip fracture EXAM: CT HEAD WITHOUT CONTRAST CT CERVICAL SPINE WITHOUT CONTRAST TECHNIQUE: Multidetector CT imaging of the head and cervical spine was performed following the standard protocol without intravenous contrast. Multiplanar CT image reconstructions of the cervical spine were also generated. RADIATION DOSE REDUCTION: This exam was performed according to the departmental dose-optimization program which includes automated exposure control, adjustment of the mA and/or kV according to patient size and/or use of iterative reconstruction technique. COMPARISON:  01/17/2022 head CT FINDINGS: CT HEAD FINDINGS Brain: No evidence of acute infarction, hemorrhage, hydrocephalus, extra-axial collection or mass lesion/mass effect. Pronounced brain atrophy. Confluent chronic small vessel ischemia in the cerebral white matter. Small chronic left cerebellar infarction. Vascular: No hyperdense vessel or unexpected calcification. Skull: Normal. Negative for fracture or focal lesion. Bilateral TMJ degenerative arthropathy. Sinuses/Orbits: No evidence of injury CT CERVICAL SPINE FINDINGS Alignment: No traumatic malalignment. Mild degenerative anterolisthesis C2-3. Skull base and vertebrae: No acute fracture. No primary bone lesion or focal pathologic process. Generalized osteopenia Soft tissues and spinal canal: No  prevertebral fluid or swelling. No visible canal hematoma. Disc levels: Ordinary and generalized cervical spine degeneration. Notable foraminal narrowing at C2-3 and C3-4. Upper chest: Clear apical lungs IMPRESSION: 1. No evidence of acute intracranial or cervical spine injury. 2. Advanced brain atrophy and chronic small vessel disease. Electronically Signed   By: Jorje Guild M.D.   On: 04/14/2022 05:09   DG Chest Port 1 View  Result Date: 04/14/2022 CLINICAL DATA:  Level 2 trauma.  Fall on blood thinners EXAM: PORTABLE CHEST 1 VIEW COMPARISON:  02/13/2021 FINDINGS: Cardiomegaly.  Stable mediastinal contours. There is no edema, consolidation, effusion, or pneumothorax. Healed left clavicle fracture. IMPRESSION: No active disease. Electronically Signed   By: Jorje Guild M.D.   On: 04/14/2022 04:49   DG Hip Unilat W or Wo Pelvis 2-3 Views Left  Result Date: 04/14/2022 CLINICAL DATA:  Level 2 trauma EXAM: DG HIP (WITH OR WITHOUT PELVIS) 2V LEFT COMPARISON:  02/13/2021 FINDINGS: Acute intertrochanteric left femur fracture. Fracture displacement and varus angulation. The hip is located. Prior right femur fracture and repair. No evidence of pelvic ring fracture or diastasis. Generalized osteopenia. IMPRESSION: Acute and angulated intertrochanteric left femur fracture. Electronically Signed   By: Jorje Guild M.D.   On: 04/14/2022 04:48    Review of Systems  HENT:  Negative for ear discharge, ear pain, hearing loss and tinnitus.   Eyes:  Negative for photophobia and pain.  Respiratory:  Negative for cough and shortness of breath.   Cardiovascular:  Negative for chest pain.  Gastrointestinal:  Negative for abdominal pain, nausea and vomiting.  Genitourinary:  Negative for dysuria, flank pain, frequency and urgency.  Musculoskeletal:  Positive for arthralgias (Left hip). Negative for back pain, myalgias and neck pain.  Neurological:  Negative for dizziness and headaches.  Hematological:  Does not  bruise/bleed easily.  Psychiatric/Behavioral:  The patient is not nervous/anxious.    Blood pressure 120/81, pulse 97, temperature 97.6 F (36.4 C), temperature source Oral, resp. rate 19, height '5\' 2"'$  (1.575 m), weight 49.9 kg, SpO2 100 %. Physical Exam Constitutional:      General: She is not in acute distress.    Appearance: She is well-developed. She is not diaphoretic.  HENT:     Head: Normocephalic and atraumatic.  Eyes:     General: No scleral icterus.       Right eye: No discharge.        Left eye:  No discharge.     Conjunctiva/sclera: Conjunctivae normal.  Cardiovascular:     Rate and Rhythm: Normal rate and regular rhythm.  Pulmonary:     Effort: Pulmonary effort is normal. No respiratory distress.  Musculoskeletal:     Cervical back: Normal range of motion.     Comments: LLE No traumatic wounds, ecchymosis, or rash  Severe TTP hip  No knee or ankle effusion  Knee stable to varus/ valgus and anterior/posterior stress  Sens DPN, SPN, TN intact  Motor EHL, ext, flex, evers 0/5 (likely volitional)  DP 0, PT 0, No significant edema  Skin:    General: Skin is warm and dry.  Neurological:     Mental Status: She is alert.  Psychiatric:        Mood and Affect: Mood normal.        Behavior: Behavior normal.     Assessment/Plan: Left hip fx -- Plan IMN this afternoon with Dr. Sammuel Hines. Please keep NPO. Multiple medical problems including dementia and afib on Eliquis -- per primary service. Ok to restart Eliquis tomorrow.    Lisette Abu, PA-C Orthopedic Surgery [phone] 04/14/2022, 9:16 AM

## 2022-04-14 NOTE — ED Triage Notes (Signed)
Pt BIB EMS from [facility] after an unwitnessed fall on eliquis. Staff heard pt so immediately went into room, no LOC per staff. Pt was given 71mg of fentanyl x 2 with ems, O2 dropped from 100% to 60%. Pt was placed on 8lpm simple mask, O2 increased 100%. Pt has c-collar in place. Pt has left hip deformity. Hx of dementia, at baseline per staff.

## 2022-04-14 NOTE — Anesthesia Procedure Notes (Signed)
Anesthesia Regional Block: Femoral nerve block   Pre-Anesthetic Checklist: , timeout performed,  Correct Patient, Correct Site, Correct Laterality,  Correct Procedure, Correct Position, site marked,  Risks and benefits discussed,  Surgical consent,  Pre-op evaluation,  At surgeon's request and post-op pain management  Laterality: Left  Prep: chloraprep       Needles:  Injection technique: Single-shot  Needle Type: Echogenic Stimulator Needle     Needle Length: 9cm  Needle Gauge: 21     Additional Needles:   Procedures:,,,, ultrasound used (permanent image in chart),,    Narrative:  Start time: 04/14/2022 9:45 AM End time: 04/14/2022 9:50 AM Injection made incrementally with aspirations every 5 mL.  Performed by: Personally  Anesthesiologist: Effie Berkshire, MD  Additional Notes: Discussed risks and benefits of the nerve block in detail, including but not limited vascular injury, permanent nerve damage and infection.   Patient tolerated the procedure well. Local anesthetic introduced in an incremental fashion under minimal resistance after negative aspirations. No paresthesias were elicited. After completion of the procedure, no acute issues were identified and patient continued to be monitored by RN.

## 2022-04-14 NOTE — Anesthesia Procedure Notes (Signed)
Procedure Name: Intubation Date/Time: 04/14/2022 3:40 PM  Performed by: Lance Coon, CRNAPre-anesthesia Checklist: Patient identified, Emergency Drugs available, Suction available, Patient being monitored and Timeout performed Patient Re-evaluated:Patient Re-evaluated prior to induction Oxygen Delivery Method: Circle system utilized Preoxygenation: Pre-oxygenation with 100% oxygen Induction Type: IV induction Ventilation: Mask ventilation without difficulty Laryngoscope Size: Miller and 2 Grade View: Grade I Tube type: Oral Tube size: 7.0 mm Number of attempts: 1 Airway Equipment and Method: Stylet Placement Confirmation: ETT inserted through vocal cords under direct vision, positive ETCO2 and breath sounds checked- equal and bilateral Secured at: 21 cm Tube secured with: Tape Dental Injury: Teeth and Oropharynx as per pre-operative assessment

## 2022-04-14 NOTE — ED Notes (Signed)
ED TO INPATIENT HANDOFF REPORT  ED Nurse Name and Phone #: [phone]  S Name/Age/Gender Kathy Massey 89 y.o. female Room/Bed: 037C/037C  Code Status   Code Status: DNR  Home/SNF/Other Skilled nursing facility Patient oriented to: self Is this baseline? Yes   Triage Complete: Triage complete  Chief Complaint Closed left hip fracture ([place]) [S72.002A]  Triage Note Pt BIB EMS from [facility] after an unwitnessed fall on eliquis. Staff heard pt so immediately went into room, no LOC per staff. Pt was given 41mg of fentanyl x 2 with ems, O2 dropped from 100% to 60%. Pt was placed on 8lpm simple mask, O2 increased 100%. Pt has c-collar in place. Pt has left hip deformity. Hx of dementia, at baseline per staff.   Allergies Allergies  Allergen Reactions   Shellfish Allergy Itching and Swelling    Shrimp     Level of Care/Admitting Diagnosis ED Disposition     ED Disposition  Admit   Condition  --   Comment  [id number]  Level of Care: Telemetry Surgical [105]  [facility] equivalent level of care is available:: No  Covid Evaluation: Asymptomatic - no recent exposure (last 10 days) testing not required  Diagnosis: Closed left hip fracture ([facility] [[id number] Admitting Physician: SNorval Morton[[id number] Attending Physician: SNorval Morton[[id number] Certification:: I certify this patient will need inpatient services for at least 2 midnights  Estimated Length of Stay: 3          B Medical/Surgery History Past Medical History:  Diagnosis Date   Arthritis    hands -oa   Dysrhythmia    afib   Heart murmur    Hypertension    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 [place] [place]   Myocardial infarction ([facility]    06/15/15: stress induced cardiomyopathy   Past Surgical History:  Procedure Laterality Date   CARDIAC CATHETERIZATION     06/17/15 ([facility]:  No CAD, LVEDP 9 mmHg, borderline LV contraction with apical akinesis, suggestive of stress-induced CM. EF > 55% by 06/16/15 echo.   ELBOW BURSA SURGERY Right 2016   EYE SURGERY     cataracts removed ,both eyes, /w IOL   INTRAMEDULLARY (IM) NAIL INTERTROCHANTERIC Right 02/14/2021   Procedure: INTRAMEDULLARY (IM) NAIL INTERTROCHANTRIC;  Surgeon: BVanetta Mulders MD;  [facility]  Service: Orthopedics;  Laterality: Right;   LYMPH NODE BIOPSY Left 01/14/2018   Procedure: EXCISION DEEP LEFT INGUINAL LYMPH NODE BIOPSY ERAS PATHWAY;  Surgeon: IFanny Skates MD;  [facility]  Service: General;  Laterality: Left;   TONSILLECTOMY       A IV Location/Drains/Wounds Patient Lines/Drains/Airways Status     Active Line/Drains/Airways     Name Placement date Placement time Site Days   Peripheral IV 04/14/22 20 G Left Hand 04/14/22  0558  Hand  less than 1   Incision (Closed) 02/14/21 Hip Right 02/14/21  1117  -- 424            Intake/Output Last 24 hours No intake or output data in the 24 hours ending 04/14/22 1136  Labs/Imaging Results for orders placed or performed during the hospital encounter of 04/14/22 (from the past 48 hour(s))  Comprehensive metabolic panel     Status: Abnormal   Collection Time: 04/14/22  4:48 AM  Result Value Ref Range   Sodium 136 135 - 145 mmol/L   Potassium  4.2 3.5 - 5.1 mmol/L   Chloride 99 98 - 111 mmol/L   CO2 22 22 - 32 mmol/L   Glucose, Bld 117 (H) 70 - 99 mg/dL    Comment: Glucose reference range applies only to samples taken after fasting for at least 8 hours.   BUN 21 8 - 23 mg/dL   Creatinine, Ser 1.52 (H) 0.44 - 1.00 mg/dL   Calcium 9.1 8.9 - 10.3 mg/dL   Total Protein 6.7 6.5 - 8.1 g/dL   Albumin 3.5 3.5 - 5.0 g/dL   AST 25 15 - 41 U/L   ALT 9 0 - 44 U/L   Alkaline Phosphatase 141 (H) 38 - 126 U/L   Total Bilirubin 0.8 0.3 - 1.2 mg/dL   GFR, Estimated 32 (L) >60 mL/min    Comment: (NOTE) Calculated using the CKD-EPI Creatinine  Equation (2021)    Anion gap 15 5 - 15    Comment: Performed at [facility] [address], [place], [place] [address]  CBC     Status: None   Collection Time: 04/14/22  4:48 AM  Result Value Ref Range   WBC 6.3 4.0 - 10.5 K/uL   RBC 4.36 3.87 - 5.11 MIL/uL   Hemoglobin 13.1 12.0 - 15.0 g/dL   HCT 41.4 36.0 - 46.0 %   MCV 95.0 80.0 - 100.0 fL   MCH 30.0 26.0 - 34.0 pg   MCHC 31.6 30.0 - 36.0 g/dL   RDW 15.5 11.5 - 15.5 %   Platelets 275 150 - 400 K/uL   nRBC 0.0 0.0 - 0.2 %    Comment: Performed at [facility], [place] [address], [place], [place] [address]  Ethanol     Status: None   Collection Time: 04/14/22  4:48 AM  Result Value Ref Range   Alcohol, Ethyl (B) <10 <10 mg/dL    Comment: (NOTE) Lowest detectable limit for serum alcohol is 10 mg/dL.  For medical purposes only. Performed at [facility], [place] [address] [place], [place] [address]   Sample to Blood Bank     Status: None   Collection Time: 04/14/22  4:50 AM  Result Value Ref Range   Blood Bank Specimen SAMPLE AVAILABLE FOR TESTING    Sample Expiration      04/17/2022,2359 Performed at [facility], [place] [address], [place], [place] [address]   Protime-INR     Status: None   Collection Time: 04/14/22  4:57 AM  Result Value Ref Range   Prothrombin Time 13.9 11.4 - 15.2 seconds   INR 1.1 0.8 - 1.2    Comment: (NOTE) INR goal varies based on device and disease states. Performed at [facility], [place] [address] [place], [place] [address]    CT HEAD WO CONTRAST  Result Date: 04/14/2022 CLINICAL DATA:  Hip fracture EXAM: CT HEAD WITHOUT CONTRAST CT CERVICAL SPINE WITHOUT CONTRAST TECHNIQUE: Multidetector CT imaging of the head and cervical spine was performed following the standard protocol without intravenous contrast. Multiplanar CT image reconstructions of the cervical spine were also generated. RADIATION DOSE REDUCTION: This exam was performed according to the departmental  dose-optimization program which includes automated exposure control, adjustment of the mA and/or kV according to patient size and/or use of iterative reconstruction technique. COMPARISON:  01/17/2022 head CT FINDINGS: CT HEAD FINDINGS Brain: No evidence of acute infarction, hemorrhage, hydrocephalus, extra-axial collection or mass lesion/mass effect. Pronounced brain atrophy. Confluent chronic small vessel ischemia in  the cerebral white matter. Small chronic left cerebellar infarction. Vascular: No hyperdense vessel or unexpected calcification. Skull: Normal. Negative for fracture or focal lesion. Bilateral TMJ degenerative arthropathy. Sinuses/Orbits: No evidence of injury CT CERVICAL SPINE FINDINGS Alignment: No traumatic malalignment. Mild degenerative anterolisthesis C2-3. Skull base and vertebrae: No acute fracture. No primary bone lesion or focal pathologic process. Generalized osteopenia Soft tissues and spinal canal: No prevertebral fluid or swelling. No visible canal hematoma. Disc levels: Ordinary and generalized cervical spine degeneration. Notable foraminal narrowing at C2-3 and C3-4. Upper chest: Clear apical lungs IMPRESSION: 1. No evidence of acute intracranial or cervical spine injury. 2. Advanced brain atrophy and chronic small vessel disease. Electronically Signed   By: Jorje Guild M.D.   On: 04/14/2022 05:09   CT CERVICAL SPINE WO CONTRAST  Result Date: 04/14/2022 CLINICAL DATA:  Hip fracture EXAM: CT HEAD WITHOUT CONTRAST CT CERVICAL SPINE WITHOUT CONTRAST TECHNIQUE: Multidetector CT imaging of the head and cervical spine was performed following the standard protocol without intravenous contrast. Multiplanar CT image reconstructions of the cervical spine were also generated. RADIATION DOSE REDUCTION: This exam was performed according to the departmental dose-optimization program which includes automated exposure control, adjustment of the mA and/or kV according to patient size and/or use  of iterative reconstruction technique. COMPARISON:  01/17/2022 head CT FINDINGS: CT HEAD FINDINGS Brain: No evidence of acute infarction, hemorrhage, hydrocephalus, extra-axial collection or mass lesion/mass effect. Pronounced brain atrophy. Confluent chronic small vessel ischemia in the cerebral white matter. Small chronic left cerebellar infarction. Vascular: No hyperdense vessel or unexpected calcification. Skull: Normal. Negative for fracture or focal lesion. Bilateral TMJ degenerative arthropathy. Sinuses/Orbits: No evidence of injury CT CERVICAL SPINE FINDINGS Alignment: No traumatic malalignment. Mild degenerative anterolisthesis C2-3. Skull base and vertebrae: No acute fracture. No primary bone lesion or focal pathologic process. Generalized osteopenia Soft tissues and spinal canal: No prevertebral fluid or swelling. No visible canal hematoma. Disc levels: Ordinary and generalized cervical spine degeneration. Notable foraminal narrowing at C2-3 and C3-4. Upper chest: Clear apical lungs IMPRESSION: 1. No evidence of acute intracranial or cervical spine injury. 2. Advanced brain atrophy and chronic small vessel disease. Electronically Signed   By: Jorje Guild M.D.   On: 04/14/2022 05:09   DG Chest Port 1 View  Result Date: 04/14/2022 CLINICAL DATA:  Level 2 trauma.  Fall on blood thinners EXAM: PORTABLE CHEST 1 VIEW COMPARISON:  02/13/2021 FINDINGS: Cardiomegaly.  Stable mediastinal contours. There is no edema, consolidation, effusion, or pneumothorax. Healed left clavicle fracture. IMPRESSION: No active disease. Electronically Signed   By: Jorje Guild M.D.   On: 04/14/2022 04:49   DG Hip Unilat W or Wo Pelvis 2-3 Views Left  Result Date: 04/14/2022 CLINICAL DATA:  Level 2 trauma EXAM: DG HIP (WITH OR WITHOUT PELVIS) 2V LEFT COMPARISON:  02/13/2021 FINDINGS: Acute intertrochanteric left femur fracture. Fracture displacement and varus angulation. The hip is located. Prior right femur fracture and  repair. No evidence of pelvic ring fracture or diastasis. Generalized osteopenia. IMPRESSION: Acute and angulated intertrochanteric left femur fracture. Electronically Signed   By: Jorje Guild M.D.   On: 04/14/2022 04:48    Pending Labs Unresulted Labs (From admission, onward)     Start     Ordered   04/15/22 0500  CBC  Tomorrow morning,   R        04/14/22 0742   04/15/22 [id number]  Basic metabolic panel  Tomorrow morning,   R        04/14/22  [id number]   04/15/22 0500  Magnesium  Tomorrow morning,   R        04/14/22 0750            Vitals/Pain Today's Vitals   04/14/22 0945 04/14/22 0950 04/14/22 1000 04/14/22 1013  BP: 114/69 102/73 102/69   Pulse: 92 88 99   Resp: (!) 21 (!) 24 (!) 26   Temp:    98.3 F (36.8 C)  TempSrc:    Axillary  SpO2: 100% 99% 95%   Weight:      Height:        Isolation Precautions No active isolations  Medications Medications  midodrine (PROAMATINE) tablet 10 mg (10 mg Oral Given 04/14/22 1126)  amiodarone (PACERONE) tablet 100 mg (100 mg Oral Given 04/14/22 1129)  escitalopram (LEXAPRO) tablet 10 mg (10 mg Oral Given 04/14/22 1128)  memantine (NAMENDA) tablet 10 mg (10 mg Oral Given 04/14/22 1124)  HYDROcodone-acetaminophen (NORCO/VICODIN) 5-325 MG per tablet 1-2 tablet (has no administration in time range)  morphine (PF) 2 MG/ML injection 0.5 mg (has no administration in time range)  senna-docusate (Senokot-S) tablet 1 tablet (has no administration in time range)  ondansetron (ZOFRAN) injection 4 mg (has no administration in time range)  albuterol (PROVENTIL) (2.5 MG/3ML) 0.083% nebulizer solution 2.5 mg (has no administration in time range)  busPIRone (BUSPAR) tablet 10 mg (10 mg Oral Given 04/14/22 1127)  fentaNYL (SUBLIMAZE) injection 25 mcg (25 mcg Intravenous Given 04/14/22 0603)  fentaNYL (SUBLIMAZE) injection 25 mcg (25 mcg Intravenous Given 04/14/22 0657)  fentaNYL (SUBLIMAZE) injection 25 mcg (25 mcg Intravenous Given 04/14/22 [id number])     Mobility Wheelchair and walker     Focused Assessments musculoskeletal  R Recommendations: See Admitting Provider Note  Report given to:   Additional Notes: Pt have a hip fracture, she suppose to go to surgery today. Consent sign. Pt did get a nerve block place this morning.

## 2022-04-14 NOTE — ED Provider Notes (Signed)
[facility] Provider Note   CSN: [id number] Arrival date & time: 04/14/22  0422     History  Chief Complaint  Patient presents with   level 2 - fall on thinners   Level 5 caveat due to dementia Kathy Massey is a 89 y.o. female.  The history is provided by the patient and the EMS personnel.  Patient w/history of dementia, atrial fibrillation on anticoagulation presents with unwitnessed fall.  Patient stays in a local nursing facility and was found on the floor.  Patient was found to have a left hip deformity.  She was given fentanyl and had hypoxia, that improved with oxygen supplementation. Patient is unable to provide any significant history.   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 Authorizing Provider  amiodarone (PACERONE) 200 MG tablet Take 0.5 tablets (100 mg total) by mouth daily. 04/15/21   Shirley Friar, PA-C  apixaban (ELIQUIS) 2.5 MG TABS tablet TAKE 1 TABLET BY MOUTH TWICE A DAY Patient taking differently: Take 2.5 mg by mouth 2 (two) times daily. 10/26/20   Camnitz, Ocie Doyne, MD  atorvastatin (LIPITOR) 20 MG tablet Take 20 mg by mouth at bedtime. Patient not taking: Reported on 04/15/2021    [provider]  busPIRone (BUSPAR) 5 MG tablet Take 1 tablet (5 mg total) by mouth 2 (two) times daily. Patient not taking: Reported on 04/15/2021 02/24/21   Aline August, MD  cyanocobalamin 1000 MCG tablet Take 1,000 mcg by mouth every morning.    [provider]  docusate sodium (COLACE) 100 MG capsule Take 1 capsule (100 mg total) by mouth 2 (two) times daily. 02/24/21   Aline August,  MD  escitalopram (LEXAPRO) 5 MG tablet Take 5 mg by mouth every morning. 09/06/20   [provider]  fluocinonide ointment (LIDEX) [id number] % Apply 1 application topically See admin instructions. Order date 02/02/21 - apply topically to scalp twice daily Monday thru Friday until healed Patient not taking: Reported on 04/15/2021    [provider]  furosemide (LASIX) 40 MG tablet Take 1 tablet (40 mg total) by mouth daily. 02/24/21   Aline August, MD  imiquimod [place]) 5 % cream Apply topically. 05/26/21   [provider]  memantine (NAMENDA) 10 MG tablet Take 10 mg by mouth 2 (two) times daily. For memory Patient not taking: Reported on 04/15/2021 11/11/20   [provider]  methocarbamol (ROBAXIN) 500 MG tablet Take 1 tablet (500 mg total) by mouth every 6 (six) hours as needed for muscle spasms. 02/24/21   Aline August, MD  midodrine (PROAMATINE) 10 MG tablet Take 1 tablet (10 mg total) by mouth 3 (three) times daily with meals. 02/24/21   Aline August, MD  polyethylene glycol (MIRALAX / GLYCOLAX) 17 g packet Take 17 g by mouth daily. 02/24/21   Aline August, MD  QUEtiapine (SEROQUEL) 25 MG tablet Take 1 tablet (25 mg total) by mouth at bedtime. 02/24/21   Aline August, MD      Allergies    Shellfish allergy    Review of Systems   Review of Systems  Unable to perform ROS: Dementia    Physical Exam Updated Vital Signs BP 120/81   Pulse 97   Temp 97.6 F (36.4 C) (Oral)   Resp 19   Ht 1.575 m ('5\' 2"'$ )   Wt 49.9 kg   SpO2 100%   BMI 20.12 kg/m  Physical Exam CONSTITUTIONAL: Elderly and frail HEAD: Normocephalic/atraumatic EYES: EOMI/PERRL ENMT: Mucous membranes moist NECK: Cervical collar in place SPINE/BACK:entire spine nontender, No bruising/crepitance/stepoffs noted to spine CV: No loud murmurs LUNGS: Lungs are clear to auscultation bilaterally, no apparent distress ABDOMEN: soft, nontender NEURO: Pt is awake/alert moves all extremitiesx4.   No facial droop.  She is pleasantly confused EXTREMITIES: pulses normal/equalx4, obvious deformity to left hip. All other extremities/joints palpated/ranged and nontender SKIN: warm, color normal  ED Results / Procedures / Treatments   Labs (all labs ordered are listed, but only abnormal results are displayed) Labs Reviewed  COMPREHENSIVE METABOLIC PANEL - Abnormal; Notable for the following components:      Result Value   Glucose, Bld 117 (*)    Creatinine, Ser 1.52 (*)    Alkaline Phosphatase 141 (*)    GFR, Estimated 32 (*)    All other components within normal limits  CBC  ETHANOL  PROTIME-INR  SAMPLE TO BLOOD BANK    EKG EKG Interpretation  Date/Time:  Friday April 14 2022 04:33:45 EST Ventricular Rate:  90 PR Interval:    QRS Duration: 92 QT Interval:  382 QTC Calculation: 468 R Axis:   33 Text Interpretation: Atrial fibrillation RSR' in V1 or V2, right VCD or RVH Borderline T abnormalities, inferior leads Artifact in lead(s) I II aVR aVL aVF V6 Interpretation limited secondary to artifact No significant change since last tracing Confirmed by Ripley Fraise [phone]) on 04/14/2022 4:43:48 AM  Radiology CT HEAD WO CONTRAST  Result Date: 04/14/2022 CLINICAL DATA:  Hip fracture EXAM: CT HEAD WITHOUT CONTRAST CT CERVICAL SPINE WITHOUT CONTRAST TECHNIQUE: Multidetector CT imaging of the head and cervical spine was performed following the standard protocol without intravenous contrast. Multiplanar CT image reconstructions of the cervical spine were also generated. RADIATION DOSE REDUCTION: This exam was performed according to the departmental dose-optimization program which includes automated exposure control, adjustment of the mA and/or kV according to patient size and/or use of iterative reconstruction technique. COMPARISON:  01/17/2022 head CT FINDINGS: CT HEAD FINDINGS Brain: No evidence of acute infarction, hemorrhage, hydrocephalus, extra-axial collection or mass lesion/mass  effect. Pronounced brain atrophy. Confluent chronic small vessel ischemia in the cerebral white matter. Small chronic left cerebellar infarction. Vascular: No hyperdense vessel or unexpected calcification. Skull: Normal. Negative for fracture or focal lesion. Bilateral TMJ degenerative arthropathy. Sinuses/Orbits: No evidence of injury CT CERVICAL SPINE FINDINGS Alignment: No traumatic malalignment. Mild degenerative anterolisthesis C2-3. Skull base and vertebrae: No acute fracture. No primary bone lesion or focal pathologic process. Generalized osteopenia Soft tissues and spinal canal: No prevertebral fluid or swelling. No visible canal hematoma. Disc levels: Ordinary and generalized cervical spine degeneration. Notable foraminal narrowing at C2-3 and C3-4. Upper chest: Clear apical lungs IMPRESSION: 1. No evidence of acute intracranial or cervical spine injury. 2. Advanced brain atrophy and chronic small vessel disease. Electronically Signed   By: Jorje Guild M.D.   On: 04/14/2022 05:09   CT CERVICAL SPINE WO CONTRAST  Result Date: 04/14/2022 CLINICAL DATA:  Hip fracture EXAM: CT HEAD WITHOUT CONTRAST CT CERVICAL SPINE WITHOUT CONTRAST TECHNIQUE: Multidetector CT imaging of the head and cervical spine was performed following the standard protocol  without intravenous contrast. Multiplanar CT image reconstructions of the cervical spine were also generated. RADIATION DOSE REDUCTION: This exam was performed according to the departmental dose-optimization program which includes automated exposure control, adjustment of the mA and/or kV according to patient size and/or use of iterative reconstruction technique. COMPARISON:  01/17/2022 head CT FINDINGS: CT HEAD FINDINGS Brain: No evidence of acute infarction, hemorrhage, hydrocephalus, extra-axial collection or mass lesion/mass effect. Pronounced brain atrophy. Confluent chronic small vessel ischemia in the cerebral white matter. Small chronic left cerebellar  infarction. Vascular: No hyperdense vessel or unexpected calcification. Skull: Normal. Negative for fracture or focal lesion. Bilateral TMJ degenerative arthropathy. Sinuses/Orbits: No evidence of injury CT CERVICAL SPINE FINDINGS Alignment: No traumatic malalignment. Mild degenerative anterolisthesis C2-3. Skull base and vertebrae: No acute fracture. No primary bone lesion or focal pathologic process. Generalized osteopenia Soft tissues and spinal canal: No prevertebral fluid or swelling. No visible canal hematoma. Disc levels: Ordinary and generalized cervical spine degeneration. Notable foraminal narrowing at C2-3 and C3-4. Upper chest: Clear apical lungs IMPRESSION: 1. No evidence of acute intracranial or cervical spine injury. 2. Advanced brain atrophy and chronic small vessel disease. Electronically Signed   By: Jorje Guild M.D.   On: 04/14/2022 05:09   DG Chest Port 1 View  Result Date: 04/14/2022 CLINICAL DATA:  Level 2 trauma.  Fall on blood thinners EXAM: PORTABLE CHEST 1 VIEW COMPARISON:  02/13/2021 FINDINGS: Cardiomegaly.  Stable mediastinal contours. There is no edema, consolidation, effusion, or pneumothorax. Healed left clavicle fracture. IMPRESSION: No active disease. Electronically Signed   By: Jorje Guild M.D.   On: 04/14/2022 04:49   DG Hip Unilat W or Wo Pelvis 2-3 Views Left  Result Date: 04/14/2022 CLINICAL DATA:  Level 2 trauma EXAM: DG HIP (WITH OR WITHOUT PELVIS) 2V LEFT COMPARISON:  02/13/2021 FINDINGS: Acute intertrochanteric left femur fracture. Fracture displacement and varus angulation. The hip is located. Prior right femur fracture and repair. No evidence of pelvic ring fracture or diastasis. Generalized osteopenia. IMPRESSION: Acute and angulated intertrochanteric left femur fracture. Electronically Signed   By: Jorje Guild M.D.   On: 04/14/2022 04:48    Procedures Procedures    Medications Ordered in ED Medications  fentaNYL (SUBLIMAZE) injection 25 mcg (25  mcg Intravenous Given 04/14/22 0603)  fentaNYL (SUBLIMAZE) injection 25 mcg (25 mcg Intravenous Given 04/14/22 0657)    ED Course/ Medical Decision Making/ A&P Clinical Course as of 04/14/22 0728  Fri Apr 14, 2022  0439 Patient presents as a level 2 trauma due to unwitnessed fall on anticoagulation.  Biggest concern is that she has obvious hip deformity.  Will proceed with full CT imaging of head and C-spine due to unwitnessed fall and unreliable historian.  CT imaging will be delayed due to downtime of the CT scanner [DW]  0502 Discussed care with her daughter.  She will come visit the patient [DW]  0549 Creatinine(!): 1.52 Renal insufficiency [DW]  0549 Discussed with Dr. Lorin Mercy with Ortho care.  They will follow along with patient for likely operative management [DW]  0727 Discussed with Dr. Fuller Plan for admission [DW]    Clinical Course User Index [DW] Ripley Fraise, MD                             Medical Decision Making Amount and/or Complexity of Data Reviewed Labs: ordered. Decision-making details documented in ED Course. Radiology: ordered. ECG/medicine tests: ordered.  Risk Prescription drug management. Decision regarding  hospitalization.   This patient presents to the ED for concern of fall, this involves an extensive number of treatment options, and is a complaint that carries with it a high risk of complications and morbidity.  The differential diagnosis includes but is not limited to skull fracture, subdural hematoma, intracranial hemorrhage, cervical spine fracture, left hip fracture  Comorbidities that complicate the patient evaluation: Patient's presentation is complicated by their history of atrial fibrillation, dementia  Social Determinants of Health: Patient's  poor mobility   increases the complexity of managing their presentation  Additional history obtained: Additional history obtained from EMS  Records reviewed  orthopedic notes reviewed  Lab  Tests: I Ordered, and personally interpreted labs.  The pertinent results include: Renal insufficiency  Imaging Studies ordered: I ordered imaging studies including CT scan head and C-spine and X-ray left hip   I independently visualized and interpreted imaging which showed close left hip fracture I agree with the radiologist interpretation  Cardiac Monitoring: The patient was maintained on a cardiac monitor.  I personally viewed and interpreted the cardiac monitor which showed an underlying rhythm of:  Atrial Fibrillation  Medicines ordered and prescription drug management: I ordered medication including fentanyl for pain Reevaluation of the patient after these medicines showed that the patient    stayed the same   Critical Interventions:   admission for operative management fracture  Consultations Obtained: I requested consultation with the admitting physician Dr. Tamala Julian and consultant Dr. Lorin Mercy , and discussed  findings as well as pertinent plan - they recommend: Mid to hospitalist for operative management  Reevaluation: After the interventions noted above, I reevaluated the patient and found that they have :stayed the same  Complexity of problems addressed: Patient's presentation is most consistent with  acute presentation with potential threat to life or bodily function  Disposition: After consideration of the diagnostic results and the patient's response to treatment,  I feel that the patent would benefit from admission   .           Final Clinical Impression(s) / ED Diagnoses Final diagnoses:  Closed fracture of left hip, initial encounter [facility])    Rx / [place] Orders ED Discharge Orders     None         Ripley Fraise, MD 04/14/22 [phone]

## 2022-04-14 NOTE — Progress Notes (Signed)
Orthopedic Tech Progress Note Patient Details:  Kathy Massey 01/14/1928 [id number]  Level 2 trauma   Patient ID: Kathy Massey, female   DOB: 1927/09/16, 89 y.o.   MRN: [id number]  Kathy Massey 04/14/2022, 4:37 AM

## 2022-04-14 NOTE — H&P (Signed)
ORTHOPAEDIC CONSULTATION  REQUESTING PHYSICIAN: Norval Morton, MD  Chief Complaint: Left hip pain  HPI: Kathy Massey is a 89 y.o. female who presents with presents today after fall where she is found to have a left intertrochanteric hip fracture.  She was at [facility] burn where she resides.  She usually walks with a walker but had forgot in this case.  She presents today for definitive treatment of her hip.  She is status post intertrochanteric nailing of the right hip 1 year prior.  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 EF > 55% by 06/16/15 echo.   ELBOW BURSA SURGERY Right 2016   EYE SURGERY     cataracts removed ,both eyes, /w IOL   INTRAMEDULLARY (IM) NAIL INTERTROCHANTERIC Right 02/14/2021   Procedure: INTRAMEDULLARY (IM) NAIL INTERTROCHANTRIC;  Surgeon: Vanetta Mulders, MD;  Location: [place];  Service: Orthopedics;  Laterality: Right;   LYMPH NODE BIOPSY Left 01/14/2018   Procedure: EXCISION DEEP LEFT INGUINAL LYMPH NODE BIOPSY ERAS PATHWAY;  Surgeon: Fanny Skates, MD;  Location: [place];  Service: General;  Laterality: Left;   TONSILLECTOMY     Social History   Socioeconomic History   Marital status: Widowed    Spouse name: Not on file   Number of children: Not on file   Years of education: Not on file   Highest education level: Not on file  Occupational History   Not on file  Tobacco Use   Smoking status: Never   Smokeless tobacco: Never   Tobacco comments:    never used tobacco  Vaping Use   Vaping Use: Never used  Substance and Sexual  Activity   Alcohol use: Yes    Alcohol/week: 1.0 standard drink of alcohol    Types: 1 Glasses of wine per week   Drug use: No   Sexual activity: Not on file  Other Topics Concern   Not on file  Social History Narrative   Not on file   Social Determinants of Health   Financial Resource Strain: Not on file  Food Insecurity: Not on file  Transportation Needs: Not on file  Physical Activity: Not on file  Stress: Not on file  Social Connections: Not on file   Family History  Problem Relation Age of Onset   Heart disease Mother    Depression Mother    Heart failure Mother    Heart disease Father    Alcohol abuse Father    Heart attack Father    Depression Father    Multiple myeloma Maternal Grandmother    Alcohol abuse Maternal Grandfather    Breast cancer Paternal Grandmother    Breast cancer Daughter    - negative except otherwise stated in the family history section Allergies  Allergen Reactions   Shellfish Allergy Itching and Swelling    Shrimp    Prior to Admission medications   Medication Sig Start Date End Date Taking? Authorizing Provider  acetaminophen (TYLENOL) 325 MG tablet Take 650 mg by mouth every 4 (four) hours as  needed for moderate pain.   Yes [provider]  amiodarone (PACERONE) 200 MG tablet Take 0.5 tablets (100 mg total) by mouth daily. 04/15/21  Yes Shirley Friar, PA-C  apixaban (ELIQUIS) 2.5 MG TABS tablet TAKE 1 TABLET BY MOUTH TWICE A DAY Patient taking differently: Take 2.5 mg by mouth 2 (two) times daily. 10/26/20  Yes Camnitz, Will Hassell Done, MD  busPIRone (BUSPAR) 10 MG tablet Take 10 mg by mouth 3 (three) times daily.   Yes [provider]  cyanocobalamin 1000 MCG tablet Take 1,000 mcg by mouth daily.   Yes [provider]  docusate sodium (COLACE) 100 MG capsule Take 1 capsule (100 mg total) by mouth 2 (two) times daily. 02/24/21  Yes Aline August, MD  furosemide (LASIX) 40 MG tablet Take 1 tablet (40 mg  total) by mouth daily. 02/24/21  Yes Aline August, MD  memantine (NAMENDA) 10 MG tablet Take 10 mg by mouth 2 (two) times daily. For memory 11/11/20  Yes [provider]  midodrine (PROAMATINE) 10 MG tablet Take 1 tablet (10 mg total) by mouth 3 (three) times daily with meals. 02/24/21  Yes Aline August, MD  NON FORMULARY Fluid restriction of 1200 mL   Yes [provider]  polyethylene glycol (MIRALAX / GLYCOLAX) 17 g packet Take 17 g by mouth daily. 02/24/21  Yes Aline August, MD  traMADol (ULTRAM) 50 MG tablet Take 25 mg by mouth 2 (two) times daily as needed for moderate pain.   Yes [provider]  busPIRone (BUSPAR) 5 MG tablet Take 1 tablet (5 mg total) by mouth 2 (two) times daily. Patient not taking: Reported on 04/15/2021 02/24/21   Aline August, MD  escitalopram (LEXAPRO) 5 MG tablet Take 5 mg by mouth every morning. Patient not taking: Reported on 04/14/2022 09/06/20   [provider]  methocarbamol (ROBAXIN) 500 MG tablet Take 1 tablet (500 mg total) by mouth every 6 (six) hours as needed for muscle spasms. Patient not taking: Reported on 04/14/2022 02/24/21   Aline August, MD   CT HEAD WO CONTRAST  Result Date: 04/14/2022 CLINICAL DATA:  Hip fracture EXAM: CT HEAD WITHOUT CONTRAST CT CERVICAL SPINE WITHOUT CONTRAST TECHNIQUE: Multidetector CT imaging of the head and cervical spine was performed following the standard protocol without intravenous contrast. Multiplanar CT image reconstructions of the cervical spine were also generated. RADIATION DOSE REDUCTION: This exam was performed according to the departmental dose-optimization program which includes automated exposure control, adjustment of the mA and/or kV according to patient size and/or use of iterative reconstruction technique. COMPARISON:  01/17/2022 head CT FINDINGS: CT HEAD FINDINGS Brain: No evidence of acute infarction, hemorrhage, hydrocephalus, extra-axial collection or mass lesion/mass  effect. Pronounced brain atrophy. Confluent chronic small vessel ischemia in the cerebral white matter. Small chronic left cerebellar infarction. Vascular: No hyperdense vessel or unexpected calcification. Skull: Normal. Negative for fracture or focal lesion. Bilateral TMJ degenerative arthropathy. Sinuses/Orbits: No evidence of injury CT CERVICAL SPINE FINDINGS Alignment: No traumatic malalignment. Mild degenerative anterolisthesis C2-3. Skull base and vertebrae: No acute fracture. No primary bone lesion or focal pathologic process. Generalized osteopenia Soft tissues and spinal canal: No prevertebral fluid or swelling. No visible canal hematoma. Disc levels: Ordinary and generalized cervical spine degeneration. Notable foraminal narrowing at C2-3 and C3-4. Upper chest: Clear apical lungs IMPRESSION: 1. No evidence of acute intracranial or cervical spine injury. 2. Advanced brain atrophy and chronic small vessel disease. Electronically Signed   By: Gilford Silvius.D.  On: 04/14/2022 05:09   CT CERVICAL SPINE WO CONTRAST  Result Date: 04/14/2022 CLINICAL DATA:  Hip fracture EXAM: CT HEAD WITHOUT CONTRAST CT CERVICAL SPINE WITHOUT CONTRAST TECHNIQUE: Multidetector CT imaging of the head and cervical spine was performed following the standard protocol without intravenous contrast. Multiplanar CT image reconstructions of the cervical spine were also generated. RADIATION DOSE REDUCTION: This exam was performed according to the departmental dose-optimization program which includes automated exposure control, adjustment of the mA and/or kV according to patient size and/or use of iterative reconstruction technique. COMPARISON:  01/17/2022 head CT FINDINGS: CT HEAD FINDINGS Brain: No evidence of acute infarction, hemorrhage, hydrocephalus, extra-axial collection or mass lesion/mass effect. Pronounced brain atrophy. Confluent chronic small vessel ischemia in the cerebral white matter. Small chronic left cerebellar  infarction. Vascular: No hyperdense vessel or unexpected calcification. Skull: Normal. Negative for fracture or focal lesion. Bilateral TMJ degenerative arthropathy. Sinuses/Orbits: No evidence of injury CT CERVICAL SPINE FINDINGS Alignment: No traumatic malalignment. Mild degenerative anterolisthesis C2-3. Skull base and vertebrae: No acute fracture. No primary bone lesion or focal pathologic process. Generalized osteopenia Soft tissues and spinal canal: No prevertebral fluid or swelling. No visible canal hematoma. Disc levels: Ordinary and generalized cervical spine degeneration. Notable foraminal narrowing at C2-3 and C3-4. Upper chest: Clear apical lungs IMPRESSION: 1. No evidence of acute intracranial or cervical spine injury. 2. Advanced brain atrophy and chronic small vessel disease. Electronically Signed   By: Jorje Guild M.D.   On: 04/14/2022 05:09   DG Chest Port 1 View  Result Date: 04/14/2022 CLINICAL DATA:  Level 2 trauma.  Fall on blood thinners EXAM: PORTABLE CHEST 1 VIEW COMPARISON:  02/13/2021 FINDINGS: Cardiomegaly.  Stable mediastinal contours. There is no edema, consolidation, effusion, or pneumothorax. Healed left clavicle fracture. IMPRESSION: No active disease. Electronically Signed   By: Jorje Guild M.D.   On: 04/14/2022 04:49   DG Hip Unilat W or Wo Pelvis 2-3 Views Left  Result Date: 04/14/2022 CLINICAL DATA:  Level 2 trauma EXAM: DG HIP (WITH OR WITHOUT PELVIS) 2V LEFT COMPARISON:  02/13/2021 FINDINGS: Acute intertrochanteric left femur fracture. Fracture displacement and varus angulation. The hip is located. Prior right femur fracture and repair. No evidence of pelvic ring fracture or diastasis. Generalized osteopenia. IMPRESSION: Acute and angulated intertrochanteric left femur fracture. Electronically Signed   By: Jorje Guild M.D.   On: 04/14/2022 04:48     Positive ROS: All other systems have been reviewed and were otherwise negative with the exception of those  mentioned in the HPI and as above.  Physical Exam: General: No acute distress Cardiovascular: No pedal edema Respiratory: No cyanosis, no use of accessory musculature GI: No organomegaly, abdomen is soft and non-tender Skin: No lesions in the area of chief complaint Neurologic: Sensation intact distally Psychiatric: Patient is at baseline mood and affect Lymphatic: No axillary or c3ervical lymphadenopathy  MUSCULOSKELETAL:  Left hip is shortened externally rotated.  Distal neurovascular exam is intact.  2+ dorsalis pedis pulse  Independent Imaging Review: X-ray 2 views left hip: Displaced and shortened left intertrochanteric hip fracture  Assessment: 89 year old female with a left intertrochanteric hip fracture after a fall.  At this time I have recommended surgical intervention given her mobility status to increase early ability to ambulate and put weight on the leg.  Will plan to proceed with this.  I discussed this with her daughter.  Plan: Plan for left hip cephalomedullary nail   After a lengthy discussion of treatment options, including risks, benefits,  alternatives, complications of surgical and nonsurgical conservative options, the patient elected surgical repair.   The patient  is aware of the material risks  and complications including, but not limited to injury to adjacent structures, neurovascular injury, infection, numbness, bleeding, implant failure, thermal burns, stiffness, persistent pain, failure to heal, disease transmission from allograft, need for further surgery, dislocation, anesthetic risks, blood clots, risks of death,and others. The probabilities of surgical success and failure discussed with patient given their particular co-morbidities.The time and nature of expected rehabilitation and recovery was discussed.The patient's questions were all answered preoperatively.  No barriers to understanding were noted. I explained the natural history of the disease process  and Rx rationale.  I explained to the patient what I considered to be reasonable expectations given their personal situation.  The final treatment plan was arrived at through a shared patient decision making process model.   Thank you for the consult and the opportunity to see Kathy Massey  Vanetta Mulders, MD [facility] 3:21 PM

## 2022-04-15 DIAGNOSIS — W19XXXA Unspecified fall, initial encounter: Secondary | ICD-10-CM | POA: Diagnosis not present

## 2022-04-15 DIAGNOSIS — I4821 Permanent atrial fibrillation: Secondary | ICD-10-CM | POA: Diagnosis not present

## 2022-04-15 DIAGNOSIS — S72002A Fracture of unspecified part of neck of left femur, initial encounter for closed fracture: Secondary | ICD-10-CM | POA: Diagnosis not present

## 2022-04-15 DIAGNOSIS — N1832 Chronic kidney disease, stage 3b: Secondary | ICD-10-CM | POA: Insufficient documentation

## 2022-04-15 DIAGNOSIS — Z7901 Long term (current) use of anticoagulants: Secondary | ICD-10-CM | POA: Diagnosis not present

## 2022-04-15 LAB — CBC
HCT: 31 % — ABNORMAL LOW (ref 36.0–46.0)
HCT: 33 % — ABNORMAL LOW (ref 36.0–46.0)
Hemoglobin: 9.9 g/dL — ABNORMAL LOW (ref 12.0–15.0)
Hemoglobin: 10.3 g/dL — ABNORMAL LOW (ref 12.0–15.0)
MCH: 30.2 pg (ref 26.0–34.0)
MCH: 29.7 pg (ref 26.0–34.0)
MCHC: 31.9 g/dL (ref 30.0–36.0)
MCHC: 31.2 g/dL (ref 30.0–36.0)
MCV: 94.5 fL (ref 80.0–100.0)
MCV: 95.1 fL (ref 80.0–100.0)
Platelets: 226 10*3/uL (ref 150–400)
Platelets: 276 10*3/uL (ref 150–400)
RBC: 3.28 MIL/uL — ABNORMAL LOW (ref 3.87–5.11)
RBC: 3.47 MIL/uL — ABNORMAL LOW (ref 3.87–5.11)
RDW: 15.7 % — ABNORMAL HIGH (ref 11.5–15.5)
RDW: 15.7 % — ABNORMAL HIGH (ref 11.5–15.5)
WBC: 7 10*3/uL (ref 4.0–10.5)
WBC: 10.2 10*3/uL (ref 4.0–10.5)
nRBC: 0 % (ref 0.0–0.2)
nRBC: 0 % (ref 0.0–0.2)

## 2022-04-15 LAB — BASIC METABOLIC PANEL
Anion gap: 10 (ref 5–15)
BUN: 23 mg/dL (ref 8–23)
CO2: 23 mmol/L (ref 22–32)
Calcium: 8.6 mg/dL — ABNORMAL LOW (ref 8.9–10.3)
Chloride: 103 mmol/L (ref 98–111)
Creatinine, Ser: 1.78 mg/dL — ABNORMAL HIGH (ref 0.44–1.00)
GFR, Estimated: 26 mL/min — ABNORMAL LOW (ref >60)
Glucose, Bld: 136 mg/dL — ABNORMAL HIGH (ref 70–99)
Potassium: 4.5 mmol/L (ref 3.5–5.1)
Sodium: 136 mmol/L (ref 135–145)

## 2022-04-15 LAB — MAGNESIUM: Magnesium: 2.3 mg/dL (ref 1.7–2.4)

## 2022-04-15 MED ORDER — SODIUM CHLORIDE 0.9 % IV SOLN: INTRAVENOUS | Status: DC

## 2022-04-15 MED ORDER — ACETAMINOPHEN 325 MG PO TABS
650.0000 mg | ORAL_TABLET | Freq: Four times a day (QID) | ORAL | Status: DC
  Administered 2022-04-15 – 2022-04-18 (×11): 650 mg via ORAL
  Filled 2022-04-15 (×11): qty 2

## 2022-04-15 MED ORDER — SENNOSIDES-DOCUSATE SODIUM 8.6-50 MG PO TABS
1.0000 | ORAL_TABLET | Freq: Every day | ORAL | Status: DC
  Administered 2022-04-15 – 2022-04-18 (×4): 1 via ORAL
  Filled 2022-04-15 (×4): qty 1

## 2022-04-15 MED ORDER — ORAL CARE MOUTH RINSE: 15.0000 mL | OROMUCOSAL | Status: DC | PRN

## 2022-04-15 MED ORDER — ACETAMINOPHEN 325 MG PO TABS
650.0000 mg | ORAL_TABLET | Freq: Four times a day (QID) | ORAL | Status: DC | PRN
  Administered 2022-04-15: 650 mg via ORAL
  Filled 2022-04-15: qty 2

## 2022-04-15 NOTE — Progress Notes (Signed)
   Subjective:  Patient reports pain well controlled. Sleeping on arrival. Tolerated little PO. Stable overnight, pending labs this AM.  Objective:   VITALS:   Vitals:   04/14/22 2117 04/14/22 2329 04/15/22 0229 04/15/22 0527  BP: 102/71 (!) 110/92 121/85 112/86  Pulse: 75 89 86 87  Resp: '16 17 15 13  '$ Temp: 98 F (36.7 C) 97.9 F (36.6 C)  (!) 97.4 F (36.3 C)  TempSrc: Oral Oral  Axillary  SpO2: 100% 100% 100% 100%  Weight:    47.1 kg  Height:        Dressing CDI, SILT throughout, leg lengths equal  Lab Results  Component Value Date   WBC 6.3 04/14/2022   HGB 13.1 04/14/2022   HCT 41.4 04/14/2022   MCV 95.0 04/14/2022   PLT 275 04/14/2022     Assessment/Plan:  1 Day Post-Op status post left hip nailing, doing well  - Expected postop acute blood loss anemia - check CBC this AM - Patient to work with PT to optimize mobilization safely - DVT ppx - SCDs, ambulation, Eliquis - Postoperative Abx: Ancef x 2 additional doses given - WBAT operative extremity - Pain control - multimodal pain management, ATC acetaminophen in conjunction with as needed narcotic (oxycodone), although this should be minimized with other modalities  - Discharge planning pending CM, appreciate coordination    Kathy Massey 04/15/2022, 7:30 AM

## 2022-04-15 NOTE — Plan of Care (Signed)
  Problem: Skin Integrity: Goal: Risk for impaired skin integrity will decrease Outcome: Progressing   Problem: Safety: Goal: Ability to remain free from injury will improve Outcome: Progressing

## 2022-04-15 NOTE — Anesthesia Postprocedure Evaluation (Signed)
Anesthesia Post Note  Patient: Kathy Massey  Procedure(s) Performed: INTRAMEDULLARY (IM) NAIL INTERTROCHANTERIC (Left: Hip)     Patient location during evaluation: PACU Anesthesia Type: General Level of consciousness: awake Pain management: pain level controlled Vital Signs Assessment: post-procedure vital signs reviewed and stable Respiratory status: spontaneous breathing, nonlabored ventilation and respiratory function stable Cardiovascular status: blood pressure returned to baseline and stable Postop Assessment: no apparent nausea or vomiting Anesthetic complications: no   No notable events documented.  Last Vitals:  Vitals:   04/15/22 0229 04/15/22 0527  BP: 121/85 112/86  Pulse: 86 87  Resp: 15 13  Temp:  (!) 36.3 C  SpO2: 100% 100%    Last Pain:  Vitals:   04/15/22 0527  TempSrc: Axillary  PainSc:                  Karyl Kinnier Gwendalyn Mcgonagle

## 2022-04-15 NOTE — Progress Notes (Addendum)
PROGRESS NOTE  Kathy Massey [id number] DOB: 08/11/1927   PCP: [name], MD  Patient is from: Kathy Massey via EMS   DOA: 04/14/2022 LOS: 1  Chief complaints Chief Complaint  Patient presents with   level 2 - fall on thinners     Brief Narrative / Interim history: 89 year old F with PMH of Alzheimer's dementia, systolic CHF, A-fib on Eliquis, marginal zone lymphoma, CKD-3B, HTN and anxiety brought to ED after unwitnessed fall and left hip fracture, and admitted.  X-ray confirmed left hip fracture.  CT head and neck without acute finding.  Patient underwent left hip cephalomedullary nail by Dr. Sammuel Hines on 04/14/2022.   Subjective: Seen and examined earlier this morning.  No major events overnight of this morning.  Patient is awake and alert but confused.  Responds no to pain.  Does not appear to be in distress.  Objective: Vitals:   04/14/22 2329 04/15/22 0229 04/15/22 0527 04/15/22 0700  BP: (!) 110/92 121/85 112/86 (!) 98/53  Pulse: 89 86 87   Resp: '17 15 13   '$ Temp: 97.9 F (36.6 C)  (!) 97.4 F (36.3 C) (!) 97.5 F (36.4 C)  TempSrc: Oral  Axillary Oral  SpO2: 100% 100% 100%   Weight:   47.1 kg   Height:        Examination:  GENERAL: Appears frail.  No apparent distress. HEENT: MMM.  Vision and hearing grossly intact.  NECK: Supple.  No apparent JVD.  RESP:  No IWOB.  Fair aeration bilaterally. CVS:  RRR. Heart sounds normal.  ABD/GI/GU: BS+. Abd soft, NTND.  MSK/EXT:  Moves extremities. No apparent deformity. No edema.  SKIN: Dressing over left hip DCI. NEURO: Awake, alert and oriented appropriately.  No apparent focal neuro deficit. PSYCH: Calm. Normal affect.   Procedures:  3/1-left hip cephalomedullary nail by Dr. Sammuel Hines  Microbiology summarized: MRSA PCR screen nonreactive.  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 Sammuel Hines on 3/1 -Tylenol 650 mg every 6 hours while awake.  The rest per surgery. -Discontinued full dose aspirin.  Already on low-dose Eliquis. -PT/OT-she is WBAT on operative extremity  Chronic systolic CHF: TTE in [id number] with LVEF of 30 to 35%, GH, severe LAE, severe RAE, moderate MVR and severe TVR.  Appears dry on exam.  On p.o. Lasix at home. -Hold p.o. Lasix -Monitor respiratory status while on gentle IV fluid -Strict intake and output, daily weight, renal functions and electrolytes  Persistent A-fib: Rate controlled. -Continue home amiodarone and low-dose Eliquis -Optimize electrolytes -Discontinue telemetry   CKD-3B: Cr slightly up today.  Baseline about 1.5.  At risk for dehydration due to dementia. Recent Labs    04/15/21 1209 06/16/21 1511 07/10/21 1019 12/16/21 1510 04/14/22 0448 04/15/22 0700  BUN 16 24* '17 20 21 23  '$ CREATININE 1.24* 1.53* 1.34* 1.51* 1.52* 1.78*  -IV NS at 60 cc an hour -Discontinue p.o. Lasix. -Avoid nephrotoxic meds. -Recheck in the morning -Further workup if no improvement or worse.  Acute postoperative blood loss anemia: Hgb dropped  about 3 g.  EBL reported to be 50 cc.  No signs of bleeding.  With dilutional? Recent Labs    04/15/21 1209 06/16/21 1511 07/10/21 1019 12/16/21 1510 04/14/22 0448 04/15/22 0700  HGB 13.1 13.4 14.3 13.6 13.1 9.9*  -Monitor H&H -Check anemia panel in the morning  Mixed Alzheimer's and vascular dementia:  She is awake and alert but disoriented.  No behavioral disturbance. -Reorientation and delirium precautions -Continue Namenda,  Lexapro, and BuSpar   Marginal zone lymphoma of the intra-abdominal lymph nodes: Followed by Dr. Marin Olp.  Currently not on treatment -Outpatient follow-up.  Elevated alkaline phosphatase: Mild.   Goal of care/DNR-confirmed.  Has ACP note.  Increased nutrient needs: Body mass index is 18.99 kg/m. Nutrition Problem: Increased nutrient needs Etiology: hip fracture Signs/Symptoms: estimated needs     DVT prophylaxis:  apixaban (ELIQUIS) tablet 2.5 mg Start: 04/15/22 1000 apixaban (ELIQUIS) tablet 2.5 mg  Code Status: DNR/DNI Family Communication: None at bedside Level of care: Med-Surg Status is: Inpatient Remains inpatient appropriate because: Left hip fracture   Final disposition: Likely SNF Consultants:  Orthopedic surgery  35 minutes with more than 50% spent in reviewing records, counseling patient/family and coordinating care.   Sch Meds:  Scheduled Meds:  acetaminophen  650 mg Oral Q6H WA   amiodarone  100 mg Oral Daily   apixaban  2.5 mg Oral BID   busPIRone  10 mg Oral TID   escitalopram  10 mg Oral q morning   feeding supplement  237 mL Oral BID BM   memantine  10 mg Oral BID   midodrine  10 mg Oral TID WC   multivitamin with minerals  1 tablet Oral Daily   polyethylene glycol  17 g Oral Daily   senna-docusate  1 tablet Oral Daily   Continuous Infusions:  sodium chloride     PRN Meds:.albuterol, HYDROcodone-acetaminophen, methocarbamol, morphine injection, ondansetron (ZOFRAN) IV, mouth rinse  Antimicrobials: Anti-infectives (From admission, onward)    Start     Dose/Rate Route Frequency Ordered Stop   04/15/22 0600  ceFAZolin (ANCEF) IVPB 2g/100 mL premix        2 g 200 mL/hr over 30 Minutes Intravenous On call to O.R. 04/14/22 1204 04/14/22 1545   04/14/22 2200  ceFAZolin (ANCEF) IVPB 2g/100 mL premix        2 g 200 mL/hr over 30 Minutes Intravenous Every 8 hours 04/14/22 1752 04/15/22 0630        I have personally reviewed the following  labs and images: CBC: Recent Labs  Lab 04/14/22 0448 04/15/22 0700  WBC 6.3 7.0  HGB 13.1 9.9*  HCT 41.4 31.0*  MCV 95.0 94.5  PLT 275 226   BMP &GFR Recent Labs  Lab 04/14/22 0448 04/15/22 0700  NA 136 136  K 4.2 4.5  CL 99 103  CO2 22 23  GLUCOSE 117* 136*  BUN 21 23  CREATININE 1.52* 1.78*  CALCIUM 9.1 8.6*  MG  --  2.3   Estimated Creatinine Clearance: 15 mL/min (A) (by C-G formula based on SCr of 1.78 mg/dL (H)). Liver & Pancreas: Recent Labs  Lab 04/14/22 0448  AST 25  ALT 9  ALKPHOS 141*  BILITOT 0.8  PROT 6.7  ALBUMIN 3.5   No results for input(s): "LIPASE", "AMYLASE" in the last 168 hours. No results for input(s): "AMMONIA" in the last 168 hours. Diabetic: No results for input(s): "HGBA1C" in the last 72 hours. No results for input(s): "GLUCAP" in the last 168 hours.  Cardiac Enzymes: No results for input(s): "CKTOTAL", "CKMB", "CKMBINDEX", "TROPONINI" in the last 168 hours. No results for input(s): "PROBNP" in the last 8760 hours. Coagulation Profile: Recent Labs  Lab 04/14/22 0457  INR 1.1   Thyroid Function Tests: No results for input(s): "TSH", "T4TOTAL", "FREET4", "T3FREE", "THYROIDAB" in the last 72 hours. Lipid Profile: No results for input(s): "CHOL", "HDL", "LDLCALC", "TRIG", "CHOLHDL", "LDLDIRECT" in the last 72 hours. Anemia Panel: No results for input(s): "VITAMINB12", "FOLATE", "FERRITIN", "TIBC", "IRON", "RETICCTPCT" in the last 72 hours. Urine analysis:    Component Value Date/Time   COLORURINE YELLOW 10/22/2019 1537   APPEARANCEUR CLEAR 10/22/2019 1537   LABSPEC 1.025 10/22/2019 1537   PHURINE 5.5 10/22/2019 1537   GLUCOSEU NEGATIVE 10/22/2019 1537   HGBUR TRACE (A) 10/22/2019 1537   BILIRUBINUR NEGATIVE 10/22/2019 1537   KETONESUR NEGATIVE 10/22/2019 1537   PROTEINUR NEGATIVE 10/22/2019 1537   NITRITE NEGATIVE 10/22/2019 1537   LEUKOCYTESUR SMALL (A) 10/22/2019 1537   Sepsis Labs: Invalid input(s): "PROCALCITONIN",  "LACTICIDVEN"  Microbiology: Recent Results (from the past 240 hour(s))  MRSA Next Gen by PCR, Nasal     Status: None   Collection Time: 04/14/22  1:17 PM   Specimen: Nasal Mucosa; Nasal Swab  Result Value Ref Range Status   MRSA by PCR Next Gen NOT DETECTED NOT DETECTED Final    Comment: (NOTE) The GeneXpert MRSA Assay (FDA approved for NASAL specimens only), is one component of a comprehensive MRSA colonization surveillance program. It is not intended to diagnose MRSA infection nor to guide or monitor treatment for MRSA infections. Test performance is not FDA approved in patients less than 53 years old. Performed at [facility], [place] [address], [place], [place] [address]     Radiology Studies: DG FEMUR MIN 2 VIEWS LEFT  Result Date: 04/14/2022 CLINICAL DATA:  Fluoroscopic assistance for internal fixation of left femur EXAM: LEFT FEMUR 2 VIEWS COMPARISON:  Radiographs done earlier today FINDINGS: There is interval reduction and internal fixation of comminuted intertrochanteric fracture of left femur with intramedullary rod. Fluoroscopic time 180.6 seconds. Radiation dose 10.65 mGy. IMPRESSION: Fluoroscopic assistance was provided for reduction and internal fixation of intertrochanteric fracture of left femur. Electronically Signed   By: Elmer Picker M.D.   On: 04/14/2022 17:46   DG C-Arm 1-60 Min-No Report  Result Date: 04/14/2022 Fluoroscopy was utilized by the requesting physician.  No radiographic interpretation.      Reighn Kaplan T. [place]  If 7PM-7AM, please contact night-coverage [email or web address] 04/15/2022, 10:17 AM

## 2022-04-15 NOTE — Evaluation (Signed)
Physical Therapy Evaluation Patient Details Name: Kathy Massey MRN: [id number] DOB: 12-18-1927 Today's Date: 04/15/2022  History of Present Illness  89 year old F admitted 3/1 from memory care at [facility] after a fall resulting in Lt hip fx, s/p left hip cephalomedullary nail by Dr. Sammuel Hines 3/1. PMH of Alzheimer's dementia, systolic CHF, A-fib on Eliquis, marginal zone lymphoma, CKD-3B, HTN and anxiety.  Clinical Impression  Patient is s/p above surgery resulting in functional limitations due to the deficits listed below (see PT Problem List). Per daughter pt able to amb short distances with RW, but used a w/c at memory unit at [facility]. Has been having falls due to forgetting to utilize RW when getting up. Currently pt requires +2 physical assist for bed mobility, and transfers with RW. Recommend staff use Stedy for OOB transfers at this time Investment banker, corporate notified). Limited WB tolerance on LLE and fatigues quickly requiring Max A +2 for pivot transfer from bed to recliner. Daughter in room and supportive, agreeable to SNF. Patient will benefit from skilled PT to increase their independence and safety with mobility to allow discharge to the venue listed below.          Recommendations for follow up therapy are one component of a multi-disciplinary discharge planning process, led by the attending physician.  Recommendations may be updated based on patient status, additional functional criteria and insurance authorization.  Follow Up Recommendations Skilled nursing-short term rehab (<3 hours/day) Can patient physically be transported by private vehicle: No    Assistance Recommended at Discharge Frequent or constant Supervision/Assistance  Patient can return home with the following  Two people to help with walking and/or transfers;Two people to help with bathing/dressing/bathroom;Assistance with cooking/housework;Direct supervision/assist for medications management;Direct supervision/assist for  financial management;Assist for transportation    Equipment Recommendations None recommended by PT  Recommendations for Other Services       Functional Status Assessment Patient has had a recent decline in their functional status and demonstrates the ability to make significant improvements in function in a reasonable and predictable amount of time.     Precautions / Restrictions Precautions Precautions: Fall Restrictions Weight Bearing Restrictions: Yes LLE Weight Bearing: Weight bearing as tolerated (per notes)      Mobility  Bed Mobility Overal bed mobility: Needs Assistance Bed Mobility: Supine to Sit     Supine to sit: +2 for physical assistance, Mod assist, HOB elevated     General bed mobility comments: Mod assist for LE and trunk to rise to EOB with cues for technique throughout, pt able to pull through therapist's hand to assist.    Transfers Overall transfer level: Needs assistance Equipment used: Rolling walker (2 wheels) Transfers: Sit to/from Stand, Bed to chair/wheelchair/BSC Sit to Stand: Mod assist, +2 physical assistance, From elevated surface Stand pivot transfers: Max assist, +2 physical assistance         General transfer comment: Mod assist +2 for boost to stand, cues for technique, hand placement and assist for LE alignment once upright. Able to stand with UE support initially tolerating WB through LLE and lightly moving LLE backwards under BOS with help. Max assist to pivot to recliner with RW +2 support due to LLE becoming progressively weaker with less tolerance to stabilize despite RW and RLE use.    Ambulation/Gait               General Gait Details: unable at this time  Science writer  Modified Rankin (Stroke Patients Only)       Balance Overall balance assessment: Needs assistance Sitting-balance support: Bilateral upper extremity supported, Feet supported Sitting balance-Leahy Scale:  Poor Sitting balance - Comments: Sat EOB periodically without support. at close guard level from therapist. Postural control: Posterior lean Standing balance support: Bilateral upper extremity supported, Reliant on assistive device for balance Standing balance-Leahy Scale: Poor                               Pertinent Vitals/Pain Pain Assessment Pain Assessment: PAINAD Breathing: occasional labored breathing, short period of hyperventilation Negative Vocalization: occasional moan/groan, low speech, negative/disapproving quality Facial Expression: smiling or inexpressive Body Language: tense, distressed pacing, fidgeting Consolability: distracted or reassured by voice/touch PAINAD Score: 4 Pain Intervention(s): Monitored during session, Repositioned    Home Living Family/patient expects to be discharged to:: Skilled nursing facility Living Arrangements: Other (Comment) (Memory unit [facility]) Available Help at Discharge: Other (Comment) (From memory care) Type of Home: Assisted living (From memory care unit at [facility])           Home Equipment: Standard Walker;Wheelchair - manual Additional Comments: Daughter provided information.    Prior Function Prior Level of Function : Needs assist;History of Falls (last six months)             Mobility Comments: Uses RW for short distance ambulation however forgets due to dementia and often tries to walk without it. Also uses w/c. ADLs Comments: needs assist with all ADLs     Hand Dominance        Extremity/Trunk Assessment   Upper Extremity Assessment Upper Extremity Assessment: Defer to OT evaluation    Lower Extremity Assessment Lower Extremity Assessment: Generalized weakness;LLE deficits/detail LLE Deficits / Details: post op guarding and weakness as expected LLE: Unable to fully assess due to pain       Communication   Communication: No difficulties  Cognition Arousal/Alertness:  Awake/alert Behavior During Therapy: WFL for tasks assessed/performed Overall Cognitive Status: History of cognitive impairments - at baseline                                          General Comments      Exercises     Assessment/Plan    PT Assessment Patient needs continued PT services  PT Problem List Decreased strength;Decreased activity tolerance;Decreased range of motion;Decreased balance;Decreased mobility;Decreased cognition;Decreased knowledge of use of DME;Decreased safety awareness;Decreased knowledge of precautions;Pain       PT Treatment Interventions DME instruction    PT Goals (Current goals can be found in the Care Plan section)  Acute Rehab PT Goals Patient Stated Goal: none stated PT Goal Formulation: With patient/family Time For Goal Achievement: 04/29/22 Potential to Achieve Goals: Fair    Frequency Min 3X/week     Co-evaluation               AM-PAC PT "6 Clicks" Mobility  Outcome Measure Help needed turning from your back to your side while in a flat bed without using bedrails?: A Lot Help needed moving from lying on your back to sitting on the side of a flat bed without using bedrails?: Total Help needed moving to and from a bed to a chair (including a wheelchair)?: Total Help needed standing up from a chair using your arms (e.g., wheelchair or bedside chair)?:  Total Help needed to walk in hospital room?: Total Help needed climbing 3-5 steps with a railing? : Total 6 Click Score: 7    End of Session Equipment Utilized During Treatment: Gait belt;Oxygen Activity Tolerance: Patient limited by pain Patient left: in chair;with call bell/phone within reach;with chair alarm set;with family/visitor present;with SCD's reapplied Nurse Communication: Mobility status;Need for lift equipment (Rec Stedy for transfers) PT Visit Diagnosis: Unsteadiness on feet (R26.81);Muscle weakness (generalized) (M62.81);Repeated falls (R29.6);History  of falling (Z91.81);Difficulty in walking, not elsewhere classified (R26.2);Pain Pain - Right/Left: Left Pain - part of body: Hip    Time: [id number] PT Time Calculation (min) (ACUTE ONLY): 29 min   Charges:   PT Evaluation $PT Eval Moderate Complexity: 1 Mod PT Treatments $Therapeutic Activity: 8-22 mins        Candie Mile, PT, DPT Physical Therapist Acute Rehabilitation Services [place]   Ellouise Newer 04/15/2022, 1:52 PM

## 2022-04-16 DIAGNOSIS — S72002A Fracture of unspecified part of neck of left femur, initial encounter for closed fracture: Secondary | ICD-10-CM | POA: Diagnosis not present

## 2022-04-16 DIAGNOSIS — W19XXXA Unspecified fall, initial encounter: Secondary | ICD-10-CM | POA: Diagnosis not present

## 2022-04-16 DIAGNOSIS — I4821 Permanent atrial fibrillation: Secondary | ICD-10-CM | POA: Diagnosis not present

## 2022-04-16 DIAGNOSIS — Z7901 Long term (current) use of anticoagulants: Secondary | ICD-10-CM | POA: Diagnosis not present

## 2022-04-16 LAB — FERRITIN: Ferritin: 91 ng/mL (ref 11–307)

## 2022-04-16 LAB — RETICULOCYTES
Immature Retic Fract: 10.5 % (ref 2.3–15.9)
RBC.: 3.09 MIL/uL — ABNORMAL LOW (ref 3.87–5.11)
Retic Count, Absolute: 58.7 10*3/uL (ref 19.0–186.0)
Retic Ct Pct: 1.9 % (ref 0.4–3.1)

## 2022-04-16 LAB — RENAL FUNCTION PANEL
Albumin: 2.7 g/dL — ABNORMAL LOW (ref 3.5–5.0)
Anion gap: 5 (ref 5–15)
BUN: 24 mg/dL — ABNORMAL HIGH (ref 8–23)
CO2: 27 mmol/L (ref 22–32)
Calcium: 8.4 mg/dL — ABNORMAL LOW (ref 8.9–10.3)
Chloride: 107 mmol/L (ref 98–111)
Creatinine, Ser: 1.4 mg/dL — ABNORMAL HIGH (ref 0.44–1.00)
GFR, Estimated: 35 mL/min — ABNORMAL LOW (ref >60)
Glucose, Bld: 86 mg/dL (ref 70–99)
Phosphorus: 2.9 mg/dL (ref 2.5–4.6)
Potassium: 3.9 mmol/L (ref 3.5–5.1)
Sodium: 139 mmol/L (ref 135–145)

## 2022-04-16 LAB — IRON AND TIBC
Iron: 26 ug/dL — ABNORMAL LOW (ref 28–170)
Saturation Ratios: 10 % — ABNORMAL LOW (ref 10.4–31.8)
TIBC: 260 ug/dL (ref 250–450)
UIBC: 234 ug/dL

## 2022-04-16 LAB — CBC
HCT: 30.3 % — ABNORMAL LOW (ref 36.0–46.0)
Hemoglobin: 9.4 g/dL — ABNORMAL LOW (ref 12.0–15.0)
MCH: 29.6 pg (ref 26.0–34.0)
MCHC: 31 g/dL (ref 30.0–36.0)
MCV: 95.3 fL (ref 80.0–100.0)
Platelets: 225 10*3/uL (ref 150–400)
RBC: 3.18 MIL/uL — ABNORMAL LOW (ref 3.87–5.11)
RDW: 15.6 % — ABNORMAL HIGH (ref 11.5–15.5)
WBC: 5.7 10*3/uL (ref 4.0–10.5)
nRBC: 0 % (ref 0.0–0.2)

## 2022-04-16 LAB — FOLATE: Folate: 4.2 ng/mL — ABNORMAL LOW (ref >5.9)

## 2022-04-16 LAB — MAGNESIUM: Magnesium: 2.3 mg/dL (ref 1.7–2.4)

## 2022-04-16 LAB — VITAMIN B12: Vitamin B-12: 1106 pg/mL — ABNORMAL HIGH (ref 180–914)

## 2022-04-16 MED ORDER — FOLIC ACID 1 MG PO TABS
1.0000 mg | ORAL_TABLET | Freq: Every day | ORAL | Status: DC
  Administered 2022-04-16 – 2022-04-18 (×3): 1 mg via ORAL
  Filled 2022-04-16 (×3): qty 1

## 2022-04-16 NOTE — NC FL2 (Signed)
[place] LEVEL OF CARE FORM     IDENTIFICATION  Patient Name: Kathy Massey Birthdate: 09/01/1927 Sex: female Admission Date (Current Location): 04/14/2022  [facility] and [place] Number:  Herbalist and Address:  [facility], [place] [address] [place], [place] [address]      Provider Number: [id number]  Attending Physician Name and Address:  Mercy Riding, MD  Relative Name and Phone Number:  Rosemary Holms [phone]    Current Level of Care: Hospital Recommended Level of Care: [place] Prior Approval Number:    Date Approved/Denied:   PASRR Number: [id number]  Discharge Plan: SNF    Current Diagnoses: Patient Active Problem List   Diagnosis Date Noted   Chronic kidney disease, stage 3b ([place]) 04/15/2022   Closed left hip fracture ([place]) 04/14/2022   Elevated alkaline phosphatase level 04/14/2022   Falls, initial encounter 04/14/2022   Protein-calorie malnutrition, severe 02/22/2021   CHF (congestive heart failure) ([place])    Fracture, intertrochanteric, right femur, closed, initial encounter ([place])    Hip fracture ([place]) 02/13/2021   DNR (do not resuscitate) 02/13/2021   Hyperglycemia 02/13/2021   Blood clotting disorder ([place]) 12/14/2020   Mixed Alzheimer's and vascular dementia ([place]) 04/13/2020   Alzheimer's dementia with behavioral disturbance ([place]) 10/13/2019   Open wound of scalp 03/01/2018   Anticoagulant long-term use 07/20/2017   Stage 3a chronic kidney disease (CKD) ([place]) 07/20/2017   Dizziness 07/20/2017   Other thrombophilia ([place]) 07/04/2017   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 03/28/2017   Acute gout of right ankle 09/09/2014   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 ([place]) 12/11/2013   BCC (basal cell carcinoma) 10/22/2012   Diverticulosis 10/22/2012   Iron deficiency anemia 10/22/2012   Atrial fibrillation ([place]) 10/22/2012    Orientation RESPIRATION BLADDER Height &  Weight     Self  Normal External catheter, Incontinent Weight: 107 lb 5.8 oz (48.7 kg) Height:  '5\' 2"'$  (157.5 cm)  BEHAVIORAL SYMPTOMS/MOOD NEUROLOGICAL BOWEL NUTRITION STATUS      Continent Diet (see discharge summary)  AMBULATORY STATUS COMMUNICATION OF NEEDS Skin   Extensive Assist Verbally Surgical wounds, Other (Comment) (left hip incision)                       Personal Care Assistance Level of Assistance  Bathing, Dressing Bathing Assistance: Maximum assistance   Dressing Assistance: Maximum assistance     Functional Limitations Info             SPECIAL CARE FACTORS FREQUENCY  PT (By licensed PT), OT (By licensed OT)     PT Frequency: 5x/wk OT Frequency: 5x/wk            Contractures      Additional Factors Info  Code Status, Allergies Code Status Info: DNR Allergies Info: Shellfish           Current Medications (04/16/2022):  This is the current hospital active medication list Current Facility-Administered Medications  Medication Dose Route Frequency Provider Last Rate Last Admin   acetaminophen (TYLENOL) tablet 650 mg  650 mg Oral Q6H WA Wendee Beavers T, MD   650 mg at 04/16/22 0939   albuterol (PROVENTIL) (2.5 MG/3ML) 0.083% nebulizer solution 2.5 mg  2.5 mg Nebulization Q6H PRN Vanetta Mulders, MD       amiodarone (PACERONE) tablet 100 mg  100 mg Oral Daily Vanetta Mulders, MD   100 mg at 04/16/22 [phone]  apixaban (ELIQUIS) tablet 2.5 mg  2.5 mg Oral BID Tamala Julian, Rondell A, MD   2.5 mg at 04/16/22 0939   busPIRone (BUSPAR) tablet 10 mg  10 mg Oral TID Vanetta Mulders, MD   10 mg at 04/16/22 0939   escitalopram (LEXAPRO) tablet 10 mg  10 mg Oral q morning Vanetta Mulders, MD   10 mg at 04/16/22 0939   feeding supplement (ENSURE ENLIVE / ENSURE PLUS) liquid 237 mL  237 mL Oral BID BM Vanetta Mulders, MD   237 mL at 04/16/22 0941   HYDROcodone-acetaminophen (NORCO/VICODIN) 5-325 MG per tablet 1-2 tablet  1-2 tablet Oral Q6H PRN Vanetta Mulders, MD        memantine [facility]) tablet 10 mg  10 mg Oral BID Vanetta Mulders, MD   10 mg at 04/16/22 [id number]   methocarbamol (ROBAXIN) tablet 500 mg  500 mg Oral Q6H PRN Vanetta Mulders, MD       midodrine (PROAMATINE) tablet 10 mg  10 mg Oral TID WC Vanetta Mulders, MD   10 mg at 04/16/22 [id number]   morphine (PF) 2 MG/ML injection 0.5 mg  0.5 mg Intravenous Q2H PRN Vanetta Mulders, MD       multivitamin with minerals tablet 1 tablet  1 tablet Oral Daily Vanetta Mulders, MD   1 tablet at 04/16/22 0939   ondansetron (ZOFRAN) injection 4 mg  4 mg Intravenous Q6H PRN Vanetta Mulders, MD       Oral care mouth rinse  15 mL Mouth Rinse PRN Tamala Julian, Rondell A, MD       polyethylene glycol (MIRALAX / GLYCOLAX) packet 17 g  17 g Oral Daily Vanetta Mulders, MD   17 g at 04/16/22 [id number]   senna-docusate (Senokot-S) tablet 1 tablet  1 tablet Oral Daily Mercy Riding, MD   1 tablet at 04/16/22 [id number]     Discharge Medications: Please see discharge summary for a list of discharge medications.  Relevant Imaging Results:  Relevant Lab Results:   Additional Information SSN # [id number]  Ina Homes, LCSWA

## 2022-04-16 NOTE — Progress Notes (Signed)
PROGRESS NOTE  Kathy Massey [id number] DOB: Dec 01, 1927   PCP: [name], MD  [facility] is from: [name] via [facility]   DOA: 04/14/2022 LOS: 2  Chief complaints Chief Complaint  Patient presents with   level 2 - fall on thinners     Brief Narrative / Interim history: 89 year old F with PMH of Alzheimer's dementia, systolic CHF, A-fib on Eliquis, marginal zone lymphoma, CKD-3B, HTN and anxiety brought to ED after unwitnessed fall and left hip fracture, and admitted.  X-ray confirmed left hip fracture.  CT head and neck without acute finding.  Patient underwent left hip cephalomedullary nail by Dr. Sammuel Hines on 04/14/2022.  Therapy recommended SNF.   Subjective: Seen and examined earlier this morning.  No major events overnight of this morning.  No complaints but not a reliable historian.  She is only oriented to self.  Objective: Vitals:   04/16/22 0405 04/16/22 0452 04/16/22 0608 04/16/22 0821  BP: 118/71   115/71  Pulse: 74 65 72 69  Resp: 16   16  Temp: 97.7 F (36.5 C)   98.4 F (36.9 C)  TempSrc: Oral     SpO2: 99% 97% 96%   Weight:   48.7 kg   Height:        Examination:  GENERAL: Appears frail.  No apparent distress. HEENT: MMM.  Vision and hearing grossly intact.  NECK: Supple.  No apparent JVD.  RESP:  No IWOB.  Fair aeration bilaterally. CVS:  RRR. Heart sounds normal.  ABD/GI/GU: BS+. Abd soft, NTND.  MSK/EXT:   No apparent deformity. Moves extremities. No edema.  SKIN: Dressing over surgical site DCI. NEURO: Awake and alert.  Only oriented to self.  Follows some commands.  No apparent focal neuro deficit. PSYCH: Calm. Normal affect.   Procedures:  3/1-left hip cephalomedullary nail by Dr. Sammuel Hines  Microbiology summarized: MRSA PCR screen nonreactive.  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 Sammuel Hines on 3/1 -Tylenol 650 mg every 6 hours while awake.  The rest per surgery. -Already on low-dose Eliquis for A-fib which will serve VTE prophylaxis -PT/OT-she is WBAT on operative extremity-recommended SNF  Chronic systolic CHF: TTE in [id number] with LVEF of 30 to 35%, GH, severe LAE, severe RAE, moderate MVR and severe TVR.  Appears dry on exam.  On p.o. Lasix at home. -Continue holding Lasix -Discontinue IV fluid -Strict intake and output, daily weight, renal functions and electrolytes  Persistent A-fib: Rate controlled. -Continue home amiodarone and low-dose Eliquis -Optimize electrolytes -Discontinue telemetry   CKD-3B: Cr improved today. Recent Labs    06/16/21 1511 07/10/21 1019 12/16/21 1510 04/14/22 0448 04/15/22 0700 04/16/22 0801  BUN 24* '17 20 21 23 '$ 24*  CREATININE 1.53* 1.34* 1.51* 1.52* 1.78* 1.40*  -Discontinue IV fluid -Continue holding Lasix -Avoid nephrotoxic meds.  Acute postoperative blood loss anemia: EBL about 50 cc.  Drop in Hgb likely dilutional.  No evidence of bleeding. Recent Labs    06/16/21 1511 07/10/21 1019 12/16/21 1510 04/14/22 0448 04/15/22 0700  04/15/22 1638 04/16/22 0801  HGB 13.4 14.3 13.6 13.1 9.9* 10.3* 9.4*  -Discontinue IV fluid -Follow anemia panel  Mixed Alzheimer's and vascular dementia:  She is awake and alert but disoriented.  No behavioral disturbance. -Reorientation and delirium precautions -Continue Namenda, Lexapro, and BuSpar   Marginal zone lymphoma of the intra-abdominal lymph nodes: Followed by Dr. Marin Olp.  Currently not on treatment -Outpatient  follow-up.  Elevated alkaline phosphatase: Mild.   Goal of care/DNR-confirmed.  Has ACP note.  Increased nutrient needs: Body mass index is 19.64 kg/m. Nutrition Problem: Increased nutrient needs Etiology: hip fracture Signs/Symptoms: estimated needs     DVT prophylaxis:  apixaban (ELIQUIS) tablet 2.5 mg Start: 04/15/22 1000 apixaban (ELIQUIS) tablet 2.5 mg  Code Status: DNR/DNI Family Communication: None at bedside Level of care: Med-Surg Status is: Inpatient Remains inpatient appropriate because: Left hip fracture   Final disposition: Likely SNF Consultants:  Orthopedic surgery  35 minutes with more than 50% spent in reviewing records, counseling patient/family and coordinating care.   Sch Meds:  Scheduled Meds:  acetaminophen  650 mg Oral Q6H WA   amiodarone  100 mg Oral Daily   apixaban  2.5 mg Oral BID   busPIRone  10 mg Oral TID   escitalopram  10 mg Oral q morning   feeding supplement  237 mL Oral BID BM   memantine  10 mg Oral BID   midodrine  10 mg Oral TID WC   multivitamin with minerals  1 tablet Oral Daily   polyethylene glycol  17 g Oral Daily   senna-docusate  1 tablet Oral Daily   Continuous Infusions:  sodium chloride 60 mL/hr at 04/15/22 1835   PRN Meds:.albuterol, HYDROcodone-acetaminophen, methocarbamol, morphine injection, ondansetron (ZOFRAN) IV, mouth rinse  Antimicrobials: Anti-infectives (From admission, onward)    Start     Dose/Rate Route Frequency Ordered Stop   04/15/22 0600  ceFAZolin (ANCEF) IVPB 2g/100 mL premix        2 g 200 mL/hr over 30 Minutes Intravenous On call to O.R. 04/14/22 1204 04/14/22 1545   04/14/22 2200  ceFAZolin (ANCEF) IVPB 2g/100 mL premix        2 g 200 mL/hr over 30 Minutes Intravenous Every 8 hours 04/14/22 1752 04/15/22 0630        I have personally reviewed the following labs and images: CBC: Recent Labs  Lab 04/14/22 0448 04/15/22 0700 04/15/22 1638 04/16/22 0801  WBC 6.3 7.0 10.2 5.7   HGB 13.1 9.9* 10.3* 9.4*  HCT 41.4 31.0* 33.0* 30.3*  MCV 95.0 94.5 95.1 95.3  PLT 275 226 276 225   BMP &GFR Recent Labs  Lab 04/14/22 0448 04/15/22 0700 04/16/22 0801  NA 136 136 139  K 4.2 4.5 3.9  CL 99 103 107  CO2 '22 23 27  '$ GLUCOSE 117* 136* 86  BUN 21 23 24*  CREATININE 1.52* 1.78* 1.40*  CALCIUM 9.1 8.6* 8.4*  MG  --  2.3  --   PHOS  --   --  2.9   Estimated Creatinine Clearance: 19.7 mL/min (A) (by C-G formula based on SCr of 1.4 mg/dL (H)). Liver & Pancreas: Recent Labs  Lab 04/14/22 0448 04/16/22 0801  AST 25  --   ALT 9  --   ALKPHOS 141*  --   BILITOT 0.8  --   PROT 6.7  --   ALBUMIN 3.5 2.7*   No results for input(s): "LIPASE", "AMYLASE" in the last 168 hours. No results for input(s): "AMMONIA" in  the last 168 hours. Diabetic: No results for input(s): "HGBA1C" in the last 72 hours. No results for input(s): "GLUCAP" in the last 168 hours. Cardiac Enzymes: No results for input(s): "CKTOTAL", "CKMB", "CKMBINDEX", "TROPONINI" in the last 168 hours. No results for input(s): "PROBNP" in the last 8760 hours. Coagulation Profile: Recent Labs  Lab 04/14/22 0457  INR 1.1   Thyroid Function Tests: No results for input(s): "TSH", "T4TOTAL", "FREET4", "T3FREE", "THYROIDAB" in the last 72 hours. Lipid Profile: No results for input(s): "CHOL", "HDL", "LDLCALC", "TRIG", "CHOLHDL", "LDLDIRECT" in the last 72 hours. Anemia Panel: Recent Labs    04/16/22 0800  RETICCTPCT 1.9   Urine analysis:    Component Value Date/Time   COLORURINE YELLOW 10/22/2019 1537   APPEARANCEUR CLEAR 10/22/2019 1537   LABSPEC 1.025 10/22/2019 1537   PHURINE 5.5 10/22/2019 1537   GLUCOSEU NEGATIVE 10/22/2019 1537   HGBUR TRACE (A) 10/22/2019 1537   BILIRUBINUR NEGATIVE 10/22/2019 1537   KETONESUR NEGATIVE 10/22/2019 1537   PROTEINUR NEGATIVE 10/22/2019 1537   NITRITE NEGATIVE 10/22/2019 1537   LEUKOCYTESUR SMALL (A) 10/22/2019 1537   Sepsis Labs: Invalid input(s):  "PROCALCITONIN", "LACTICIDVEN"  Microbiology: Recent Results (from the past 240 hour(s))  MRSA Next Gen by PCR, Nasal     Status: None   Collection Time: 04/14/22  1:17 PM   Specimen: Nasal Mucosa; Nasal Swab  Result Value Ref Range Status   MRSA by PCR Next Gen NOT DETECTED NOT DETECTED Final    Comment: (NOTE) The GeneXpert MRSA Assay (FDA approved for NASAL specimens only), is one component of a comprehensive MRSA colonization surveillance program. It is not intended to diagnose MRSA infection nor to guide or monitor treatment for MRSA infections. Test performance is not FDA approved in patients less than 45 years old. Performed at [facility], [place] [address], [place], [place] [address]     Radiology Studies: No results found.    Malarie Tappen T. Gobles  If 7PM-7AM, please contact night-coverage [email or web address] 04/16/2022, 10:11 AM

## 2022-04-16 NOTE — TOC Initial Note (Addendum)
Transition of Care [facility]) - Initial/Assessment Note    Patient Details  Name: Kathy Massey MRN: [id number] Date of Birth: Jun 21, 1927  Transition of Care [facility]) CM/SW Contact:    Ina Homes, [place] Phone Number: 04/16/2022, 10:05 AM  Clinical Narrative:                  SW spoke with pt's daughter [name] ([phone]) confirmed pt was in [place] at [facility], reports she has already told them, pt will likely need SNF. [name] reports pt needs assistance with dressing and bathing since breaking arm last year, otherwise uses w/c or r/w at facility. Pt will need PTAR at d/c. SW explained need for insurance auth, [name] verbalized understanding.   SW left VM with [facility] [phone]) regarding need for SNF.  Update 1014am SW received callback from [facility]) should have a bed for pt on Tuesday   Expected Discharge Plan: [place] Barriers to Discharge: Insurance Authorization   Patient Goals and CMS Choice   CMS Medicare.gov Compare Post Acute Care list provided to:: Patient Represenative (must comment) (daughter [name]) Choice offered to / list presented to : Adult Children      Expected Discharge Plan and Services       Living arrangements for the past 2 months:  (Memory Care)                                      Prior Living Arrangements/Services Living arrangements for the past 2 months:  (Memory Care) Lives with:: Facility Resident   Do you feel safe going back to the place where you live?: Yes      Need for Family Participation in Patient Care: Yes (Comment) Care giver support system in place?: Yes (comment) Current home services: DME Criminal Activity/Legal Involvement Pertinent to Current Situation/Hospitalization: No - Comment as needed  Activities of Daily Living Home Assistive Devices/Equipment: None ADL Screening (condition at time of admission) Patient's cognitive ability adequate to safely complete daily  activities?: No Is the patient deaf or have difficulty hearing?: Yes Does the patient have difficulty seeing, even when wearing glasses/contacts?: No Does the patient have difficulty concentrating, remembering, or making decisions?: Yes Patient able to express need for assistance with ADLs?: Yes Does the patient have difficulty dressing or bathing?: Yes Independently performs ADLs?: No Communication: Independent Dressing (OT): Needs assistance Is this a change from baseline?: Pre-admission baseline Grooming: Dependent Is this a change from baseline?: Pre-admission baseline Bathing: Dependent Is this a change from baseline?: Pre-admission baseline Toileting: Needs assistance Is this a change from baseline?: Pre-admission baseline In/Out Bed: Needs assistance Is this a change from baseline?: Pre-admission baseline Walks in Home: Needs assistance Is this a change from baseline?: Pre-admission baseline Does the patient have difficulty walking or climbing stairs?: Yes Weakness of Legs: Both Weakness of Arms/Hands: Both  Permission Sought/Granted   Permission granted to share information with : Yes, Verbal Permission Granted     Permission granted to share info w AGENCY: SNF        Emotional Assessment       Orientation: : Oriented to Self   Psych Involvement: No (comment)  Admission diagnosis:  Closed left hip fracture ([place]) [S72.002A] Closed fracture of left hip, initial encounter [facility]) [S72.002A] Patient Active Problem List   Diagnosis Date Noted   Chronic kidney disease, stage 3b ([place]) 04/15/2022   Closed left hip fracture (  [place]) 04/14/2022   Elevated alkaline phosphatase level 04/14/2022   Falls, initial encounter 04/14/2022   Protein-calorie malnutrition, severe 02/22/2021   CHF (congestive heart failure) ([place])    Fracture, intertrochanteric, right femur, closed, initial encounter ([place])    Hip fracture ([place]) 02/13/2021   DNR (do not resuscitate) 02/13/2021    Hyperglycemia 02/13/2021   Blood clotting disorder ([place]) 12/14/2020   Mixed Alzheimer's and vascular dementia ([place]) 04/13/2020   Alzheimer's dementia with behavioral disturbance ([place]) 10/13/2019   Open wound of scalp 03/01/2018   Anticoagulant long-term use 07/20/2017   Stage 3a chronic kidney disease (CKD) ([place]) 07/20/2017   Dizziness 07/20/2017   Other thrombophilia ([place]) 07/04/2017   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 03/28/2017   Acute gout of right ankle 09/09/2014   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 ([place]) 12/11/2013   BCC (basal cell carcinoma) 10/22/2012   Diverticulosis 10/22/2012   Iron deficiency anemia 10/22/2012   Atrial fibrillation ([place]) 10/22/2012   PCP:  [name], MD Pharmacy:  No Pharmacies Listed    Social Determinants of Health (SDOH) Social History: SDOH Screenings   Food Insecurity: No Food Insecurity (04/14/2022)  Housing: Low Risk  (04/14/2022)  Transportation Needs: No Transportation Needs (04/14/2022)  Utilities: Not At Risk (04/14/2022)  Tobacco Use: Low Risk  (04/14/2022)   SDOH Interventions:     Readmission Risk Interventions     No data to display

## 2022-04-16 NOTE — Plan of Care (Signed)
Patient's daughter and son updated over the phone

## 2022-04-16 NOTE — Plan of Care (Signed)
  Problem: Clinical Measurements: Goal: Diagnostic test results will improve Outcome: Progressing   Problem: Clinical Measurements: Goal: Respiratory complications will improve Outcome: Progressing   Problem: Clinical Measurements: Goal: Cardiovascular complication will be avoided Outcome: Progressing   Problem: Nutrition: Goal: Adequate nutrition will be maintained Outcome: Progressing   Problem: Elimination: Goal: Will not experience complications related to bowel motility Outcome: Progressing   Problem: Elimination: Goal: Will not experience complications related to urinary retention Outcome: Progressing

## 2022-04-17 DIAGNOSIS — S72002A Fracture of unspecified part of neck of left femur, initial encounter for closed fracture: Secondary | ICD-10-CM | POA: Diagnosis not present

## 2022-04-17 DIAGNOSIS — Z7901 Long term (current) use of anticoagulants: Secondary | ICD-10-CM | POA: Diagnosis not present

## 2022-04-17 DIAGNOSIS — W19XXXA Unspecified fall, initial encounter: Secondary | ICD-10-CM | POA: Diagnosis not present

## 2022-04-17 DIAGNOSIS — R748 Abnormal levels of other serum enzymes: Secondary | ICD-10-CM | POA: Diagnosis not present

## 2022-04-17 LAB — RENAL FUNCTION PANEL
Albumin: 2.4 g/dL — ABNORMAL LOW (ref 3.5–5.0)
Anion gap: 8 (ref 5–15)
BUN: 25 mg/dL — ABNORMAL HIGH (ref 8–23)
CO2: 25 mmol/L (ref 22–32)
Calcium: 8.4 mg/dL — ABNORMAL LOW (ref 8.9–10.3)
Chloride: 105 mmol/L (ref 98–111)
Creatinine, Ser: 1.22 mg/dL — ABNORMAL HIGH (ref 0.44–1.00)
GFR, Estimated: 42 mL/min — ABNORMAL LOW (ref >60)
Glucose, Bld: 87 mg/dL (ref 70–99)
Phosphorus: 2.5 mg/dL (ref 2.5–4.6)
Potassium: 4.4 mmol/L (ref 3.5–5.1)
Sodium: 138 mmol/L (ref 135–145)

## 2022-04-17 LAB — CBC
HCT: 27.5 % — ABNORMAL LOW (ref 36.0–46.0)
HCT: 28.5 % — ABNORMAL LOW (ref 36.0–46.0)
Hemoglobin: 8.7 g/dL — ABNORMAL LOW (ref 12.0–15.0)
Hemoglobin: 9.2 g/dL — ABNORMAL LOW (ref 12.0–15.0)
MCH: 30.4 pg (ref 26.0–34.0)
MCH: 31 pg (ref 26.0–34.0)
MCHC: 31.6 g/dL (ref 30.0–36.0)
MCHC: 32.3 g/dL (ref 30.0–36.0)
MCV: 96.2 fL (ref 80.0–100.0)
MCV: 96 fL (ref 80.0–100.0)
Platelets: 218 10*3/uL (ref 150–400)
Platelets: 234 10*3/uL (ref 150–400)
RBC: 2.86 MIL/uL — ABNORMAL LOW (ref 3.87–5.11)
RBC: 2.97 MIL/uL — ABNORMAL LOW (ref 3.87–5.11)
RDW: 15.9 % — ABNORMAL HIGH (ref 11.5–15.5)
RDW: 15.8 % — ABNORMAL HIGH (ref 11.5–15.5)
WBC: 6.2 10*3/uL (ref 4.0–10.5)
WBC: 6.5 10*3/uL (ref 4.0–10.5)
nRBC: 0 % (ref 0.0–0.2)
nRBC: 0 % (ref 0.0–0.2)

## 2022-04-17 LAB — MAGNESIUM: Magnesium: 2.3 mg/dL (ref 1.7–2.4)

## 2022-04-17 MED ORDER — ESCITALOPRAM OXALATE 10 MG PO TABS: 10.0000 mg | ORAL_TABLET | Freq: Every morning | ORAL | Status: DC

## 2022-04-17 MED ORDER — ADULT MULTIVITAMIN W/MINERALS CH: 1.0000 | ORAL_TABLET | Freq: Every day | ORAL | Status: DC

## 2022-04-17 MED ORDER — HYDROCODONE-ACETAMINOPHEN 5-325 MG PO TABS: 1.0000 | ORAL_TABLET | Freq: Three times a day (TID) | ORAL | 0 refills | Status: DC | PRN

## 2022-04-17 MED ORDER — TRAMADOL HCL 50 MG PO TABS: 25.0000 mg | ORAL_TABLET | Freq: Two times a day (BID) | ORAL | 0 refills | Status: DC | PRN

## 2022-04-17 MED ORDER — FOLIC ACID 1 MG PO TABS: 1.0000 mg | ORAL_TABLET | Freq: Every day | ORAL | Status: DC

## 2022-04-17 MED ORDER — POLYETHYLENE GLYCOL 3350 17 GM/SCOOP PO POWD: 17.0000 g | Freq: Two times a day (BID) | ORAL | 0 refills | Status: DC | PRN

## 2022-04-17 MED ORDER — FUROSEMIDE 20 MG PO TABS
20.0000 mg | ORAL_TABLET | Freq: Every day | ORAL | Status: DC
  Administered 2022-04-17 – 2022-04-18 (×2): 20 mg via ORAL
  Filled 2022-04-17 (×2): qty 1

## 2022-04-17 MED ORDER — FUROSEMIDE 20 MG PO TABS: 20.0000 mg | ORAL_TABLET | Freq: Every day | ORAL | Status: DC

## 2022-04-17 MED ORDER — FUROSEMIDE 40 MG PO TABS: 20.0000 mg | ORAL_TABLET | Freq: Every day | ORAL | 0 refills | Status: DC

## 2022-04-17 MED ORDER — SENNOSIDES-DOCUSATE SODIUM 8.6-50 MG PO TABS: 1.0000 | ORAL_TABLET | Freq: Every day | ORAL | Status: DC

## 2022-04-17 MED ORDER — ACETAMINOPHEN 325 MG PO TABS: ORAL_TABLET | ORAL | 0 refills | Status: AC

## 2022-04-17 MED ORDER — ENSURE ENLIVE PO LIQD: 237.0000 mL | Freq: Two times a day (BID) | ORAL | 12 refills | Status: DC

## 2022-04-17 NOTE — Progress Notes (Signed)
Ok per [facility] on call- to leave iv out at this time

## 2022-04-17 NOTE — OR Nursing (Signed)
Vendor Representative Lynelle Smoke present from case start 1557 to final closing time 1635.

## 2022-04-17 NOTE — TOC Progression Note (Signed)
Transition of Care [facility]) - Progression Note    Patient Details  Name: Kathy Massey MRN: [id number] Date of Birth: 1927-06-02  [facility]) CM/SW Contact  Joanne Chars, LCSW Phone Number: 04/17/2022, 11:00 AM  Clinical Narrative:   CSW confirmed with [facility].  No SNF bed available today but will have bed available tomorrow.  Auth submitted in [place] and approved: [id number], 3 days: 3/5-3/7.    Expected Discharge Plan: Skilled Nursing Facility Barriers to Discharge: Insurance Authorization  Expected Discharge Plan and [place] arrangements for the past 2 months:  (Memory Care) Expected Discharge Date: 04/17/22                                     Social Determinants of Health (SDOH) Interventions SDOH Screenings   Food Insecurity: No Food Insecurity (04/14/2022)  Housing: [place]  (04/14/2022)  Transportation Needs: No Transportation Needs (04/14/2022)  Utilities: Not At Risk (04/14/2022)  Tobacco Use: Low Risk  (04/14/2022)    Readmission Risk Interventions     No data to display

## 2022-04-17 NOTE — Progress Notes (Signed)
Physical Therapy Treatment Patient Details Name: Kathy Massey MRN: [id number] DOB: 05-Sep-1927 Today's Date: 04/17/2022   History of Present Illness 89 year old F admitted 3/1 from memory care at [facility] after a fall resulting in Lt hip fx, s/p left hip cephalomedullary nail by Dr. Sammuel Hines 3/1. PMH of Alzheimer's dementia, systolic CHF, A-fib on Eliquis, marginal zone lymphoma, CKD-3B, HTN and anxiety.    PT Comments    Pt was received in supine and agreeable to session with family present. Pt required up to mod A with bed mobility due to LLE pain. Pt unable to reach full stand, however pt able to participate in lateral scoot to chair with up to max A due to pain and weakness. Pt requiring dense cues throughout for safety awareness and sequence. Pt attempting to stand prior to donning gait belt, requiring cues for safety. Pt requiring max A +2 to reposition in chair due to difficulty scooting with BUE. Pt continues to benefit from PT services to progress toward functional mobility goals.    Recommendations for follow up therapy are one component of a multi-disciplinary discharge planning process, led by the attending physician.  Recommendations may be updated based on patient status, additional functional criteria and insurance authorization.  Follow Up Recommendations  Skilled nursing-short term rehab (<3 hours/day) Can patient physically be transported by private vehicle: No   Assistance Recommended at Discharge Frequent or constant Supervision/Assistance  Patient can return home with the following Two people to help with walking and/or transfers;Two people to help with bathing/dressing/bathroom;Assistance with cooking/housework;Direct supervision/assist for medications management;Direct supervision/assist for financial management;Assist for transportation   Equipment Recommendations  None recommended by PT    Recommendations for Other Services       Precautions / Restrictions  Precautions Precautions: Fall Restrictions Weight Bearing Restrictions: Yes LLE Weight Bearing: Weight bearing as tolerated     Mobility  Bed Mobility Overal bed mobility: Needs Assistance Bed Mobility: Supine to Sit     Supine to sit: HOB elevated, Mod assist     General bed mobility comments: Mod A for LLE management and trunk elevation. Cues for sequence    Transfers Overall transfer level: Needs assistance   Transfers: Bed to chair/wheelchair/BSC            Lateral/Scoot Transfers: Max assist General transfer comment: Pt unable to reach upright stance to complete stand pivot to chair. Pt was able to perform lateral scoot with dense cues for sequence and technique and max A with bed pad.    Ambulation/Gait               General Gait Details: unable at this time      Balance Overall balance assessment: Needs assistance Sitting-balance support: Bilateral upper extremity supported, Feet supported Sitting balance-Leahy Scale: Poor Sitting balance - Comments: sitting EOB                                    Cognition Arousal/Alertness: Awake/alert Behavior During Therapy: WFL for tasks assessed/performed Overall Cognitive Status: History of cognitive impairments - at baseline                                 General Comments: Pt with decreased safety awareness and attempting to stand once sitting EOB requiring dense cues to wait for assistance and to don gait belt for safety.  Exercises      General Comments        Pertinent Vitals/Pain Pain Assessment Pain Assessment: Faces Faces Pain Scale: Hurts even more Pain Location: L hip Pain Descriptors / Indicators: Grimacing, Guarding Pain Intervention(s): Limited activity within patient's tolerance, Monitored during session, Repositioned     PT Goals (current goals can now be found in the care plan section) Acute Rehab PT Goals Patient Stated Goal: none stated PT  Goal Formulation: With patient/family Time For Goal Achievement: 04/29/22 Potential to Achieve Goals: Fair Progress towards PT goals: Progressing toward goals    Frequency    Min 3X/week      PT Plan Current plan remains appropriate       AM-PAC PT "6 Clicks" Mobility   Outcome Measure  Help needed turning from your back to your side while in a flat bed without using bedrails?: A Lot Help needed moving from lying on your back to sitting on the side of a flat bed without using bedrails?: A Lot Help needed moving to and from a bed to a chair (including a wheelchair)?: A Lot Help needed standing up from a chair using your arms (e.g., wheelchair or bedside chair)?: Total Help needed to walk in hospital room?: Total Help needed climbing 3-5 steps with a railing? : Total 6 Click Score: 9    End of Session Equipment Utilized During Treatment: Gait belt Activity Tolerance: Patient limited by pain;Patient limited by fatigue Patient left: in chair;with call bell/phone within reach;with family/visitor present Nurse Communication: Mobility status PT Visit Diagnosis: Unsteadiness on feet (R26.81);Muscle weakness (generalized) (M62.81);Repeated falls (R29.6);History of falling (Z91.81);Difficulty in walking, not elsewhere classified (R26.2);Pain     Time: [id number] PT Time Calculation (min) (ACUTE ONLY): 28 min  Charges:  $Therapeutic Activity: 23-37 mins                     Michelle Nasuti, PTA Acute Rehabilitation Services Secure Chat Preferred  [phone]    Michelle Nasuti 04/17/2022, 4:05 PM

## 2022-04-17 NOTE — TOC CAGE-AID Note (Signed)
Transition of Care [facility]) - CAGE-AID Screening   Patient Details  Name: Kathy Massey MRN: [id number] Date of Birth: March 14, 1927  [facility]) CM/SW Contact:    Clovis Cao, RN Phone Number: [phone] 04/17/2022, 9:25 AM   Clinical Narrative: Pt here after sustaining a left hip fracture due to a fall.  Pt admits to drinking approx 1 glass of wine per week but does not drink excessively and does not do drugs.  No resources needed.  Screening complete.   CAGE-AID Screening:    Have You Ever Felt You Ought to Cut Down on Your Drinking or Drug Use?: No Have People Annoyed You By Critizing Your Drinking Or Drug Use?: No Have You Felt Bad Or Guilty About Your Drinking Or Drug Use?: No Have You Ever Had a Drink or Used Drugs First Thing In The Morning to Steady Your Nerves or to Get Rid of a Hangover?: No CAGE-AID Score: 0  Substance Abuse Education Offered: No

## 2022-04-17 NOTE — Progress Notes (Signed)
   Subjective:  Patient reports pain well controlled. Difficulty with remembering she had surgery, tearful today. HG stabilized. Worked with PT  Objective:   VITALS:   Vitals:   04/16/22 0608 04/16/22 0821 04/16/22 1947 04/17/22 0851  BP:  115/71 138/82 107/64  Pulse: 72 69 94 88  Resp:  '16 18 18  '$ Temp:  98.4 F (36.9 C) 97.6 F (36.4 C) 97.7 F (36.5 C)  TempSrc:   Oral Oral  SpO2: 96%  96% 100%  Weight: 48.7 kg     Height:        Dressing CDI, SILT throughout, leg lengths equal  Lab Results  Component Value Date   WBC 6.5 04/17/2022   HGB 9.2 (L) 04/17/2022   HCT 28.5 (L) 04/17/2022   MCV 96.0 04/17/2022   PLT 234 04/17/2022     Assessment/Plan:  3 Days Post-Op status post left hip nailing, doing well  - Expected postop acute blood loss anemia - stable - Patient to work with PT to optimize mobilization safely - DVT ppx - SCDs, ambulation, Eliquis - WBAT operative extremity - Pain control - multimodal pain management, ATC acetaminophen in conjunction with as needed narcotic (oxycodone), although this should be minimized with other modalities  - Discharge planning pending CM, appreciate coordination    Kathy Massey 04/17/2022, 1:25 PM

## 2022-04-17 NOTE — Discharge Summary (Signed)
Physician Discharge Summary  Kathy Massey [id number] DOB: December 08, 1927 DOA: 04/14/2022  PCP: [name], MD  Admit date: 04/14/2022 Discharge date: 04/17/2022 Admitted From: Memory care Disposition: SNF Recommendations for Outpatient Follow-up:  Follow up with orthopedic surgery as below Check CBC and BMP in 1 week Please follow up on the following pending results: None   Discharge Condition: Stable but guarded prognosis CODE STATUS: DNR/DNI  Contact information for follow-up providers     Vanetta Mulders, MD Follow up.   Specialty: Orthopedic Surgery Contact information: [address] [place] [address] [phone]              Contact information for after-discharge care     Destination     [facility] .   Service: Skilled Nursing Contact information: [address] [place] [facility] course 89 year old F with PMH of Alzheimer's dementia, systolic CHF, A-fib on Eliquis, marginal zone lymphoma, CKD-3B, HTN and anxiety brought to ED after unwitnessed fall and left hip fracture, and admitted.  X-ray confirmed left hip fracture.  CT head and neck without acute finding.   Patient underwent left hip cephalomedullary nail by Dr. Sammuel Hines on 04/14/2022.  Patient had postoperative blood loss anemia with hemoglobin dropping from 13.1 to 9.9 on postop day 1.  She also had IV fluid that could have caused hemodilution.  Anemia panel with mild iron and folic acid deficiency.  H&H stable after initial drop.  Discharged on folic acid and multivitamin.  AKI resolved.  Lasix reduced to 20 mg daily due to risk of dehydration and AKI.  See individual problem list below for more.   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 Sammuel Hines on 3/1 -Tylenol 650 mg every 6 hours while awake.  Tramadol and oxycodone for moderate and severe pain -Already on low-dose Eliquis for A-fib which will serve VTE prophylaxis -PT/OT-she is WBAT on operative extremity-recommended SNF   Chronic systolic CHF: TTE in [id number] with LVEF of 30 to 35%, GH, severe LAE, severe RAE, moderate MVR and severe TVR.  Appears dry on exam.  On p.o. Lasix 40 mg daily at home. -Resumed home Lasix at 20 mg daily -Strict intake and output, daily weight, renal functions and electrolytes   Persistent A-fib: Rate controlled. -Continue home amiodarone and low-dose Eliquis   AKI on CKD-3B: Cr improved today. Recent Labs    06/16/21 1511 07/10/21 1019 12/16/21 1510 04/14/22 0448 04/15/22 0700 04/16/22 0801 04/17/22 0421  BUN 24* '17 20 21 23 '$ 24* 25*  CREATININE 1.53* 1.34* 1.51* 1.52* 1.78* 1.40* 1.22*  -Recheck renal function in 1 week.  Acute postoperative blood loss anemia: Hgb dropped from 13.1-9.9 on POD-1 although reported EBL is 50 cc.  She has been on IV fluids for AKI which might have caused hemodilution as well.  No signs of bleeding at surgical site.  Anemia panel with mild iron and folic acid deficiency. Recent Labs    06/16/21 1511 07/10/21 1019 12/16/21 1510 04/14/22 0448 04/15/22 0700 04/15/22 1638 04/16/22 0801 04/17/22 0421 04/17/22 1032  HGB 13.4 14.3 13.6 13.1 9.9* 10.3* 9.4* 8.7* 9.2*  -Continue multivitamin and folic acid -Recheck CBC in 1 week   Mixed Alzheimer's and vascular dementia:  She is awake and alert but disoriented.  No behavioral  disturbance. -Reorientation and delirium precautions -Continue home Namenda, Lexapro and BuSpar.  Pharmacy verified that patient is on Lexapro 10 mg daily based on recent fill   Marginal zone lymphoma of the intra-abdominal lymph nodes: Followed by Dr. Marin Olp.  Currently not on treatment -Outpatient follow-up.   Elevated alkaline phosphatase: Mild.   Goal of care/DNR-confirmed.   Nutrition Problem: Increased nutrient needs Etiology: hip fracture Signs/Symptoms: estimated needs       Vital signs Vitals:   04/16/22 0608 04/16/22 0821 04/16/22 1947 04/17/22 0851  BP:  115/71 138/82 107/64  Pulse: 72 69 94 88  Temp:  98.4 F (36.9 C) 97.6 F (36.4 C) 97.7 F (36.5 C)  Resp:  '16 18 18  '$ Height:      Weight: 48.7 kg     SpO2: 96%  96% 100%  TempSrc:   Oral Oral  BMI (Calculated): 19.63        Discharge exam  GENERAL: Appears frail.  No apparent distress. HEENT: MMM.  Vision and hearing grossly intact.  NECK: Supple.  No apparent JVD.  RESP:  No IWOB.  Fair aeration bilaterally. CVS:  RRR. Heart sounds normal.  ABD/GI/GU: BS+. Abd soft, NTND.  MSK/EXT:  Moves extremities. No apparent deformity. No edema.  SKIN: Dressing over surgical site DCI. NEURO: Awake and alert. Oriented only to self.  Follows commands.  No apparent focal neuro deficit. PSYCH: Calm. Normal affect.   Discharge Instructions Discharge Instructions     Diet general   Complete by: As directed    Increase activity slowly   Complete by: As directed       Allergies as of 04/17/2022       Reactions   Shellfish Allergy Itching, Swelling   Shrimp        Medication List     STOP taking these medications    cyanocobalamin 1000 MCG tablet   methocarbamol 500 MG tablet Commonly known as: ROBAXIN       TAKE these medications    acetaminophen 325 MG tablet Commonly known as: TYLENOL Take 2 tablets (650 mg total) by mouth every 6 (six) hours for 5 days, THEN 2 tablets (650 mg total) every  6 (six) hours as needed for up to 25 days. Start taking on: April 17, 2022 What changed: See the new instructions.   amiodarone 200 MG tablet Commonly known as: PACERONE Take 0.5 tablets (100 mg total) by mouth daily.   busPIRone 10 MG tablet Commonly known as: BUSPAR Take 10 mg by mouth 3 (three) times daily. What changed: Another medication with the same name was removed. Continue taking this medication, and follow the directions you see here.   docusate sodium 100 MG capsule Commonly known as: COLACE Take 1 capsule (100 mg total) by mouth 2 (two) times daily.   Eliquis 2.5 MG Tabs tablet Generic drug: apixaban TAKE 1 TABLET BY MOUTH TWICE A DAY What changed: how much to take   escitalopram 10 MG tablet Commonly known as: LEXAPRO Take 1 tablet (10 mg total) by mouth every morning. What changed:  medication strength how much to take   feeding supplement Liqd Take 237 mLs by mouth 2 (two) times daily between meals.   folic acid 1 MG tablet Commonly known as: FOLVITE Take 1 tablet (1 mg total) by mouth daily.   furosemide 20 MG tablet Commonly known as: LASIX Take 1 tablet (20 mg total) by mouth daily. What changed:  medication strength how much to take   HYDROcodone-acetaminophen 5-325 MG tablet Commonly known as: NORCO/VICODIN Take 1 tablet by mouth every 8 (eight) hours as needed for severe pain.   memantine 10 MG tablet Commonly known as: NAMENDA Take 10 mg by mouth 2 (two) times daily. For memory   midodrine 10 MG tablet Commonly known as: PROAMATINE Take 1 tablet (10 mg total) by mouth 3 (three) times daily with meals.   multivitamin with minerals Tabs tablet Take 1 tablet by mouth daily.   NON FORMULARY Fluid restriction of 1200 mL   polyethylene glycol 17 g packet Commonly known as: MIRALAX / GLYCOLAX Take 17 g by mouth daily. What changed: Another medication with the same name was added. Make sure you understand how and when to take each.    polyethylene glycol powder 17 GM/SCOOP powder Commonly known as: MiraLax Take 17 g by mouth 2 (two) times daily as needed for moderate constipation. What changed: You were already taking a medication with the same name, and this prescription was added. Make sure you understand how and when to take each.   senna-docusate 8.6-50 MG tablet Commonly known as: Senokot-S Take 1 tablet by mouth daily.   traMADol 50 MG tablet Commonly known as: ULTRAM Take 0.5 tablets (25 mg total) by mouth 2 (two) times daily as needed for up to 14 days for moderate pain.        Consultations: Orthopedic surgery  Procedures/Studies: 3/1-left hip cephalomedullary nail by Dr. Kathlynn Grate FEMUR MIN 2 VIEWS LEFT  Result Date: 04/14/2022 CLINICAL DATA:  Fluoroscopic assistance for internal fixation of left femur EXAM: LEFT FEMUR 2 VIEWS COMPARISON:  Radiographs done earlier today FINDINGS: There is interval reduction and internal fixation of comminuted intertrochanteric fracture of left femur with intramedullary rod. Fluoroscopic time 180.6 seconds. Radiation dose 10.65 mGy. IMPRESSION: Fluoroscopic assistance was provided for reduction and internal fixation of intertrochanteric fracture of left femur. Electronically Signed   By: Elmer Picker M.D.   On: 04/14/2022 17:46   DG C-Arm 1-60 Min-No Report  Result Date: 04/14/2022 Fluoroscopy was utilized by the requesting physician.  No radiographic interpretation.   CT HEAD WO CONTRAST  Result Date: 04/14/2022 CLINICAL DATA:  Hip fracture EXAM: CT HEAD WITHOUT CONTRAST CT CERVICAL SPINE WITHOUT CONTRAST TECHNIQUE: Multidetector CT imaging of the head and cervical spine was performed following the standard protocol without intravenous contrast. Multiplanar CT image reconstructions of the cervical spine were also generated. RADIATION DOSE REDUCTION: This exam was performed according to the departmental dose-optimization program which includes automated  exposure control, adjustment of the mA and/or kV according to patient size and/or use of iterative reconstruction technique. COMPARISON:  01/17/2022 head CT FINDINGS: CT HEAD FINDINGS Brain: No evidence of acute infarction, hemorrhage, hydrocephalus, extra-axial collection or mass lesion/mass effect. Pronounced brain atrophy. Confluent chronic small vessel ischemia in the cerebral white matter. Small chronic left cerebellar infarction. Vascular: No hyperdense vessel or unexpected calcification. Skull: Normal. Negative for fracture or focal lesion. Bilateral TMJ degenerative arthropathy. Sinuses/Orbits: No evidence of injury CT CERVICAL SPINE FINDINGS Alignment: No traumatic malalignment. Mild degenerative anterolisthesis  C2-3. Skull base and vertebrae: No acute fracture. No primary bone lesion or focal pathologic process. Generalized osteopenia Soft tissues and spinal canal: No prevertebral fluid or swelling. No visible canal hematoma. Disc levels: Ordinary and generalized cervical spine degeneration. Notable foraminal narrowing at C2-3 and C3-4. Upper chest: Clear apical lungs IMPRESSION: 1. No evidence of acute intracranial or cervical spine injury. 2. Advanced brain atrophy and chronic small vessel disease. Electronically Signed   By: Jorje Guild M.D.   On: 04/14/2022 05:09   CT CERVICAL SPINE WO CONTRAST  Result Date: 04/14/2022 CLINICAL DATA:  Hip fracture EXAM: CT HEAD WITHOUT CONTRAST CT CERVICAL SPINE WITHOUT CONTRAST TECHNIQUE: Multidetector CT imaging of the head and cervical spine was performed following the standard protocol without intravenous contrast. Multiplanar CT image reconstructions of the cervical spine were also generated. RADIATION DOSE REDUCTION: This exam was performed according to the departmental dose-optimization program which includes automated exposure control, adjustment of the mA and/or kV according to patient size and/or use of iterative reconstruction technique. COMPARISON:   01/17/2022 head CT FINDINGS: CT HEAD FINDINGS Brain: No evidence of acute infarction, hemorrhage, hydrocephalus, extra-axial collection or mass lesion/mass effect. Pronounced brain atrophy. Confluent chronic small vessel ischemia in the cerebral white matter. Small chronic left cerebellar infarction. Vascular: No hyperdense vessel or unexpected calcification. Skull: Normal. Negative for fracture or focal lesion. Bilateral TMJ degenerative arthropathy. Sinuses/Orbits: No evidence of injury CT CERVICAL SPINE FINDINGS Alignment: No traumatic malalignment. Mild degenerative anterolisthesis C2-3. Skull base and vertebrae: No acute fracture. No primary bone lesion or focal pathologic process. Generalized osteopenia Soft tissues and spinal canal: No prevertebral fluid or swelling. No visible canal hematoma. Disc levels: Ordinary and generalized cervical spine degeneration. Notable foraminal narrowing at C2-3 and C3-4. Upper chest: Clear apical lungs IMPRESSION: 1. No evidence of acute intracranial or cervical spine injury. 2. Advanced brain atrophy and chronic small vessel disease. Electronically Signed   By: Jorje Guild M.D.   On: 04/14/2022 05:09   DG Chest Port 1 View  Result Date: 04/14/2022 CLINICAL DATA:  Level 2 trauma.  Fall on blood thinners EXAM: PORTABLE CHEST 1 VIEW COMPARISON:  02/13/2021 FINDINGS: Cardiomegaly.  Stable mediastinal contours. There is no edema, consolidation, effusion, or pneumothorax. Healed left clavicle fracture. IMPRESSION: No active disease. Electronically Signed   By: Jorje Guild M.D.   On: 04/14/2022 04:49   DG Hip Unilat W or Wo Pelvis 2-3 Views Left  Result Date: 04/14/2022 CLINICAL DATA:  Level 2 trauma EXAM: DG HIP (WITH OR WITHOUT PELVIS) 2V LEFT COMPARISON:  02/13/2021 FINDINGS: Acute intertrochanteric left femur fracture. Fracture displacement and varus angulation. The hip is located. Prior right femur fracture and repair. No evidence of pelvic ring fracture or  diastasis. Generalized osteopenia. IMPRESSION: Acute and angulated intertrochanteric left femur fracture. Electronically Signed   By: Jorje Guild M.D.   On: 04/14/2022 04:48       The results of significant diagnostics from this hospitalization (including imaging, microbiology, ancillary and laboratory) are listed below for reference.     Microbiology: Recent Results (from the past 240 hour(s))  MRSA Next Gen by PCR, Nasal     Status: None   Collection Time: 04/14/22  1:17 PM   Specimen: Nasal Mucosa; Nasal Swab  Result Value Ref Range Status   MRSA by PCR Next Gen NOT DETECTED NOT DETECTED Final    Comment: (NOTE) The GeneXpert MRSA Assay (FDA approved for NASAL specimens only), is one component of a comprehensive MRSA colonization surveillance  program. It is not intended to diagnose MRSA infection nor to guide or monitor treatment for MRSA infections. Test performance is not FDA approved in patients less than 33 years old. Performed at [facility], [place] [address] [place], [place] [address]      Labs:  CBC: Recent Labs  Lab 04/15/22 0700 04/15/22 1638 04/16/22 0801 04/17/22 0421 04/17/22 1032  WBC 7.0 10.2 5.7 6.2 6.5  HGB 9.9* 10.3* 9.4* 8.7* 9.2*  HCT 31.0* 33.0* 30.3* 27.5* 28.5*  MCV 94.5 95.1 95.3 96.2 96.0  PLT 226 276 225 218 234   BMP &GFR Recent Labs  Lab 04/14/22 0448 04/15/22 0700 04/16/22 0801 04/17/22 0421  NA 136 136 139 138  K 4.2 4.5 3.9 4.4  CL 99 103 107 105  CO2 '22 23 27 25  '$ GLUCOSE 117* 136* 86 87  BUN 21 23 24* 25*  CREATININE 1.52* 1.78* 1.40* 1.22*  CALCIUM 9.1 8.6* 8.4* 8.4*  MG  --  2.3 2.3 2.3  PHOS  --   --  2.9 2.5   Estimated Creatinine Clearance: 22.6 mL/min (A) (by C-G formula based on SCr of 1.22 mg/dL (H)). Liver & Pancreas: Recent Labs  Lab 04/14/22 0448 04/16/22 0801 04/17/22 0421  AST 25  --   --   ALT 9  --   --   ALKPHOS 141*  --   --   BILITOT 0.8  --   --   PROT 6.7  --   --   ALBUMIN 3.5 2.7*  2.4*   No results for input(s): "LIPASE", "AMYLASE" in the last 168 hours. No results for input(s): "AMMONIA" in the last 168 hours. Diabetic: No results for input(s): "HGBA1C" in the last 72 hours. No results for input(s): "GLUCAP" in the last 168 hours. Cardiac Enzymes: No results for input(s): "CKTOTAL", "CKMB", "CKMBINDEX", "TROPONINI" in the last 168 hours. No results for input(s): "PROBNP" in the last 8760 hours. Coagulation Profile: Recent Labs  Lab 04/14/22 0457  INR 1.1   Thyroid Function Tests: No results for input(s): "TSH", "T4TOTAL", "FREET4", "T3FREE", "THYROIDAB" in the last 72 hours. Lipid Profile: No results for input(s): "CHOL", "HDL", "LDLCALC", "TRIG", "CHOLHDL", "LDLDIRECT" in the last 72 hours. Anemia Panel: Recent Labs    04/16/22 0800 04/16/22 0801  VITAMINB12  --  1,106*  FOLATE  --  4.2*  FERRITIN  --  91  TIBC  --  260  IRON  --  26*  RETICCTPCT 1.9  --    Urine analysis:    Component Value Date/Time   COLORURINE YELLOW 10/22/2019 1537   APPEARANCEUR CLEAR 10/22/2019 1537   LABSPEC 1.025 10/22/2019 1537   PHURINE 5.5 10/22/2019 1537   GLUCOSEU NEGATIVE 10/22/2019 1537   HGBUR TRACE (A) 10/22/2019 1537   BILIRUBINUR NEGATIVE 10/22/2019 1537   KETONESUR NEGATIVE 10/22/2019 1537   PROTEINUR NEGATIVE 10/22/2019 1537   NITRITE NEGATIVE 10/22/2019 1537   LEUKOCYTESUR SMALL (A) 10/22/2019 1537   Sepsis Labs: Invalid input(s): "PROCALCITONIN", "LACTICIDVEN"   SIGNED:  Mercy Riding, MD  [facility] 04/17/2022, 11:20 AM

## 2022-04-17 NOTE — Care Management Important Message (Signed)
Important Message  Patient Details  Name: Kathy Massey MRN: [id number] Date of Birth: May 29, 1927   Medicare Important Message Given:  Yes     Hannah Beat 04/17/2022, 12:03 PM

## 2022-04-18 DIAGNOSIS — S72002A Fracture of unspecified part of neck of left femur, initial encounter for closed fracture: Secondary | ICD-10-CM | POA: Diagnosis not present

## 2022-04-18 DIAGNOSIS — W19XXXA Unspecified fall, initial encounter: Secondary | ICD-10-CM | POA: Diagnosis not present

## 2022-04-18 DIAGNOSIS — Z7901 Long term (current) use of anticoagulants: Secondary | ICD-10-CM | POA: Diagnosis not present

## 2022-04-18 DIAGNOSIS — R748 Abnormal levels of other serum enzymes: Secondary | ICD-10-CM | POA: Diagnosis not present

## 2022-04-18 MED ORDER — TRAMADOL HCL 50 MG PO TABS: 25.0000 mg | ORAL_TABLET | Freq: Two times a day (BID) | ORAL | 0 refills | Status: AC | PRN

## 2022-04-18 NOTE — TOC Transition Note (Signed)
Transition of Care [facility]) - CM/SW Discharge Note   Patient Details  Name: Kathy Massey MRN: [id number] Date of Birth: 07-[name]-1928  Transition of Care [facility] CM/SW Contact:  Joanne Chars, LCSW Phone Number: 04/18/2022, 10:04 AM   Clinical Narrative:  Pt discharging to [place], room [facility].  RN call report to [phone].    0945: TC with [facility].  Auth info provided, confirmed that they are ready to receive pt.    Final next level of care: Skilled Nursing Facility Barriers to Discharge: Barriers Resolved   Patient Goals and CMS Choice CMS Medicare.gov Compare Post Acute Care list provided to:: Patient Represenative (must comment) (daughter [name]) Choice offered to / list presented to : Adult Children  Discharge Placement                Patient chooses bed at:  [facility]) Patient to be transferred to facility by: [facility] Name of family member notified: son Juanda Crumble in room Patient and family notified of of transfer: 04/18/22  Discharge Plan and Services Additional resources added to the After Visit Summary for                                       Social Determinants of Health (SDOH) Interventions SDOH Screenings   Food Insecurity: No Food Insecurity (04/14/2022)  Housing: [place]  (04/14/2022)  Transportation Needs: No Transportation Needs (04/14/2022)  Utilities: Not At Risk (04/14/2022)  Tobacco Use: Low Risk  (04/14/2022)     Readmission Risk Interventions     No data to display

## 2022-04-18 NOTE — Discharge Summary (Signed)
Physician Discharge Summary  Kathy Massey [id number] DOB: 06/18/1927 DOA: 04/14/2022  PCP: [name], MD  Admit date: 04/14/2022 Discharge date: 04/18/2022 Admitted From: Memory care Disposition: SNF Recommendations for Outpatient Follow-up:  Follow up with orthopedic surgery as below Check CBC and BMP in 1 week Please follow up on the following pending results: None   Discharge Condition: Stable but guarded prognosis CODE STATUS: DNR/DNI  Contact information for follow-up providers     Vanetta Mulders, MD Follow up.   Specialty: Orthopedic Surgery Contact information: [address] [place] [address] [phone]              Contact information for after-discharge care     Destination     [facility] .   Service: Skilled Nursing Contact information: [address] [place] [facility] course 89 year old F with PMH of Alzheimer's dementia, systolic CHF, A-fib on Eliquis, marginal zone lymphoma, CKD-3B, HTN and anxiety brought to ED after unwitnessed fall and left hip fracture, and admitted.  X-ray confirmed left hip fracture.  CT head and neck without acute finding.   Patient underwent left hip cephalomedullary nail by Dr. Sammuel Hines on 04/14/2022.  Patient had postoperative blood loss anemia with hemoglobin dropping from 13.1 to 9.9 on postop day 1.  She also had IV fluid that could have caused hemodilution.  Anemia panel with mild iron and folic acid deficiency.  H&H stable after initial drop.  Discharged on folic acid and multivitamin.  AKI resolved.  Lasix reduced to 20 mg daily due to risk of dehydration and AKI.  See individual problem list below for more.   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 Sammuel Hines on 3/1 -Tylenol 650 mg every 6 hours while awake.  Tramadol for moderate and severe pain -Already on low-dose Eliquis for A-fib which will serve VTE prophylaxis -PT/OT-she is WBAT on operative extremity-recommended SNF   Chronic systolic CHF: TTE in [id number] with LVEF of 30 to 35%, GH, severe LAE, severe RAE, moderate MVR and severe TVR.  Appears dry on exam.  On p.o. Lasix 40 mg daily at home. -Resumed home Lasix at 20 mg daily -Strict intake and output, daily weight, renal functions and electrolytes   Persistent A-fib: Rate controlled. -Continue home amiodarone and low-dose Eliquis   AKI on CKD-3B: Cr improved today. Recent Labs    06/16/21 1511 07/10/21 1019 12/16/21 1510 04/14/22 0448 04/15/22 0700 04/16/22 0801 04/17/22 0421  BUN 24* '17 20 21 23 '$ 24* 25*  CREATININE 1.53* 1.34* 1.51* 1.52* 1.78* 1.40* 1.22*  -Recheck renal function in 1 week.  Acute postoperative blood loss anemia: Hgb dropped from 13.1-9.9 on POD-1 although reported EBL is 50 cc.  She has been on IV fluids for AKI which might have caused hemodilution as well.  No signs of bleeding at surgical site.  Anemia  panel with mild iron and folic acid deficiency. Recent Labs    06/16/21 1511 07/10/21 1019 12/16/21 1510 04/14/22 0448 04/15/22 0700 04/15/22 1638 04/16/22 0801 04/17/22 0421 04/17/22 1032  HGB 13.4 14.3 13.6 13.1 9.9* 10.3* 9.4* 8.7* 9.2*  -Continue multivitamin and folic acid -Recheck CBC in 1 week  Mixed Alzheimer's and vascular dementia:  She is awake and alert but disoriented.  No behavioral  disturbance. -Reorientation and delirium precautions -Continue home Namenda, Lexapro and BuSpar.  Pharmacy verified that patient is on Lexapro 10 mg daily based on recent fill   Marginal zone lymphoma of the intra-abdominal lymph nodes: Followed by Dr. Marin Olp.  Currently not on treatment -Outpatient follow-up.   Elevated alkaline phosphatase: Mild.   Goal of care/DNR-confirmed.   Nutrition Problem: Increased nutrient needs Etiology: hip fracture Signs/Symptoms: estimated needs       Vital signs Vitals:   04/17/22 0851 04/17/22 1335 04/17/22 2013 04/18/22 0400  BP: 107/64 109/66 133/71 121/64  Pulse: 88 79 76 78  Temp: 97.7 F (36.5 C) 98.3 F (36.8 C) 98 F (36.7 C) 97.9 F (36.6 C)  Resp: '18 18 14 18  '$ Height:      Weight:      SpO2: 100% 98% 99% 97%  TempSrc: Oral Oral Oral Oral  BMI (Calculated):         Discharge exam  GENERAL: Appears frail.  No apparent distress. HEENT: MMM.  Vision and hearing grossly intact.  NECK: Supple.  No apparent JVD.  RESP:  No IWOB.  Fair aeration bilaterally. CVS:  RRR. Heart sounds normal.  ABD/GI/GU: BS+. Abd soft, NTND.  MSK/EXT:  Moves extremities. No apparent deformity. No edema.  SKIN: Dressing over surgical site DCI. NEURO: Awake and alert. Oriented only to self.  Follows commands.  No apparent focal neuro deficit. PSYCH: Calm. Normal affect.   Discharge Instructions Discharge Instructions     Diet general   Complete by: As directed    Increase activity slowly   Complete by: As directed       Allergies as of 04/18/2022       Reactions   Shellfish Allergy Itching, Swelling   Shrimp        Medication List     STOP taking these medications    cyanocobalamin 1000 MCG tablet   methocarbamol 500 MG tablet Commonly known as: ROBAXIN       TAKE these medications    acetaminophen 325 MG tablet Commonly known as: TYLENOL Take 2 tablets (650 mg total) by mouth every 6 (six) hours for 5 days, THEN 2 tablets  (650 mg total) every 6 (six) hours as needed for up to 25 days. Start taking on: April 17, 2022 What changed: See the new instructions.   amiodarone 200 MG tablet Commonly known as: PACERONE Take 0.5 tablets (100 mg total) by mouth daily.   busPIRone 10 MG tablet Commonly known as: BUSPAR Take 10 mg by mouth 3 (three) times daily. What changed: Another medication with the same name was removed. Continue taking this medication, and follow the directions you see here.   docusate sodium 100 MG capsule Commonly known as: COLACE Take 1 capsule (100 mg total) by mouth 2 (two) times daily.   Eliquis 2.5 MG Tabs tablet Generic drug: apixaban TAKE 1 TABLET BY MOUTH TWICE A DAY What changed: how much to take   escitalopram 10 MG tablet Commonly known as: LEXAPRO Take 1 tablet (10 mg total) by mouth every morning. What changed:  medication strength how much to take   feeding supplement Liqd Take 237 mLs by mouth 2 (two) times daily between meals.   folic acid 1 MG tablet Commonly known as: FOLVITE Take 1 tablet (1 mg total) by mouth daily.   furosemide 20 MG tablet Commonly known as: LASIX Take 1 tablet (20 mg total) by mouth daily. What changed:  medication strength how much to take   memantine 10 MG tablet Commonly known as: NAMENDA Take 10 mg by mouth 2 (two) times daily. For memory   midodrine 10 MG tablet Commonly known as: PROAMATINE Take 1 tablet (10 mg total) by mouth 3 (three) times daily with meals.   multivitamin with minerals Tabs tablet Take 1 tablet by mouth daily.   NON FORMULARY Fluid restriction of 1200 mL   polyethylene glycol 17 g packet Commonly known as: MIRALAX / GLYCOLAX Take 17 g by mouth daily. What changed: Another medication with the same name was added. Make sure you understand how and when to take each.   polyethylene glycol powder 17 GM/SCOOP powder Commonly known as: MiraLax Take 17 g by mouth 2 (two) times daily as needed for  moderate constipation. What changed: You were already taking a medication with the same name, and this prescription was added. Make sure you understand how and when to take each.   senna-docusate 8.6-50 MG tablet Commonly known as: Senokot-S Take 1 tablet by mouth daily.   traMADol 50 MG tablet Commonly known as: ULTRAM Take 0.5 tablets (25 mg total) by mouth 2 (two) times daily as needed for up to 14 days for moderate pain or severe pain. What changed: reasons to take this        Consultations: Orthopedic surgery  Procedures/Studies: 3/1-left hip cephalomedullary nail by Dr. Kathlynn Grate FEMUR MIN 2 VIEWS LEFT  Result Date: 04/14/2022 CLINICAL DATA:  Fluoroscopic assistance for internal fixation of left femur EXAM: LEFT FEMUR 2 VIEWS COMPARISON:  Radiographs done earlier today FINDINGS: There is interval reduction and internal fixation of comminuted intertrochanteric fracture of left femur with intramedullary rod. Fluoroscopic time 180.6 seconds. Radiation dose 10.65 mGy. IMPRESSION: Fluoroscopic assistance was provided for reduction and internal fixation of intertrochanteric fracture of left femur. Electronically Signed   By: Elmer Picker M.D.   On: 04/14/2022 17:46   DG C-Arm 1-60 Min-No Report  Result Date: 04/14/2022 Fluoroscopy was utilized by the requesting physician.  No radiographic interpretation.   CT HEAD WO CONTRAST  Result Date: 04/14/2022 CLINICAL DATA:  Hip fracture EXAM: CT HEAD WITHOUT CONTRAST CT CERVICAL SPINE WITHOUT CONTRAST TECHNIQUE: Multidetector CT imaging of the head and cervical spine was performed following the standard protocol without intravenous contrast. Multiplanar CT image reconstructions of the cervical spine were also generated. RADIATION DOSE REDUCTION: This exam was performed according to the departmental dose-optimization program which includes automated exposure control, adjustment of the mA and/or kV according to patient size and/or use  of iterative reconstruction technique. COMPARISON:  01/17/2022 head CT FINDINGS: CT HEAD FINDINGS Brain: No evidence of acute infarction, hemorrhage, hydrocephalus, extra-axial collection or mass lesion/mass effect. Pronounced brain atrophy. Confluent chronic small vessel ischemia in the cerebral white matter. Small chronic left cerebellar infarction. Vascular: No hyperdense vessel or unexpected calcification. Skull: Normal. Negative for fracture or focal lesion. Bilateral TMJ degenerative arthropathy. Sinuses/Orbits: No evidence of injury CT CERVICAL SPINE FINDINGS Alignment: No traumatic malalignment. Mild degenerative anterolisthesis C2-3. Skull base and vertebrae: No acute fracture. No primary bone lesion or focal pathologic  process. Generalized osteopenia Soft tissues and spinal canal: No prevertebral fluid or swelling. No visible canal hematoma. Disc levels: Ordinary and generalized cervical spine degeneration. Notable foraminal narrowing at C2-3 and C3-4. Upper chest: Clear apical lungs IMPRESSION: 1. No evidence of acute intracranial or cervical spine injury. 2. Advanced brain atrophy and chronic small vessel disease. Electronically Signed   By: Jorje Guild M.D.   On: 04/14/2022 05:09   CT CERVICAL SPINE WO CONTRAST  Result Date: 04/14/2022 CLINICAL DATA:  Hip fracture EXAM: CT HEAD WITHOUT CONTRAST CT CERVICAL SPINE WITHOUT CONTRAST TECHNIQUE: Multidetector CT imaging of the head and cervical spine was performed following the standard protocol without intravenous contrast. Multiplanar CT image reconstructions of the cervical spine were also generated. RADIATION DOSE REDUCTION: This exam was performed according to the departmental dose-optimization program which includes automated exposure control, adjustment of the mA and/or kV according to patient size and/or use of iterative reconstruction technique. COMPARISON:  01/17/2022 head CT FINDINGS: CT HEAD FINDINGS Brain: No evidence of acute infarction,  hemorrhage, hydrocephalus, extra-axial collection or mass lesion/mass effect. Pronounced brain atrophy. Confluent chronic small vessel ischemia in the cerebral white matter. Small chronic left cerebellar infarction. Vascular: No hyperdense vessel or unexpected calcification. Skull: Normal. Negative for fracture or focal lesion. Bilateral TMJ degenerative arthropathy. Sinuses/Orbits: No evidence of injury CT CERVICAL SPINE FINDINGS Alignment: No traumatic malalignment. Mild degenerative anterolisthesis C2-3. Skull base and vertebrae: No acute fracture. No primary bone lesion or focal pathologic process. Generalized osteopenia Soft tissues and spinal canal: No prevertebral fluid or swelling. No visible canal hematoma. Disc levels: Ordinary and generalized cervical spine degeneration. Notable foraminal narrowing at C2-3 and C3-4. Upper chest: Clear apical lungs IMPRESSION: 1. No evidence of acute intracranial or cervical spine injury. 2. Advanced brain atrophy and chronic small vessel disease. Electronically Signed   By: Jorje Guild M.D.   On: 04/14/2022 05:09   DG Chest Port 1 View  Result Date: 04/14/2022 CLINICAL DATA:  Level 2 trauma.  Fall on blood thinners EXAM: PORTABLE CHEST 1 VIEW COMPARISON:  02/13/2021 FINDINGS: Cardiomegaly.  Stable mediastinal contours. There is no edema, consolidation, effusion, or pneumothorax. Healed left clavicle fracture. IMPRESSION: No active disease. Electronically Signed   By: Jorje Guild M.D.   On: 04/14/2022 04:49   DG Hip Unilat W or Wo Pelvis 2-3 Views Left  Result Date: 04/14/2022 CLINICAL DATA:  Level 2 trauma EXAM: DG HIP (WITH OR WITHOUT PELVIS) 2V LEFT COMPARISON:  02/13/2021 FINDINGS: Acute intertrochanteric left femur fracture. Fracture displacement and varus angulation. The hip is located. Prior right femur fracture and repair. No evidence of pelvic ring fracture or diastasis. Generalized osteopenia. IMPRESSION: Acute and angulated intertrochanteric left  femur fracture. Electronically Signed   By: Jorje Guild M.D.   On: 04/14/2022 04:48       The results of significant diagnostics from this hospitalization (including imaging, microbiology, ancillary and laboratory) are listed below for reference.     Microbiology: Recent Results (from the past 240 hour(s))  MRSA Next Gen by PCR, Nasal     Status: None   Collection Time: 04/14/22  1:17 PM   Specimen: Nasal Mucosa; Nasal Swab  Result Value Ref Range Status   MRSA by PCR Next Gen NOT DETECTED NOT DETECTED Final    Comment: (NOTE) The GeneXpert MRSA Assay (FDA approved for NASAL specimens only), is one component of a comprehensive MRSA colonization surveillance program. It is not intended to diagnose MRSA infection nor to guide or monitor treatment  for MRSA infections. Test performance is not FDA approved in patients less than 24 years old. Performed at [facility], [place] [address] [place], [place] [address]      Labs:  CBC: Recent Labs  Lab 04/15/22 0700 04/15/22 1638 04/16/22 0801 04/17/22 0421 04/17/22 1032  WBC 7.0 10.2 5.7 6.2 6.5  HGB 9.9* 10.3* 9.4* 8.7* 9.2*  HCT 31.0* 33.0* 30.3* 27.5* 28.5*  MCV 94.5 95.1 95.3 96.2 96.0  PLT 226 276 225 218 234   BMP &GFR Recent Labs  Lab 04/14/22 0448 04/15/22 0700 04/16/22 0801 04/17/22 0421  NA 136 136 139 138  K 4.2 4.5 3.9 4.4  CL 99 103 107 105  CO2 '22 23 27 25  '$ GLUCOSE 117* 136* 86 87  BUN 21 23 24* 25*  CREATININE 1.52* 1.78* 1.40* 1.22*  CALCIUM 9.1 8.6* 8.4* 8.4*  MG  --  2.3 2.3 2.3  PHOS  --   --  2.9 2.5   Estimated Creatinine Clearance: 22.6 mL/min (A) (by C-G formula based on SCr of 1.22 mg/dL (H)). Liver & Pancreas: Recent Labs  Lab 04/14/22 0448 04/16/22 0801 04/17/22 0421  AST 25  --   --   ALT 9  --   --   ALKPHOS 141*  --   --   BILITOT 0.8  --   --   PROT 6.7  --   --   ALBUMIN 3.5 2.7* 2.4*   No results for input(s): "LIPASE", "AMYLASE" in the last 168 hours. No results  for input(s): "AMMONIA" in the last 168 hours. Diabetic: No results for input(s): "HGBA1C" in the last 72 hours. No results for input(s): "GLUCAP" in the last 168 hours. Cardiac Enzymes: No results for input(s): "CKTOTAL", "CKMB", "CKMBINDEX", "TROPONINI" in the last 168 hours. No results for input(s): "PROBNP" in the last 8760 hours. Coagulation Profile: Recent Labs  Lab 04/14/22 0457  INR 1.1   Thyroid Function Tests: No results for input(s): "TSH", "T4TOTAL", "FREET4", "T3FREE", "THYROIDAB" in the last 72 hours. Lipid Profile: No results for input(s): "CHOL", "HDL", "LDLCALC", "TRIG", "CHOLHDL", "LDLDIRECT" in the last 72 hours. Anemia Panel: Recent Labs    04/16/22 0800 04/16/22 0801  VITAMINB12  --  1,106*  FOLATE  --  4.2*  FERRITIN  --  91  TIBC  --  260  IRON  --  26*  RETICCTPCT 1.9  --    Urine analysis:    Component Value Date/Time   COLORURINE YELLOW 10/22/2019 1537   APPEARANCEUR CLEAR 10/22/2019 1537   LABSPEC 1.025 10/22/2019 1537   PHURINE 5.5 10/22/2019 1537   GLUCOSEU NEGATIVE 10/22/2019 1537   HGBUR TRACE (A) 10/22/2019 1537   BILIRUBINUR NEGATIVE 10/22/2019 1537   KETONESUR NEGATIVE 10/22/2019 1537   PROTEINUR NEGATIVE 10/22/2019 1537   NITRITE NEGATIVE 10/22/2019 1537   LEUKOCYTESUR SMALL (A) 10/22/2019 1537   Sepsis Labs: Invalid input(s): "PROCALCITONIN", "LACTICIDVEN"   SIGNED:  Mercy Riding, MD  [facility] 04/18/2022, 7:01 AM

## 2022-04-19 ENCOUNTER — Encounter (HOSPITAL_COMMUNITY): Payer: Self-pay | Admitting: Orthopaedic Surgery

## 2022-04-28 ENCOUNTER — Ambulatory Visit (INDEPENDENT_AMBULATORY_CARE_PROVIDER_SITE_OTHER): Payer: Medicare PPO

## 2022-04-28 ENCOUNTER — Ambulatory Visit (INDEPENDENT_AMBULATORY_CARE_PROVIDER_SITE_OTHER): Payer: Medicare PPO | Admitting: Orthopaedic Surgery

## 2022-04-28 DIAGNOSIS — S72142A Displaced intertrochanteric fracture of left femur, initial encounter for closed fracture: Secondary | ICD-10-CM

## 2022-04-28 DIAGNOSIS — S72141A Displaced intertrochanteric fracture of right femur, initial encounter for closed fracture: Secondary | ICD-10-CM

## 2022-04-28 NOTE — Progress Notes (Signed)
Post Operative Evaluation    Procedure/Date of Surgery: Left hip cephalomedullary nailing 04/14/22  Interval History:   Presents today for follow-up of the above procedure.  Overall she is having a somewhat difficult time mobilizing at her rehab.  She has been able to put some weight on the left leg.  She is here today for further assessment.   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 EF > 55% by 06/16/15 echo.   ELBOW BURSA SURGERY Right 2016   EYE SURGERY     cataracts removed ,both eyes, /w IOL   INTRAMEDULLARY (IM) NAIL INTERTROCHANTERIC Right 02/14/2021   Procedure: INTRAMEDULLARY (IM) NAIL INTERTROCHANTRIC;  Surgeon: Vanetta Mulders, MD;  Location: [facility];  Service: Orthopedics;  Laterality: Right;   LYMPH NODE BIOPSY Left 01/14/2018   Procedure: EXCISION DEEP LEFT INGUINAL LYMPH NODE BIOPSY ERAS PATHWAY;  Surgeon: Fanny Skates, MD;  Location: [facility];  Service: General;  Laterality: Left;   TONSILLECTOMY     Social History   Socioeconomic History   Marital status: Widowed    Spouse name: Not on file   Number of children: Not on file   Years of education: Not on file   Highest education level: Not on file  Occupational History   Not on file  Tobacco Use   Smoking status: Never   Smokeless tobacco: Never   Tobacco comments:    never used tobacco  Vaping Use   Vaping Use: Never used  Substance and Sexual Activity   Alcohol use: Yes    Alcohol/week: 1.0 standard  drink    Types: 1 Glasses of wine per week   Drug use: No   Sexual activity: Not on file  Other Topics Concern   Not on file  Social History Narrative   Not on file   Social Determinants of Health   Financial Resource Strain: Not on file  Food Insecurity: Not on file  Transportation Needs: Not on file  Physical Activity: Not on file  Stress: Not on file  Social Connections: Not on file   Family History  Problem Relation Age of Onset   Heart disease Mother    Depression Mother    Heart failure Mother    Heart disease Father    Alcohol abuse Father    Heart attack Father    Depression Father    Multiple myeloma Maternal Grandmother    Alcohol abuse Maternal Grandfather    Breast cancer Paternal Grandmother    Breast cancer Daughter    Allergies  Allergen Reactions   Shellfish Allergy Itching and Swelling    Shrimp    Current Outpatient Medications  Medication Sig Dispense Refill   acetaminophen (TYLENOL) 500 MG tablet Take 1,000 mg by mouth 3 (three) times daily.     amiodarone (PACERONE) 200 MG tablet Take  1 tablet (200 mg total) by mouth daily. 30 tablet 0   apixaban (ELIQUIS) 2.5 MG TABS tablet TAKE 1 TABLET BY MOUTH TWICE A DAY (Patient taking differently: Take 2.5 mg by mouth 2 (two) times daily.) 60 tablet 5   atorvastatin (LIPITOR) 20 MG tablet Take 20 mg by mouth at bedtime.     busPIRone (BUSPAR) 5 MG tablet Take 1 tablet (5 mg total) by mouth 2 (two) times daily. 30 tablet 0   cyanocobalamin 1000 MCG tablet Take 1,000 mcg by mouth every morning.     docusate sodium (COLACE) 100 MG capsule Take 1 capsule (100 mg total) by mouth 2 (two) times daily. 30 capsule 0   escitalopram (LEXAPRO) 5 MG tablet Take 5 mg by mouth every morning.     fluocinonide ointment (LIDEX) [id number] % Apply 1 application topically See admin instructions. Order date 02/02/21 - apply topically to scalp twice daily Monday thru Friday until healed     furosemide (LASIX) 40 MG tablet Take 1  tablet (40 mg total) by mouth daily. 30 tablet 0   memantine (NAMENDA) 10 MG tablet Take 10 mg by mouth 2 (two) times daily. For memory     methocarbamol (ROBAXIN) 500 MG tablet Take 1 tablet (500 mg total) by mouth every 6 (six) hours as needed for muscle spasms. 30 tablet 0   midodrine (PROAMATINE) 10 MG tablet Take 1 tablet (10 mg total) by mouth 3 (three) times daily with meals. 30 tablet 0   polyethylene glycol (MIRALAX / GLYCOLAX) 17 g packet Take 17 g by mouth daily. 14 each 0   QUEtiapine (SEROQUEL) 25 MG tablet Take 1 tablet (25 mg total) by mouth at bedtime. 10 tablet 0   No current facility-administered medications for this visit.   No results found.  Review of Systems:   A ROS was performed including pertinent positives and negatives as documented in the HPI.   Musculoskeletal Exam:    There were no vitals taken for this visit.  Left hip incision is well-appearing with a skin tear about the distal wound which is granulating.  She is able to flex at the right hip 20 degrees while sitting in the chair.  Internal rotation of 30 as well as external rotation and 30 degrees of right hip without pain walks without antalgia.  Imaging:    X-rays 2 views of right femur: Status post cephalomedullary nailing with interval callus formation and healing  I personally reviewed and interpreted the radiographs.   Assessment:   89 year old female status post left hip cephalomedullary nailing.  Today's visit I would like her to continue to work with physical therapy to progress her gait and ambulation status.  I will plan to see her back in 6 weeks for reassessment Plan :    -Return to clinic in 6 weeks     I personally saw and evaluated the patient, and participated in the management and treatment plan.  Vanetta Mulders, MD Attending Physician, Orthopedic Surgery  This document was dictated using Dragon voice recognition software. A reasonable attempt at proof reading has been made  to minimize errors.

## 2022-06-14 ENCOUNTER — Encounter (HOSPITAL_BASED_OUTPATIENT_CLINIC_OR_DEPARTMENT_OTHER): Payer: Medicare PPO | Admitting: Orthopaedic Surgery

## 2022-06-21 ENCOUNTER — Encounter (HOSPITAL_BASED_OUTPATIENT_CLINIC_OR_DEPARTMENT_OTHER): Payer: Medicare PPO | Admitting: Orthopaedic Surgery

## 2022-07-05 ENCOUNTER — Encounter (HOSPITAL_BASED_OUTPATIENT_CLINIC_OR_DEPARTMENT_OTHER): Payer: Medicare PPO | Admitting: Orthopaedic Surgery

## 2022-07-12 ENCOUNTER — Ambulatory Visit (INDEPENDENT_AMBULATORY_CARE_PROVIDER_SITE_OTHER): Payer: Medicare PPO | Admitting: Orthopaedic Surgery

## 2022-07-12 ENCOUNTER — Ambulatory Visit (INDEPENDENT_AMBULATORY_CARE_PROVIDER_SITE_OTHER): Payer: Medicare PPO

## 2022-07-12 DIAGNOSIS — S72142A Displaced intertrochanteric fracture of left femur, initial encounter for closed fracture: Secondary | ICD-10-CM | POA: Diagnosis not present

## 2022-07-12 NOTE — Progress Notes (Signed)
Post Operative Evaluation    Procedure/Date of Surgery: Left hip cephalomedullary nailing 04/14/22  Interval History:   Presents today for follow-up of the above procedure.  At today's visit she is still having difficulty ambulating and putting weight on the left hip.  She is now in a memory care unit of her nursing home.  She has had limited ambulation although is not having pain at rest.   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 EF > 55% by 06/16/15 echo.   ELBOW BURSA SURGERY Right 2016   EYE SURGERY     cataracts removed ,both eyes, /w IOL   INTRAMEDULLARY (IM) NAIL INTERTROCHANTERIC Right 02/14/2021   Procedure: INTRAMEDULLARY (IM) NAIL INTERTROCHANTRIC;  Surgeon: Huel Cote, MD;  Location: [facility] OR;  Service: Orthopedics;  Laterality: Right;   INTRAMEDULLARY (IM) NAIL INTERTROCHANTERIC Left 04/14/2022   Procedure: INTRAMEDULLARY (IM) NAIL INTERTROCHANTERIC;  Surgeon: Huel Cote, MD;  Location: [facility] OR;  Service: Orthopedics;  Laterality: Left;   LYMPH NODE BIOPSY Left 01/14/2018   Procedure: EXCISION DEEP LEFT INGUINAL LYMPH NODE BIOPSY ERAS PATHWAY;  Surgeon: Claud Kelp, MD;  Location: [facility] OR;  Service: General;  Laterality: Left;   TONSILLECTOMY     Social History   Socioeconomic History   Marital status: Widowed    Spouse name: Not on file   Number of children: Not on file   Years of education: Not on file   Highest education level: Not on file   Occupational History   Not on file  Tobacco Use   Smoking status: Never   Smokeless tobacco: Never   Tobacco comments:    never used tobacco  Vaping Use   Vaping Use: Never used  Substance and Sexual Activity   Alcohol use: Yes    Alcohol/week: 1.0 standard drink of alcohol    Types: 1 Glasses of wine per week   Drug use: No   Sexual activity: Not on file  Other Topics Concern   Not on file  Social History Narrative   Not on file   Social Determinants of Health   Financial Resource Strain: Not on file  Food Insecurity: No Food Insecurity (04/14/2022)   Hunger Vital Sign    Worried About Running Out of Food in the Last Year: Never true    Ran Out of Food in the Last Year: Never true  Transportation Needs: No Transportation Needs (04/14/2022)   PRAPARE - Administrator, Civil Service (Medical): No    Lack of Transportation (Non-Medical): No  Physical Activity: Not on file  Stress: Not on file  Social Connections: Not on file   Family History  Problem Relation Age of Onset   Heart disease Mother    Depression Mother    Heart failure Mother    Heart disease Father  Alcohol abuse Father    Heart attack Father    Depression Father    Multiple myeloma Maternal Grandmother    Alcohol abuse Maternal Grandfather    Breast cancer Paternal Grandmother    Breast cancer Daughter    Allergies  Allergen Reactions   Shellfish Allergy Itching and Swelling    Shrimp    Current Outpatient Medications  Medication Sig Dispense Refill   amiodarone (PACERONE) 200 MG tablet Take 0.5 tablets (100 mg total) by mouth daily. 45 tablet 3   apixaban (ELIQUIS) 2.5 MG TABS tablet TAKE 1 TABLET BY MOUTH TWICE A DAY (Patient taking differently: Take 2.5 mg by mouth 2 (two) times daily.) 60 tablet 5   busPIRone (BUSPAR) 10 MG tablet Take 10 mg by mouth 3 (three) times daily.     docusate sodium (COLACE) 100 MG capsule Take 1 capsule (100 mg total) by mouth 2 (two) times daily. 30  capsule 0   escitalopram (LEXAPRO) 10 MG tablet Take 1 tablet (10 mg total) by mouth every morning.     feeding supplement (ENSURE ENLIVE / ENSURE PLUS) LIQD Take 237 mLs by mouth 2 (two) times daily between meals. 237 mL 12   folic acid (FOLVITE) 1 MG tablet Take 1 tablet (1 mg total) by mouth daily.     furosemide (LASIX) 20 MG tablet Take 1 tablet (20 mg total) by mouth daily.     memantine (NAMENDA) 10 MG tablet Take 10 mg by mouth 2 (two) times daily. For memory     midodrine (PROAMATINE) 10 MG tablet Take 1 tablet (10 mg total) by mouth 3 (three) times daily with meals. 30 tablet 0   Multiple Vitamin (MULTIVITAMIN WITH MINERALS) TABS tablet Take 1 tablet by mouth daily.     NON FORMULARY Fluid restriction of 1200 mL     polyethylene glycol (MIRALAX / GLYCOLAX) 17 g packet Take 17 g by mouth daily. 14 each 0   polyethylene glycol powder (MIRALAX) 17 GM/SCOOP powder Take 17 g by mouth 2 (two) times daily as needed for moderate constipation. 255 g 0   senna-docusate (SENOKOT-S) 8.6-50 MG tablet Take 1 tablet by mouth daily.     No current facility-administered medications for this visit.   No results found.  Review of Systems:   A ROS was performed including pertinent positives and negatives as documented in the HPI.   Musculoskeletal Exam:    There were no vitals taken for this visit.  Left hip incision is well-appearing with a skin tear about the distal wound which is granulating.  She is able to flex at the right hip 20 degrees while sitting in the chair.  Internal rotation of 30 as well as external rotation and 30 degrees of right hip without pain walks without antalgia.  Imaging:    X-rays 2 views of right femur: Status post cephalomedullary nailing with interval callus formation and healing  I personally reviewed and interpreted the radiographs.   Assessment:   89 year old female status post left hip cephalomedullary nailing.  Overall x-rays do show some interval callus.   At this time she will be activity as tolerated and I will plan to see her as needed Plan :    -Return to clinic as needed     I personally saw and evaluated the patient, and participated in the management and treatment plan.  Huel Cote, MD Attending Physician, Orthopedic Surgery  This document was dictated using Dragon voice recognition software. A reasonable attempt at proof  reading has been made to minimize errors.

## 2022-09-14 DEATH — deceased
